# Patient Record
Sex: Female | Born: 1965
Health system: Southern US, Community
[De-identification: ages and names within clinical notes are randomized; demographics above are authoritative.]

## PROBLEM LIST (undated history)

## (undated) DIAGNOSIS — G4733 Obstructive sleep apnea (adult) (pediatric): Secondary | ICD-10-CM

## (undated) DIAGNOSIS — F329 Major depressive disorder, single episode, unspecified: Secondary | ICD-10-CM

## (undated) DIAGNOSIS — J302 Other seasonal allergic rhinitis: Secondary | ICD-10-CM

## (undated) DIAGNOSIS — K219 Gastro-esophageal reflux disease without esophagitis: Secondary | ICD-10-CM

## (undated) DIAGNOSIS — Z8739 Personal history of other diseases of the musculoskeletal system and connective tissue: Secondary | ICD-10-CM

## (undated) DIAGNOSIS — I4891 Unspecified atrial fibrillation: Secondary | ICD-10-CM

## (undated) DIAGNOSIS — F419 Anxiety disorder, unspecified: Secondary | ICD-10-CM

## (undated) DIAGNOSIS — J449 Chronic obstructive pulmonary disease, unspecified: Secondary | ICD-10-CM

## (undated) DIAGNOSIS — M549 Dorsalgia, unspecified: Secondary | ICD-10-CM

## (undated) DIAGNOSIS — I209 Angina pectoris, unspecified: Secondary | ICD-10-CM

## (undated) DIAGNOSIS — M542 Cervicalgia: Secondary | ICD-10-CM

## (undated) DIAGNOSIS — K589 Irritable bowel syndrome without diarrhea: Secondary | ICD-10-CM

## (undated) DIAGNOSIS — M25572 Pain in left ankle and joints of left foot: Secondary | ICD-10-CM

## (undated) DIAGNOSIS — M797 Fibromyalgia: Secondary | ICD-10-CM

## (undated) DIAGNOSIS — I10 Essential (primary) hypertension: Secondary | ICD-10-CM

## (undated) DIAGNOSIS — I499 Cardiac arrhythmia, unspecified: Secondary | ICD-10-CM

## (undated) DIAGNOSIS — R0600 Dyspnea, unspecified: Secondary | ICD-10-CM

## (undated) DIAGNOSIS — M7918 Myalgia, other site: Secondary | ICD-10-CM

## (undated) DIAGNOSIS — M5432 Sciatica, left side: Secondary | ICD-10-CM

## (undated) DIAGNOSIS — T8859XA Other complications of anesthesia, initial encounter: Secondary | ICD-10-CM

## (undated) DIAGNOSIS — D649 Anemia, unspecified: Secondary | ICD-10-CM

## (undated) DIAGNOSIS — Z87442 Personal history of urinary calculi: Secondary | ICD-10-CM

## (undated) DIAGNOSIS — R5382 Chronic fatigue, unspecified: Secondary | ICD-10-CM

## (undated) DIAGNOSIS — F32A Depression, unspecified: Secondary | ICD-10-CM

## (undated) DIAGNOSIS — M069 Rheumatoid arthritis, unspecified: Secondary | ICD-10-CM

## (undated) DIAGNOSIS — R51 Headache: Secondary | ICD-10-CM

## (undated) DIAGNOSIS — R079 Chest pain, unspecified: Secondary | ICD-10-CM

## (undated) DIAGNOSIS — M7750 Other enthesopathy of unspecified foot: Secondary | ICD-10-CM

## (undated) DIAGNOSIS — R7303 Prediabetes: Secondary | ICD-10-CM

## (undated) DIAGNOSIS — R002 Palpitations: Secondary | ICD-10-CM

## (undated) DIAGNOSIS — R519 Headache, unspecified: Secondary | ICD-10-CM

## (undated) DIAGNOSIS — M2550 Pain in unspecified joint: Secondary | ICD-10-CM

## (undated) DIAGNOSIS — F988 Other specified behavioral and emotional disorders with onset usually occurring in childhood and adolescence: Secondary | ICD-10-CM

## (undated) DIAGNOSIS — J45909 Unspecified asthma, uncomplicated: Secondary | ICD-10-CM

## (undated) DIAGNOSIS — G9332 Myalgic encephalomyelitis/chronic fatigue syndrome: Secondary | ICD-10-CM

## (undated) HISTORY — PX: LAPAROSCOPIC CHOLECYSTECTOMY: SUR755

## (undated) HISTORY — DX: Depression, unspecified: F32.A

## (undated) HISTORY — DX: Major depressive disorder, single episode, unspecified: F32.9

## (undated) HISTORY — PX: KNEE ARTHROSCOPY: SUR90

## (undated) HISTORY — PX: TONSILLECTOMY: SUR1361

## (undated) HISTORY — DX: Anemia, unspecified: D64.9

## (undated) HISTORY — DX: Pain in left ankle and joints of left foot: M25.572

## (undated) HISTORY — DX: Palpitations: R00.2

## (undated) HISTORY — DX: Other enthesopathy of unspecified foot and ankle: M77.50

## (undated) HISTORY — DX: Pain in unspecified joint: M25.50

## (undated) HISTORY — DX: Cervicalgia: M54.2

## (undated) HISTORY — DX: Obstructive sleep apnea (adult) (pediatric): G47.33

## (undated) HISTORY — PX: BILATERAL KNEE ARTHROSCOPY: SUR91

## (undated) HISTORY — DX: Myalgia, other site: M79.18

## (undated) HISTORY — PX: CYST EXCISION: SHX5701

## (undated) HISTORY — DX: Unspecified atrial fibrillation: I48.91

## (undated) HISTORY — DX: Anxiety disorder, unspecified: F41.9

## (undated) HISTORY — DX: Dorsalgia, unspecified: M54.9

## (undated) HISTORY — DX: Unspecified asthma, uncomplicated: J45.909

## (undated) HISTORY — PX: KNEE SURGERY: SHX244

## (undated) HISTORY — DX: Chest pain, unspecified: R07.9

## (undated) HISTORY — DX: Dyspnea, unspecified: R06.00

## (undated) HISTORY — DX: Essential (primary) hypertension: I10

## (undated) HISTORY — DX: Fibromyalgia: M79.7

## (undated) HISTORY — PX: CARPAL TUNNEL RELEASE: SHX101

## (undated) HISTORY — DX: Irritable bowel syndrome, unspecified: K58.9

## (undated) HISTORY — DX: Other specified behavioral and emotional disorders with onset usually occurring in childhood and adolescence: F98.8

---

## 2014-12-06 ENCOUNTER — Observation Stay (HOSPITAL_COMMUNITY)
Admission: EM | Admit: 2014-12-06 | Discharge: 2014-12-10 | Disposition: A | Discharge status: Home / Self Care | Payer: Medicare Other | Attending: Internal Medicine | Admitting: Internal Medicine

## 2014-12-06 ENCOUNTER — Encounter (HOSPITAL_COMMUNITY): Payer: Self-pay | Admitting: *Deleted

## 2014-12-06 DIAGNOSIS — Z6838 Body mass index (BMI) 38.0-38.9, adult: Secondary | ICD-10-CM | POA: Insufficient documentation

## 2014-12-06 DIAGNOSIS — Z7982 Long term (current) use of aspirin: Secondary | ICD-10-CM | POA: Diagnosis not present

## 2014-12-06 DIAGNOSIS — E669 Obesity, unspecified: Secondary | ICD-10-CM | POA: Insufficient documentation

## 2014-12-06 DIAGNOSIS — R079 Chest pain, unspecified: Secondary | ICD-10-CM | POA: Diagnosis not present

## 2014-12-06 DIAGNOSIS — Z885 Allergy status to narcotic agent status: Secondary | ICD-10-CM | POA: Diagnosis not present

## 2014-12-06 DIAGNOSIS — Z791 Long term (current) use of non-steroidal anti-inflammatories (NSAID): Secondary | ICD-10-CM | POA: Insufficient documentation

## 2014-12-06 DIAGNOSIS — Z6837 Body mass index (BMI) 37.0-37.9, adult: Secondary | ICD-10-CM | POA: Insufficient documentation

## 2014-12-06 DIAGNOSIS — Z91040 Latex allergy status: Secondary | ICD-10-CM | POA: Insufficient documentation

## 2014-12-06 DIAGNOSIS — D649 Anemia, unspecified: Secondary | ICD-10-CM | POA: Insufficient documentation

## 2014-12-06 DIAGNOSIS — M069 Rheumatoid arthritis, unspecified: Secondary | ICD-10-CM

## 2014-12-06 DIAGNOSIS — Z8249 Family history of ischemic heart disease and other diseases of the circulatory system: Secondary | ICD-10-CM | POA: Diagnosis not present

## 2014-12-06 NOTE — ED Notes (Signed)
PT states that she began to have left sided chest pain approx 20 min ago; pt states that she felt short of breath and the pain radiated to left arm / shoulder area and to the back; pt family gave 1 SL NTG and 325mg  ASA; pt states that the chest pain feels some better now; unsure if was related to the NTG or not; pt denies N/V

## 2014-12-07 ENCOUNTER — Emergency Department (HOSPITAL_COMMUNITY): Payer: Medicare Other

## 2014-12-07 DIAGNOSIS — Z8249 Family history of ischemic heart disease and other diseases of the circulatory system: Secondary | ICD-10-CM | POA: Diagnosis not present

## 2014-12-07 DIAGNOSIS — R079 Chest pain, unspecified: Secondary | ICD-10-CM | POA: Diagnosis not present

## 2014-12-07 DIAGNOSIS — Z791 Long term (current) use of non-steroidal anti-inflammatories (NSAID): Secondary | ICD-10-CM | POA: Diagnosis not present

## 2014-12-07 DIAGNOSIS — Z7982 Long term (current) use of aspirin: Secondary | ICD-10-CM | POA: Diagnosis not present

## 2014-12-07 DIAGNOSIS — R0602 Shortness of breath: Secondary | ICD-10-CM | POA: Diagnosis not present

## 2014-12-07 DIAGNOSIS — M069 Rheumatoid arthritis, unspecified: Secondary | ICD-10-CM

## 2014-12-07 DIAGNOSIS — Z6838 Body mass index (BMI) 38.0-38.9, adult: Secondary | ICD-10-CM | POA: Diagnosis not present

## 2014-12-07 DIAGNOSIS — D649 Anemia, unspecified: Secondary | ICD-10-CM | POA: Diagnosis not present

## 2014-12-07 DIAGNOSIS — R0789 Other chest pain: Secondary | ICD-10-CM

## 2014-12-07 DIAGNOSIS — E669 Obesity, unspecified: Secondary | ICD-10-CM | POA: Diagnosis not present

## 2014-12-07 DIAGNOSIS — Z91040 Latex allergy status: Secondary | ICD-10-CM | POA: Diagnosis not present

## 2014-12-07 DIAGNOSIS — Z885 Allergy status to narcotic agent status: Secondary | ICD-10-CM | POA: Diagnosis not present

## 2014-12-07 LAB — CBC
HCT: 32.2 % — ABNORMAL LOW (ref 36.0–46.0)
HEMATOCRIT: 34.4 % — AB (ref 36.0–46.0)
HEMOGLOBIN: 10.9 g/dL — AB (ref 12.0–15.0)
Hemoglobin: 10.2 g/dL — ABNORMAL LOW (ref 12.0–15.0)
MCH: 26.4 pg (ref 26.0–34.0)
MCH: 26.5 pg (ref 26.0–34.0)
MCHC: 31.7 g/dL (ref 30.0–36.0)
MCHC: 31.7 g/dL (ref 30.0–36.0)
MCV: 83.4 fL (ref 78.0–100.0)
MCV: 83.5 fL (ref 78.0–100.0)
PLATELETS: 271 10*3/uL (ref 150–400)
Platelets: 217 10*3/uL (ref 150–400)
RBC: 3.86 MIL/uL — ABNORMAL LOW (ref 3.87–5.11)
RBC: 4.12 MIL/uL (ref 3.87–5.11)
RDW: 14.6 % (ref 11.5–15.5)
RDW: 14.7 % (ref 11.5–15.5)
WBC: 6.6 10*3/uL (ref 4.0–10.5)
WBC: 7.2 10*3/uL (ref 4.0–10.5)

## 2014-12-07 LAB — BASIC METABOLIC PANEL
ANION GAP: 4 — AB (ref 5–15)
BUN: 20 mg/dL (ref 6–23)
CALCIUM: 8.5 mg/dL (ref 8.4–10.5)
CHLORIDE: 109 meq/L (ref 96–112)
CO2: 27 mmol/L (ref 19–32)
Creatinine, Ser: 0.93 mg/dL (ref 0.50–1.10)
GFR calc Af Amer: 82 mL/min — ABNORMAL LOW (ref >90)
GFR calc non Af Amer: 71 mL/min — ABNORMAL LOW (ref >90)
GLUCOSE: 116 mg/dL — AB (ref 70–99)
Potassium: 3.5 mmol/L (ref 3.5–5.1)
Sodium: 140 mmol/L (ref 135–145)

## 2014-12-07 LAB — TROPONIN I: Troponin I: <0.03 ng/mL (ref <0.031)

## 2014-12-07 LAB — CREATININE, SERUM
Creatinine, Ser: 0.72 mg/dL (ref 0.50–1.10)
GFR calc Af Amer: >90 mL/min (ref >90)

## 2014-12-07 LAB — D-DIMER, QUANTITATIVE: D-Dimer, Quant: 0.38 ug/mL-FEU (ref 0.00–0.48)

## 2014-12-07 LAB — I-STAT TROPONIN, ED: Troponin i, poc: 0.01 ng/mL (ref 0.00–0.08)

## 2014-12-07 MED ORDER — NITROGLYCERIN 2 % TD OINT
0.5000 [in_us] | TOPICAL_OINTMENT | Freq: Three times a day (TID) | TRANSDERMAL | Status: DC
  Administered 2014-12-07 – 2014-12-10 (×10): 0.5 [in_us] via TOPICAL
  Filled 2014-12-07: qty 30

## 2014-12-07 MED ORDER — ONDANSETRON HCL 4 MG/2ML IJ SOLN: 4.0000 mg | Freq: Four times a day (QID) | INTRAMUSCULAR | Status: DC | PRN

## 2014-12-07 MED ORDER — PREGABALIN 75 MG PO CAPS
200.0000 mg | ORAL_CAPSULE | Freq: Every day | ORAL | Status: DC
  Administered 2014-12-07 – 2014-12-10 (×4): 200 mg via ORAL
  Filled 2014-12-07 (×2): qty 1
  Filled 2014-12-07 (×3): qty 2
  Filled 2014-12-07 (×2): qty 1
  Filled 2014-12-07: qty 2

## 2014-12-07 MED ORDER — NABUMETONE 500 MG PO TABS
500.0000 mg | ORAL_TABLET | Freq: Two times a day (BID) | ORAL | Status: DC
  Administered 2014-12-07 – 2014-12-10 (×7): 500 mg via ORAL
  Filled 2014-12-07 (×8): qty 1

## 2014-12-07 MED ORDER — GI COCKTAIL ~~LOC~~
30.0000 mL | Freq: Once | ORAL | Status: AC
  Administered 2014-12-07: 30 mL via ORAL
  Filled 2014-12-07: qty 30

## 2014-12-07 MED ORDER — OXYMETAZOLINE HCL 0.05 % NA SOLN
1.0000 | Freq: Two times a day (BID) | NASAL | Status: DC
  Administered 2014-12-07 – 2014-12-10 (×7): 1 via NASAL
  Filled 2014-12-07: qty 15

## 2014-12-07 MED ORDER — LEFLUNOMIDE 20 MG PO TABS
20.0000 mg | ORAL_TABLET | Freq: Every day | ORAL | Status: DC
  Administered 2014-12-07 – 2014-12-10 (×4): 20 mg via ORAL
  Filled 2014-12-07 (×4): qty 1

## 2014-12-07 MED ORDER — MORPHINE SULFATE 2 MG/ML IJ SOLN
2.0000 mg | Freq: Once | INTRAMUSCULAR | Status: AC
  Administered 2014-12-07: 2 mg via INTRAVENOUS
  Filled 2014-12-07: qty 1

## 2014-12-07 MED ORDER — HEPARIN SODIUM (PORCINE) 5000 UNIT/ML IJ SOLN
5000.0000 [IU] | Freq: Three times a day (TID) | INTRAMUSCULAR | Status: DC
  Administered 2014-12-07 – 2014-12-10 (×10): 5000 [IU] via SUBCUTANEOUS
  Filled 2014-12-07 (×13): qty 1

## 2014-12-07 MED ORDER — ONDANSETRON HCL 4 MG/2ML IJ SOLN
4.0000 mg | Freq: Once | INTRAMUSCULAR | Status: AC
  Administered 2014-12-07: 4 mg via INTRAVENOUS
  Filled 2014-12-07: qty 2

## 2014-12-07 MED ORDER — ZOLPIDEM TARTRATE 5 MG PO TABS
5.0000 mg | ORAL_TABLET | Freq: Every evening | ORAL | Status: DC | PRN
  Filled 2014-12-07: qty 1

## 2014-12-07 MED ORDER — ACETAMINOPHEN 325 MG PO TABS
650.0000 mg | ORAL_TABLET | ORAL | Status: DC | PRN
  Administered 2014-12-07 – 2014-12-10 (×11): 650 mg via ORAL
  Filled 2014-12-07 (×12): qty 2

## 2014-12-07 MED ORDER — NITROGLYCERIN 0.4 MG SL SUBL: 0.4000 mg | SUBLINGUAL_TABLET | SUBLINGUAL | Status: DC | PRN

## 2014-12-07 MED ORDER — SODIUM CHLORIDE 0.9 % IV SOLN
INTRAVENOUS | Status: DC
  Administered 2014-12-07: 02:00:00 via INTRAVENOUS

## 2014-12-07 MED ORDER — ASPIRIN EC 81 MG PO TBEC: 81.0000 mg | DELAYED_RELEASE_TABLET | Freq: Every day | ORAL | Status: DC

## 2014-12-07 MED ORDER — MORPHINE SULFATE 2 MG/ML IJ SOLN
2.0000 mg | INTRAMUSCULAR | Status: DC | PRN
Start: 2014-12-07 — End: 2014-12-10
  Administered 2014-12-07 – 2014-12-10 (×5): 2 mg via INTRAVENOUS
  Filled 2014-12-07 (×6): qty 1

## 2014-12-07 MED ORDER — LORATADINE 10 MG PO TABS
10.0000 mg | ORAL_TABLET | Freq: Every day | ORAL | Status: DC
  Administered 2014-12-07 – 2014-12-10 (×4): 10 mg via ORAL
  Filled 2014-12-07 (×4): qty 1

## 2014-12-07 NOTE — ED Notes (Signed)
Dr. Khan at bedside

## 2014-12-07 NOTE — ED Notes (Signed)
Awake. Verbally responsive. A/O x4. Resp even and unlabored. No audible adventitious breath sounds noted. ABC's intact. SR on monitor at 78 bpm.

## 2014-12-07 NOTE — Progress Notes (Signed)
POC discussion with admitting MD done. New order received: Nitropaste 1'2 inch q 8 hrs . Order to carry out.

## 2014-12-07 NOTE — ED Provider Notes (Signed)
CSN: [id number]     Arrival date & time 12/06/14  2334 History   First MD Initiated Contact with Patient 12/07/14 0017     Chief Complaint  Patient presents with  . Chest Pain     (Consider location/radiation/quality/duration/timing/severity/associated sxs/prior Treatment) Patient is a 49 y.o. female presenting with chest pain. The history is provided by the patient and a relative.  Chest Pain Associated symptoms: back pain and shortness of breath   Associated symptoms: no abdominal pain, no fever, no headache, no nausea and not vomiting    patient with acute onset of chest pain at 11:15 this evening. Left-sided chest sharp in nature radiating to left arm and back. Pain was 10 out of 10 patient took a full aspirin and what some lingual nitroglycerin at home along to a relative and pain improved to 4 out of 10. Not associated with nausea or vomiting. Not associated with fever. Is associated with shortness of breath. Patient with history of similar chest pain 11 years ago but none really since. Family history of premature coronary artery disease. Patient's also been told that she is a borderline hypertensive. Not currently treated.  Past Medical History  Diagnosis Date  . Rheumatism    Past Surgical History  Procedure Laterality Date  . Knee arthroscopy    . Cholecystectomy    . Tonsillectomy    . Cyst excision      to hand   No family history on file. History  Substance Use Topics  . Smoking status: Never Smoker   . Smokeless tobacco: Not on file  . Alcohol Use: No   OB History    No data available     Review of Systems  Constitutional: Negative for fever.  HENT: Negative for congestion.   Eyes: Negative for visual disturbance.  Respiratory: Positive for shortness of breath.   Cardiovascular: Positive for chest pain.  Gastrointestinal: Negative for nausea, vomiting and abdominal pain.  Genitourinary: Negative for dysuria.  Musculoskeletal: Positive for back pain.   Skin: Negative for rash.  Neurological: Negative for headaches.  Hematological: Does not bruise/bleed easily.  Psychiatric/Behavioral: Negative for confusion.      Allergies  Latex and Codeine  Home Medications   Prior to Admission medications   Medication Sig Start Date End Date Taking? Authorizing Provider  cetirizine (ZYRTEC) 10 MG tablet Take 10 mg by mouth daily.   Yes Historical Provider, MD  leflunomide (ARAVA) 20 MG tablet Take 20 mg by mouth daily. 11/19/14  Yes Historical Provider, MD  LYRICA 200 MG capsule Take 1 capsule by mouth daily. 09/06/14  Yes Historical Provider, MD  nabumetone (RELAFEN) 500 MG tablet Take 500 mg by mouth 2 (two) times daily. 11/07/14  Yes Historical Provider, MD   BP 135/86 mmHg  Pulse 93  Temp(Src) 98.4 F (36.9 C) (Oral)  Resp 18  Wt 246 lb (111.585 kg)  SpO2 99%  LMP 10/28/2014 Physical Exam  Constitutional: She is oriented to person, place, and time. She appears well-developed and well-nourished. No distress.  HENT:  Head: Normocephalic and atraumatic.  Mouth/Throat: Oropharynx is clear and moist.  Eyes: Conjunctivae and EOM are normal. Pupils are equal, round, and reactive to light.  Neck: Normal range of motion.  Cardiovascular: Normal rate, regular rhythm and normal heart sounds.   No murmur heard. Pulmonary/Chest: Effort normal and breath sounds normal. She exhibits no tenderness.  Abdominal: Soft. Bowel sounds are normal. There is no tenderness.  Musculoskeletal: Normal range of motion. She exhibits no  edema.  Neurological: She is alert and oriented to person, place, and time. No cranial nerve deficit. She exhibits normal muscle tone.  Skin: Skin is warm. No erythema.  Nursing note and vitals reviewed.   ED Course  Procedures (including critical care time) Labs Review Labs Reviewed  CBC - Abnormal; Notable for the following:    Hemoglobin 10.9 (*)    HCT 34.4 (*)    All other components within normal limits  BASIC  METABOLIC PANEL - Abnormal; Notable for the following:    Glucose, Bld 116 (*)    GFR calc non Af Amer 71 (*)    GFR calc Af Amer 82 (*)    Anion gap 4 (*)    All other components within normal limits  D-DIMER, QUANTITATIVE  I-STAT TROPOININ, ED   Results for orders placed or performed during the hospital encounter of 12/06/14  CBC  Result Value Ref Range   WBC 7.2 4.0 - 10.5 K/uL   RBC 4.12 3.87 - 5.11 MIL/uL   Hemoglobin 10.9 (L) 12.0 - 15.0 g/dL   HCT 34.4 (L) 36.0 - 46.0 %   MCV 83.5 78.0 - 100.0 fL   MCH 26.5 26.0 - 34.0 pg   MCHC 31.7 30.0 - 36.0 g/dL   RDW 14.6 11.5 - 15.5 %   Platelets 271 150 - 400 K/uL  Basic metabolic panel  Result Value Ref Range   Sodium 140 135 - 145 mmol/L   Potassium 3.5 3.5 - 5.1 mmol/L   Chloride 109 96 - 112 mEq/L   CO2 27 19 - 32 mmol/L   Glucose, Bld 116 (H) 70 - 99 mg/dL   BUN 20 6 - 23 mg/dL   Creatinine, Ser 0.93 0.50 - 1.10 mg/dL   Calcium 8.5 8.4 - 10.5 mg/dL   GFR calc non Af Amer 71 (L) >90 mL/min   GFR calc Af Amer 82 (L) >90 mL/min   Anion gap 4 (L) 5 - 15  D-dimer, quantitative  Result Value Ref Range   D-Dimer, Quant 0.38 0.00 - 0.48 ug/mL-FEU  I-stat troponin, ED (not at [facility])  Result Value Ref Range   Troponin i, poc 0.01 0.00 - 0.08 ng/mL   Comment 3             Imaging Review Dg Chest 2 View  12/07/2014   CLINICAL DATA:  Upper chest pain for a few hr radiating to LEFT arm, shortness of breath.  EXAM: CHEST  2 VIEW  COMPARISON:  None.  FINDINGS: Cardiomediastinal silhouette is unremarkable. The lungs are clear without pleural effusions or focal consolidations. Trachea projects midline and there is no pneumothorax. Soft tissue planes and included osseous structures are non-suspicious. Surgical clips in the included right abdomen likely reflect cholecystectomy. Mild degenerative change of thoracic spine. Large body habitus.  IMPRESSION: No acute cardiopulmonary process.   Electronically Signed   By: Elon Alas   On:  12/07/2014 01:02     EKG Interpretation   Date/Time:  Thursday December 06 2014 23:46:58 EST Ventricular Rate:  89 PR Interval:  135 QRS Duration: 84 QT Interval:  349 QTC Calculation: 425 R Axis:   6 Text Interpretation:  Sinus rhythm Borderline T wave abnormalities  Baseline wander in lead(s) II III aVF no previoius Confirmed by Finlee Milo   MD, Joan Herschberger ([id number]) on 12/07/2014 12:23:31 AM      MDM   Final diagnoses:  Chest pain    Patient with acute onset of chest pain at about  the 11:15 in the evening. Left-sided radiated to the left arm and back. Initially was 10 out of 10. Took sublingual nitroglycerin and aspirin at home and pain improved to 4 out of 10. No nausea no vomiting associated with shortness of breath. Patient with a family history of premature coronary artery disease. Brother with MI before age 64.  Workup here chest x-rays negative for pneumonia pneumothorax or pulmonary edema. The d-dimer was negative not likely to be a pulmonary embolus. First troponin was negative. EKG without any acute cardiac changes. Patient is from out of town. Patient had similar chest pain many years ago about 11 years ago. Has not had any recently. Patient will require admission for rule out.    Fredia Sorrow, MD 12/07/14 0201

## 2014-12-07 NOTE — H&P (Signed)
[facility] History and Physical  Dawn Jordan XTK:[id number] DOB: 1966-01-19 DOA: 12/06/2014  Referring physician: Fredia Sorrow, MD PCP: No primary care provider on file.   Chief Complaint: Chest Pain  HPI: Dawn Jordan is a 49 y.o. female presents with chest pain. Patient is visiting from [place]. She states that she has a history of Rheumatism. She noted chest pain that developed around 11pm last night. Patient states that she noticed the pain substernal then it went up her arm and neck and then to the back. Patient states that she does recall shortness of breath. She does not recall any sweating. Patient states that she has had no fevers or chills. She feels the pain is a heaviness. She has had esophagitis in the past and this is not like her esophagitis. She is on chronic NSAIDs for her rheumatism. She denies edema has no syncope and no palpitations.   Review of Systems:  Constitutional:  No weight loss, night sweats, Fevers, chills, fatigue.  HEENT:  No headaches, Difficulty swallowing,Tooth/dental problems  Cardio-vascular:  ++chest pain, no Orthopnea, PND, swelling in lower extremities  GI:  No heartburn, indigestion, abdominal pain, nausea, vomiting, diarrhea  Resp:  No shortness of breath with exertion or at rest. No coughing up of blood.No change in color of mucus.No wheezing Skin:  no rash or lesions GU:  no dysuria, change in color of urine, no urgency or frequency  Musculoskeletal:  No joint pain or swelling. No decreased range of motion  Psych:  No change in mood or affect. No depression or anxiety   Past Medical History  Diagnosis Date  . Rheumatism    Past Surgical History  Procedure Laterality Date  . Knee arthroscopy    . Cholecystectomy    . Tonsillectomy    . Cyst excision      to hand   Social History:  reports that she has never smoked. She does not have any smokeless tobacco history on file. She reports that she does not drink  alcohol or use illicit drugs.  Allergies  Allergen Reactions  . Latex Anaphylaxis, Hives and Itching    "Anaphylaxis is only when I am in a closed area (ex: car with balloons)"  . Codeine Itching    No family history on file.   Prior to Admission medications   Medication Sig Start Date End Date Taking? Authorizing Provider  cetirizine (ZYRTEC) 10 MG tablet Take 10 mg by mouth daily.   Yes Historical Provider, MD  leflunomide (ARAVA) 20 MG tablet Take 20 mg by mouth daily. 11/19/14  Yes Historical Provider, MD  LYRICA 200 MG capsule Take 1 capsule by mouth daily. 09/06/14  Yes Historical Provider, MD  nabumetone (RELAFEN) 500 MG tablet Take 500 mg by mouth 2 (two) times daily. 11/07/14  Yes Historical Provider, MD   Physical Exam: Filed Vitals:   12/06/14 2338 12/07/14 0029  BP: 140/90 135/86  Pulse: 93 93  Temp: 98.4 F (36.9 C)   TempSrc: Oral   Resp: 20 18  Weight: 111.585 kg (246 lb)   SpO2: 100% 99%    Wt Readings from Last 3 Encounters:  12/06/14 111.585 kg (246 lb)    General:  Appears calm and comfortable Eyes: PERRL, normal lids, irises & conjunctiva ENT: grossly normal hearing, lips & tongue Neck: no LAD, masses or thyromegaly Cardiovascular: RRR, no m/r/g. No LE edema. Telemetry: SR, no arrhythmias  Respiratory: CTA bilaterally, no w/r/r. Normal respiratory effort. Abdomen: soft, ntnd Skin: no rash or  induration seen on limited exam Musculoskeletal: grossly normal tone BUE/BLE Psychiatric: grossly normal mood and affect, speech fluent and appropriate Neurologic: grossly non-focal.          Labs on Admission:  Basic Metabolic Panel:  Recent Labs Lab 12/07/14 0003  NA 140  K 3.5  CL 109  CO2 27  GLUCOSE 116*  BUN 20  CREATININE 0.93  CALCIUM 8.5   Liver Function Tests: No results for input(s): AST, ALT, ALKPHOS, BILITOT, PROT, ALBUMIN in the last 168 hours. No results for input(s): LIPASE, AMYLASE in the last 168 hours. No results for  input(s): AMMONIA in the last 168 hours. CBC:  Recent Labs Lab 12/07/14 0003  WBC 7.2  HGB 10.9*  HCT 34.4*  MCV 83.5  PLT 271   Cardiac Enzymes: No results for input(s): CKTOTAL, CKMB, CKMBINDEX, TROPONINI in the last 168 hours.  BNP (last 3 results) No results for input(s): PROBNP in the last 8760 hours. CBG: No results for input(s): GLUCAP in the last 168 hours.  Radiological Exams on Admission: Dg Chest 2 View  12/07/2014   CLINICAL DATA:  Upper chest pain for a few hr radiating to LEFT arm, shortness of breath.  EXAM: CHEST  2 VIEW  COMPARISON:  None.  FINDINGS: Cardiomediastinal silhouette is unremarkable. The lungs are clear without pleural effusions or focal consolidations. Trachea projects midline and there is no pneumothorax. Soft tissue planes and included osseous structures are non-suspicious. Surgical clips in the included right abdomen likely reflect cholecystectomy. Mild degenerative change of thoracic spine. Large body habitus.  IMPRESSION: No acute cardiopulmonary process.   Electronically Signed   By: Elon Alas   On: 12/07/2014 01:02      Assessment/Plan Active Problems:   Chest pain   1. Chest Pain -will admit for observation -will check serial enzymes -possible GI source will give GI cocktail now -will consider stress test can be done as outpatient if she rules out  2. Anemia -will check stool occult blood -will check iron studies     Code Status: Full Code (must indicate code status--if unknown or must be presumed, indicate so) DVT Prophylaxis:heparin Family Communication: none (indicate person spoken with, if applicable, with phone number if by telephone) Disposition Plan: Home (indicate anticipated LOS)  Time spent: 22min  KHAN,SAADAT A [facility] Pager [phone]

## 2014-12-07 NOTE — Progress Notes (Signed)
Received bedside report from ED nurse.

## 2014-12-07 NOTE — ED Notes (Signed)
MD at bedside. 

## 2014-12-07 NOTE — Progress Notes (Signed)
UR completed 

## 2014-12-07 NOTE — Consult Note (Signed)
CARDIOLOGY CONSULT NOTE   Patient ID: Dawn Jordan MRN: [id number], DOB/AGE: Apr 02, 1966   Admit date: 12/06/2014 Date of Consult: 12/07/2014   Primary Physician: No primary care provider on file. Primary Cardiologist: None. Lives in [place].  Pt. Profile  49 year old admitted with chest pain.  Problem List  Past Medical History  Diagnosis Date  . Rheumatism     Past Surgical History  Procedure Laterality Date  . Knee arthroscopy    . Cholecystectomy    . Tonsillectomy    . Cyst excision      to hand     Allergies  Allergies  Allergen Reactions  . Latex Anaphylaxis, Hives and Itching    "Anaphylaxis is only when I am in a closed area (ex: car with balloons)"  . Codeine Itching    HPI   49 year old woman from [facility] here visiting friends for New Years. Developed chest pain last evening at rest. Took one of her mother's NTG with no significant benefit. No prior history of heart problems. Had negative stress test about 11 years ago.  Nonsmoker. Not diabetic. No significant medical problems except for rheumatoid arthritis.  Inpatient Medications  . heparin  5,000 Units Subcutaneous 3 times per day  . leflunomide  20 mg Oral Daily  . loratadine  10 mg Oral Daily  . nabumetone  500 mg Oral BID  . nitroGLYCERIN  0.5 inch Topical 3 times per day  . pregabalin  200 mg Oral Daily    Family History No family history on file. Patient's mother has had strokes.  Social History History   Social History  . Marital Status: Legally Separated    Spouse Name: N/A    Number of Children: N/A  . Years of Education: N/A   Occupational History  . Not on file.   Social History Main Topics  . Smoking status: Never Smoker   . Smokeless tobacco: Not on file  . Alcohol Use: No  . Drug Use: No  . Sexual Activity: Not on file   Other Topics Concern  . Not on file   Social History Narrative  . No narrative on file     Review of Systems  General:  No chills, fever,  night sweats or weight changes.  Cardiovascular:  No chest pain, dyspnea on exertion, edema, orthopnea, palpitations, paroxysmal nocturnal dyspnea. Dermatological: No rash, lesions/masses Respiratory: No cough, dyspnea Urologic: No hematuria, dysuria Abdominal:   No nausea, vomiting, diarrhea, bright red blood per rectum, melena, or hematemesis Neurologic:  No visual changes, wkns, changes in mental status. All other systems reviewed and are otherwise negative except as noted above.  Physical Exam  Blood pressure 110/61, pulse 78, temperature 97.6 F (36.4 C), temperature source Axillary, resp. rate 18, height 5\' 5"  (1.651 m), weight 230 lb 2.6 oz (104.4 kg), last menstrual period 10/28/2014, SpO2 97 %.  General: Pleasant, NAD.  Obese. Psych: Normal affect. Neuro: Alert and oriented X 3. Moves all extremities spontaneously. HEENT: Normal  Neck: Supple without bruits or JVD. Lungs:  Resp regular and unlabored, CTA. Heart: RRR no s3, s4, or murmurs. Abdomen: Soft, non-tender, non-distended, BS + x 4.  Extremities: No clubbing, cyanosis or edema. DP/PT/Radials 2+ and equal bilaterally.  Labs   Recent Labs  12/07/14 0525 12/07/14 0650  TROPONINI <0.03 <0.03   Lab Results  Component Value Date   WBC 6.6 12/07/2014   HGB 10.2* 12/07/2014   HCT 32.2* 12/07/2014   MCV 83.4  12/07/2014   PLT 217 12/07/2014     Recent Labs Lab 12/07/14 0003 12/07/14 0525  NA 140  --   K 3.5  --   CL 109  --   CO2 27  --   BUN 20  --   CREATININE 0.93 0.72  CALCIUM 8.5  --   GLUCOSE 116*  --    No results found for: CHOL, HDL, LDLCALC, TRIG Lab Results  Component Value Date   DDIMER 0.38 12/07/2014    Radiology/Studies  Dg Chest 2 View  12/07/2014   CLINICAL DATA:  Upper chest pain for a few hr radiating to LEFT arm, shortness of breath.  EXAM: CHEST  2 VIEW  COMPARISON:  None.  FINDINGS: Cardiomediastinal silhouette is unremarkable. The lungs are clear without pleural effusions or  focal consolidations. Trachea projects midline and there is no pneumothorax. Soft tissue planes and included osseous structures are non-suspicious. Surgical clips in the included right abdomen likely reflect cholecystectomy. Mild degenerative change of thoracic spine. Large body habitus.  IMPRESSION: No acute cardiopulmonary process.   Electronically Signed   By: Elon Alas   On: 12/07/2014 01:02    ECG Sinus rhythm Borderline T wave abnormalities Baseline wander in lead(s) II III aVF no previoius Confirmed by [place]  1. Chest pain uncertain etiology. Possible coronary spasm but may also be noncardiac. EKG shows no ischemic changes. Initial cardiac enzymes are normal. Exam is unremarkable. 2. Rheumatoid arthritis  3. Anemia.  Recommend.  Would get one more troponin and if negative could consider discharge today and she can follow up with her physician in [facility]. Could get a myoview there. She will need 2 day protocol because of body habitus. She is anxious for discharge from the hospital. Could discharge on baby ASA and prn SL NTG until results of stress test are known.   Signed, Darlin Coco, MD  12/07/2014, 10:24 AM

## 2014-12-07 NOTE — ED Notes (Signed)
Awake. Verbally responsive. A/O x4. Resp even and unlabored. No audible adventitious breath sounds noted. ABC's intact. SR on monitor at 81bpm. Family at bedside.

## 2014-12-07 NOTE — Discharge Summary (Signed)
Physician Discharge Summary  Dawn Jordan PNT:[id number] DOB: 11-10-1966 DOA: 12/06/2014  PCP: No primary care provider on file.  Admit date: 12/06/2014 Discharge date: 12/07/2014  Time spent: 40 minutes  Recommendations for Outpatient Follow-up:  1. Myoview stress test   Discharge Condition: stable Diet recommendation: heart healthy  Discharge Diagnoses:  Principal Problem:   Chest pain Active Problems:   Chronic rheumatic arthritis   History of present illness:  Dawn Jordan is a 49 y.o. female presents with chest pain. Patient is visiting from [place]. She states that she has a history of rheumatoid arthritis. She noted chest pain that developed around 11pm last night. Patient states that she noticed the pain substernal then it went up her arm and neck and then to the back. Patient states that she does recall shortness of breath. She does not recall any sweating. Patient states that she has had no fevers or chills. She feels the pain is a heaviness. She has had esophagitis in the past and this is not like her esophagitis. She is on chronic NSAIDs for her rheumatism. She denies edema has no syncope and no palpitations.  Hospital Course:  Chest pain - Troponin x 3 and EKG normal - cardiology has advised baby ASA and PRN Nitro until Myoview stress test can be performed - pt prefers to leave today and have myoview done at a later date  Anemia - outpt work up  Procedures:  none (i.e. Studies not automatically included, echos, thoracentesis, etc; not x-rays)  Consultations:  Cardiology - [facility]- Dr Mare Ferrari  Discharge Exam: [facility] Weights   12/06/14 2338 12/07/14 0331  Weight: 111.585 kg (246 lb) 104.4 kg (230 lb 2.6 oz)   Filed Vitals:   12/07/14 0614  BP: 110/61  Pulse: 78  Temp: 97.6 F (36.4 C)  Resp: 18    General: AAO x 3, no distress Cardiovascular: RRR, no murmurs  Respiratory: clear to auscultation bilaterally GI: soft, non-tender, non-distended,  bowel sound positive  Discharge Instructions You were cared for by a hospitalist during your hospital Jordan. If you have any questions about your discharge medications or the care you received while you were in the hospital after you are discharged, you can call the unit and asked to speak with the hospitalist on call if the hospitalist that took care of you is not available. Once you are discharged, your primary care physician will handle any further medical issues. Please note that NO REFILLS for any discharge medications will be authorized once you are discharged, as it is imperative that you return to your primary care physician (or establish a relationship with a primary care physician if you do not have one) for your aftercare needs so that they can reassess your need for medications and monitor your lab values.     Medication List    TAKE these medications        aspirin EC 81 MG tablet  Take 1 tablet (81 mg total) by mouth daily.     cetirizine 10 MG tablet  Commonly known as:  ZYRTEC  Take 10 mg by mouth daily.     leflunomide 20 MG tablet  Commonly known as:  ARAVA  Take 20 mg by mouth daily.     LYRICA 200 MG capsule  Generic drug:  pregabalin  Take 1 capsule by mouth daily.     nabumetone 500 MG tablet  Commonly known as:  RELAFEN  Take 500 mg by mouth 2 (two) times daily.  nitroGLYCERIN 0.4 MG SL tablet  Commonly known as:  NITROSTAT  Place 1 tablet (0.4 mg total) under the tongue every 5 (five) minutes as needed for chest pain.       Allergies  Allergen Reactions  . Latex Anaphylaxis, Hives and Itching    "Anaphylaxis is only when I am in a closed area (ex: car with balloons)"  . Codeine Itching      The results of significant diagnostics from this hospitalization (including imaging, microbiology, ancillary and laboratory) are listed below for reference.    Significant Diagnostic Studies: Dg Chest 2 View  12/07/2014   CLINICAL DATA:  Upper chest pain  for a few hr radiating to LEFT arm, shortness of breath.  EXAM: CHEST  2 VIEW  COMPARISON:  None.  FINDINGS: Cardiomediastinal silhouette is unremarkable. The lungs are clear without pleural effusions or focal consolidations. Trachea projects midline and there is no pneumothorax. Soft tissue planes and included osseous structures are non-suspicious. Surgical clips in the included right abdomen likely reflect cholecystectomy. Mild degenerative change of thoracic spine. Large body habitus.  IMPRESSION: No acute cardiopulmonary process.   Electronically Signed   By: Elon Alas   On: 12/07/2014 01:02    Microbiology: No results found for this or any previous visit (from the past 240 hour(s)).   Labs: Basic Metabolic Panel:  Recent Labs Lab 12/07/14 0003 12/07/14 0525  NA 140  --   K 3.5  --   CL 109  --   CO2 27  --   GLUCOSE 116*  --   BUN 20  --   CREATININE 0.93 0.72  CALCIUM 8.5  --    Liver Function Tests: No results for input(s): AST, ALT, ALKPHOS, BILITOT, PROT, ALBUMIN in the last 168 hours. No results for input(s): LIPASE, AMYLASE in the last 168 hours. No results for input(s): AMMONIA in the last 168 hours. CBC:  Recent Labs Lab 12/07/14 0003 12/07/14 0525  WBC 7.2 6.6  HGB 10.9* 10.2*  HCT 34.4* 32.2*  MCV 83.5 83.4  PLT 271 217   Cardiac Enzymes:  Recent Labs Lab 12/07/14 0525 12/07/14 0650 12/07/14 0959  TROPONINI <0.03 <0.03 <0.03   BNP: BNP (last 3 results) No results for input(s): PROBNP in the last 8760 hours. CBG: No results for input(s): GLUCAP in the last 168 hours.     SignedDebbe Odea, MD [facility] 12/07/2014, 12:07 PM

## 2014-12-08 DIAGNOSIS — R079 Chest pain, unspecified: Secondary | ICD-10-CM | POA: Diagnosis not present

## 2014-12-08 DIAGNOSIS — R0789 Other chest pain: Secondary | ICD-10-CM | POA: Diagnosis not present

## 2014-12-08 DIAGNOSIS — I517 Cardiomegaly: Secondary | ICD-10-CM

## 2014-12-08 DIAGNOSIS — M069 Rheumatoid arthritis, unspecified: Secondary | ICD-10-CM | POA: Diagnosis not present

## 2014-12-08 MED ORDER — REGADENOSON 0.4 MG/5ML IV SOLN
0.4000 mg | Freq: Once | INTRAVENOUS | Status: DC
  Filled 2014-12-08: qty 5

## 2014-12-08 NOTE — Progress Notes (Signed)
Echocardiogram 2D Echocardiogram has been performed.  Dawn Jordan 12/08/2014, 11:39 AM

## 2014-12-08 NOTE — Progress Notes (Signed)
Verbal order given to this rn by MD to cancel pt's discharge for today. Done. Pt is aware. Vwilliams,rn.

## 2014-12-08 NOTE — Progress Notes (Signed)
PROGRESS NOTE  Subjective:   49 yo with admission for severe chest pressure. + family hx of premature CAD. troponins negative so far.   Objective:    Vital Signs:   Temp:  [97.4 F (36.3 C)-98.2 F (36.8 C)] 97.4 F (36.3 C) (01/02 0534) Pulse Rate:  [67-70] 67 (01/02 0534) Resp:  [16-19] 19 (01/02 0534) BP: (110-120)/(64-69) 110/64 mmHg (01/02 0534) SpO2:  [97 %] 97 % (01/02 0534)  Last BM Date: 12/06/14   24-hour weight change: Weight change:   Weight trends: Filed Weights   12/06/14 2338 12/07/14 0331  Weight: 246 lb (111.585 kg) 230 lb 2.6 oz (104.4 kg)    Intake/Output:  01/01 0701 - 01/02 0700 In: 960 [P.O.:960] Out: -      Physical Exam: BP 110/64 mmHg  Pulse 67  Temp(Src) 97.4 F (36.3 C) (Oral)  Resp 19  Ht 5\' 5"  (1.651 m)  Wt 230 lb 2.6 oz (104.4 kg)  BMI 38.30 kg/m2  SpO2 97%  LMP 10/28/2014  Wt Readings from Last 3 Encounters:  12/07/14 230 lb 2.6 oz (104.4 kg)    General: Vital signs reviewed and noted.   Head: Normocephalic, atraumatic.  Eyes: conjunctivae/corneas clear.  EOM's intact.   Throat: normal  Neck:  thick , normal   Lungs:    clear   Heart:   RR , normal s1s2  Abdomen:  Soft, non-tender, non-distended    Extremities: No edema    Neurologic: A&O X3, CN II - XII are grossly intact.   Psych: Normal     Labs: BMET:  Recent Labs  12/07/14 0003 12/07/14 0525  NA 140  --   K 3.5  --   CL 109  --   CO2 27  --   GLUCOSE 116*  --   BUN 20  --   CREATININE 0.93 0.72  CALCIUM 8.5  --     Liver function tests: No results for input(s): AST, ALT, ALKPHOS, BILITOT, PROT, ALBUMIN in the last 72 hours. No results for input(s): LIPASE, AMYLASE in the last 72 hours.  CBC:  Recent Labs  12/07/14 0003 12/07/14 0525  WBC 7.2 6.6  HGB 10.9* 10.2*  HCT 34.4* 32.2*  MCV 83.5 83.4  PLT 271 217    Cardiac Enzymes:  Recent Labs  12/07/14 0525 12/07/14 0650 12/07/14 0959  TROPONINI <0.03 <0.03 <0.03     Coagulation Studies: No results for input(s): LABPROT, INR in the last 72 hours.  Other: Invalid input(s): POCBNP  Recent Labs  12/07/14 0003  DDIMER 0.38   No results for input(s): HGBA1C in the last 72 hours. No results for input(s): CHOL, HDL, LDLCALC, TRIG, CHOLHDL in the last 72 hours. No results for input(s): TSH, T4TOTAL, T3FREE, THYROIDAB in the last 72 hours.  Invalid input(s): FREET3 No results for input(s): VITAMINB12, FOLATE, FERRITIN, TIBC, IRON, RETICCTPCT in the last 72 hours.   Other results:  EKG :   NSR , no ST or T wave changes.   Medications:    Infusions:    Scheduled Medications: . heparin  5,000 Units Subcutaneous 3 times per day  . leflunomide  20 mg Oral Daily  . loratadine  10 mg Oral Daily  . nabumetone  500 mg Oral BID  . nitroGLYCERIN  0.5 inch Topical 3 times per day  . oxymetazoline  1 spray Each Nare BID  . pregabalin  200 mg Oral Daily  . [START ON 12/09/2014] regadenoson  0.4 mg  Intravenous Once    Assessment/ Plan:   Principal Problem:   Chest pain Active Problems:   Chronic rheumatic arthritis   1. Chest pressure.  Worrisome for CAD.  + family hx of CAD at early age - brother had MI at age 22. Obese, not very active. Will get a 2 day Lexiscan myoview study. Unfortunately, she has eaten today and we will have to do the study tomorrow and on Monday.    Disposition:   Length of Stay: 2  Ramond Dial., MD, [facility] 12/08/2014, 8:50 AM Office [phone] Pager [phone]

## 2014-12-08 NOTE — Progress Notes (Signed)
[facility] Progress Note   Dawn Jordan [id number]:[id number] DOB: [birth year] DOA: 12/06/2014 PCP: No primary care provider on file.  Brief narrative: Dawn Jordan is a 49 y.o. female with chest pain   Subjective: On and off chest pain.   Assessment/Plan: Principal Problem:   Chest pain - patient was due to be discharged yesterday as recommendations were made by cardiology to have outpt stress myoview - however, she was not discharged due to ongoing on and off chest pain - cardiology has decided today to perform a myoview in house- this will be a 2 day study set to start tomorrow - cardiac enzymes and EKG normal  Active Problems:   Chronic rheumatic arthritis - cont meds for this    Code Status: full code Family Communication:  Disposition Plan: home once stable DVT prophylaxis: heparin  Consultants: cardiology  Procedures: none  Antibiotics: Anti-infectives    None         Objective: [facility] Weights   12/06/14 2338 12/07/14 0331  Weight: 111.585 kg (246 lb) 104.4 kg (230 lb 2.6 oz)    Intake/Output Summary (Last 24 hours) at 12/08/14 1020 Last data filed at 12/08/14 0900  Gross per 24 hour  Intake    960 ml  Output      0 ml  Net    960 ml     Vitals Filed Vitals:   12/07/14 0614 12/07/14 1343 12/07/14 2224 12/08/14 0534  BP: 110/61 117/69 120/66 110/64  Pulse: 78 70 68 67  Temp: 97.6 F (36.4 C) 98.2 F (36.8 C) 97.9 F (36.6 C) 97.4 F (36.3 C)  TempSrc: Axillary Oral Oral Oral  Resp: 18 19 16 19   Height:      Weight:      SpO2: 97% 97% 97% 97%    Exam: General: AAO x 3,  No acute respiratory distress Lungs: Clear to auscultation bilaterally without wheezes or crackles Cardiovascular: Regular rate and rhythm without murmur gallop or rub normal S1 and S2 Abdomen: Nontender, nondistended, soft, bowel sounds positive, no rebound, no ascites, no appreciable mass Extremities: No significant cyanosis, clubbing, or edema bilateral lower  extremities  Data Reviewed: Basic Metabolic Panel:  Recent Labs Lab 12/07/14 0003 12/07/14 0525  NA 140  --   K 3.5  --   CL 109  --   CO2 27  --   GLUCOSE 116*  --   BUN 20  --   CREATININE 0.93 0.72  CALCIUM 8.5  --    Liver Function Tests: No results for input(s): AST, ALT, ALKPHOS, BILITOT, PROT, ALBUMIN in the last 168 hours. No results for input(s): LIPASE, AMYLASE in the last 168 hours. No results for input(s): AMMONIA in the last 168 hours. CBC:  Recent Labs Lab 12/07/14 0003 12/07/14 0525  WBC 7.2 6.6  HGB 10.9* 10.2*  HCT 34.4* 32.2*  MCV 83.5 83.4  PLT 271 217   Cardiac Enzymes:  Recent Labs Lab 12/07/14 0525 12/07/14 0650 12/07/14 0959  TROPONINI <0.03 <0.03 <0.03   BNP (last 3 results) No results for input(s): PROBNP in the last 8760 hours. CBG: No results for input(s): GLUCAP in the last 168 hours.  No results found for this or any previous visit (from the past 240 hour(s)).   Studies:  Recent x-ray studies have been reviewed in detail by the Attending Physician  Scheduled Meds:  Scheduled Meds: . heparin  5,000 Units Subcutaneous 3 times per day  . leflunomide  20 mg Oral Daily  .  loratadine  10 mg Oral Daily  . nabumetone  500 mg Oral BID  . nitroGLYCERIN  0.5 inch Topical 3 times per day  . oxymetazoline  1 spray Each Nare BID  . pregabalin  200 mg Oral Daily  . [START ON 12/09/2014] regadenoson  0.4 mg Intravenous Once   Continuous Infusions:   Time spent on care of this patient: 30 min   [place], MD 12/08/2014, 10:20 AM  LOS: 2 days   [facility] Office  [phone] Pager - Text Page per [email or web address]  If 7PM-7AM, please contact night-coverage [email or web address]

## 2014-12-08 NOTE — Progress Notes (Signed)
UR completed 

## 2014-12-09 ENCOUNTER — Ambulatory Visit (HOSPITAL_COMMUNITY)
Admit: 2014-12-09 | Discharge: 2014-12-09 | Disposition: A | Discharge status: Home / Self Care | Payer: Medicare Other | Source: Home / Self Care | Attending: Cardiovascular Disease | Admitting: Cardiovascular Disease

## 2014-12-09 ENCOUNTER — Other Ambulatory Visit: Payer: Self-pay

## 2014-12-09 DIAGNOSIS — R079 Chest pain, unspecified: Secondary | ICD-10-CM

## 2014-12-09 DIAGNOSIS — M069 Rheumatoid arthritis, unspecified: Secondary | ICD-10-CM | POA: Diagnosis not present

## 2014-12-09 LAB — IRON AND TIBC
Iron: 35 ug/dL — ABNORMAL LOW (ref 42–145)
SATURATION RATIOS: 13 % — AB (ref 20–55)
TIBC: 270 ug/dL (ref 250–470)
UIBC: 235 ug/dL (ref 125–400)

## 2014-12-09 LAB — OCCULT BLOOD X 1 CARD TO LAB, STOOL: Fecal Occult Bld: NEGATIVE

## 2014-12-09 LAB — FERRITIN: Ferritin: 136 ng/mL (ref 10–291)

## 2014-12-09 MED ORDER — TECHNETIUM TC 99M SESTAMIBI GENERIC - CARDIOLITE: 30.0000 | Freq: Once | INTRAVENOUS | Status: AC | PRN

## 2014-12-09 MED ORDER — REGADENOSON 0.4 MG/5ML IV SOLN
INTRAVENOUS | Status: AC
  Filled 2014-12-09: qty 5

## 2014-12-09 MED ORDER — HYDROCORTISONE 0.5 % EX CREA
TOPICAL_CREAM | Freq: Two times a day (BID) | CUTANEOUS | Status: DC
  Administered 2014-12-09 (×2): via TOPICAL
  Filled 2014-12-09: qty 28.35

## 2014-12-09 MED ORDER — REGADENOSON 0.4 MG/5ML IV SOLN
0.4000 mg | Freq: Once | INTRAVENOUS | Status: AC
  Administered 2014-12-09: 0.4 mg via INTRAVENOUS

## 2014-12-09 NOTE — Progress Notes (Signed)
Pt noted to have a 6cm irregulare, red, itchy, raised rash on her L posterior arm pit.  States she noted it on Saturday afternoon.  It is dry, no vesicles and has not changed in the past 12 hours.  No other rashes noted.   Pt is asking for hydrocortisone cream. I have notified [place] on call.

## 2014-12-09 NOTE — Progress Notes (Signed)
UR completed 

## 2014-12-09 NOTE — Progress Notes (Signed)
[facility] Progress Note   Dawn Jordan [id number]:[id number] DOB: 04-02-1966 DOA: 12/06/2014 PCP: No primary care provider on file.  Brief narrative: Dawn Jordan is a 49 y.o. female with chest pain  Subjective: Having chest pain this AM when she was told that she was being transferred to [facility] for a cath.   Assessment/Plan: Principal Problem:   Chest pain - patient was due to be discharged as recommendations were made by cardiology to have outpt stress myoview - however, she was not discharged due to ongoing on and off chest pain - cardiology has decided today to perform a myoview in house- this will be a 2 day study  - cardiac enzymes and EKG normal  Active Problems:   Chronic rheumatic arthritis - cont meds for this    Code Status: full code Family Communication:  Disposition Plan: home once stable DVT prophylaxis: heparin  Consultants: cardiology  Procedures: none  Antibiotics: Anti-infectives    None         Objective: [facility] Weights   12/06/14 2338 12/07/14 0331  Weight: 111.585 kg (246 lb) 104.4 kg (230 lb 2.6 oz)    Intake/Output Summary (Last 24 hours) at 12/09/14 1127 Last data filed at 12/09/14 0910  Gross per 24 hour  Intake   1215 ml  Output   1000 ml  Net    215 ml     Vitals Filed Vitals:   12/08/14 1418 12/08/14 2100 12/09/14 0455 12/09/14 1100  BP: 117/62 131/94 105/67 103/77  Pulse: 78 80 67 83  Temp: 97.8 F (36.6 C) 97.9 F (36.6 C) 97.7 F (36.5 C) 98 F (36.7 C)  TempSrc: Oral Oral Oral   Resp: 18 18 18 20   Height:      Weight:      SpO2: 99% 98% 98% 98%    Exam: General: AAO x 3,  No acute respiratory distress Lungs: Clear to auscultation bilaterally without wheezes or crackles Cardiovascular: Regular rate and rhythm without murmur gallop or rub normal S1 and S2 Abdomen: Nontender, nondistended, soft, bowel sounds positive, no rebound, no ascites, no appreciable mass Extremities: No significant cyanosis,  clubbing, or edema bilateral lower extremities  Data Reviewed: Basic Metabolic Panel:  Recent Labs Lab 12/07/14 0003 12/07/14 0525  NA 140  --   K 3.5  --   CL 109  --   CO2 27  --   GLUCOSE 116*  --   BUN 20  --   CREATININE 0.93 0.72  CALCIUM 8.5  --    Liver Function Tests: No results for input(s): AST, ALT, ALKPHOS, BILITOT, PROT, ALBUMIN in the last 168 hours. No results for input(s): LIPASE, AMYLASE in the last 168 hours. No results for input(s): AMMONIA in the last 168 hours. CBC:  Recent Labs Lab 12/07/14 0003 12/07/14 0525  WBC 7.2 6.6  HGB 10.9* 10.2*  HCT 34.4* 32.2*  MCV 83.5 83.4  PLT 271 217   Cardiac Enzymes:  Recent Labs Lab 12/07/14 0525 12/07/14 0650 12/07/14 0959  TROPONINI <0.03 <0.03 <0.03   BNP (last 3 results) No results for input(s): PROBNP in the last 8760 hours. CBG: No results for input(s): GLUCAP in the last 168 hours.  No results found for this or any previous visit (from the past 240 hour(s)).   Studies:  Recent x-ray studies have been reviewed in detail by the Attending Physician  Scheduled Meds:  Scheduled Meds: . heparin  5,000 Units Subcutaneous 3 times per day  . hydrocortisone cream  Topical BID  . leflunomide  20 mg Oral Daily  . loratadine  10 mg Oral Daily  . nabumetone  500 mg Oral BID  . nitroGLYCERIN  0.5 inch Topical 3 times per day  . oxymetazoline  1 spray Each Nare BID  . pregabalin  200 mg Oral Daily  . regadenoson  0.4 mg Intravenous Once   Continuous Infusions:   Time spent on care of this patient: 30 min   [place], MD 12/09/2014, 11:27 AM  LOS: 3 days   [facility] Office  [phone] Pager - Text Page per [email or web address]  If 7PM-7AM, please contact night-coverage [email or web address]

## 2014-12-09 NOTE — Progress Notes (Signed)
No complaints of chest pain this shift but continues to have headache 7/10 since starting NTG.  Pain decreases to 3/10 with pain med.

## 2014-12-09 NOTE — Progress Notes (Signed)
Day 1 (stress) of 2 day lexiscan stress test completed. Some diffuse TWI shortly after injecting lexiscan, however quickly resolved on subsequent EKG. Pending resting image tomorrow  Signed, Almyra Deforest PA Pager: [phone]

## 2014-12-09 NOTE — Progress Notes (Signed)
PROGRESS NOTE  Subjective:   49 yo with admission for severe chest pressure. + family hx of premature CAD. troponins negative so far.  Objective:    Vital Signs:   Temp:  [97.7 F (36.5 C)-97.9 F (36.6 C)] 97.7 F (36.5 C) (01/03 0455) Pulse Rate:  [67-80] 67 (01/03 0455) Resp:  [18] 18 (01/03 0455) BP: (105-131)/(62-94) 105/67 mmHg (01/03 0455) SpO2:  [98 %-99 %] 98 % (01/03 0455)  Last BM Date: 12/08/14   24-hour weight change: Weight change:   Weight trends: Filed Weights   12/06/14 2338 12/07/14 0331  Weight: 246 lb (111.585 kg) 230 lb 2.6 oz (104.4 kg)    Intake/Output:  01/02 0701 - 01/03 0700 In: 1448 [P.O.:1455] Out: 1000 [Urine:1000]     Physical Exam: BP 105/67 mmHg  Pulse 67  Temp(Src) 97.7 F (36.5 C) (Oral)  Resp 18  Ht 5\' 5"  (1.651 m)  Wt 230 lb 2.6 oz (104.4 kg)  BMI 38.30 kg/m2  SpO2 98%  LMP 10/28/2014  Wt Readings from Last 3 Encounters:  12/07/14 230 lb 2.6 oz (104.4 kg)   General: Vital signs reviewed and noted.   Head: Normocephalic, atraumatic.  Eyes: conjunctivae/corneas clear. EOM's intact.   Throat: normal  Neck: thick , normal   Lungs:   clear   Heart:  RR , normal s1s2  Abdomen:  Soft, non-tender, non-distended   Extremities: No edema   Neurologic: A&O X3, CN II - XII are grossly intact.   Psych: Normal      Labs: BMET:  Recent Labs  12/07/14 0003 12/07/14 0525  NA 140  --   K 3.5  --   CL 109  --   CO2 27  --   GLUCOSE 116*  --   BUN 20  --   CREATININE 0.93 0.72  CALCIUM 8.5  --     Liver function tests: No results for input(s): AST, ALT, ALKPHOS, BILITOT, PROT, ALBUMIN in the last 72 hours. No results for input(s): LIPASE, AMYLASE in the last 72 hours.  CBC:  Recent Labs  12/07/14 0003 12/07/14 0525  WBC 7.2 6.6  HGB 10.9* 10.2*  HCT 34.4* 32.2*  MCV 83.5 83.4  PLT 271 217    Cardiac Enzymes:  Recent Labs  12/07/14 0525 12/07/14 0650  12/07/14 0959  TROPONINI <0.03 <0.03 <0.03    Coagulation Studies: No results for input(s): LABPROT, INR in the last 72 hours.  Other: Invalid input(s): POCBNP  Recent Labs  12/07/14 0003  DDIMER 0.38   No results for input(s): HGBA1C in the last 72 hours. No results for input(s): CHOL, HDL, LDLCALC, TRIG, CHOLHDL in the last 72 hours. No results for input(s): TSH, T4TOTAL, T3FREE, THYROIDAB in the last 72 hours.  Invalid input(s): FREET3 No results for input(s): VITAMINB12, FOLATE, FERRITIN, TIBC, IRON, RETICCTPCT in the last 72 hours.   Other results:  Tele:  NSR    Medications:    Infusions:    Scheduled Medications: . heparin  5,000 Units Subcutaneous 3 times per day  . hydrocortisone cream   Topical BID  . leflunomide  20 mg Oral Daily  . loratadine  10 mg Oral Daily  . nabumetone  500 mg Oral BID  . nitroGLYCERIN  0.5 inch Topical 3 times per day  . oxymetazoline  1 spray Each Nare BID  . pregabalin  200 mg Oral Daily  . regadenoson  0.4 mg Intravenous Once    Assessment/ Plan:  Principal Problem:  Chest pain Active Problems:  Chronic rheumatic arthritis   1. Chest pressure. Worrisome for CAD. + family hx of CAD at early age - brother had MI at age 69. Obese, not very active. Will get a 2 day Lexiscan myoview study.  She should be able to go tomorrow if myoview is normal.   Disposition:  Length of Stay: 3  Thayer Headings, Brooke Bonito., MD, [facility] 12/09/2014, 8:26 AM Office [phone] Pager [phone]

## 2014-12-10 ENCOUNTER — Observation Stay (HOSPITAL_COMMUNITY)
Admit: 2014-12-10 | Discharge: 2014-12-10 | Disposition: A | Discharge status: Home / Self Care | Payer: Medicare Other | Attending: Cardiovascular Disease | Admitting: Cardiovascular Disease

## 2014-12-10 ENCOUNTER — Ambulatory Visit (HOSPITAL_COMMUNITY)
Admit: 2014-12-10 | Discharge: 2014-12-10 | Disposition: A | Discharge status: Home / Self Care | Payer: Medicare Other | Attending: Cardiovascular Disease | Admitting: Cardiovascular Disease

## 2014-12-10 DIAGNOSIS — M069 Rheumatoid arthritis, unspecified: Secondary | ICD-10-CM | POA: Diagnosis not present

## 2014-12-10 DIAGNOSIS — R079 Chest pain, unspecified: Secondary | ICD-10-CM | POA: Diagnosis not present

## 2014-12-10 DIAGNOSIS — Z6837 Body mass index (BMI) 37.0-37.9, adult: Secondary | ICD-10-CM | POA: Insufficient documentation

## 2014-12-10 MED ORDER — TECHNETIUM TC 99M SESTAMIBI GENERIC - CARDIOLITE
30.0000 | Freq: Once | INTRAVENOUS | Status: AC | PRN
  Administered 2014-12-10: 30 via INTRAVENOUS

## 2014-12-10 NOTE — Progress Notes (Addendum)
Subjective:  No further CP. To have day 2 of myoview today  Objective:   Vital Signs in the last 24 hours: Temp:  [97.4 F (36.3 C)-98 F (36.7 C)] 97.7 F (36.5 C) (01/04 0623) Pulse Rate:  [66-99] 71 (01/04 0623) Resp:  [18-20] 20 (01/04 0623) BP: (100-133)/(59-81) 118/74 mmHg (01/04 0623) SpO2:  [98 %-100 %] 98 % (01/04 0623)  Intake/Output from previous day: 01/03 0701 - 01/04 0700 In: 240 [P.O.:240] Out: -  Net: +240  I/O since admission: +1655  Medications: . heparin  5,000 Units Subcutaneous 3 times per day  . hydrocortisone cream   Topical BID  . leflunomide  20 mg Oral Daily  . loratadine  10 mg Oral Daily  . nabumetone  500 mg Oral BID  . nitroGLYCERIN  0.5 inch Topical 3 times per day  . oxymetazoline  1 spray Each Nare BID  . pregabalin  200 mg Oral Daily  . regadenoson  0.4 mg Intravenous Once       Physical Exam:   General appearance: alert, cooperative and no distress Neck: no adenopathy, no carotid bruit, no JVD, supple, symmetrical, trachea midline and thyroid not enlarged, symmetric, no tenderness/mass/nodules Lungs: clear to auscultation bilaterally Heart: regular rate and rhythm, S1, S2 normal, no murmur, click, rub or gallop Abdomen: soft, non-tender; bowel sounds normal; no masses,  no organomegaly Extremities: no edema, redness or tenderness in the calves or thighs Skin: Skin color, texture, turgor normal. No rashes or lesions or mild erythemous rash left posterior upper arm  Neurologic: Grossly normal   Rate: 80  Rhythm: normal sinus rhythm  ECG (independently read by me):  Lab Results:  BMP Latest Ref Rng 12/07/2014 12/07/2014  Glucose 70 - 99 mg/dL - 116(H)  BUN 6 - 23 mg/dL - 20  Creatinine 0.50 - 1.10 mg/dL 0.72 0.93  Sodium 135 - 145 mmol/L - 140  Potassium 3.5 - 5.1 mmol/L - 3.5  Chloride 96 - 112 mEq/L - 109  CO2 19 - 32 mmol/L - 27  Calcium 8.4 - 10.5 mg/dL - 8.5     CBC Latest Ref Rng 12/07/2014 12/07/2014  WBC 4.0 -  10.5 K/uL 6.6 7.2  Hemoglobin 12.0 - 15.0 g/dL 10.2(L) 10.9(L)  Hematocrit 36.0 - 46.0 % 32.2(L) 34.4(L)  Platelets 150 - 400 K/uL 217 271      Recent Labs  12/07/14 0959  TROPONINI <0.03    Hepatic Function Panel No results for input(s): PROT, ALBUMIN, AST, ALT, ALKPHOS, BILITOT, BILIDIR, IBILI in the last 72 hours. No results for input(s): INR in the last 72 hours. BNP (last 3 results) No results for input(s): PROBNP in the last 8760 hours.  Lipid Panel  No results found for: CHOL, TRIG, HDL, CHOLHDL, VLDL, LDLCALC, LDLDIRECT    Imaging:  ------------------------------------------------------------------- ECHO Study Conclusions  - Procedure narrative: Transthoracic echocardiography. Image quality was suboptimal. The study was technically difficult, as a result of poor acoustic windows and body habitus. - Left ventricle: The cavity size was normal. There was moderate to severe concentric hypertrophy. Systolic function was normal. The estimated ejection fraction was in the range of 55% to 60%. Left ventricular diastolic function parameters were normal. - Mitral valve: There was trivial regurgitation. - Tricuspid valve: There was trivial regurgitation  Assessment/Plan:   Principal Problem:   Chest pain Active Problems:   Chronic rheumatic arthritis   Pain in the chest   No further chest pain.  Echo data reviewed with patient. For day 2  of myoview. If normal or low risk probably can dc later today. If significant ischemia, will need definitive cath.  ?May need topical HC for arm rash from  [facility]. Weight loss. Monitor lipids.   Troy Sine, MD, [facility] 12/10/2014, 7:23 AM

## 2014-12-10 NOTE — Progress Notes (Signed)
Pt becoming very anxious and agitated, requesting to be discharged, stating her friends are having to care for her elderly mother while she is in the hospital. Carlton Adam results available. Texted Dr. Wynelle Cleveland to notify of the above.  Coolidge Breeze, RN 12/10/2014

## 2014-12-10 NOTE — Progress Notes (Signed)
UR completed 

## 2014-12-10 NOTE — Discharge Summary (Signed)
Physician Discharge Summary  Angee Gupton KZL:[id number] DOB: 01-May-1966 DOA: 12/06/2014  PCP: No primary care provider on file.  Admit date: 12/06/2014 Discharge date: 12/10/2014  Time spent: 45 minutes  Recommendations for Outpatient Follow-up:  1. If pain continues, f/u with PCP   Discharge Condition: stable Diet recommendation: heart healthy  Discharge Diagnoses:  Principal Problem:   Chest pain Active Problems:   Chronic rheumatic arthritis   Pain in the chest   Obesity (BMI 35.0-39.9 without comorbidity)   History of present illness:  Dawn Jordan is a 49 y.o. female with chest pain.  Patient is visiting from [place]. She states that she has a history of Rheumatism. She noted chest pain that developed around 11pm last night. Patient states that she noticed the pain substernal then it went up her arm and neck and then to the back. Patient states that she does recall shortness of breath. She does not recall any sweating. Patient states that she has had no fevers or chills. She feels the pain is a heaviness. She has had esophagitis in the past and this is not like her esophagitis. She is on chronic NSAIDs for her rheumatism. She denies edema has no syncope and no palpitations.  Hospital Course:  Principal Problem:  Chest pain - patient was due to be discharged as recommendations were made by cardiology to have outpt stress myoview - however, she was not discharged due to ongoing on and off chest pain - cardiology has decided today to perform a myoview in house- this reveals findings as below suggestive of reversible defect- discussed with cardiology - Dr Ellouise Newer is suspecting that this defect is due to breast attenuation- it is recommended to discharge her but if her chest pain continues, to have her f/u with her PCP or cardiology back home- I have discussed this with the patient prior to d/c - cardiac enzymes and EKG normal  Active Problems:  Chronic rheumatic  arthritis - cont meds for this  Procedures:  myoview stress test  Consultations:  cardiology  Discharge Exam: [facility] Weights   12/06/14 2338 12/07/14 0331  Weight: 111.585 kg (246 lb) 104.4 kg (230 lb 2.6 oz)   Filed Vitals:   12/10/14 0623  BP: 118/74  Pulse: 71  Temp: 97.7 F (36.5 C)  Resp: 20    General: AAO x 3, no distress Cardiovascular: RRR, no murmurs  Respiratory: clear to auscultation bilaterally GI: soft, non-tender, non-distended, bowel sound positive  Discharge Instructions You were cared for by a hospitalist during your hospital stay. If you have any questions about your discharge medications or the care you received while you were in the hospital after you are discharged, you can call the unit and asked to speak with the hospitalist on call if the hospitalist that took care of you is not available. Once you are discharged, your primary care physician will handle any further medical issues. Please note that NO REFILLS for any discharge medications will be authorized once you are discharged, as it is imperative that you return to your primary care physician (or establish a relationship with a primary care physician if you do not have one) for your aftercare needs so that they can reassess your need for medications and monitor your lab values.  Discharge Instructions    Diet - low sodium heart healthy    Complete by:  As directed      Diet - low sodium heart healthy    Complete by:  As directed  Discharge instructions    Complete by:  As directed   F/u with family doctor in regards to chest pain and equivocal myoview stress test.     Increase activity slowly    Complete by:  As directed      Increase activity slowly    Complete by:  As directed             Medication List    TAKE these medications        aspirin EC 81 MG tablet  Take 1 tablet (81 mg total) by mouth daily.     cetirizine 10 MG tablet  Commonly known as:  ZYRTEC  Take 10 mg by  mouth daily.     leflunomide 20 MG tablet  Commonly known as:  ARAVA  Take 20 mg by mouth daily.     LYRICA 200 MG capsule  Generic drug:  pregabalin  Take 1 capsule by mouth daily.     nabumetone 500 MG tablet  Commonly known as:  RELAFEN  Take 500 mg by mouth 2 (two) times daily.     nitroGLYCERIN 0.4 MG SL tablet  Commonly known as:  NITROSTAT  Place 1 tablet (0.4 mg total) under the tongue every 5 (five) minutes as needed for chest pain.       Allergies  Allergen Reactions  . Latex Anaphylaxis, Hives and Itching    "Anaphylaxis is only when I am in a closed area (ex: car with balloons)"  . Codeine Itching      The results of significant diagnostics from this hospitalization (including imaging, microbiology, ancillary and laboratory) are listed below for reference.    Significant Diagnostic Studies: Dg Chest 2 View  12/07/2014   CLINICAL DATA:  Upper chest pain for a few hr radiating to LEFT arm, shortness of breath.  EXAM: CHEST  2 VIEW  COMPARISON:  None.  FINDINGS: Cardiomediastinal silhouette is unremarkable. The lungs are clear without pleural effusions or focal consolidations. Trachea projects midline and there is no pneumothorax. Soft tissue planes and included osseous structures are non-suspicious. Surgical clips in the included right abdomen likely reflect cholecystectomy. Mild degenerative change of thoracic spine. Large body habitus.  IMPRESSION: No acute cardiopulmonary process.   Electronically Signed   By: Elon Alas   On: 12/07/2014 01:02   Nm Myocar Multi W/spect W/wall Motion / Ef  12/10/2014   CLINICAL DATA:  Chest pain  EXAM: MYOCARDIAL IMAGING WITH SPECT (REST AND PHARMACOLOGIC-STRESS)  GATED LEFT VENTRICULAR WALL MOTION STUDY  LEFT VENTRICULAR EJECTION FRACTION  TECHNIQUE: Standard myocardial SPECT imaging was performed after resting intravenous injection of 10 mCi Tc-82m sestamibi. Subsequently, intravenous infusion of Lexiscan was performed under the  supervision of the Cardiology staff. At peak effect of the drug, 30 mCi Tc-28m sestamibi was injected intravenously and standard myocardial SPECT imaging was performed. Quantitative gated imaging was also performed to evaluate left ventricular wall motion, and estimate left ventricular ejection fraction.  COMPARISON:  None.  FINDINGS: Baseline EKG: NSR with no ST changes. During Lexiscan infusion patient complained of SOB and had mild diffuse T wave inversions in the inferolateral leads.  RAW images show mild breast and diaphragmatic attenuation artifact.  Perfusion: Small, mild intensity reversible defect in the mid and apical anterior wall. This most likely represents shifting breast attenuation but cannot rule out a small area of ischemia.  Wall Motion: Normal left ventricular wall motion. No left ventricular dilation.  Left Ventricular Ejection Fraction: 63 %  End diastolic volume  77 ml  End systolic volume 29 ml  IMPRESSION: 1. Small, mild intensity reversible defect in the mid and apical anterior wall. This most likely represents shifting breast attenuation artifact but cannot rule out a small area of ischemia. The patient did have diffuse T wave inversions during Lexiscan infusion.  2. Normal left ventricular wall motion.  3. Left ventricular ejection fraction 63%  4. Intermediate-risk stress test findings*.  *2012 Appropriate Use Criteria for Coronary Revascularization Focused Update: J Am Coll Cardiol. 2957;47(3):403-709. http://content.airportbarriers.com.aspx?articleid=1201161   Electronically Signed   By: Fransico Him   On: 12/10/2014 15:03    Microbiology: No results found for this or any previous visit (from the past 240 hour(s)).   Labs: Basic Metabolic Panel:  Recent Labs Lab 12/07/14 0003 12/07/14 0525  NA 140  --   K 3.5  --   CL 109  --   CO2 27  --   GLUCOSE 116*  --   BUN 20  --   CREATININE 0.93 0.72  CALCIUM 8.5  --    Liver Function Tests: No results for input(s):  AST, ALT, ALKPHOS, BILITOT, PROT, ALBUMIN in the last 168 hours. No results for input(s): LIPASE, AMYLASE in the last 168 hours. No results for input(s): AMMONIA in the last 168 hours. CBC:  Recent Labs Lab 12/07/14 0003 12/07/14 0525  WBC 7.2 6.6  HGB 10.9* 10.2*  HCT 34.4* 32.2*  MCV 83.5 83.4  PLT 271 217   Cardiac Enzymes:  Recent Labs Lab 12/07/14 0525 12/07/14 0650 12/07/14 0959  TROPONINI <0.03 <0.03 <0.03   BNP: BNP (last 3 results) No results for input(s): PROBNP in the last 8760 hours. CBG: No results for input(s): GLUCAP in the last 168 hours.     SignedDebbe Odea, MD [facility] 12/10/2014, 5:44 PM

## 2014-12-12 DIAGNOSIS — Z79899 Other long term (current) drug therapy: Secondary | ICD-10-CM | POA: Diagnosis not present

## 2014-12-12 DIAGNOSIS — M797 Fibromyalgia: Secondary | ICD-10-CM | POA: Diagnosis not present

## 2014-12-12 DIAGNOSIS — M150 Primary generalized (osteo)arthritis: Secondary | ICD-10-CM | POA: Diagnosis not present

## 2014-12-12 DIAGNOSIS — M0609 Rheumatoid arthritis without rheumatoid factor, multiple sites: Secondary | ICD-10-CM | POA: Diagnosis not present

## 2014-12-19 DIAGNOSIS — R931 Abnormal findings on diagnostic imaging of heart and coronary circulation: Secondary | ICD-10-CM | POA: Diagnosis not present

## 2014-12-19 DIAGNOSIS — R079 Chest pain, unspecified: Secondary | ICD-10-CM | POA: Diagnosis not present

## 2014-12-20 ENCOUNTER — Ambulatory Visit: Payer: Medicare Other | Admitting: Internal Medicine

## 2014-12-26 DIAGNOSIS — F902 Attention-deficit hyperactivity disorder, combined type: Secondary | ICD-10-CM | POA: Diagnosis not present

## 2014-12-26 DIAGNOSIS — F331 Major depressive disorder, recurrent, moderate: Secondary | ICD-10-CM | POA: Diagnosis not present

## 2014-12-26 DIAGNOSIS — F4310 Post-traumatic stress disorder, unspecified: Secondary | ICD-10-CM | POA: Diagnosis not present

## 2015-01-02 DIAGNOSIS — L240 Irritant contact dermatitis due to detergents: Secondary | ICD-10-CM | POA: Diagnosis not present

## 2015-01-03 DIAGNOSIS — R911 Solitary pulmonary nodule: Secondary | ICD-10-CM | POA: Diagnosis not present

## 2015-01-03 DIAGNOSIS — R931 Abnormal findings on diagnostic imaging of heart and coronary circulation: Secondary | ICD-10-CM | POA: Diagnosis not present

## 2015-01-03 DIAGNOSIS — R072 Precordial pain: Secondary | ICD-10-CM | POA: Diagnosis not present

## 2015-01-29 DIAGNOSIS — R918 Other nonspecific abnormal finding of lung field: Secondary | ICD-10-CM | POA: Diagnosis not present

## 2015-01-29 DIAGNOSIS — R911 Solitary pulmonary nodule: Secondary | ICD-10-CM | POA: Diagnosis not present

## 2015-02-05 DIAGNOSIS — M1712 Unilateral primary osteoarthritis, left knee: Secondary | ICD-10-CM | POA: Diagnosis not present

## 2015-02-05 DIAGNOSIS — M1711 Unilateral primary osteoarthritis, right knee: Secondary | ICD-10-CM | POA: Diagnosis not present

## 2015-02-13 DIAGNOSIS — Z79899 Other long term (current) drug therapy: Secondary | ICD-10-CM | POA: Diagnosis not present

## 2015-02-13 DIAGNOSIS — M150 Primary generalized (osteo)arthritis: Secondary | ICD-10-CM | POA: Diagnosis not present

## 2015-02-13 DIAGNOSIS — M25572 Pain in left ankle and joints of left foot: Secondary | ICD-10-CM | POA: Diagnosis not present

## 2015-02-13 DIAGNOSIS — M797 Fibromyalgia: Secondary | ICD-10-CM | POA: Diagnosis not present

## 2015-02-13 DIAGNOSIS — M0609 Rheumatoid arthritis without rheumatoid factor, multiple sites: Secondary | ICD-10-CM | POA: Diagnosis not present

## 2015-02-21 DIAGNOSIS — F331 Major depressive disorder, recurrent, moderate: Secondary | ICD-10-CM | POA: Diagnosis not present

## 2015-02-21 DIAGNOSIS — F4310 Post-traumatic stress disorder, unspecified: Secondary | ICD-10-CM | POA: Diagnosis not present

## 2015-02-21 DIAGNOSIS — F902 Attention-deficit hyperactivity disorder, combined type: Secondary | ICD-10-CM | POA: Diagnosis not present

## 2015-04-25 DIAGNOSIS — Z79899 Other long term (current) drug therapy: Secondary | ICD-10-CM | POA: Diagnosis not present

## 2015-04-25 DIAGNOSIS — M797 Fibromyalgia: Secondary | ICD-10-CM | POA: Diagnosis not present

## 2015-04-25 DIAGNOSIS — M0609 Rheumatoid arthritis without rheumatoid factor, multiple sites: Secondary | ICD-10-CM | POA: Diagnosis not present

## 2015-04-25 DIAGNOSIS — F331 Major depressive disorder, recurrent, moderate: Secondary | ICD-10-CM | POA: Diagnosis not present

## 2015-04-25 DIAGNOSIS — M150 Primary generalized (osteo)arthritis: Secondary | ICD-10-CM | POA: Diagnosis not present

## 2015-04-25 DIAGNOSIS — F902 Attention-deficit hyperactivity disorder, combined type: Secondary | ICD-10-CM | POA: Diagnosis not present

## 2015-04-25 DIAGNOSIS — F4310 Post-traumatic stress disorder, unspecified: Secondary | ICD-10-CM | POA: Diagnosis not present

## 2015-06-25 DIAGNOSIS — M1712 Unilateral primary osteoarthritis, left knee: Secondary | ICD-10-CM | POA: Diagnosis not present

## 2015-06-25 DIAGNOSIS — M1711 Unilateral primary osteoarthritis, right knee: Secondary | ICD-10-CM | POA: Diagnosis not present

## 2015-06-25 DIAGNOSIS — M545 Low back pain: Secondary | ICD-10-CM | POA: Diagnosis not present

## 2015-06-25 DIAGNOSIS — M4310 Spondylolisthesis, site unspecified: Secondary | ICD-10-CM | POA: Diagnosis not present

## 2015-07-10 DIAGNOSIS — F331 Major depressive disorder, recurrent, moderate: Secondary | ICD-10-CM | POA: Diagnosis not present

## 2015-07-10 DIAGNOSIS — F902 Attention-deficit hyperactivity disorder, combined type: Secondary | ICD-10-CM | POA: Diagnosis not present

## 2015-07-10 DIAGNOSIS — F4310 Post-traumatic stress disorder, unspecified: Secondary | ICD-10-CM | POA: Diagnosis not present

## 2015-09-10 DIAGNOSIS — F902 Attention-deficit hyperactivity disorder, combined type: Secondary | ICD-10-CM | POA: Diagnosis not present

## 2015-09-10 DIAGNOSIS — F331 Major depressive disorder, recurrent, moderate: Secondary | ICD-10-CM | POA: Diagnosis not present

## 2015-09-10 DIAGNOSIS — F4310 Post-traumatic stress disorder, unspecified: Secondary | ICD-10-CM | POA: Diagnosis not present

## 2015-10-15 DIAGNOSIS — Z79899 Other long term (current) drug therapy: Secondary | ICD-10-CM | POA: Diagnosis not present

## 2015-10-15 DIAGNOSIS — M0609 Rheumatoid arthritis without rheumatoid factor, multiple sites: Secondary | ICD-10-CM | POA: Diagnosis not present

## 2015-10-15 DIAGNOSIS — M25511 Pain in right shoulder: Secondary | ICD-10-CM | POA: Diagnosis not present

## 2015-10-15 DIAGNOSIS — M150 Primary generalized (osteo)arthritis: Secondary | ICD-10-CM | POA: Diagnosis not present

## 2015-10-15 DIAGNOSIS — M170 Bilateral primary osteoarthritis of knee: Secondary | ICD-10-CM | POA: Diagnosis not present

## 2015-11-19 DIAGNOSIS — M1712 Unilateral primary osteoarthritis, left knee: Secondary | ICD-10-CM | POA: Diagnosis not present

## 2015-11-19 DIAGNOSIS — F902 Attention-deficit hyperactivity disorder, combined type: Secondary | ICD-10-CM | POA: Diagnosis not present

## 2015-11-19 DIAGNOSIS — M1711 Unilateral primary osteoarthritis, right knee: Secondary | ICD-10-CM | POA: Diagnosis not present

## 2015-11-19 DIAGNOSIS — F331 Major depressive disorder, recurrent, moderate: Secondary | ICD-10-CM | POA: Diagnosis not present

## 2015-11-19 DIAGNOSIS — F4310 Post-traumatic stress disorder, unspecified: Secondary | ICD-10-CM | POA: Diagnosis not present

## 2015-12-23 DIAGNOSIS — M25561 Pain in right knee: Secondary | ICD-10-CM | POA: Diagnosis not present

## 2015-12-23 DIAGNOSIS — S83422A Sprain of lateral collateral ligament of left knee, initial encounter: Secondary | ICD-10-CM | POA: Diagnosis not present

## 2015-12-23 DIAGNOSIS — S8392XA Sprain of unspecified site of left knee, initial encounter: Secondary | ICD-10-CM | POA: Diagnosis not present

## 2015-12-23 DIAGNOSIS — M25562 Pain in left knee: Secondary | ICD-10-CM | POA: Diagnosis not present

## 2016-01-09 DIAGNOSIS — F4323 Adjustment disorder with mixed anxiety and depressed mood: Secondary | ICD-10-CM | POA: Diagnosis not present

## 2016-02-12 DIAGNOSIS — M797 Fibromyalgia: Secondary | ICD-10-CM | POA: Diagnosis not present

## 2016-02-12 DIAGNOSIS — M170 Bilateral primary osteoarthritis of knee: Secondary | ICD-10-CM | POA: Diagnosis not present

## 2016-02-12 DIAGNOSIS — M150 Primary generalized (osteo)arthritis: Secondary | ICD-10-CM | POA: Diagnosis not present

## 2016-02-12 DIAGNOSIS — M0609 Rheumatoid arthritis without rheumatoid factor, multiple sites: Secondary | ICD-10-CM | POA: Diagnosis not present

## 2016-02-17 DIAGNOSIS — M1711 Unilateral primary osteoarthritis, right knee: Secondary | ICD-10-CM | POA: Diagnosis not present

## 2016-02-17 DIAGNOSIS — M069 Rheumatoid arthritis, unspecified: Secondary | ICD-10-CM | POA: Diagnosis not present

## 2016-02-17 DIAGNOSIS — M1712 Unilateral primary osteoarthritis, left knee: Secondary | ICD-10-CM | POA: Diagnosis not present

## 2016-02-17 DIAGNOSIS — M25561 Pain in right knee: Secondary | ICD-10-CM | POA: Diagnosis not present

## 2016-02-17 DIAGNOSIS — D179 Benign lipomatous neoplasm, unspecified: Secondary | ICD-10-CM | POA: Diagnosis not present

## 2016-02-17 DIAGNOSIS — M0609 Rheumatoid arthritis without rheumatoid factor, multiple sites: Secondary | ICD-10-CM | POA: Diagnosis not present

## 2016-02-17 DIAGNOSIS — M25562 Pain in left knee: Secondary | ICD-10-CM | POA: Diagnosis not present

## 2016-03-24 DIAGNOSIS — D1721 Benign lipomatous neoplasm of skin and subcutaneous tissue of right arm: Secondary | ICD-10-CM | POA: Diagnosis not present

## 2016-03-25 DIAGNOSIS — F4323 Adjustment disorder with mixed anxiety and depressed mood: Secondary | ICD-10-CM | POA: Diagnosis not present

## 2016-05-20 DIAGNOSIS — F4310 Post-traumatic stress disorder, unspecified: Secondary | ICD-10-CM | POA: Diagnosis not present

## 2016-05-20 DIAGNOSIS — F331 Major depressive disorder, recurrent, moderate: Secondary | ICD-10-CM | POA: Diagnosis not present

## 2016-05-20 DIAGNOSIS — F902 Attention-deficit hyperactivity disorder, combined type: Secondary | ICD-10-CM | POA: Diagnosis not present

## 2016-06-19 DIAGNOSIS — M170 Bilateral primary osteoarthritis of knee: Secondary | ICD-10-CM | POA: Diagnosis not present

## 2016-06-19 DIAGNOSIS — M150 Primary generalized (osteo)arthritis: Secondary | ICD-10-CM | POA: Diagnosis not present

## 2016-06-19 DIAGNOSIS — Z79899 Other long term (current) drug therapy: Secondary | ICD-10-CM | POA: Diagnosis not present

## 2016-06-19 DIAGNOSIS — M0609 Rheumatoid arthritis without rheumatoid factor, multiple sites: Secondary | ICD-10-CM | POA: Diagnosis not present

## 2016-06-19 DIAGNOSIS — M797 Fibromyalgia: Secondary | ICD-10-CM | POA: Diagnosis not present

## 2016-09-21 ENCOUNTER — Encounter: Payer: Self-pay | Admitting: Family Medicine

## 2016-09-21 ENCOUNTER — Ambulatory Visit (INDEPENDENT_AMBULATORY_CARE_PROVIDER_SITE_OTHER): Payer: Medicare Other | Admitting: Family Medicine

## 2016-09-21 VITALS — BP 120/80 | HR 83 | Temp 98.1°F | Resp 12 | Ht 65.0 in | Wt 227.0 lb

## 2016-09-21 DIAGNOSIS — F909 Attention-deficit hyperactivity disorder, unspecified type: Secondary | ICD-10-CM | POA: Insufficient documentation

## 2016-09-21 DIAGNOSIS — Z6837 Body mass index (BMI) 37.0-37.9, adult: Secondary | ICD-10-CM

## 2016-09-21 DIAGNOSIS — D1721 Benign lipomatous neoplasm of skin and subcutaneous tissue of right arm: Secondary | ICD-10-CM

## 2016-09-21 DIAGNOSIS — F4310 Post-traumatic stress disorder, unspecified: Secondary | ICD-10-CM

## 2016-09-21 DIAGNOSIS — M797 Fibromyalgia: Secondary | ICD-10-CM | POA: Diagnosis not present

## 2016-09-21 DIAGNOSIS — I499 Cardiac arrhythmia, unspecified: Secondary | ICD-10-CM

## 2016-09-21 HISTORY — DX: Post-traumatic stress disorder, unspecified: F43.10

## 2016-09-21 NOTE — Patient Instructions (Signed)
A few things to remember from today's visit:   Benign lipomatous neoplasm of skin, subcu of right arm - Plan: Ambulatory referral to General Surgery  Fibromyalgia syndrome  BMI 37.0-37.9, adult  Attention deficit hyperactivity disorder (ADHD), unspecified ADHD type  PTSD (post-traumatic stress disorder)  Irregular heart rate  Please establish with psychiatrist to continue following on psychiatric issues. Appointment with surgery will be arrange. Continue following with orthopedist and rheumatologist.   He needs to set up appointment for gynecologic preventative visit.  EKG in the past showed irregular heart rate, not concerning at this time.   Please be sure medication list is accurate. If a new problem present, please set up appointment sooner than planned today.

## 2016-09-21 NOTE — Progress Notes (Signed)
Pre visit review using our clinic review tool, if applicable. No additional management support is needed unless otherwise documented below in the visit note. 
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
## 2016-09-22 ENCOUNTER — Encounter: Payer: Self-pay | Admitting: Family Medicine

## 2016-09-24 DIAGNOSIS — M0609 Rheumatoid arthritis without rheumatoid factor, multiple sites: Secondary | ICD-10-CM | POA: Diagnosis not present

## 2016-09-24 DIAGNOSIS — R5382 Chronic fatigue, unspecified: Secondary | ICD-10-CM | POA: Diagnosis not present

## 2016-09-24 DIAGNOSIS — M2550 Pain in unspecified joint: Secondary | ICD-10-CM | POA: Diagnosis not present

## 2016-09-24 DIAGNOSIS — M7989 Other specified soft tissue disorders: Secondary | ICD-10-CM | POA: Diagnosis not present

## 2016-09-24 DIAGNOSIS — M797 Fibromyalgia: Secondary | ICD-10-CM | POA: Diagnosis not present

## 2016-10-05 DIAGNOSIS — D1721 Benign lipomatous neoplasm of skin and subcutaneous tissue of right arm: Secondary | ICD-10-CM | POA: Diagnosis not present

## 2016-10-08 DIAGNOSIS — M797 Fibromyalgia: Secondary | ICD-10-CM | POA: Diagnosis not present

## 2016-10-08 DIAGNOSIS — M2550 Pain in unspecified joint: Secondary | ICD-10-CM | POA: Diagnosis not present

## 2016-10-08 DIAGNOSIS — M0609 Rheumatoid arthritis without rheumatoid factor, multiple sites: Secondary | ICD-10-CM | POA: Diagnosis not present

## 2016-10-08 DIAGNOSIS — M7989 Other specified soft tissue disorders: Secondary | ICD-10-CM | POA: Diagnosis not present

## 2016-10-08 DIAGNOSIS — R5382 Chronic fatigue, unspecified: Secondary | ICD-10-CM | POA: Diagnosis not present

## 2016-10-23 DIAGNOSIS — M1712 Unilateral primary osteoarthritis, left knee: Secondary | ICD-10-CM | POA: Diagnosis not present

## 2016-10-23 DIAGNOSIS — M1711 Unilateral primary osteoarthritis, right knee: Secondary | ICD-10-CM | POA: Diagnosis not present

## 2016-10-23 DIAGNOSIS — M170 Bilateral primary osteoarthritis of knee: Secondary | ICD-10-CM | POA: Diagnosis not present

## 2016-12-03 ENCOUNTER — Ambulatory Visit (INDEPENDENT_AMBULATORY_CARE_PROVIDER_SITE_OTHER): Payer: Medicare Other | Admitting: Family Medicine

## 2016-12-03 ENCOUNTER — Encounter: Payer: Self-pay | Admitting: Family Medicine

## 2016-12-03 VITALS — BP 122/74 | HR 94 | Temp 99.1°F | Wt 229.4 lb

## 2016-12-03 DIAGNOSIS — M791 Myalgia, unspecified site: Secondary | ICD-10-CM

## 2016-12-03 DIAGNOSIS — R6889 Other general symptoms and signs: Secondary | ICD-10-CM | POA: Diagnosis not present

## 2016-12-03 LAB — POCT INFLUENZA A: Rapid Influenza A Ag: NEGATIVE

## 2016-12-03 MED ORDER — OSELTAMIVIR PHOSPHATE 75 MG PO CAPS: 75.0000 mg | ORAL_CAPSULE | Freq: Two times a day (BID) | ORAL | 0 refills | Status: DC

## 2016-12-03 NOTE — Progress Notes (Signed)
Pre visit review using our clinic review tool, if applicable. No additional management support is needed unless otherwise documented below in the visit note. 

## 2016-12-03 NOTE — Progress Notes (Signed)
Subjective:    Patient ID: Dawn Jordan, female    DOB: 1966/10/24, 50 y.o.   MRN: [id number]  HPI  Ms. Moise is a 50 year old female who presents today acutely ill with fever, headache and myalgias that started one day.  Associated chills, fever, sweats, "scratchy throat", cough that is nonproductive, and fatigue. She denies N/V/D, sinus pressure/pain, nasal congestion, tooth pain, or ear pain/pressure. History of RA and Fibromyalgia; she is followed by [facility] rheumatology. She did not take her Enbrel due to her symptoms yesterday. No recent sick contact exposure. No recent antibiotic use. She is not a smoker. Treatment at home with Dayquil which has provided limited benefit. No history of asthma/bronchitis  Review of Systems  Constitutional: Positive for chills and fever.  HENT: Positive for sore throat. Negative for congestion, ear pain, rhinorrhea, sinus pressure and sneezing.   Respiratory: Positive for cough. Negative for shortness of breath and wheezing.   Cardiovascular: Negative for chest pain and palpitations.  Gastrointestinal: Negative for abdominal pain, diarrhea, nausea and vomiting.  Musculoskeletal: Positive for myalgias.  Neurological: Negative for dizziness, light-headedness, numbness and headaches.   Past Medical History:  Diagnosis Date  . Allergy   . Anxiety   . Arthritis   . Asthma   . Depression   . Rheumatism      Social History   Social History  . Marital status: Legally Separated    Spouse name: N/A  . Number of children: N/A  . Years of education: N/A   Occupational History  . Not on file.   Social History Main Topics  . Smoking status: Never Smoker  . Smokeless tobacco: Not on file  . Alcohol use No  . Drug use: No  . Sexual activity: Not on file   Other Topics Concern  . Not on file   Social History Narrative  . No narrative on file    Past Surgical History:  Procedure Laterality Date  . CHOLECYSTECTOMY    . CYST  EXCISION     to hand  . KNEE ARTHROSCOPY    . TONSILLECTOMY      Family History  Problem Relation Age of Onset  . Arthritis Mother   . Hyperlipidemia Mother   . Hypertension Mother   . Stroke Mother   . Mental retardation Mother   . Diabetes Mother   . Diabetes Maternal Grandfather   . Hypertension Maternal Grandfather   . Diabetes Paternal Grandmother   . Hypertension Paternal Grandmother   . Cancer Paternal Grandmother     breast    Allergies  Allergen Reactions  . Latex Anaphylaxis, Hives and Itching    "Anaphylaxis is only when I am in a closed area (ex: car with balloons)"  . Codeine Itching    Current Outpatient Prescriptions on File Prior to Visit  Medication Sig Dispense Refill  . aspirin EC 81 MG tablet Take 1 tablet (81 mg total) by mouth daily.    Marland Kitchen atomoxetine (STRATTERA) 100 MG capsule Take 100 mg by mouth daily.    . cetirizine (ZYRTEC) 10 MG tablet Take 10 mg by mouth daily.    . clonazePAM (KLONOPIN) 0.5 MG tablet Take 0.25 mg by mouth 2 (two) times daily as needed for anxiety.    . diclofenac sodium (VOLTAREN) 1 % GEL Apply to knees and back as needed.    . leflunomide (ARAVA) 20 MG tablet Take 20 mg by mouth daily.  0  . LYRICA 200 MG capsule Take 3  capsules by mouth daily.   0  . nabumetone (RELAFEN) 500 MG tablet Take 500 mg by mouth 3 (three) times daily.   0  . nitroGLYCERIN (NITROSTAT) 0.4 MG SL tablet Place 1 tablet (0.4 mg total) under the tongue every 5 (five) minutes as needed for chest pain. 30 tablet 12   No current facility-administered medications on file prior to visit.     BP 122/74 (BP Location: Left Arm, Patient Position: Sitting, Cuff Size: Normal)   Pulse 94   Temp 99.1 F (37.3 C) (Oral)   Wt 229 lb 6.4 oz (104.1 kg)   SpO2 98%   BMI 38.17 kg/m       Objective:   Physical Exam  Constitutional: She is oriented to person, place, and time. She appears well-developed and well-nourished.  HENT:  Right Ear: Tympanic membrane  normal.  Left Ear: Tympanic membrane normal.  Nose: No rhinorrhea. Right sinus exhibits no maxillary sinus tenderness and no frontal sinus tenderness. Left sinus exhibits no maxillary sinus tenderness and no frontal sinus tenderness.  Mouth/Throat: Mucous membranes are normal. No oropharyngeal exudate or posterior oropharyngeal erythema.  Eyes: Pupils are equal, round, and reactive to light. No scleral icterus.  Neck: Neck supple.  Cardiovascular: Normal rate and regular rhythm.   Pulmonary/Chest: Effort normal and breath sounds normal.  Abdominal: Soft. Bowel sounds are normal. There is no tenderness.  Lymphadenopathy:    She has no cervical adenopathy.  Neurological: She is alert and oriented to person, place, and time. Coordination normal.  Skin: Skin is warm and dry. No rash noted.  Psychiatric: She has a normal mood and affect. Her behavior is normal. Judgment and thought content normal.       Assessment & Plan:  1. Flu-like symptoms POC Influenza is negative; symptoms have been present less than 24 hours; with acutely ill appearance and symptoms consistent with influenza; suspect a possible false negative on test; will treat with tamiflu and advised supportive measures and follow up if symptoms do not improve, worsen, or she develops new symptoms or fever >101. - oseltamivir (TAMIFLU) 75 MG capsule; Take 1 capsule (75 mg total) by mouth 2 (two) times daily.  Dispense: 10 capsule; Refill: 0  2. Myalgia Tylenol as needed for discomfort - POCT Influenza A  Advised follow up with rhematologist as scheduled and she stated that she will call clinic today and let them know that she will hold off on Enbrel due to symptoms.  Delano Metz, FNP-C

## 2016-12-03 NOTE — Patient Instructions (Addendum)
Please take medication as directed. Your symptoms are most likely related to a viral illness which may be flu. Please drink plenty of water so that your urine is pale yellow or clear. Also, get plenty of rest, use tylenol as needed for discomfort and follow up if symptoms do not improve in 3 to 4 days, worsen, or you develop a fever >101.   Influenza, Adult Influenza, more commonly known as "the flu," is a viral infection that primarily affects the respiratory tract. The respiratory tract includes organs that help you breathe, such as the lungs, nose, and throat. The flu causes many common cold symptoms, as well as a high fever and body aches. The flu spreads easily from person to person (is contagious). Getting a flu shot (influenza vaccination) every year is the best way to prevent influenza. What are the causes? Influenza is caused by a virus. You can catch the virus by:  Breathing in droplets from an infected person's cough or sneeze.  Touching something that was recently contaminated with the virus and then touching your mouth, nose, or eyes. What increases the risk? The following factors may make you more likely to get the flu:  Not cleaning your hands frequently with soap and water or alcohol-based hand sanitizer.  Having close contact with many people during cold and flu season.  Touching your mouth, eyes, or nose without washing or sanitizing your hands first.  Not drinking enough fluids or not eating a healthy diet.  Not getting enough sleep or exercise.  Being under a high amount of stress.  Not getting a yearly (annual) flu shot. You may be at a higher risk of complications from the flu, such as a severe lung infection (pneumonia), if you:  Are over the age of 35.  Are pregnant.  Have a weakened disease-fighting system (immune system). You may have a weakened immune system if you:  Have HIV or AIDS.  Are undergoing chemotherapy.  Aretaking medicines that reduce  the activity of (suppress) the immune system.  Have a long-term (chronic) illness, such as heart disease, kidney disease, diabetes, or lung disease.  Have a liver disorder.  Are obese.  Have anemia. What are the signs or symptoms? Symptoms of this condition typically last 4-10 days and may include:  Fever.  Chills.  Headache, body aches, or muscle aches.  Sore throat.  Cough.  Runny or congested nose.  Chest discomfort and cough.  Poor appetite.  Weakness or tiredness (fatigue).  Dizziness.  Nausea or vomiting. How is this diagnosed? This condition may be diagnosed based on your medical history and a physical exam. Your health care provider may do a nose or throat swab test to confirm the diagnosis. How is this treated? If influenza is detected early, you can be treated with antiviral medicine that can reduce the length of your illness and the severity of your symptoms. This medicine may be given by mouth (orally) or through an IV tube that is inserted in one of your veins. The goal of treatment is to relieve symptoms by taking care of yourself at home. This may include taking over-the-counter medicines, drinking plenty of fluids, and adding humidity to the air in your home. In some cases, influenza goes away on its own. Severe influenza or complications from influenza may be treated in a hospital. Follow these instructions at home:  Take over-the-counter and prescription medicines only as told by your health care provider.  Use a cool mist humidifier to add humidity to  the air in your home. This can make breathing easier.  Rest as needed.  Drink enough fluid to keep your urine clear or pale yellow.  Cover your mouth and nose when you cough or sneeze.  Wash your hands with soap and water often, especially after you cough or sneeze. If soap and water are not available, use hand sanitizer.  Stay home from work or school as told by your health care provider. Unless  you are visiting your health care provider, try to avoid leaving home until your fever has been gone for 24 hours without the use of medicine.  Keep all follow-up visits as told by your health care provider. This is important. How is this prevented?  Getting an annual flu shot is the best way to avoid getting the flu. You may get the flu shot in late summer, fall, or winter. Ask your health care provider when you should get your flu shot.  Wash your hands often or use hand sanitizer often.  Avoid contact with people who are sick during cold and flu season.  Eat a healthy diet, drink plenty of fluids, get enough sleep, and exercise regularly. Contact a health care provider if:  You develop new symptoms.  You have:  Chest pain.  Diarrhea.  A fever.  Your cough gets worse.  You produce more mucus.  You feel nauseous or you vomit. Get help right away if:  You develop shortness of breath or difficulty breathing.  Your skin or nails turn a bluish color.  You have severe pain or stiffness in your neck.  You develop a sudden headache or sudden pain in your face or ear.  You cannot stop vomiting. This information is not intended to replace advice given to you by your health care provider. Make sure you discuss any questions you have with your health care provider. Document Released: 11/20/2000 Document Revised: 04/30/2016 Document Reviewed: 09/17/2015 Elsevier Interactive Patient Education  2017 [employer].

## 2016-12-22 ENCOUNTER — Ambulatory Visit (INDEPENDENT_AMBULATORY_CARE_PROVIDER_SITE_OTHER): Payer: Medicare Other | Admitting: Family Medicine

## 2016-12-22 ENCOUNTER — Encounter: Payer: Self-pay | Admitting: Family Medicine

## 2016-12-22 VITALS — BP 128/80 | HR 84 | Resp 12 | Ht 65.0 in | Wt 228.6 lb

## 2016-12-22 DIAGNOSIS — Z6837 Body mass index (BMI) 37.0-37.9, adult: Secondary | ICD-10-CM | POA: Diagnosis not present

## 2016-12-22 DIAGNOSIS — E669 Obesity, unspecified: Secondary | ICD-10-CM | POA: Diagnosis not present

## 2016-12-22 DIAGNOSIS — M797 Fibromyalgia: Secondary | ICD-10-CM | POA: Diagnosis not present

## 2016-12-22 DIAGNOSIS — M2550 Pain in unspecified joint: Secondary | ICD-10-CM | POA: Diagnosis not present

## 2016-12-22 DIAGNOSIS — M0609 Rheumatoid arthritis without rheumatoid factor, multiple sites: Secondary | ICD-10-CM | POA: Diagnosis not present

## 2016-12-22 DIAGNOSIS — J3089 Other allergic rhinitis: Secondary | ICD-10-CM | POA: Insufficient documentation

## 2016-12-22 DIAGNOSIS — R5382 Chronic fatigue, unspecified: Secondary | ICD-10-CM | POA: Diagnosis not present

## 2016-12-22 DIAGNOSIS — J309 Allergic rhinitis, unspecified: Secondary | ICD-10-CM

## 2016-12-22 DIAGNOSIS — J45901 Unspecified asthma with (acute) exacerbation: Secondary | ICD-10-CM

## 2016-12-22 DIAGNOSIS — M7989 Other specified soft tissue disorders: Secondary | ICD-10-CM | POA: Diagnosis not present

## 2016-12-22 MED ORDER — BUDESONIDE-FORMOTEROL FUMARATE 80-4.5 MCG/ACT IN AERO: 2.0000 | INHALATION_SPRAY | Freq: Two times a day (BID) | RESPIRATORY_TRACT | 3 refills | Status: DC

## 2016-12-22 MED ORDER — PREDNISONE 20 MG PO TABS: 40.0000 mg | ORAL_TABLET | Freq: Every day | ORAL | 0 refills | Status: AC

## 2016-12-22 MED ORDER — METHYLPREDNISOLONE ACETATE 80 MG/ML IJ SUSP
40.0000 mg | Freq: Once | INTRAMUSCULAR | Status: AC
  Administered 2016-12-22: 40 mg via INTRAMUSCULAR

## 2016-12-22 MED ORDER — IPRATROPIUM-ALBUTEROL 0.5-2.5 (3) MG/3ML IN SOLN
3.0000 mL | Freq: Once | RESPIRATORY_TRACT | Status: AC
  Administered 2016-12-22: 3 mL via RESPIRATORY_TRACT

## 2016-12-22 MED ORDER — MONTELUKAST SODIUM 10 MG PO TABS: 10.0000 mg | ORAL_TABLET | Freq: Every day | ORAL | 3 refills | Status: DC

## 2016-12-22 MED ORDER — ALBUTEROL SULFATE HFA 108 (90 BASE) MCG/ACT IN AERS: 2.0000 | INHALATION_SPRAY | Freq: Four times a day (QID) | RESPIRATORY_TRACT | 0 refills | Status: DC | PRN

## 2016-12-22 NOTE — Progress Notes (Signed)
Pre visit review using our clinic review tool, if applicable. No additional management support is needed unless otherwise documented below in the visit note. 

## 2016-12-22 NOTE — Progress Notes (Signed)
HPI:  ACUTE VISIT  Chief Complaint  Patient presents with  . Asthma    [name] is a 51 y.o.female here today complaining of a few days of respiratory symptoms.  She is reporting having influenza last week of 11/2016. Fever,chills, and myalgias have resolved. + Fatigue. Residual non productive cough, exacerbated with exertion, and getting worse. Alleviated some by rest. Mild nasal congestion, rhinorrhea, sore and post nasal drainage.  She has not noted chest pain or wheezing.  + Exertional dyspnea.  No Hx of recent travel. No sick contact.  Hx of allergies: Hx of asthma. She has not had her Symbicort and Singulair for a few months.Last exacerbation about 2 years ago. Last night she used her mother's nebulizer with Albuterol and helped some. She tells me that until a few months ago she was following with immunologists and getting allergy shots twice per week because of severe allergies.   OTC medications for this problem: None.    Review of Systems  Constitutional: Positive for fatigue. Negative for appetite change, chills and fever.  HENT: Positive for congestion and postnasal drip. Negative for mouth sores, sinus pain, sore throat, trouble swallowing and voice change.   Eyes: Negative for discharge and redness.  Respiratory: Positive for cough and shortness of breath. Negative for wheezing.   Gastrointestinal: Negative for abdominal pain, nausea and vomiting.  Musculoskeletal: Negative for myalgias and neck pain.  Skin: Negative for rash.  Allergic/Immunologic: Positive for environmental allergies.  Neurological: Positive for headaches (global and upon coughing.). Negative for syncope and weakness.  Hematological: Negative for adenopathy. Does not bruise/bleed easily.  Psychiatric/Behavioral: Negative for confusion.      Current Outpatient Prescriptions on File Prior to Visit  Medication Sig Dispense Refill  . aspirin EC 81 MG tablet Take 1 tablet  (81 mg total) by mouth daily.    Marland Kitchen atomoxetine (STRATTERA) 100 MG capsule Take 100 mg by mouth daily.    . cetirizine (ZYRTEC) 10 MG tablet Take 10 mg by mouth daily.    . clonazePAM (KLONOPIN) 0.5 MG tablet Take 0.25 mg by mouth 2 (two) times daily as needed for anxiety.    . diclofenac sodium (VOLTAREN) 1 % GEL Apply to knees and back as needed.    . leflunomide (ARAVA) 20 MG tablet Take 20 mg by mouth daily.  0  . LYRICA 200 MG capsule Take 3 capsules by mouth daily.   0  . nabumetone (RELAFEN) 500 MG tablet Take 500 mg by mouth 3 (three) times daily.   0  . nitroGLYCERIN (NITROSTAT) 0.4 MG SL tablet Place 1 tablet (0.4 mg total) under the tongue every 5 (five) minutes as needed for chest pain. 30 tablet 12   No current facility-administered medications on file prior to visit.      Past Medical History:  Diagnosis Date  . Allergy   . Anxiety   . Arthritis   . Asthma   . Depression   . Rheumatism    Allergies  Allergen Reactions  . Latex Anaphylaxis, Hives and Itching    "Anaphylaxis is only when I am in a closed area (ex: car with balloons)"  . Codeine Itching    Social History   Social History  . Marital status: Legally Separated    Spouse name: N/A  . Number of children: N/A  . Years of education: N/A   Social History Main Topics  . Smoking status: Never Smoker  . Smokeless tobacco: None  .  Alcohol use No  . Drug use: No  . Sexual activity: Not Asked   Other Topics Concern  . None   Social History Narrative  . None    Vitals:   12/22/16 1418  BP: 128/80  Pulse: 84  Resp: 12   O2 sat at RA 98%  Body mass index is 38.03 kg/m.   Physical Exam  Nursing note and vitals reviewed. Constitutional: She is oriented to person, place, and time. She appears well-developed. She does not appear ill. No distress.  HENT:  Head: Atraumatic.  Nose: Rhinorrhea present. Right sinus exhibits no maxillary sinus tenderness and no frontal sinus tenderness. Left sinus  exhibits no maxillary sinus tenderness and no frontal sinus tenderness.  Mouth/Throat: Oropharynx is clear and moist and mucous membranes are normal.  Eyes: Conjunctivae are normal.  Cardiovascular: Normal rate and regular rhythm.   No murmur heard. Respiratory: Effort normal and breath sounds normal. No stridor. No respiratory distress. She has no wheezes (prolonged expiration).  Frequent nonproductive cough during examination exacerbated by expiration.  Lymphadenopathy:    She has no cervical adenopathy.  Neurological: She is alert and oriented to person, place, and time. She has normal strength. Coordination and gait normal.  Skin: Skin is warm. No rash noted. No erythema.  Psychiatric: She has a normal mood and affect. Her speech is normal.  Well groomed, good eye contact.      ASSESSMENT AND PLAN:     Dawn Jordan was seen today for asthma.  Diagnoses and all orders for this visit:  Moderate asthma with acute exacerbation, unspecified whether persistent   Cough greatly improved after Duoneb neb treatment. DepoMedrol 40 mg IM x 1 and tomorrow she will continue with Prednisone daily x 5 days. Albuterol inh 2 puff q 4-6 hrs x 1 week then as needed. Resume Symbicort 80-4.5 mcg bid.  Instructed about warning signs and f/u in 1-2 weeks.   -     budesonide-formoterol (SYMBICORT) 80-4.5 MCG/ACT inhaler; Inhale 2 puffs into the lungs 2 (two) times daily. -     albuterol (PROVENTIL HFA;VENTOLIN HFA) 108 (90 Base) MCG/ACT inhaler; Inhale 2 puffs into the lungs every 6 (six) hours as needed for wheezing or shortness of breath. -     predniSONE (DELTASONE) 20 MG tablet; Take 2 tablets (40 mg total) by mouth daily with breakfast. -     montelukast (SINGULAIR) 10 MG tablet; Take 1 tablet (10 mg total) by mouth at bedtime. -     methylPREDNISolone acetate (DEPO-MEDROL) injection 40 mg; Inject 0.5 mLs (40 mg total) into the muscle once. -     ipratropium-albuterol (DUONEB) 0.5-2.5 (3) MG/3ML  nebulizer solution 3 mL; Take 3 mLs by nebulization once.  Allergic rhinitis, unspecified chronicity, unspecified seasonality, unspecified trigger  Resume Singulair 10 mg daily. OTC antihistaminics recommended. Appt with immunologist will be arrange to evaluate the need of continuation of immunotherapy.  -     Ambulatory referral to Immunology -     methylPREDNISolone acetate (DEPO-MEDROL) injection 40 mg; Inject 0.5 mLs (40 mg total) into the muscle once.     -Ms. Dawn Jordan was advised to return or notify a doctor immediately if symptoms worsen or new concerns arise.       Logan Baltimore G. Martinique, MD  [facility]. [place] office.

## 2016-12-22 NOTE — Patient Instructions (Signed)
A few things to remember from today's visit:   Moderate asthma with acute exacerbation, unspecified whether persistent  Allergic rhinitis, unspecified chronicity, unspecified seasonality, unspecified trigger - Plan: Ambulatory referral to Immunology Albuterol inhaler 2 puffs every 4 hours for a week Start prednisone tomorrow with breakfast Symbicort  Please be sure medication list is accurate. If a new problem present, please set up appointment sooner than planned today.

## 2017-01-01 ENCOUNTER — Ambulatory Visit: Payer: Medicare Other | Admitting: Family Medicine

## 2017-01-05 DIAGNOSIS — D485 Neoplasm of uncertain behavior of skin: Secondary | ICD-10-CM | POA: Diagnosis not present

## 2017-01-28 ENCOUNTER — Ambulatory Visit (INDEPENDENT_AMBULATORY_CARE_PROVIDER_SITE_OTHER): Payer: Medicare Other | Admitting: Allergy and Immunology

## 2017-01-28 ENCOUNTER — Encounter: Payer: Self-pay | Admitting: Allergy and Immunology

## 2017-01-28 VITALS — BP 136/86 | HR 85 | Temp 97.6°F | Resp 17 | Ht 64.0 in | Wt 230.6 lb

## 2017-01-28 DIAGNOSIS — J4541 Moderate persistent asthma with (acute) exacerbation: Secondary | ICD-10-CM

## 2017-01-28 DIAGNOSIS — J4540 Moderate persistent asthma, uncomplicated: Secondary | ICD-10-CM | POA: Insufficient documentation

## 2017-01-28 DIAGNOSIS — L259 Unspecified contact dermatitis, unspecified cause: Secondary | ICD-10-CM | POA: Insufficient documentation

## 2017-01-28 DIAGNOSIS — J3089 Other allergic rhinitis: Secondary | ICD-10-CM | POA: Diagnosis not present

## 2017-01-28 DIAGNOSIS — L230 Allergic contact dermatitis due to metals: Secondary | ICD-10-CM | POA: Diagnosis not present

## 2017-01-28 DIAGNOSIS — Z91018 Allergy to other foods: Secondary | ICD-10-CM | POA: Insufficient documentation

## 2017-01-28 MED ORDER — LEVOCETIRIZINE DIHYDROCHLORIDE 5 MG PO TABS: 5.0000 mg | ORAL_TABLET | Freq: Every evening | ORAL | 5 refills | Status: DC

## 2017-01-28 MED ORDER — BUDESONIDE-FORMOTEROL FUMARATE 160-4.5 MCG/ACT IN AERO: 2.0000 | INHALATION_SPRAY | Freq: Two times a day (BID) | RESPIRATORY_TRACT | 5 refills | Status: DC

## 2017-01-28 MED ORDER — FLUNISOLIDE 25 MCG/ACT (0.025%) NA SOLN: 1.0000 | Freq: Two times a day (BID) | NASAL | 5 refills | Status: DC

## 2017-01-28 MED ORDER — LEVALBUTEROL HCL 1.25 MG/3ML IN NEBU: 1.2500 mg | INHALATION_SOLUTION | RESPIRATORY_TRACT | 1 refills | Status: DC | PRN

## 2017-01-28 NOTE — Progress Notes (Signed)
New Patient Note  RE: Makari Siek MRN: [id number] DOB: Oct 21, 1966 Date of Office Visit: 01/28/2017  Referring provider: Martinique, Betty G, MD Primary care provider: Betty Martinique, MD  Chief Complaint: Allergic Rhinitis ; Allergies (food); Asthma; and Rash   History of present illness: Dawn Jordan is a 51 y.o. female seen today in consultation requested by Betty Martinique, MD. She reports that she was diagnosed with asthma approximately 8 years ago.  Her asthma symptoms consist of coughing, dyspnea, chest tightness, and wheezing and these lower respiratory symptoms are triggered by extremes of temperature, exercise, upper respiratory tract infections, and strong aromas, such as perfumes and colognes.  Despite compliance with Symbicort 80-4 0.5 g, 2 inhalations twice a day, and montelukast 10 mg daily at bedtime, she is experiencing asthma symptoms on a daily basis and experiencing nocturnal awakenings due to lower respiratory symptoms 3 or more nights out of the week.  She experiences frequent nasal congestion, rhinorrhea, postnasal drainage, throat irritation, ocular pruritus, and occasional sinus pressure.  In addition, she experiences frequent fatigue, however she is uncertain if this is related to her report arthritis and/or fibromyalgia.  Her nasal/sinus/ocular symptoms occur year around but tender be more frequent and severe in the springtime and in the fall.  Aside from pollen exposure, she believes that strong aromas trigger her nasal symptoms.  She has tried budesonide nasal spray and fluticasone nasal spray without adequate symptom relief. Crystalann reports that she experiences GI symptoms, typically diarrhea, immediately following certain meals.  She seems to a found a correlation between her GI symptoms in the consumption of chicken or coffee, however she has problems with meals which do not include chicken or coffee.  She does not experience concomitant cardiopulmonary or cutaneous  symptoms. Andreas will be having bilateral knee arthroplasty in the near future.  She believes that she may be allergic to metal and is interested in further testing prior to this procedure. She develops an erythematous, pruritic patch where her belt buckle touches her abdomen, or where the snap from her pants touches her abdomen.   Assessment and plan: Allergic rhinitis  Aeroallergen avoidance measures have been discussed and provided in written form.  A prescription has been provided for levocetirizine, 5mg  daily as needed.  A prescription for flunisolide nasal spray has been provided, one spray per nostril twice daily as needed.  I have also recommended nasal saline spray (i.e., Simply Saline) or nasal saline lavage (i.e., NeilMed) as needed prior to medicated nasal sprays.  Continue montelukast 10 mg daily at bedtime.  If allergen avoidance measures and medications fail to adequately relieve symptoms, aeroallergen immunotherapy will be considered.  Moderate persistent asthma Currently with suboptimal control.  A prescription has been provided for Symbicort (budesonide/formoterol) 160/4.5 g,  2 inhalations twice a day. To maximize pulmonary deposition, a spacer has been provided along with instructions for its proper administration with an HFA inhaler.  Discontinue Symbicort 80/4.5 g at this time.  To jumpstart symptom relief, prednisone has been provided, 20 mg x 4 days, 10 mg x1 day, then stop.  Continue montelukast 10 mg daily at bedtime and albuterol HFA, 1-2 inhalations every 4-6 hours as needed.  I have also recommended using albuterol 10-15 minutes prior to exercise.  The patient has been asked to contact me if her symptoms persist or progress. Otherwise, she may return for follow up in 2 months.  History of food allergy Gastrointestinal symptoms, uncertain etiology. Skin tests to select food allergens were negative today.  The negative predictive value of food allergen skin  testing is excellent (approximately 95%). While this does not appear to be an IgE mediated issue, skin testing does not rule out food intolerances or cell-mediated enteropathies which may lend to GI symptoms. These etiologies are suggested when elimination of the responsible food leads to symptom resolution and re-introduction of the food is followed by the return of symptoms.   The patient has been encouraged to keep a careful symptom/food journal and eliminate any food suspected of correlating with symptoms.   If GI symptoms persist or progress, gastroenterologist evaluation may be warranted.  Dermatitis, contact The patient's history suggests metal contact dermatitis.  She will return in the near future for patch testing to rule out contact dermatitis prior to her bilateral total knee arthroplasty.   Meds ordered this encounter  Medications  . levalbuterol (XOPENEX) 1.25 MG/3ML nebulizer solution    Sig: Take 1.25 mg by nebulization every 4 (four) hours as needed for wheezing.    Dispense:  75 mL    Refill:  1    J45.41  . levocetirizine (XYZAL) 5 MG tablet    Sig: Take 1 tablet (5 mg total) by mouth every evening.    Dispense:  30 tablet    Refill:  5  . budesonide-formoterol (SYMBICORT) 160-4.5 MCG/ACT inhaler    Sig: Inhale 2 puffs into the lungs 2 (two) times daily.    Dispense:  1 Inhaler    Refill:  5  . flunisolide (NASALIDE) 25 MCG/ACT (0.025%) SOLN    Sig: Place 1 spray into the nose 2 (two) times daily.    Dispense:  1 Bottle    Refill:  5    Diagnostics: Spirometry: FVC was 2.45 L and FEV1 was 2.24 L (86% predicted) without post bronchodilator improvement.  Please see scanned spirometry results for details. Epicutaneous testing:  Negative despite a positive histamine control. Intradermal testing: Positive to molds, cockroach antigen, and dust mite antigen. Food allergen skin testing:  Negative despite a positive histamine control.    Physical examination: Blood  pressure 136/86, pulse 85, temperature 97.6 F (36.4 C), temperature source Oral, resp. rate 17, height 5\' 4"  (1.626 m), weight 230 lb 9.6 oz (104.6 kg), SpO2 98 %.  General: Alert, interactive, in no acute distress. HEENT: TMs pearly gray, turbinates moderately edematous without discharge, post-pharynx moderately erythematous. Neck: Supple without lymphadenopathy. Lungs: Clear to auscultation without wheezing, rhonchi or rales. CV: Normal S1, S2 without murmurs. Abdomen: Nondistended, nontender. Skin: Warm and dry, without lesions or rashes. Extremities:  No clubbing, cyanosis or edema. Neuro:   Grossly intact.  Review of systems:  Review of systems negative except as noted in HPI / PMHx or noted below: Review of Systems  Constitutional: Negative.  Negative for chills, fever and weight loss.  HENT: Negative.  Negative for nosebleeds.   Eyes: Negative.  Negative for blurred vision.  Respiratory: Negative.  Negative for hemoptysis.   Cardiovascular: Negative.  Negative for chest pain.  Gastrointestinal: Positive for abdominal pain and diarrhea. Negative for constipation.  Genitourinary: Negative.  Negative for dysuria.  Musculoskeletal: Negative.  Negative for joint pain and myalgias.  Skin: Negative.   Neurological: Negative.  Negative for dizziness.  Endo/Heme/Allergies: Negative.  Does not bruise/bleed easily.  Psychiatric/Behavioral: Negative.     Past medical history:  Past Medical History:  Diagnosis Date  . Allergy   . Anxiety   . Arthritis   . Asthma   . Depression   . Rheumatism  Past surgical history:  Past Surgical History:  Procedure Laterality Date  . CHOLECYSTECTOMY    . CYST EXCISION     to hand  . KNEE ARTHROSCOPY    . TONSILLECTOMY      Family history: Family History  Problem Relation Age of Onset  . Arthritis Mother   . Hyperlipidemia Mother   . Hypertension Mother   . Stroke Mother   . Mental retardation Mother   . Diabetes Mother   .  Allergic rhinitis Mother   . Diabetes Maternal Grandfather   . Hypertension Maternal Grandfather   . Diabetes Paternal Grandmother   . Hypertension Paternal Grandmother   . Cancer Paternal Grandmother     breast  . Allergic rhinitis Brother   . Asthma Neg Hx   . Eczema Neg Hx   . Immunodeficiency Neg Hx   . Urticaria Neg Hx   . Atopy Neg Hx   . Angioedema Neg Hx     Social history: Social History   Social History  . Marital status: Legally Separated    Spouse name: N/A  . Number of children: N/A  . Years of education: N/A   Occupational History  . Not on file.   Social History Main Topics  . Smoking status: Never Smoker  . Smokeless tobacco: Never Used  . Alcohol use No  . Drug use: No  . Sexual activity: Not on file   Other Topics Concern  . Not on file   Social History Narrative  . No narrative on file   Environmental History: The patient lives in a 52 year old condominium with carpeting in the bedroom and central air/heat.  There is a cat and condominium with access to her bedroom.  She is a nonsmoker.  Allergies as of 01/28/2017      Reactions   Latex Anaphylaxis, Hives, Itching   "Anaphylaxis is only when I am in a closed area (ex: car with balloons)"   Codeine Itching      Medication List       Accurate as of 01/28/17  4:24 PM. Always use your most recent med list.          albuterol 108 (90 Base) MCG/ACT inhaler Commonly known as:  PROVENTIL HFA;VENTOLIN HFA Inhale 2 puffs into the lungs every 6 (six) hours as needed for wheezing or shortness of breath.   aspirin EC 81 MG tablet Take 1 tablet (81 mg total) by mouth daily.   atomoxetine 100 MG capsule Commonly known as:  STRATTERA Take 100 mg by mouth daily.   budesonide-formoterol 80-4.5 MCG/ACT inhaler Commonly known as:  SYMBICORT Inhale 2 puffs into the lungs 2 (two) times daily.   budesonide-formoterol 160-4.5 MCG/ACT inhaler Commonly known as:  SYMBICORT Inhale 2 puffs into the  lungs 2 (two) times daily.   clonazePAM 0.5 MG tablet Commonly known as:  KLONOPIN Take 0.25 mg by mouth 2 (two) times daily as needed for anxiety.   flunisolide 25 MCG/ACT (0.025%) Soln Commonly known as:  NASALIDE Place 1 spray into the nose 2 (two) times daily.   leflunomide 20 MG tablet Commonly known as:  ARAVA Take 20 mg by mouth daily.   levalbuterol 1.25 MG/3ML nebulizer solution Commonly known as:  XOPENEX Take 1.25 mg by nebulization every 4 (four) hours as needed for wheezing.   levocetirizine 5 MG tablet Commonly known as:  XYZAL Take 1 tablet (5 mg total) by mouth every evening.   LYRICA 200 MG capsule Generic drug:  pregabalin Take  3 capsules by mouth daily.   montelukast 10 MG tablet Commonly known as:  SINGULAIR Take 1 tablet (10 mg total) by mouth at bedtime.   nabumetone 500 MG tablet Commonly known as:  RELAFEN Take 500 mg by mouth 3 (three) times daily.   nitroGLYCERIN 0.4 MG SL tablet Commonly known as:  NITROSTAT Place 1 tablet (0.4 mg total) under the tongue every 5 (five) minutes as needed for chest pain.   VOLTAREN 1 % Gel Generic drug:  diclofenac sodium Apply to knees and back as needed.       Known medication allergies: Allergies  Allergen Reactions  . Latex Anaphylaxis, Hives and Itching    "Anaphylaxis is only when I am in a closed area (ex: car with balloons)"  . Codeine Itching    I appreciate the opportunity to take part in Aleyna's care. Please do not hesitate to contact me with questions.  Sincerely,   R. Edgar Frisk, MD

## 2017-01-28 NOTE — Assessment & Plan Note (Signed)
Gastrointestinal symptoms, uncertain etiology. Skin tests to select food allergens were negative today. The negative predictive value of food allergen skin testing is excellent (approximately 95%). While this does not appear to be an IgE mediated issue, skin testing does not rule out food intolerances or cell-mediated enteropathies which may lend to GI symptoms. These etiologies are suggested when elimination of the responsible food leads to symptom resolution and re-introduction of the food is followed by the return of symptoms.   The patient has been encouraged to keep a careful symptom/food journal and eliminate any food suspected of correlating with symptoms.   If GI symptoms persist or progress, gastroenterologist evaluation may be warranted. 

## 2017-01-28 NOTE — Assessment & Plan Note (Signed)
Currently with suboptimal control.  A prescription has been provided for Symbicort (budesonide/formoterol) 160/4.5 g, 2 inhalations twice a day. To maximize pulmonary deposition, a spacer has been provided along with instructions for its proper administration with an HFA inhaler.  Discontinue Symbicort 80/4.5 g at this time.  To jumpstart symptom relief, prednisone has been provided, 20 mg x 4 days, 10 mg x1 day, then stop.  Continue montelukast 10 mg daily at bedtime and albuterol HFA, 1-2 inhalations every 4-6 hours as needed.  I have also recommended using albuterol 10-15 minutes prior to exercise.  The patient has been asked to contact me if her symptoms persist or progress. Otherwise, she may return for follow up in 2 months.

## 2017-01-28 NOTE — Patient Instructions (Signed)
Allergic rhinitis  Aeroallergen avoidance measures have been discussed and provided in written form.  A prescription has been provided for levocetirizine, 5mg  daily as needed.  A prescription for flunisolide nasal spray has been provided, one spray per nostril twice daily as needed.  I have also recommended nasal saline spray (i.e., Simply Saline) or nasal saline lavage (i.e., NeilMed) as needed prior to medicated nasal sprays.  Continue montelukast 10 mg daily at bedtime.  If allergen avoidance measures and medications fail to adequately relieve symptoms, aeroallergen immunotherapy will be considered.  Moderate persistent asthma Currently with suboptimal control.  A prescription has been provided for Symbicort (budesonide/formoterol) 160/4.5 g,  2 inhalations twice a day. To maximize pulmonary deposition, a spacer has been provided along with instructions for its proper administration with an HFA inhaler.  Discontinue Symbicort 80/4.5 g at this time.  To jumpstart symptom relief, prednisone has been provided, 20 mg x 4 days, 10 mg x1 day, then stop.  Continue montelukast 10 mg daily at bedtime and albuterol HFA, 1-2 inhalations every 4-6 hours as needed.  I have also recommended using albuterol 10-15 minutes prior to exercise.  The patient has been asked to contact me if her symptoms persist or progress. Otherwise, she may return for follow up in 2 months.  History of food allergy Gastrointestinal symptoms, uncertain etiology. Skin tests to select food allergens were negative today. The negative predictive value of food allergen skin testing is excellent (approximately 95%). While this does not appear to be an IgE mediated issue, skin testing does not rule out food intolerances or cell-mediated enteropathies which may lend to GI symptoms. These etiologies are suggested when elimination of the responsible food leads to symptom resolution and re-introduction of the food is followed by the  return of symptoms.   The patient has been encouraged to keep a careful symptom/food journal and eliminate any food suspected of correlating with symptoms.   If GI symptoms persist or progress, gastroenterologist evaluation may be warranted.  Dermatitis, contact The patient's history suggests metal contact dermatitis.  She will return in the near future for patch testing to rule out contact dermatitis prior to her bilateral total knee arthroplasty.   Return for metal patch testing on a Tuesday.  Control of Mold Allergen  Mold and fungi can grow on a variety of surfaces provided certain temperature and moisture conditions exist.  Outdoor molds grow on plants, decaying vegetation and soil.  The major outdoor mold, Alternaria and Cladosporium, are found in very high numbers during hot and dry conditions.  Generally, a late Summer - Fall peak is seen for common outdoor fungal spores.  Rain will temporarily lower outdoor mold spore count, but counts rise rapidly when the rainy period ends.  The most important indoor molds are Aspergillus and Penicillium.  Dark, humid and poorly ventilated basements are ideal sites for mold growth.  The next most common sites of mold growth are the bathroom and the kitchen.  Outdoor [employer] 1. Use air conditioning and keep windows closed 2. Avoid exposure to decaying vegetation. 3. Avoid leaf raking. 4. Avoid grain handling. 5. Consider wearing a face mask if working in moldy areas.  Indoor Mold Control 1. Maintain humidity below 50%. 2. Clean washable surfaces with 5% bleach solution. 3. Remove sources e.g. Contaminated carpets.  Control of Cockroach Allergen  Cockroach allergen has been identified as an important cause of acute attacks of asthma, especially in urban settings.  There are fifty-five species of cockroach that  exist in the [place], however only three, the [place], [name] and [place] species produce allergen that can affect  patients with Asthma.  Allergens can be obtained from fecal particles, egg casings and secretions from cockroaches.    1. Remove food sources. 2. Reduce access to water. 3. Seal access and entry points. 4. Spray runways with 0.5-1% Diazinon or Chlorpyrifos 5. Blow boric acid power under stoves and refrigerator. 6. Place bait stations (hydramethylnon) at feeding sites.  Control of House Dust Mite Allergen  House dust mites play a major role in allergic asthma and rhinitis.  They occur in environments with high humidity wherever human skin, the food for dust mites is found. High levels have been detected in dust obtained from mattresses, pillows, carpets, upholstered furniture, bed covers, clothes and soft toys.  The principal allergen of the house dust mite is found in its feces.  A gram of dust may contain 1,000 mites and 250,000 fecal particles.  Mite antigen is easily measured in the air during house cleaning activities.    1. Encase mattresses, including the box spring, and pillow, in an air tight cover.  Seal the zipper end of the encased mattresses with wide adhesive tape. 2. Wash the bedding in water of 130 degrees Farenheit weekly.  Avoid cotton comforters/quilts and flannel bedding: the most ideal bed covering is the dacron comforter. 3. Remove all upholstered furniture from the bedroom. 4. Remove carpets, carpet padding, rugs, and non-washable window drapes from the bedroom.  Wash drapes weekly or use plastic window coverings. 5. Remove all non-washable stuffed toys from the bedroom.  Wash stuffed toys weekly. 6. Have the room cleaned frequently with a vacuum cleaner and a damp dust-mop.  The patient should not be in a room which is being cleaned and should wait 1 hour after cleaning before going into the room. 7. Close and seal all heating outlets in the bedroom.  Otherwise, the room will become filled with dust-laden air.  An electric heater can be used to heat the room. 8. Reduce  indoor humidity to less than 50%.  Do not use a humidifier.

## 2017-01-28 NOTE — Assessment & Plan Note (Signed)
The patient's history suggests metal contact dermatitis.  She will return in the near future for patch testing to rule out contact dermatitis prior to her bilateral total knee arthroplasty.

## 2017-01-28 NOTE — Assessment & Plan Note (Signed)
   Aeroallergen avoidance measures have been discussed and provided in written form.  A prescription has been provided for levocetirizine, 5mg  daily as needed.  A prescription for flunisolide nasal spray has been provided, one spray per nostril twice daily as needed.  I have also recommended nasal saline spray (i.e., Simply Saline) or nasal saline lavage (i.e., NeilMed) as needed prior to medicated nasal sprays.  Continue montelukast 10 mg daily at bedtime.  If allergen avoidance measures and medications fail to adequately relieve symptoms, aeroallergen immunotherapy will be considered.
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
## 2017-02-10 ENCOUNTER — Ambulatory Visit (INDEPENDENT_AMBULATORY_CARE_PROVIDER_SITE_OTHER): Payer: Medicare Other | Admitting: Psychiatry

## 2017-02-10 ENCOUNTER — Encounter (HOSPITAL_COMMUNITY): Payer: Self-pay | Admitting: Psychiatry

## 2017-02-10 VITALS — BP 130/92 | HR 81 | Ht 64.0 in | Wt 228.2 lb

## 2017-02-10 DIAGNOSIS — Z888 Allergy status to other drugs, medicaments and biological substances status: Secondary | ICD-10-CM | POA: Diagnosis not present

## 2017-02-10 DIAGNOSIS — F909 Attention-deficit hyperactivity disorder, unspecified type: Secondary | ICD-10-CM | POA: Diagnosis not present

## 2017-02-10 DIAGNOSIS — Z79899 Other long term (current) drug therapy: Secondary | ICD-10-CM

## 2017-02-10 DIAGNOSIS — Z91040 Latex allergy status: Secondary | ICD-10-CM | POA: Diagnosis not present

## 2017-02-10 MED ORDER — ATOMOXETINE HCL 100 MG PO CAPS: 100.0000 mg | ORAL_CAPSULE | Freq: Every day | ORAL | 0 refills | Status: DC

## 2017-02-10 NOTE — Patient Instructions (Addendum)
1. Continue Strattera 100 mg daily 2. [facility] office for therapy  [address] Renae Fickle [place], [place] [address]   [phone] 3. Return to clinic in one month 4. Referral for ADHD evaluation

## 2017-02-23 ENCOUNTER — Ambulatory Visit (INDEPENDENT_AMBULATORY_CARE_PROVIDER_SITE_OTHER): Payer: Medicare Other | Admitting: Allergy and Immunology

## 2017-02-23 ENCOUNTER — Encounter: Payer: Self-pay | Admitting: Allergy and Immunology

## 2017-02-23 VITALS — BP 120/76 | HR 80 | Resp 16

## 2017-02-23 DIAGNOSIS — L230 Allergic contact dermatitis due to metals: Secondary | ICD-10-CM

## 2017-02-23 DIAGNOSIS — J3089 Other allergic rhinitis: Secondary | ICD-10-CM

## 2017-02-23 DIAGNOSIS — J4540 Moderate persistent asthma, uncomplicated: Secondary | ICD-10-CM

## 2017-02-23 NOTE — Progress Notes (Signed)
Follow-up Note  RE: Dawn Jordan MRN: [id number] DOB: [birth year] Date of Office Visit: 02/23/2017  Primary care provider: Betty Martinique, MD Referring provider: Martinique, Betty G, MD  History of present illness: Dawn Jordan is a 51 y.o. female with persistent asthma, allergic rhinitis, and suspected contact dermatitis presents today for metal patch testing.  Please see history of present illness from 01/28/2017 for details.  She has no allergy or asthma related complaints today.   Assessment and plan: Dermatitis, contact The patient's history suggests metal contact dermatitis.   Metal patch test panel has been placed.  Instructions have been provided regarding keeping the patches clean and dry as well as returning at appropriate intervals for patch test interpretation.  Moderate persistent asthma  Continue Symbicort 160-4.5 g, 2 inhalations via spacer device.  He, montelukast 10 mg daily bedtime, and albuterol HFA, 1-2 inhalations every 4-6 hours as needed.  Allergic rhinitis  Continue appropriate allergen avoidance measures, levocetirizine as needed, flunisolide nasal spray as needed, and montelukast daily.  If allergen avoidance measures and medications fail to adequately relieve symptoms, aeroallergen immunotherapy will be considered.   Diagnostics: Spirometry:  Normal with an FEV1 of 93% predicted.  Please see scanned spirometry results for details.    Physical examination: Blood pressure 120/76, pulse 80, resp. rate 16.  General: Alert, interactive, in no acute distress. Neck: Supple without lymphadenopathy. Lungs: Clear to auscultation without wheezing, rhonchi or rales. CV: Normal S1, S2 without murmurs. Skin: Warm and dry, without lesions or rashes.  The following portions of the patient's history were reviewed and updated as appropriate: allergies, current medications, past family history, past medical history, past social history, past surgical history and  problem list.  Allergies as of 02/23/2017      Reactions   Latex Anaphylaxis, Hives, Itching   "Anaphylaxis is only when I am in a closed area (ex: car with balloons)"   Codeine Itching      Medication List       Accurate as of 02/23/17  1:25 PM. Always use your most recent med list.          albuterol 108 (90 Base) MCG/ACT inhaler Commonly known as:  PROVENTIL HFA;VENTOLIN HFA Inhale 2 puffs into the lungs every 6 (six) hours as needed for wheezing or shortness of breath.   aspirin EC 81 MG tablet Take 1 tablet (81 mg total) by mouth daily.   atomoxetine 100 MG capsule Commonly known as:  STRATTERA Take 1 capsule (100 mg total) by mouth daily.   budesonide-formoterol 80-4.5 MCG/ACT inhaler Commonly known as:  SYMBICORT Inhale 2 puffs into the lungs 2 (two) times daily.   budesonide-formoterol 160-4.5 MCG/ACT inhaler Commonly known as:  SYMBICORT Inhale 2 puffs into the lungs 2 (two) times daily.   clonazePAM 0.5 MG tablet Commonly known as:  KLONOPIN Take 0.25 mg by mouth 2 (two) times daily as needed for anxiety.   flunisolide 25 MCG/ACT (0.025%) Soln Commonly known as:  NASALIDE Place 1 spray into the nose 2 (two) times daily.   leflunomide 20 MG tablet Commonly known as:  ARAVA Take 20 mg by mouth daily.   levalbuterol 1.25 MG/3ML nebulizer solution Commonly known as:  XOPENEX Take 1.25 mg by nebulization every 4 (four) hours as needed for wheezing.   levocetirizine 5 MG tablet Commonly known as:  XYZAL Take 1 tablet (5 mg total) by mouth every evening.   LYRICA 200 MG capsule Generic drug:  pregabalin Take 3 capsules by mouth daily.  montelukast 10 MG tablet Commonly known as:  SINGULAIR Take 1 tablet (10 mg total) by mouth at bedtime.   nabumetone 500 MG tablet Commonly known as:  RELAFEN Take 500 mg by mouth 3 (three) times daily.   nitroGLYCERIN 0.4 MG SL tablet Commonly known as:  NITROSTAT Place 1 tablet (0.4 mg total) under the tongue  every 5 (five) minutes as needed for chest pain.   VOLTAREN 1 % Gel Generic drug:  diclofenac sodium Apply to knees and back as needed.       Allergies  Allergen Reactions  . Latex Anaphylaxis, Hives and Itching    "Anaphylaxis is only when I am in a closed area (ex: car with balloons)"  . Codeine Itching    I appreciate the opportunity to take part in [name]'s care. Please do not hesitate to contact me with questions.  Sincerely,   R. Edgar Frisk, MD

## 2017-02-23 NOTE — Patient Instructions (Addendum)
  Dermatitis, contact The patient's history suggests metal contact dermatitis.   Metal patch test panel has been placed.  Instructions have been provided regarding keeping the patches clean and dry as well as returning at appropriate intervals for patch test interpretation.  Moderate persistent asthma  Continue Symbicort 160-4.5 g, 2 inhalations via spacer device.  He, montelukast 10 mg daily bedtime, and albuterol HFA, 1-2 inhalations every 4-6 hours as needed.  Allergic rhinitis  Continue appropriate allergen avoidance measures, levocetirizine as needed, flunisolide nasal spray as needed, and montelukast daily.  If allergen avoidance measures and medications fail to adequately relieve symptoms, aeroallergen immunotherapy will be considered.   Return in about 2 days (around 02/25/2017) for patch test interpretation.

## 2017-02-23 NOTE — Assessment & Plan Note (Signed)
The patient's history suggests metal contact dermatitis.   Metal patch test panel has been placed.  Instructions have been provided regarding keeping the patches clean and dry as well as returning at appropriate intervals for patch test interpretation.

## 2017-02-23 NOTE — Assessment & Plan Note (Signed)
   Continue appropriate allergen avoidance measures, levocetirizine as needed, flunisolide nasal spray as needed, and montelukast daily.  If allergen avoidance measures and medications fail to adequately relieve symptoms, aeroallergen immunotherapy will be considered.

## 2017-02-23 NOTE — Assessment & Plan Note (Signed)
   Continue Symbicort 160-4.5 g, 2 inhalations via spacer device.  He, montelukast 10 mg daily bedtime, and albuterol HFA, 1-2 inhalations every 4-6 hours as needed.

## 2017-02-24 ENCOUNTER — Ambulatory Visit (HOSPITAL_COMMUNITY): Payer: Medicare Other | Admitting: Psychiatry

## 2017-02-26 ENCOUNTER — Ambulatory Visit: Payer: Medicare Other | Admitting: Allergy

## 2017-02-26 DIAGNOSIS — L230 Allergic contact dermatitis due to metals: Secondary | ICD-10-CM

## 2017-02-26 NOTE — Progress Notes (Signed)
    Follow-up Note  RE: Dawn Jordan MRN: [id number] DOB: [birth year] Date of Office Visit: 02/26/2017   History of present illness: Dawn Jordan is a 51 y.o. female presenting today for metal patch test reading at 72 hours.     Review of Systems  Unable to perform ROS: Other   review of systems not necessary for visit today   Diagnositics/Labs: Metal patch removed today. She had very positive result to copper sulfate pentahydrate 2% with erythema and pustules.  Equivocal response to nickel sulfate with mild punctate erythematous macules.     Assessment and plan:   Allergic contact dermatitis to metals Positive result for copper and equivocal to nickel. She was provided with informational handout on these 2 metals. Advised that she return on Monday for second patch reading.    She should avoid these metals that she is allergic to.   return on Monday for last reading   I appreciate the opportunity to take part in [place] care. Please do not hesitate to contact me with questions.  Sincerely,   Prudy Feeler, MD Allergy/Immunology [facility]

## 2017-02-26 NOTE — Patient Instructions (Addendum)
Positive result for copper and equivocal to nickel. She was provided with informational handout on these 2 metals. Advised that she return on Monday for second patch reading.    She should avoid these metals that she is allergic to.

## 2017-03-01 ENCOUNTER — Ambulatory Visit: Payer: Medicare Other | Admitting: Allergy and Immunology

## 2017-03-01 DIAGNOSIS — L230 Allergic contact dermatitis due to metals: Secondary | ICD-10-CM

## 2017-03-01 NOTE — Progress Notes (Signed)
    Follow-up Note  RE: Dawn Jordan MRN: [id number] DOB: [birth year] Date of Office Visit: 03/01/2017  Primary care provider: Betty Martinique, MD Referring provider: Martinique, Betty G, MD   Sonna returns to the office today for the final metal patch test interpretation, given suspected history of contact dermatitis.    Diagnostics:  Metal patch final reading: Positive to nickel sulfate, chromium chloride, and copper sulfate.  Assessment and Plan:   Allergic contact dermatitis  Meticulous avoidance of nickel sulfate, chromium chloride, and copper sulfate is recommended. If avoidance is not possible, the use of barrier creams or lotions is recommended.

## 2017-03-01 NOTE — Patient Instructions (Addendum)
Dermatitis, contact  Meticulous avoidance of nickel sulfate, chromium chloride, and copper sulfate is recommended. If avoidance is not possible, the use of barrier creams or lotions is recommended.   Return if symptoms worsen or fail to improve.

## 2017-03-01 NOTE — Assessment & Plan Note (Signed)
   Meticulous avoidance of nickel sulfate, chromium chloride, and copper sulfate is recommended. If avoidance is not possible, the use of barrier creams or lotions is recommended.

## 2017-03-01 NOTE — Progress Notes (Deleted)
Follow-up Note  RE: Dawn Jordan MRN: [id number] DOB: Mar 13, 1966 Date of Office Visit: 03/01/2017  Primary care provider: Betty Martinique, MD Referring provider: Martinique, Dawn G, MD  History of present illness: [email or web address]   Follow-up Note  RE: Dawn Jordan MRN: [id number] DOB: [birth year] Date of Office Visit: 03/01/2017  Primary care provider: Betty Martinique, MD Referring provider: Martinique, Dawn G, MD   Dawn Jordan returns to the office today for the final metal patch test interpretation, given suspected history of contact dermatitis.    Diagnostics:  TRUE TEST *** hour reading:   Assessment and Plan:   Allergic contact dermatitis The patient has been provided detailed information regarding the substances she is sensitive to, as well as products containing the substances.  Meticulous avoidance of these substances is recommended. If avoidance is not possible, the use of barrier creams or lotions is recommended. If symptoms persist or progress despite meticulous avoidance of ***, dermatology evaluation may be warranted.   Assessment and plan: No problem-specific Assessment & Plan notes found for this encounter.   No orders of the defined types were placed in this encounter.   Diagnostics: ***    Physical examination: There were no vitals taken for this visit.  General: Alert, interactive, in no acute distress. HEENT: TMs pearly gray, turbinates {Blank single:19197::"non-edematous","edematous","edematous and pale","markedly edematous","markedly edematous and pale","moderately edematous","mildly edematous","minimally edematous"} {Blank single:19197::"with crusty discharge","with thick discharge","with clear discharge","without discharge"}, post-pharynx {Blank single:19197::"non erythematous","erythematous","markedly erythematous","moderately erythematous","unremarkable","mildly erythematous"}. Neck: Supple without lymphadenopathy. Lungs: {Blank  single:19197::"Decreased breath sounds with expiratory wheezing bilaterally","Mildly decreased breath sounds with expiratory wheezing bilaterally","Decreased breath sounds bilaterally without wheezing, rhonchi or rales","Mildly decreased breath sounds bilaterally without wheezing, rhonchi or rales","Clear to auscultation without wheezing, rhonchi or rales"}. CV: Normal S1, S2 without murmurs. Skin: {Blank single:19197::"Dry, erythematous, excoriated patches on the ***","Dry, hyperpigmented, thickened patches on the ***","Dry, mildly hyperpigmented, mildly thickened patches on the ***","Scattered erythematous urticarial type lesions primarily located *** , nonvesicular","Warm and dry, without lesions or rashes"}.  {Common ambulatory SmartLinks:19316::" "}  Allergies as of 03/01/2017      Reactions   Latex Anaphylaxis, Hives, Itching   "Anaphylaxis is only when I am in a closed area (ex: car with balloons)"   Codeine Itching      Medication List       Accurate as of 03/01/17 10:14 AM. Always use your most recent med list.          albuterol 108 (90 Base) MCG/ACT inhaler Commonly known as:  PROVENTIL HFA;VENTOLIN HFA Inhale 2 puffs into the lungs every 6 (six) hours as needed for wheezing or shortness of breath.   aspirin EC 81 MG tablet Take 1 tablet (81 mg total) by mouth daily.   atomoxetine 100 MG capsule Commonly known as:  STRATTERA Take 1 capsule (100 mg total) by mouth daily.   budesonide-formoterol 80-4.5 MCG/ACT inhaler Commonly known as:  SYMBICORT Inhale 2 puffs into the lungs 2 (two) times daily.   budesonide-formoterol 160-4.5 MCG/ACT inhaler Commonly known as:  SYMBICORT Inhale 2 puffs into the lungs 2 (two) times daily.   clonazePAM 0.5 MG tablet Commonly known as:  KLONOPIN Take 0.25 mg by mouth 2 (two) times daily as needed for anxiety.   flunisolide 25 MCG/ACT (0.025%) Soln Commonly known as:  NASALIDE Place 1 spray into the nose 2 (two) times daily.     leflunomide 20 MG tablet Commonly known as:  ARAVA Take 20 mg by mouth daily.   levalbuterol 1.25 MG/3ML nebulizer solution Commonly known as:  XOPENEX Take 1.25 mg by nebulization every 4 (four) hours as needed for wheezing.   levocetirizine 5 MG tablet Commonly known as:  XYZAL Take 1 tablet (5 mg total) by mouth every evening.   LYRICA 200 MG capsule Generic drug:  pregabalin Take 3 capsules by mouth daily.   montelukast 10 MG tablet Commonly known as:  SINGULAIR Take 1 tablet (10 mg total) by mouth at bedtime.   nabumetone 500 MG tablet Commonly known as:  RELAFEN Take 500 mg by mouth 3 (three) times daily.   nitroGLYCERIN 0.4 MG SL tablet Commonly known as:  NITROSTAT Place 1 tablet (0.4 mg total) under the tongue every 5 (five) minutes as needed for chest pain.   VOLTAREN 1 % Gel Generic drug:  diclofenac sodium Apply to knees and back as needed.       Allergies  Allergen Reactions  . Latex Anaphylaxis, Hives and Itching    "Anaphylaxis is only when I am in a closed area (ex: car with balloons)"  . Codeine Itching    I appreciate the opportunity to take part in Dawn Jordan's care. Please do not hesitate to contact me with questions.  Sincerely,   R. Edgar Frisk, MD

## 2017-03-02 ENCOUNTER — Ambulatory Visit (HOSPITAL_COMMUNITY): Payer: Self-pay | Admitting: Psychiatry

## 2017-03-03 ENCOUNTER — Ambulatory Visit (HOSPITAL_COMMUNITY): Payer: Medicare Other | Admitting: Psychiatry

## 2017-03-04 ENCOUNTER — Other Ambulatory Visit: Payer: Self-pay

## 2017-03-04 DIAGNOSIS — J45901 Unspecified asthma with (acute) exacerbation: Secondary | ICD-10-CM

## 2017-03-04 MED ORDER — MONTELUKAST SODIUM 10 MG PO TABS
10.0000 mg | ORAL_TABLET | Freq: Every day | ORAL | 1 refills | Status: DC
Start: 2017-03-04 — End: 2017-09-10
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
## 2017-03-09 ENCOUNTER — Ambulatory Visit (INDEPENDENT_AMBULATORY_CARE_PROVIDER_SITE_OTHER): Payer: Medicare Other | Admitting: Psychiatry

## 2017-03-09 ENCOUNTER — Encounter (HOSPITAL_COMMUNITY): Payer: Self-pay | Admitting: Psychiatry

## 2017-03-09 VITALS — BP 121/72 | HR 68 | Ht 64.0 in | Wt 231.6 lb

## 2017-03-09 DIAGNOSIS — Z818 Family history of other mental and behavioral disorders: Secondary | ICD-10-CM

## 2017-03-09 DIAGNOSIS — Z811 Family history of alcohol abuse and dependence: Secondary | ICD-10-CM | POA: Diagnosis not present

## 2017-03-09 DIAGNOSIS — F321 Major depressive disorder, single episode, moderate: Secondary | ICD-10-CM | POA: Insufficient documentation

## 2017-03-09 DIAGNOSIS — F909 Attention-deficit hyperactivity disorder, unspecified type: Secondary | ICD-10-CM | POA: Diagnosis not present

## 2017-03-09 DIAGNOSIS — Z79899 Other long term (current) drug therapy: Secondary | ICD-10-CM

## 2017-03-09 DIAGNOSIS — F331 Major depressive disorder, recurrent, moderate: Secondary | ICD-10-CM

## 2017-03-09 HISTORY — DX: Major depressive disorder, single episode, moderate: F32.1

## 2017-03-09 MED ORDER — ATOMOXETINE HCL 100 MG PO CAPS: 100.0000 mg | ORAL_CAPSULE | Freq: Every day | ORAL | 0 refills | Status: DC

## 2017-03-09 MED ORDER — DULOXETINE HCL 30 MG PO CPEP: 30.0000 mg | ORAL_CAPSULE | Freq: Every day | ORAL | 0 refills | Status: DC

## 2017-03-09 NOTE — Patient Instructions (Addendum)
1. Continue Strattera 100 mg daily 2. Start duloxetine 30 mg daily 3. Return to clinic in one month

## 2017-03-15 ENCOUNTER — Telehealth (HOSPITAL_COMMUNITY): Payer: Self-pay | Admitting: *Deleted

## 2017-03-15 NOTE — Telephone Encounter (Signed)
Please confirm with the pharmacy regarding the order above (Patient would not need a refill)

## 2017-03-15 NOTE — Telephone Encounter (Signed)
Pt pharmacy faxed refill request for pt Atomoxetine HCL 100 mg. Pt medication was last refilled on 03-09-2017 with 30 tab 0 refill. Pt was last seen 03-09-2017 and f/u appt is scheduled for 04-07-2017. Pt pharmacy is [facility] in [place] on [address].

## 2017-03-17 NOTE — Telephone Encounter (Signed)
Per provider to call pt pharmacy to make sure they received pt medication that was requested in previous message. Called pharmacy and was informed it was received and picked up on 03-09-2017

## 2017-03-25 DIAGNOSIS — E669 Obesity, unspecified: Secondary | ICD-10-CM | POA: Diagnosis not present

## 2017-03-25 DIAGNOSIS — M0609 Rheumatoid arthritis without rheumatoid factor, multiple sites: Secondary | ICD-10-CM | POA: Diagnosis not present

## 2017-03-25 DIAGNOSIS — M2550 Pain in unspecified joint: Secondary | ICD-10-CM | POA: Diagnosis not present

## 2017-03-25 DIAGNOSIS — Z6838 Body mass index (BMI) 38.0-38.9, adult: Secondary | ICD-10-CM | POA: Diagnosis not present

## 2017-03-25 DIAGNOSIS — M797 Fibromyalgia: Secondary | ICD-10-CM | POA: Diagnosis not present

## 2017-03-25 DIAGNOSIS — R5382 Chronic fatigue, unspecified: Secondary | ICD-10-CM | POA: Diagnosis not present

## 2017-03-25 DIAGNOSIS — M7989 Other specified soft tissue disorders: Secondary | ICD-10-CM | POA: Diagnosis not present

## 2017-03-25 LAB — CBC AND DIFFERENTIAL
HCT: 24 % — AB (ref 36–46)
Hemoglobin: 7.7 g/dL — AB (ref 12.0–16.0)
Neutrophils Absolute: 7 /µL
Platelets: 386 10*3/uL (ref 150–399)
WBC: 10.8 10*3/mL

## 2017-03-25 LAB — HEPATIC FUNCTION PANEL
ALT: 31 U/L (ref 7–35)
AST: 24 U/L (ref 13–35)
Alkaline Phosphatase: 100 U/L (ref 25–125)
Bilirubin, Total: 0.3 mg/dL

## 2017-03-25 LAB — BASIC METABOLIC PANEL WITH GFR
BUN: 19 mg/dL (ref 4–21)
Creatinine: 0.7 mg/dL (ref 0.5–1.1)
Glucose: 101 mg/dL
Potassium: 4.4 mmol/L (ref 3.4–5.3)
Sodium: 143 mmol/L (ref 137–147)

## 2017-03-26 ENCOUNTER — Encounter: Payer: Self-pay | Admitting: Family Medicine

## 2017-03-30 ENCOUNTER — Ambulatory Visit (INDEPENDENT_AMBULATORY_CARE_PROVIDER_SITE_OTHER): Payer: Medicare Other | Admitting: Family Medicine

## 2017-03-30 ENCOUNTER — Encounter: Payer: Self-pay | Admitting: Family Medicine

## 2017-03-30 ENCOUNTER — Telehealth (HOSPITAL_COMMUNITY): Payer: Self-pay | Admitting: *Deleted

## 2017-03-30 VITALS — BP 140/90 | HR 87 | Resp 12 | Ht 64.0 in | Wt 235.5 lb

## 2017-03-30 DIAGNOSIS — D509 Iron deficiency anemia, unspecified: Secondary | ICD-10-CM | POA: Diagnosis not present

## 2017-03-30 DIAGNOSIS — G4733 Obstructive sleep apnea (adult) (pediatric): Secondary | ICD-10-CM | POA: Diagnosis not present

## 2017-03-30 DIAGNOSIS — R5383 Other fatigue: Secondary | ICD-10-CM | POA: Diagnosis not present

## 2017-03-30 DIAGNOSIS — R1013 Epigastric pain: Secondary | ICD-10-CM

## 2017-03-30 MED ORDER — FERROUS SULFATE 325 (65 FE) MG PO TABS: 325.0000 mg | ORAL_TABLET | Freq: Every day | ORAL | 3 refills | Status: DC

## 2017-03-30 MED ORDER — OMEPRAZOLE 40 MG PO CPDR: 40.0000 mg | DELAYED_RELEASE_CAPSULE | Freq: Every day | ORAL | 3 refills | Status: DC

## 2017-03-30 NOTE — Telephone Encounter (Signed)
Returned phone call to patient regarding ADD HD testing.

## 2017-03-30 NOTE — Progress Notes (Signed)
Pre visit review using our clinic review tool, if applicable. No additional management support is needed unless otherwise documented below in the visit note. 

## 2017-03-30 NOTE — Patient Instructions (Signed)
A few things to remember from today's visit:   Epigastric pain - Plan: omeprazole (PRILOSEC) 40 MG capsule  Anemia, unspecified type - Plan: Ferritin, CBC with Differential/Platelet, TSH, ferrous sulfate 325 (65 FE) MG tablet, Ambulatory referral to Gastroenterology   Please be sure medication list is accurate. If a new problem present, please set up appointment sooner than planned today.

## 2017-03-30 NOTE — Progress Notes (Signed)
HPI:   ACUTE VISIT:  Chief Complaint  Patient presents with  . Follow-up  . Anemia    Dawn Jordan is a 52 y.o. female, who is here today concerned about recent Hg of 7.7. She had routine lab work at Dr Ivor Reining office  Lab Results  Component Value Date   WBC 10.8 03/25/2017   HGB 7.7 (A) 03/25/2017   HCT 24 (A) 03/25/2017   MCV 83.4 12/07/2014   PLT 386 03/25/2017   CBC Latest Ref Rng & Units 03/25/2017 12/07/2014 12/07/2014  WBC 10:3/mL 10.8 6.6 7.2  Hemoglobin 12.0 - 16.0 g/dL 7.7(A) 10.2(L) 10.9(L)  Hematocrit 36 - 46 % 24(A) 32.2(L) 34.4(L)  Platelets 150 - 399 K/L 386 217 271    She has Hx of iron deficiency anemia for years,she is not on Fe Sulfate supplementation. LMP 2 years ago.  For a while she has had intermittent epigastric soreness, not radiated. She has not identifies exacerbating or alleviating factors. She has not noted heartburn.  12/08/14 occult blood card negative.  Denies nausea, vomiting, changes in bowel habits, blood in stool or melena.  She reports having colonoscopy over 10 years ago. Sometimes she craves for ice. Denies visual changes, chest pain, dyspnea, palpitation, claudication, focal weakness, or edema.  Hx of asthma,which she is reporting as well controlled.  + "Bad" headache parietotemporal, intermittently for about a month. She usually wakes up with headache, also "super tired." She does not feel rested and frequently she feels like falling asleep while doing chores around the house.  Hx of OSA, she is not wearing her CPAP.She would like to resume CPAP use, last sleep study about 3 years ago.  She also has Hx of RA, she follows with rheuma an currently on Enbrel. She takes Relafen 500 mg tid as needed.  BP today mildly elevated,no Hx of HTN. She does not check BP at home.   Review of Systems  Constitutional: Positive for activity change and fatigue. Negative for appetite change, fever and unexpected weight change.    HENT: Negative for mouth sores, nosebleeds and trouble swallowing.   Eyes: Negative for redness and visual disturbance.  Respiratory: Negative for cough, shortness of breath and wheezing.   Cardiovascular: Negative for chest pain, palpitations and leg swelling.  Gastrointestinal: Positive for abdominal pain. Negative for blood in stool, nausea and vomiting.       Negative for changes in bowel habits.  Endocrine: Negative for cold intolerance and heat intolerance.  Genitourinary: Negative for decreased urine volume and hematuria.  Skin: Positive for pallor. Negative for rash.  Allergic/Immunologic: Positive for environmental allergies.  Neurological: Positive for headaches. Negative for seizures, syncope, weakness and numbness.  Hematological: Negative for adenopathy. Does not bruise/bleed easily.  Psychiatric/Behavioral: Positive for sleep disturbance. Negative for confusion. The patient is not nervous/anxious.       Current Outpatient Prescriptions on File Prior to Visit  Medication Sig Dispense Refill  . albuterol (PROVENTIL HFA;VENTOLIN HFA) 108 (90 Base) MCG/ACT inhaler Inhale 2 puffs into the lungs every 6 (six) hours as needed for wheezing or shortness of breath. 1 Inhaler 0  . aspirin EC 81 MG tablet Take 1 tablet (81 mg total) by mouth daily.    Marland Kitchen atomoxetine (STRATTERA) 100 MG capsule Take 1 capsule (100 mg total) by mouth daily. 30 capsule 0  . budesonide-formoterol (SYMBICORT) 160-4.5 MCG/ACT inhaler Inhale 2 puffs into the lungs 2 (two) times daily. 1 Inhaler 5  . budesonide-formoterol (SYMBICORT) 80-4.5 MCG/ACT inhaler  Inhale 2 puffs into the lungs 2 (two) times daily. 1 Inhaler 3  . clonazePAM (KLONOPIN) 0.5 MG tablet Take 0.25 mg by mouth 2 (two) times daily as needed for anxiety.    . diclofenac sodium (VOLTAREN) 1 % GEL Apply to knees and back as needed.    . DULoxetine (CYMBALTA) 30 MG capsule Take 1 capsule (30 mg total) by mouth daily. 30 capsule 0  . ENBREL 50 MG/ML  injection Use 1 prefilled syringe once weekly    . flunisolide (NASALIDE) 25 MCG/ACT (0.025%) SOLN Place 1 spray into the nose 2 (two) times daily. 1 Bottle 5  . leflunomide (ARAVA) 20 MG tablet Take 20 mg by mouth daily.  0  . levalbuterol (XOPENEX) 1.25 MG/3ML nebulizer solution Take 1.25 mg by nebulization every 4 (four) hours as needed for wheezing. 75 mL 1  . levocetirizine (XYZAL) 5 MG tablet Take 1 tablet (5 mg total) by mouth every evening. 30 tablet 5  . LYRICA 200 MG capsule Take 3 capsules by mouth daily.   0  . montelukast (SINGULAIR) 10 MG tablet Take 1 tablet (10 mg total) by mouth at bedtime. 90 tablet 1  . nabumetone (RELAFEN) 500 MG tablet Take 500 mg by mouth 3 (three) times daily.   0  . nitroGLYCERIN (NITROSTAT) 0.4 MG SL tablet Place 1 tablet (0.4 mg total) under the tongue every 5 (five) minutes as needed for chest pain. 30 tablet 12   No current facility-administered medications on file prior to visit.      Past Medical History:  Diagnosis Date  . Allergy   . Anemia   . Anxiety   . Arthritis   . Asthma   . Depression   . OSA (obstructive sleep apnea)   . Rheumatism    Allergies  Allergen Reactions  . Latex Anaphylaxis, Hives and Itching    "Anaphylaxis is only when I am in a closed area (ex: car with balloons)"  . Codeine Itching    Social History   Social History  . Marital status: Legally Separated    Spouse name: N/A  . Number of children: N/A  . Years of education: N/A   Social History Main Topics  . Smoking status: Never Smoker  . Smokeless tobacco: Never Used  . Alcohol use No  . Drug use: No  . Sexual activity: Not Asked   Other Topics Concern  . None   Social History Narrative  . None    Vitals:   03/30/17 1606  BP: 140/90  Pulse: 87  Resp: 12  O2 sat at RA 96% Body mass index is 40.42 kg/m.   Physical Exam  Nursing note and vitals reviewed. Constitutional: She is oriented to person, place, and time. She appears  well-developed. No distress.  HENT:  Head: Atraumatic.  Mouth/Throat: Oropharynx is clear and moist and mucous membranes are normal.  Eyes: Conjunctivae and EOM are normal. Pupils are equal, round, and reactive to light.  Neck: No tracheal deviation present. No thyroid mass and no thyromegaly present.  Cardiovascular: Normal rate and regular rhythm.   No murmur heard. Pulses:      Dorsalis pedis pulses are 2+ on the right side, and 2+ on the left side.  Respiratory: Effort normal and breath sounds normal. No respiratory distress.  GI: Soft. She exhibits no mass. There is no hepatomegaly. There is tenderness in the epigastric area. There is no rigidity, no rebound and no guarding.  Musculoskeletal: She exhibits no edema.  Lymphadenopathy:    She has no cervical adenopathy.  Neurological: She is alert and oriented to person, place, and time. She has normal strength. Gait normal.  Skin: Skin is warm. No rash noted. No erythema. There is pallor.  Psychiatric: She has a normal mood and affect.  Well groomed, good eye contact.      ASSESSMENT AND PLAN:    Dawn Jordan was seen today for follow-up and anemia.  Diagnoses and all orders for this visit:  Iron deficiency anemia, unspecified iron deficiency anemia type  She may also have some anemia of chronic disease component. Fe Sulfate 325 mg daily,some side effects discussed. GI referral placed, she is going to need colonoscopy and maybe EGD. Instructed about warning signs. Further recommendations will be given according to lab results. F/U in 4 weeks.   -     Ferritin -     CBC with Differential/Platelet -     TSH -     ferrous sulfate 325 (65 FE) MG tablet; Take 1 tablet (325 mg total) by mouth daily with breakfast. -     Ambulatory referral to Gastroenterology  Epigastric pain  ? Gastritis/duodenitis. PUD could be contributing to anemia. She agrees with starting PPI treatment. GERD precautions discussed. F/U in 4 weeks.  -      omeprazole (PRILOSEC) 40 MG capsule; Take 1 capsule (40 mg total) by mouth daily before breakfast.  Other fatigue  In her case it could be related to some of her chronic medical problems: Depression, OSA, RA among some.  OSA (obstructive sleep apnea)  Some adverse effects of OSA discussed. Wt loss encouraged.  -     Ambulatory referral to Pulmonology     -Dawn Jordan was advised to return or notify a doctor immediately if symptoms worsen or new concerns arise.       Jannell Franta G. Martin[place], MD  [facility]. [place] office.

## 2017-03-31 ENCOUNTER — Encounter: Payer: Self-pay | Admitting: Family Medicine

## 2017-03-31 ENCOUNTER — Encounter: Payer: Self-pay | Admitting: Gastroenterology

## 2017-03-31 LAB — TSH: TSH: 0.73 u[IU]/mL (ref 0.35–4.50)

## 2017-03-31 LAB — CBC WITH DIFFERENTIAL/PLATELET
BASOS PCT: 1.4 % (ref 0.0–3.0)
Basophils Absolute: 0.1 10*3/uL (ref 0.0–0.1)
EOS ABS: 0.1 10*3/uL (ref 0.0–0.7)
Eosinophils Relative: 1.9 % (ref 0.0–5.0)
HCT: 36.6 % (ref 36.0–46.0)
Hemoglobin: 12.1 g/dL (ref 12.0–15.0)
LYMPHS PCT: 32.2 % (ref 12.0–46.0)
Lymphs Abs: 2.2 10*3/uL (ref 0.7–4.0)
MCHC: 33.2 g/dL (ref 30.0–36.0)
MCV: 82.2 fl (ref 78.0–100.0)
Monocytes Absolute: 0.4 10*3/uL (ref 0.1–1.0)
Monocytes Relative: 6.3 % (ref 3.0–12.0)
Neutro Abs: 4 10*3/uL (ref 1.4–7.7)
Neutrophils Relative %: 58.2 % (ref 43.0–77.0)
PLATELETS: 289 10*3/uL (ref 150.0–400.0)
RBC: 4.45 Mil/uL (ref 3.87–5.11)
RDW: 14.4 % (ref 11.5–15.5)
WBC: 6.9 10*3/uL (ref 4.0–10.5)

## 2017-03-31 LAB — FERRITIN: Ferritin: 56.4 ng/mL (ref 10.0–291.0)

## 2017-04-01 ENCOUNTER — Telehealth (HOSPITAL_COMMUNITY): Payer: Self-pay | Admitting: *Deleted

## 2017-04-01 NOTE — Telephone Encounter (Signed)
Phone call from patient, she said Dr. Modesta Messing wanted her to have ADD testing before her next appointment and her next appointment is coming up and she does not have information as to where she is to go for testing.

## 2017-04-01 NOTE — Telephone Encounter (Signed)
Called referral location and lm on ref coordinator name Corene Cornea to call staff back update on ref for pt. Office number

## 2017-04-01 NOTE — Telephone Encounter (Signed)
Called referral location and lm on ref cordinator name Corene Cornea to call staff back update on ref for pt. Office number provided.

## 2017-04-05 ENCOUNTER — Telehealth (HOSPITAL_COMMUNITY): Payer: Self-pay | Admitting: *Deleted

## 2017-04-05 NOTE — Telephone Encounter (Signed)
Patient returned phone call. °

## 2017-04-05 NOTE — Telephone Encounter (Signed)
Called pt due to previous message. Unable to reachpt. Pt phone rang three times and went to voicemail. lmtcb

## 2017-04-05 NOTE — Telephone Encounter (Signed)
Called pt and unable to reach. lmtcb

## 2017-04-06 ENCOUNTER — Ambulatory Visit (HOSPITAL_COMMUNITY): Payer: Self-pay | Admitting: Psychiatry

## 2017-04-06 ENCOUNTER — Other Ambulatory Visit (INDEPENDENT_AMBULATORY_CARE_PROVIDER_SITE_OTHER): Payer: Medicare Other

## 2017-04-06 ENCOUNTER — Telehealth: Payer: Self-pay | Admitting: Gastroenterology

## 2017-04-06 ENCOUNTER — Institutional Professional Consult (permissible substitution): Payer: Self-pay | Admitting: Pulmonary Disease

## 2017-04-06 ENCOUNTER — Encounter: Payer: Self-pay | Admitting: Gastroenterology

## 2017-04-06 ENCOUNTER — Ambulatory Visit (INDEPENDENT_AMBULATORY_CARE_PROVIDER_SITE_OTHER): Payer: Medicare Other | Admitting: Gastroenterology

## 2017-04-06 ENCOUNTER — Ambulatory Visit (INDEPENDENT_AMBULATORY_CARE_PROVIDER_SITE_OTHER): Payer: Medicare Other | Admitting: Psychiatry

## 2017-04-06 VITALS — BP 126/80 | HR 76 | Ht 64.0 in | Wt 232.0 lb

## 2017-04-06 DIAGNOSIS — R1013 Epigastric pain: Secondary | ICD-10-CM | POA: Diagnosis not present

## 2017-04-06 DIAGNOSIS — K529 Noninfective gastroenteritis and colitis, unspecified: Secondary | ICD-10-CM | POA: Diagnosis not present

## 2017-04-06 DIAGNOSIS — F331 Major depressive disorder, recurrent, moderate: Secondary | ICD-10-CM

## 2017-04-06 DIAGNOSIS — K219 Gastro-esophageal reflux disease without esophagitis: Secondary | ICD-10-CM | POA: Diagnosis not present

## 2017-04-06 DIAGNOSIS — Z1211 Encounter for screening for malignant neoplasm of colon: Secondary | ICD-10-CM | POA: Insufficient documentation

## 2017-04-06 DIAGNOSIS — F909 Attention-deficit hyperactivity disorder, unspecified type: Secondary | ICD-10-CM | POA: Diagnosis not present

## 2017-04-06 LAB — CBC WITH DIFFERENTIAL/PLATELET
BASOS PCT: 0.5 % (ref 0.0–3.0)
Basophils Absolute: 0 10*3/uL (ref 0.0–0.1)
EOS PCT: 1.5 % (ref 0.0–5.0)
Eosinophils Absolute: 0.1 10*3/uL (ref 0.0–0.7)
HEMATOCRIT: 38 % (ref 36.0–46.0)
HEMOGLOBIN: 12.7 g/dL (ref 12.0–15.0)
LYMPHS PCT: 32.9 % (ref 12.0–46.0)
Lymphs Abs: 2.8 10*3/uL (ref 0.7–4.0)
MCHC: 33.5 g/dL (ref 30.0–36.0)
MCV: 81.2 fl (ref 78.0–100.0)
MONOS PCT: 6.7 % (ref 3.0–12.0)
Monocytes Absolute: 0.6 10*3/uL (ref 0.1–1.0)
Neutro Abs: 4.9 10*3/uL (ref 1.4–7.7)
Neutrophils Relative %: 58.4 % (ref 43.0–77.0)
Platelets: 285 10*3/uL (ref 150.0–400.0)
RBC: 4.68 Mil/uL (ref 3.87–5.11)
RDW: 14.1 % (ref 11.5–15.5)
WBC: 8.5 10*3/uL (ref 4.0–10.5)

## 2017-04-06 LAB — IGA: IGA: 55 mg/dL — AB (ref 68–378)

## 2017-04-06 LAB — IBC PANEL
Iron: 41 ug/dL — ABNORMAL LOW (ref 42–145)
Saturation Ratios: 11.9 % — ABNORMAL LOW (ref 20.0–50.0)
Transferrin: 246 mg/dL (ref 212.0–360.0)

## 2017-04-06 LAB — FERRITIN: Ferritin: 60.6 ng/mL (ref 10.0–291.0)

## 2017-04-06 LAB — VITAMIN B12: VITAMIN B 12: 574 pg/mL (ref 211–911)

## 2017-04-06 MED ORDER — NA SULFATE-K SULFATE-MG SULF 17.5-3.13-1.6 GM/177ML PO SOLN: 1.0000 | ORAL | 0 refills | Status: DC

## 2017-04-06 NOTE — Progress Notes (Signed)
04/06/2017 Dawn Jordan [id number] 1966-10-17   HISTORY OF PRESENT ILLNESS:  This is a 51 year old female who is new to our office and was referred here by her PCP, Dr. Betty Martinique, for evaluation of iron deficiency anemia.  It appears that the patient had a hemoglobin drawn on April 19 that resulted as 7.7 g. She was not given any iron infusion or blood transfusion and 5 days later her hemoglobin was 12.1 g. MCV normal at 82.2. Ferritin was normal at 56.4. Other iron studies were not drawn. Patient was placed on oral iron but admits that she has not been taking it. She says that she had a colonoscopy about 25 years ago that was performed for complaints of chronic diarrhea.  She says that she still continues with diarrhea/loose stools, but has just learned to deal with and live with it. She does admit to some small amounts of bright red blood upon wiping at times; she contributes to hemorrhoids.    She also reports epigastric abdominal pain. Says that she has acid reflux, but only with eating certain foods. Does not take any medicine for her acid reflux.   Past Medical History:  Diagnosis Date  . Allergy   . Anemia   . Anxiety   . Arthritis   . Asthma   . Depression   . Fibromyalgia   . OSA (obstructive sleep apnea)   . Rheumatism    Past Surgical History:  Procedure Laterality Date  . CHOLECYSTECTOMY    . CYST EXCISION     to hand  . KNEE ARTHROSCOPY     Both knees  . TONSILLECTOMY      reports that she has never smoked. She has never used smokeless tobacco. She reports that she does not drink alcohol or use drugs. family history includes Allergic rhinitis in her brother and mother; Arthritis in her mother; Cancer in her paternal grandmother; Colon cancer in her paternal aunt; Diabetes in her maternal grandfather, mother, and paternal grandmother; Hyperlipidemia in her mother; Hypertension in her maternal grandfather, mother, and paternal grandmother; Lung cancer in her  maternal uncle and other; Mental retardation in her mother; Stroke in her mother. Allergies  Allergen Reactions  . Latex Anaphylaxis, Hives and Itching    "Anaphylaxis is only when I am in a closed area (ex: car with balloons)"  . Codeine Itching      Outpatient Encounter Prescriptions as of 04/06/2017  Medication Sig  . albuterol (PROVENTIL HFA;VENTOLIN HFA) 108 (90 Base) MCG/ACT inhaler Inhale 2 puffs into the lungs every 6 (six) hours as needed for wheezing or shortness of breath.  Marland Kitchen aspirin EC 81 MG tablet Take 1 tablet (81 mg total) by mouth daily.  Marland Kitchen atomoxetine (STRATTERA) 100 MG capsule Take 1 capsule (100 mg total) by mouth daily.  . budesonide-formoterol (SYMBICORT) 80-4.5 MCG/ACT inhaler Inhale 2 puffs into the lungs 2 (two) times daily.  . clonazePAM (KLONOPIN) 0.5 MG tablet Take 0.25 mg by mouth 2 (two) times daily as needed for anxiety.  . diclofenac sodium (VOLTAREN) 1 % GEL Apply to knees and back as needed.  . DULoxetine (CYMBALTA) 30 MG capsule Take 1 capsule (30 mg total) by mouth daily.  . ENBREL 50 MG/ML injection Use 1 prefilled syringe once weekly  . ferrous sulfate 325 (65 FE) MG tablet Take 1 tablet (325 mg total) by mouth daily with breakfast.  . flunisolide (NASALIDE) 25 MCG/ACT (0.025%) SOLN Place 1 spray into the nose 2 (two) times daily.  Marland Kitchen  levalbuterol (XOPENEX) 1.25 MG/3ML nebulizer solution Take 1.25 mg by nebulization every 4 (four) hours as needed for wheezing.  Marland Kitchen levocetirizine (XYZAL) 5 MG tablet Take 1 tablet (5 mg total) by mouth every evening.  Marland Kitchen LYRICA 200 MG capsule Take 3 capsules by mouth daily.   . montelukast (SINGULAIR) 10 MG tablet Take 1 tablet (10 mg total) by mouth at bedtime.  . nabumetone (RELAFEN) 500 MG tablet Take 500 mg by mouth 3 (three) times daily.   . nitroGLYCERIN (NITROSTAT) 0.4 MG SL tablet Place 1 tablet (0.4 mg total) under the tongue every 5 (five) minutes as needed for chest pain.  Marland Kitchen omeprazole (PRILOSEC) 40 MG capsule Take  1 capsule (40 mg total) by mouth daily before breakfast.  . [DISCONTINUED] budesonide-formoterol (SYMBICORT) 160-4.5 MCG/ACT inhaler Inhale 2 puffs into the lungs 2 (two) times daily.  Marland Kitchen leflunomide (ARAVA) 20 MG tablet Take 20 mg by mouth daily.   No facility-administered encounter medications on file as of 04/06/2017.      REVIEW OF SYSTEMS  : All other systems reviewed and negative except where noted in the History of Present Illness.   PHYSICAL EXAM: BP 126/80   Pulse 76   Ht 5\' 4"  (1.626 m)   Wt 232 lb (105.2 kg)   LMP  (LMP Unknown)   BMI 39.82 kg/m  General: Well developed white female in no acute distress Head: Normocephalic and atraumatic Eyes:  Sclerae anicteric, conjunctiva pink. Ears: Normal auditory acuity Lungs: Clear throughout to auscultation; no increased WOB. Heart: Regular rate and rhythm Abdomen: Soft, non-distended. Normal bowel sounds  Mild epigastric TTP, but mostly tender over lower ribcage. Rectal:  Will be done at the time of colonoscopy. Musculoskeletal: Symmetrical with no gross deformities  Skin: No lesions on visible extremities Extremities: No edema  Neurological: Alert oriented x 4, grossly nonfocal Cervical Nodes:  No significant cervical adenopathy Psychological:  Alert and cooperative. Normal mood and affect  ASSESSMENT AND PLAN: -IDA:  Unsure that this is accurate according to labs.  We are going to recheck CBC along with iron studies today.  Will also check celiac labs. -GERD and epigastric pain:  Some of her epi pain may be due to GERD/esophagitis, however, she is mostly tender over her lower ribcage so it is most likely musculoskeletal.  Nonetheless, with the IDA as well we will also schedule for EGD. -Screening colonoscopy:  Will schedule with Dr. Ardis Hughs. -Chronic diarrhea:  Will ask Dr. Ardis Hughs to perform random colonic biopsies during colonoscopy to rule out microscopic colitis.  *The risks, benefits, and alternatives to EGD and  colonoscopy were discussed with the patient and she consents to proceed.  **Of note, her IgA level resulted before I completed this note and is low.  Please perform duodenal biopsies to rule out celiac disease as well.   CC:  Martin[place], Betty G, MD

## 2017-04-06 NOTE — Telephone Encounter (Signed)
See result note.  

## 2017-04-06 NOTE — Telephone Encounter (Signed)
Patient returning Linda's call about lab results from today 5.1.18

## 2017-04-06 NOTE — Patient Instructions (Signed)
You have been scheduled for a colonoscopy. Please follow written instructions given to you at your visit today.  Please pick up your prep supplies at the pharmacy within the next 1-3 days. If you use inhalers (even only as needed), please bring them with you on the day of your procedure. Your physician has requested that you go to www.startemmi.com and enter the access code given to you at your visit today. This web site gives a general overview about your procedure. However, you should still follow specific instructions given to you by our office regarding your preparation for the procedure.  Your physician has requested that you go to the basement for lab work before leaving today.  

## 2017-04-07 ENCOUNTER — Encounter (HOSPITAL_COMMUNITY): Payer: Self-pay | Admitting: Psychiatry

## 2017-04-07 ENCOUNTER — Ambulatory Visit (INDEPENDENT_AMBULATORY_CARE_PROVIDER_SITE_OTHER): Payer: Medicare Other | Admitting: Psychiatry

## 2017-04-07 VITALS — BP 118/80 | HR 78 | Ht 64.0 in | Wt 231.0 lb

## 2017-04-07 DIAGNOSIS — Z79899 Other long term (current) drug therapy: Secondary | ICD-10-CM | POA: Diagnosis not present

## 2017-04-07 DIAGNOSIS — F331 Major depressive disorder, recurrent, moderate: Secondary | ICD-10-CM

## 2017-04-07 DIAGNOSIS — Z810 Family history of intellectual disabilities: Secondary | ICD-10-CM

## 2017-04-07 DIAGNOSIS — F909 Attention-deficit hyperactivity disorder, unspecified type: Secondary | ICD-10-CM

## 2017-04-07 DIAGNOSIS — Z818 Family history of other mental and behavioral disorders: Secondary | ICD-10-CM | POA: Diagnosis not present

## 2017-04-07 LAB — TISSUE TRANSGLUTAMINASE, IGA: TISSUE TRANSGLUTAMINASE AB, IGA: 1 U/mL (ref <4)

## 2017-04-07 MED ORDER — DULOXETINE HCL 60 MG PO CPEP: 60.0000 mg | ORAL_CAPSULE | Freq: Every day | ORAL | 1 refills | Status: DC

## 2017-04-07 MED ORDER — ATOMOXETINE HCL 100 MG PO CAPS: 100.0000 mg | ORAL_CAPSULE | Freq: Every day | ORAL | 1 refills | Status: DC

## 2017-04-07 NOTE — Progress Notes (Signed)
I agree with the above note, plan.  The Hb of 7.7 must have been incorrect.
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
## 2017-04-07 NOTE — Patient Instructions (Signed)
1. Continue Strattera 100 mg daily 2. Increase duloxetine 60 mg daily 3. Return to clinic in one month

## 2017-04-08 NOTE — Telephone Encounter (Signed)
Testing was made yesterday when she came into office for f/u
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
## 2017-04-19 ENCOUNTER — Encounter: Payer: Medicare Other | Attending: Psychology | Admitting: Psychology

## 2017-04-19 DIAGNOSIS — F331 Major depressive disorder, recurrent, moderate: Secondary | ICD-10-CM | POA: Diagnosis not present

## 2017-04-19 DIAGNOSIS — F419 Anxiety disorder, unspecified: Secondary | ICD-10-CM | POA: Diagnosis not present

## 2017-04-19 DIAGNOSIS — F4310 Post-traumatic stress disorder, unspecified: Secondary | ICD-10-CM | POA: Diagnosis not present

## 2017-04-19 DIAGNOSIS — F4312 Post-traumatic stress disorder, chronic: Secondary | ICD-10-CM | POA: Diagnosis not present

## 2017-04-19 DIAGNOSIS — F329 Major depressive disorder, single episode, unspecified: Secondary | ICD-10-CM | POA: Diagnosis present

## 2017-05-03 ENCOUNTER — Encounter: Payer: Self-pay | Admitting: Psychology
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
## 2017-05-05 ENCOUNTER — Encounter: Payer: Self-pay | Admitting: Pulmonary Disease

## 2017-05-05 ENCOUNTER — Telehealth (HOSPITAL_COMMUNITY): Payer: Self-pay | Admitting: *Deleted

## 2017-05-05 ENCOUNTER — Ambulatory Visit (INDEPENDENT_AMBULATORY_CARE_PROVIDER_SITE_OTHER): Payer: Medicare Other | Admitting: Pulmonary Disease

## 2017-05-05 VITALS — BP 124/78 | HR 83 | Ht 64.0 in | Wt 232.0 lb

## 2017-05-05 DIAGNOSIS — G4733 Obstructive sleep apnea (adult) (pediatric): Secondary | ICD-10-CM

## 2017-05-05 NOTE — Assessment & Plan Note (Signed)
Patient was diagnosed with sleep apnea in 2008. She was very symptomatic with snoring, gasping, choking, fatigue, unrefreshed sleep. She had a lab sleep study done in [place], [place] and was diagnosed with severe sleep apnea. She was started on CPAP therapy and significantly improved. She felt benefit of CPAP therapy. Unknown settings. Unfortunately, she got busy taking care of her mother. She eventually stopped using her CPAP. Her symptoms persisted, worsening. She remains very sleepy, has hypersomnia, unrefreshed sleep, witnessed apneas, gasping, choking.  She denies abnormal behavior and sleep other than sleep talk.  She is frequent awakenings at night as well as panic attacks.  ESS 15.   Plan :   We discussed about the diagnosis of Obstructive Sleep Apnea (OSA) and implications of untreated OSA. We discussed about CPAP and BiPaP as possible treatment options.   We will schedule the patient for a sleep study. Plan for home study. She wants to have it done sooner. If it were a lab study, he wanted done until after couple months. If the home study is negative, she is okay with having a lab study. Anticipate she'll do okay on auto CPAP unless she has severe sleep apnea for which she will need a titration study.   Patient was instructed to call the office if he/she has not heard back from the office 1-2 weeks after the sleep study.   Patient was instructed to call the office if he/she is having issues with the PAP device.   We discussed good sleep hygiene.   Patient was advised not to engage in activities requiring concentration and/or vigilance if he/she is sleepy.  Patient was advised not to drive if he/she is sleepy.

## 2017-05-05 NOTE — Telephone Encounter (Signed)
returned phone call to patient.  She cancelled 05/07/17, and said she would call back to schedule after she finishes testing with the psychologist.

## 2017-05-05 NOTE — Patient Instructions (Addendum)
  It was a pleasure taking care of you today!  We will schedule you to have a sleep study to determine if you have sleep apnea.   We will get a home sleep test.  You will be instructed to come back to the office to get an apparatus to sleep with overnight.  Once we have the apparatus, it will usually take [name] 1-2 weeks to read the study and get back at you with results of the test.  Please give [name] a call in 2 weeks after your study if you do not hear back from [name].    If the sleep study is positive, we will order you a CPAP  machine.  Please call the office if you do NOT receive your machine in the next 1-2 weeks.   Please make sure you use your CPAP device everytime you sleep.  We will monitor the usage of your machine per your insurance requirement.  Your insurance company may take the machine from you if you are not using it regularly.   Please clean the mask, tubings, filter, water reservoir with soapy water every week.  Please use distilled water for the water reservoir.   Please call the office or your machine provider (DME company) if you are having issues with the device.   Return to clinic in 10 weeks with  NP   .
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
## 2017-05-05 NOTE — Progress Notes (Signed)
Subjective:    Patient ID: Dawn Jordan, female    DOB: 1966-11-22, 51 y.o.   MRN: [id number]  HPI    This is the case of Dawn Jordan, 51 y.o. Female, who was referred by Dr. Berry Martinique  in consultation regarding OSA  As you very well know, patient is a non smoker, is diagnosed with asthma for which she takes singulair, zyxal, and xopenex.   Patient was diagnosed with sleep apnea in 2008. She was very symptomatic with snoring, gasping, choking, fatigue, unrefreshed sleep. She had a lab sleep study done in [place], [place] and was diagnosed with severe sleep apnea. She was started on CPAP therapy and significantly improved. She felt benefit of CPAP therapy. Unknown settings. Unfortunately, she got busy taking care of her mother. She eventually stopped using her CPAP. Her symptoms persisted, worsening. She remains very sleepy, has hypersomnia, unrefreshed sleep, witnessed apneas, gasping, choking.  She denies abnormal behavior and sleep other than sleep talk.  She is frequent awakenings at night as well as panic attacks.  ESS 15.    Review of Systems  Constitutional: Negative.  Negative for fever and unexpected weight change.  HENT: Negative.  Negative for congestion, dental problem, ear pain, nosebleeds, postnasal drip, rhinorrhea, sinus pressure, sneezing, sore throat and trouble swallowing.   Eyes: Positive for itching. Negative for redness.  Respiratory: Positive for shortness of breath. Negative for cough, chest tightness and wheezing.   Cardiovascular: Negative.  Negative for palpitations and leg swelling.  Gastrointestinal: Negative.  Negative for nausea and vomiting.  Endocrine: Negative.   Genitourinary: Negative.  Negative for dysuria.  Musculoskeletal: Positive for joint swelling.  Skin: Negative.  Negative for rash.  Allergic/Immunologic: Negative.  Negative for environmental allergies, food allergies and immunocompromised state.  Neurological: Positive for  headaches.  Hematological: Negative.  Does not bruise/bleed easily.  Psychiatric/Behavioral: Negative.  Negative for dysphoric mood. The patient is not nervous/anxious.    Past Medical History:  Diagnosis Date  . Allergy   . Anemia   . Anxiety   . Arthritis   . Asthma   . Depression   . Fibromyalgia   . OSA (obstructive sleep apnea)   . Rheumatism    (-) CA or DVT  Family History  Problem Relation Age of Onset  . Arthritis Mother   . Hyperlipidemia Mother   . Hypertension Mother   . Stroke Mother   . Mental retardation Mother   . Diabetes Mother   . Allergic rhinitis Mother   . Diabetes Maternal Grandfather   . Hypertension Maternal Grandfather   . Diabetes Paternal Grandmother   . Hypertension Paternal Grandmother   . Cancer Paternal Grandmother        breast  . Allergic rhinitis Brother   . Colon cancer Paternal Aunt   . Lung cancer Other   . Lung cancer Maternal Uncle   . Asthma Neg Hx   . Eczema Neg Hx   . Immunodeficiency Neg Hx   . Urticaria Neg Hx   . Atopy Neg Hx   . Angioedema Neg Hx      Past Surgical History:  Procedure Laterality Date  . CHOLECYSTECTOMY    . CYST EXCISION     to hand  . KNEE ARTHROSCOPY     Both knees  . TONSILLECTOMY      Social History   Social History  . Marital status: Legally Separated    Spouse name: N/A  . Number of children: 0  .  Years of education: N/A   Occupational History  . not employed-disabled    Social History Main Topics  . Smoking status: Never Smoker  . Smokeless tobacco: Never Used  . Alcohol use No  . Drug use: No  . Sexual activity: Not on file   Other Topics Concern  . Not on file   Social History Narrative  . No narrative on file   she is single, no children. Lives in [place]. She is on disability 2/2 bipolar + ADHD.   Allergies  Allergen Reactions  . Latex Anaphylaxis, Hives and Itching    "Anaphylaxis is only when I am in a closed area (ex: car with balloons)"  . Codeine Itching      Outpatient Medications Prior to Visit  Medication Sig Dispense Refill  . albuterol (PROVENTIL HFA;VENTOLIN HFA) 108 (90 Base) MCG/ACT inhaler Inhale 2 puffs into the lungs every 6 (six) hours as needed for wheezing or shortness of breath. 1 Inhaler 0  . aspirin EC 81 MG tablet Take 1 tablet (81 mg total) by mouth daily.    Marland Kitchen atomoxetine (STRATTERA) 100 MG capsule Take 1 capsule (100 mg total) by mouth daily. 30 capsule 1  . budesonide-formoterol (SYMBICORT) 80-4.5 MCG/ACT inhaler Inhale 2 puffs into the lungs 2 (two) times daily. 1 Inhaler 3  . clonazePAM (KLONOPIN) 0.5 MG tablet Take 0.25 mg by mouth 2 (two) times daily as needed for anxiety.    . diclofenac sodium (VOLTAREN) 1 % GEL Apply to knees and back as needed.    . DULoxetine (CYMBALTA) 60 MG capsule Take 1 capsule (60 mg total) by mouth daily. 30 capsule 1  . ENBREL 50 MG/ML injection Use 1 prefilled syringe once weekly    . flunisolide (NASALIDE) 25 MCG/ACT (0.025%) SOLN Place 1 spray into the nose 2 (two) times daily. 1 Bottle 5  . leflunomide (ARAVA) 20 MG tablet Take 20 mg by mouth daily.  0  . levalbuterol (XOPENEX) 1.25 MG/3ML nebulizer solution Take 1.25 mg by nebulization every 4 (four) hours as needed for wheezing. 75 mL 1  . levocetirizine (XYZAL) 5 MG tablet Take 1 tablet (5 mg total) by mouth every evening. 30 tablet 5  . LYRICA 200 MG capsule Take 3 capsules by mouth daily.   0  . montelukast (SINGULAIR) 10 MG tablet Take 1 tablet (10 mg total) by mouth at bedtime. 90 tablet 1  . Na Sulfate-K Sulfate-Mg Sulf 17.5-3.13-1.6 GM/180ML SOLN Take 1 kit by mouth as directed. 354 mL 0  . nabumetone (RELAFEN) 500 MG tablet Take 500 mg by mouth 3 (three) times daily.   0  . nitroGLYCERIN (NITROSTAT) 0.4 MG SL tablet Place 1 tablet (0.4 mg total) under the tongue every 5 (five) minutes as needed for chest pain. 30 tablet 12  . omeprazole (PRILOSEC) 40 MG capsule Take 1 capsule (40 mg total) by mouth daily before breakfast. 30  capsule 3  . ferrous sulfate 325 (65 FE) MG tablet Take 1 tablet (325 mg total) by mouth daily with breakfast. 90 tablet 3   No facility-administered medications prior to visit.    No orders of the defined types were placed in this encounter.       Objective:   Physical Exam Vitals:  Vitals:   05/05/17 1439  BP: 124/78  Pulse: 83  SpO2: 99%  Weight: 232 lb (105.2 kg)  Height: 5' 4" (1.626 m)    Constitutional/General:  Pleasant, well-nourished, well-developed, not in any distress,  Comfortably seating.  Well kempt  Body mass index is 39.82 kg/m. Wt Readings from Last 3 Encounters:  05/05/17 232 lb (105.2 kg)  04/06/17 232 lb (105.2 kg)  03/30/17 235 lb 8 oz (106.8 kg)    HEENT: Pupils equal and reactive to light and accommodation. Anicteric sclerae. Normal nasal mucosa.   No oral  lesions,  mouth clear,  oropharynx clear, no postnasal drip. (-) Oral thrush. No dental caries.  Airway - Mallampati class III- IV  Neck: No masses. Midline trachea. No JVD, (-) LAD. (-) bruits appreciated.  Respiratory/Chest: Grossly normal chest. (-) deformity. (-) Accessory muscle use.  Symmetric expansion. (-) Tenderness on palpation.  Resonant on percussion.  Diminished BS on both lower lung zones. (-) wheezing, crackles, rhonchi (-) egophony  Cardiovascular: Regular rate and  rhythm, heart sounds normal, no murmur or gallops, no peripheral edema  Gastrointestinal:  Normal bowel sounds. Soft, non-tender. No hepatosplenomegaly.  (-) masses.   Musculoskeletal:  Normal muscle tone. Normal gait.   Extremities: Grossly normal. (-) clubbing, cyanosis.  (-) edema  Skin: (-) rash,lesions seen.   Neurological/Psychiatric : alert, oriented to time, place, person. Normal mood and affect           Assessment & Plan:  OSA (obstructive sleep apnea) Patient was diagnosed with sleep apnea in 2008. She was very symptomatic with snoring, gasping, choking, fatigue, unrefreshed  sleep. She had a lab sleep study done in [place], [place] and was diagnosed with severe sleep apnea. She was started on CPAP therapy and significantly improved. She felt benefit of CPAP therapy. Unknown settings. Unfortunately, she got busy taking care of her mother. She eventually stopped using her CPAP. Her symptoms persisted, worsening. She remains very sleepy, has hypersomnia, unrefreshed sleep, witnessed apneas, gasping, choking.  She denies abnormal behavior and sleep other than sleep talk.  She is frequent awakenings at night as well as panic attacks.  ESS 15.   Plan :   We discussed about the diagnosis of Obstructive Sleep Apnea (OSA) and implications of untreated OSA. We discussed about CPAP and BiPaP as possible treatment options.   We will schedule the patient for a sleep study. Plan for home study. She wants to have it done sooner. If it were a lab study, he wanted done until after couple months. If the home study is negative, she is okay with having a lab study. Anticipate she'll do okay on auto CPAP unless she has severe sleep apnea for which she will need a titration study.   Patient was instructed to call the office if he/she has not heard back from the office 1-2 weeks after the sleep study.   Patient was instructed to call the office if he/she is having issues with the PAP device.   We discussed good sleep hygiene.   Patient was advised not to engage in activities requiring concentration and/or vigilance if he/she is sleepy.  Patient was advised not to drive if he/she is sleepy.       Thank you very much for letting me participate in this patient's care. Please do not hesitate to give me a call if you have any questions or concerns regarding the treatment plan.   Patient will follow up with [name] in 8-10 weeks.     Monica Becton, MD 05/05/2017   3:40 PM [facility] Pager: [phone] Office: [phone],  Fax: [phone]

## 2017-05-07 ENCOUNTER — Ambulatory Visit (HOSPITAL_COMMUNITY): Payer: Self-pay | Admitting: Psychiatry

## 2017-05-17 ENCOUNTER — Encounter: Payer: Medicare Other | Admitting: Psychology

## 2017-05-17 ENCOUNTER — Telehealth: Payer: Self-pay

## 2017-05-17 NOTE — Telephone Encounter (Signed)
Called and spoke with pt and she stated that she does not want to have her insurance billed 3 times to have all the tests that AD was talking about to the pt.  She stated that he spoke with her about having a home sleep test and then having to go into the lab for another test.  Pt stated that she would rather go and have the sleep test in the lab to begin with.  TP please advise since AD is not longer here. Can we schedule the pt for the sleep lab for her sleep study?  Thanks

## 2017-05-17 NOTE — Telephone Encounter (Signed)
That is fine to set up for Split night sleep study  Make sure she has follow up ov after study .  I guess order will have to go under my name

## 2017-05-17 NOTE — Telephone Encounter (Signed)
LMTCB

## 2017-05-18 NOTE — Telephone Encounter (Signed)
lmtcb x2 for pt. 

## 2017-05-19 NOTE — Telephone Encounter (Signed)
lmtcb x3 for pt. Per triage protocol, message will be closed.  

## 2017-05-24 DIAGNOSIS — E669 Obesity, unspecified: Secondary | ICD-10-CM | POA: Diagnosis not present

## 2017-05-24 DIAGNOSIS — M797 Fibromyalgia: Secondary | ICD-10-CM | POA: Diagnosis not present

## 2017-05-24 DIAGNOSIS — M7989 Other specified soft tissue disorders: Secondary | ICD-10-CM | POA: Diagnosis not present

## 2017-05-24 DIAGNOSIS — M0609 Rheumatoid arthritis without rheumatoid factor, multiple sites: Secondary | ICD-10-CM | POA: Diagnosis not present

## 2017-05-24 DIAGNOSIS — R5382 Chronic fatigue, unspecified: Secondary | ICD-10-CM | POA: Diagnosis not present

## 2017-05-24 DIAGNOSIS — Z6838 Body mass index (BMI) 38.0-38.9, adult: Secondary | ICD-10-CM | POA: Diagnosis not present

## 2017-05-24 DIAGNOSIS — M2550 Pain in unspecified joint: Secondary | ICD-10-CM | POA: Diagnosis not present

## 2017-05-26 ENCOUNTER — Ambulatory Visit (INDEPENDENT_AMBULATORY_CARE_PROVIDER_SITE_OTHER): Payer: Medicare Other | Admitting: Psychiatry

## 2017-05-26 ENCOUNTER — Encounter: Payer: Self-pay | Admitting: Gastroenterology

## 2017-05-26 ENCOUNTER — Encounter (HOSPITAL_COMMUNITY): Payer: Self-pay | Admitting: Psychiatry

## 2017-05-26 VITALS — BP 120/84 | HR 73 | Ht 64.0 in | Wt 234.0 lb

## 2017-05-26 DIAGNOSIS — Z810 Family history of intellectual disabilities: Secondary | ICD-10-CM

## 2017-05-26 DIAGNOSIS — Z818 Family history of other mental and behavioral disorders: Secondary | ICD-10-CM

## 2017-05-26 DIAGNOSIS — G4700 Insomnia, unspecified: Secondary | ICD-10-CM

## 2017-05-26 DIAGNOSIS — F909 Attention-deficit hyperactivity disorder, unspecified type: Secondary | ICD-10-CM | POA: Diagnosis not present

## 2017-05-26 DIAGNOSIS — F331 Major depressive disorder, recurrent, moderate: Secondary | ICD-10-CM

## 2017-05-26 MED ORDER — DULOXETINE HCL 30 MG PO CPEP: 90.0000 mg | ORAL_CAPSULE | Freq: Every day | ORAL | 1 refills | Status: DC

## 2017-05-26 MED ORDER — ATOMOXETINE HCL 100 MG PO CAPS: 100.0000 mg | ORAL_CAPSULE | Freq: Every day | ORAL | 1 refills | Status: DC

## 2017-05-26 NOTE — Patient Instructions (Addendum)
1. Continue Strattera 100 mg daily 2. Increase duloxetine 90 mg daily 3. Return to clinic in one month for 30 mins 4. Contact to make an appointment with a therapist   Phone: [phone] 1142, [address] [place], [place], [place] [address]
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
## 2017-06-04 ENCOUNTER — Encounter (HOSPITAL_COMMUNITY): Payer: Self-pay | Admitting: Emergency Medicine

## 2017-06-04 ENCOUNTER — Observation Stay (HOSPITAL_COMMUNITY)
Admission: EM | Admit: 2017-06-04 | Discharge: 2017-06-05 | Disposition: A | Discharge status: Home / Self Care | Payer: Medicare Other | Attending: Internal Medicine | Admitting: Internal Medicine

## 2017-06-04 ENCOUNTER — Emergency Department (HOSPITAL_COMMUNITY): Payer: Medicare Other

## 2017-06-04 ENCOUNTER — Other Ambulatory Visit: Payer: Self-pay

## 2017-06-04 DIAGNOSIS — J45909 Unspecified asthma, uncomplicated: Secondary | ICD-10-CM | POA: Diagnosis not present

## 2017-06-04 DIAGNOSIS — Z91040 Latex allergy status: Secondary | ICD-10-CM | POA: Diagnosis not present

## 2017-06-04 DIAGNOSIS — F909 Attention-deficit hyperactivity disorder, unspecified type: Secondary | ICD-10-CM | POA: Diagnosis present

## 2017-06-04 DIAGNOSIS — F321 Major depressive disorder, single episode, moderate: Secondary | ICD-10-CM | POA: Diagnosis not present

## 2017-06-04 DIAGNOSIS — M069 Rheumatoid arthritis, unspecified: Secondary | ICD-10-CM | POA: Diagnosis present

## 2017-06-04 DIAGNOSIS — R079 Chest pain, unspecified: Secondary | ICD-10-CM | POA: Diagnosis not present

## 2017-06-04 DIAGNOSIS — Z79899 Other long term (current) drug therapy: Secondary | ICD-10-CM | POA: Insufficient documentation

## 2017-06-04 DIAGNOSIS — K219 Gastro-esophageal reflux disease without esophagitis: Secondary | ICD-10-CM | POA: Diagnosis not present

## 2017-06-04 DIAGNOSIS — R0789 Other chest pain: Principal | ICD-10-CM | POA: Insufficient documentation

## 2017-06-04 DIAGNOSIS — Z7982 Long term (current) use of aspirin: Secondary | ICD-10-CM | POA: Insufficient documentation

## 2017-06-04 HISTORY — DX: Chronic fatigue, unspecified: R53.82

## 2017-06-04 HISTORY — DX: Personal history of other diseases of the musculoskeletal system and connective tissue: Z87.39

## 2017-06-04 HISTORY — DX: Myalgic encephalomyelitis/chronic fatigue syndrome: G93.32

## 2017-06-04 HISTORY — DX: Gastro-esophageal reflux disease without esophagitis: K21.9

## 2017-06-04 HISTORY — DX: Other seasonal allergic rhinitis: J30.2

## 2017-06-04 HISTORY — DX: Rheumatoid arthritis, unspecified: M06.9

## 2017-06-04 HISTORY — DX: Headache: R51

## 2017-06-04 HISTORY — DX: Headache, unspecified: R51.9

## 2017-06-04 HISTORY — DX: Sciatica, left side: M54.32

## 2017-06-04 HISTORY — DX: Personal history of urinary calculi: Z87.442

## 2017-06-04 HISTORY — DX: Angina pectoris, unspecified: I20.9

## 2017-06-04 HISTORY — DX: Chronic obstructive pulmonary disease, unspecified: J44.9

## 2017-06-04 LAB — TROPONIN I: Troponin I: <0.03 ng/mL (ref <0.03)

## 2017-06-04 LAB — BASIC METABOLIC PANEL
Anion gap: 7 (ref 5–15)
BUN: 17 mg/dL (ref 6–20)
CALCIUM: 9.1 mg/dL (ref 8.9–10.3)
CHLORIDE: 107 mmol/L (ref 101–111)
CO2: 23 mmol/L (ref 22–32)
Creatinine, Ser: 0.7 mg/dL (ref 0.44–1.00)
GFR calc non Af Amer: >60 mL/min (ref >60)
Glucose, Bld: 107 mg/dL — ABNORMAL HIGH (ref 65–99)
Potassium: 4 mmol/L (ref 3.5–5.1)
SODIUM: 137 mmol/L (ref 135–145)

## 2017-06-04 LAB — CBC
HCT: 36.5 % (ref 36.0–46.0)
Hemoglobin: 12.1 g/dL (ref 12.0–15.0)
MCH: 27.3 pg (ref 26.0–34.0)
MCHC: 33.2 g/dL (ref 30.0–36.0)
MCV: 82.4 fL (ref 78.0–100.0)
PLATELETS: 277 10*3/uL (ref 150–400)
RBC: 4.43 MIL/uL (ref 3.87–5.11)
RDW: 13.4 % (ref 11.5–15.5)
WBC: 6.8 10*3/uL (ref 4.0–10.5)

## 2017-06-04 LAB — SEDIMENTATION RATE: SED RATE: 36 mm/h — AB (ref 0–22)

## 2017-06-04 LAB — C-REACTIVE PROTEIN: CRP: 1.6 mg/dL — AB (ref <1.0)

## 2017-06-04 LAB — HCG, QUANTITATIVE, PREGNANCY: HCG, BETA CHAIN, QUANT, S: 8 m[IU]/mL — AB (ref <5)

## 2017-06-04 LAB — I-STAT TROPONIN, ED: TROPONIN I, POC: 0.02 ng/mL (ref 0.00–0.08)

## 2017-06-04 MED ORDER — MONTELUKAST SODIUM 10 MG PO TABS
10.0000 mg | ORAL_TABLET | Freq: Every day | ORAL | Status: DC
  Administered 2017-06-04: 10 mg via ORAL
  Filled 2017-06-04: qty 1

## 2017-06-04 MED ORDER — FLUTICASONE PROPIONATE 50 MCG/ACT NA SUSP: 1.0000 | Freq: Every day | NASAL | Status: DC

## 2017-06-04 MED ORDER — MOMETASONE FURO-FORMOTEROL FUM 200-5 MCG/ACT IN AERO
2.0000 | INHALATION_SPRAY | Freq: Two times a day (BID) | RESPIRATORY_TRACT | Status: DC
  Administered 2017-06-05: 2 via RESPIRATORY_TRACT
  Filled 2017-06-04: qty 8.8

## 2017-06-04 MED ORDER — PANTOPRAZOLE SODIUM 40 MG PO TBEC
40.0000 mg | DELAYED_RELEASE_TABLET | Freq: Every day | ORAL | Status: DC
  Administered 2017-06-04: 40 mg via ORAL
  Filled 2017-06-04: qty 1

## 2017-06-04 MED ORDER — ACETAMINOPHEN 325 MG PO TABS
650.0000 mg | ORAL_TABLET | ORAL | Status: DC | PRN
  Administered 2017-06-05: 650 mg via ORAL
  Filled 2017-06-04: qty 2

## 2017-06-04 MED ORDER — MORPHINE SULFATE (PF) 4 MG/ML IV SOLN: 2.0000 mg | INTRAVENOUS | Status: DC | PRN

## 2017-06-04 MED ORDER — LEFLUNOMIDE 20 MG PO TABS
20.0000 mg | ORAL_TABLET | Freq: Every day | ORAL | Status: DC
  Administered 2017-06-05: 20 mg via ORAL
  Filled 2017-06-04: qty 1

## 2017-06-04 MED ORDER — ASPIRIN 325 MG PO TABS
325.0000 mg | ORAL_TABLET | Freq: Every day | ORAL | Status: DC
  Administered 2017-06-05: 325 mg via ORAL
  Filled 2017-06-04: qty 1

## 2017-06-04 MED ORDER — DULOXETINE HCL 60 MG PO CPEP
90.0000 mg | ORAL_CAPSULE | Freq: Every day | ORAL | Status: DC
  Administered 2017-06-05: 90 mg via ORAL
  Filled 2017-06-04: qty 1

## 2017-06-04 MED ORDER — PREGABALIN 50 MG PO CAPS
200.0000 mg | ORAL_CAPSULE | Freq: Three times a day (TID) | ORAL | Status: DC
Start: 2017-06-04 — End: 2017-06-05
  Administered 2017-06-04 – 2017-06-05 (×2): 200 mg via ORAL
  Filled 2017-06-04 (×2): qty 4

## 2017-06-04 MED ORDER — ZOLPIDEM TARTRATE 5 MG PO TABS: 5.0000 mg | ORAL_TABLET | Freq: Every evening | ORAL | Status: DC | PRN

## 2017-06-04 MED ORDER — NA SULFATE-K SULFATE-MG SULF 17.5-3.13-1.6 GM/177ML PO SOLN: 1.0000 | ORAL | Status: DC

## 2017-06-04 MED ORDER — NABUMETONE 500 MG PO TABS
500.0000 mg | ORAL_TABLET | Freq: Three times a day (TID) | ORAL | Status: DC
  Administered 2017-06-04 – 2017-06-05 (×2): 500 mg via ORAL
  Filled 2017-06-04 (×4): qty 1

## 2017-06-04 MED ORDER — CETIRIZINE HCL 10 MG PO TABS
10.0000 mg | ORAL_TABLET | Freq: Every day | ORAL | Status: DC
  Administered 2017-06-04: 10 mg via ORAL
  Filled 2017-06-04 (×2): qty 1

## 2017-06-04 MED ORDER — COLCHICINE 0.6 MG PO TABS: 0.6000 mg | ORAL_TABLET | Freq: Once | ORAL | Status: DC

## 2017-06-04 MED ORDER — CLONAZEPAM 0.5 MG PO TABS: 0.2500 mg | ORAL_TABLET | Freq: Two times a day (BID) | ORAL | Status: DC | PRN

## 2017-06-04 MED ORDER — DULOXETINE HCL 60 MG PO CPEP: 60.0000 mg | ORAL_CAPSULE | Freq: Every day | ORAL | Status: DC

## 2017-06-04 MED ORDER — ALBUTEROL SULFATE (2.5 MG/3ML) 0.083% IN NEBU: 2.5000 mg | INHALATION_SOLUTION | RESPIRATORY_TRACT | Status: DC | PRN

## 2017-06-04 MED ORDER — ASPIRIN 81 MG PO CHEW: 324.0000 mg | CHEWABLE_TABLET | Freq: Once | ORAL | Status: DC

## 2017-06-04 MED ORDER — ATOMOXETINE HCL 60 MG PO CAPS
100.0000 mg | ORAL_CAPSULE | Freq: Every day | ORAL | Status: DC
  Administered 2017-06-05: 100 mg via ORAL
  Filled 2017-06-04: qty 1

## 2017-06-04 MED ORDER — ENOXAPARIN SODIUM 40 MG/0.4ML ~~LOC~~ SOLN
40.0000 mg | SUBCUTANEOUS | Status: DC
  Administered 2017-06-04: 40 mg via SUBCUTANEOUS
  Filled 2017-06-04: qty 0.4

## 2017-06-04 MED ORDER — SODIUM CHLORIDE 0.9 % IV BOLUS (SEPSIS)
1000.0000 mL | Freq: Once | INTRAVENOUS | Status: AC
  Administered 2017-06-04: 1000 mL via INTRAVENOUS

## 2017-06-04 MED ORDER — CETIRIZINE HCL 10 MG PO TABS
5.0000 mg | ORAL_TABLET | Freq: Every day | ORAL | Status: DC
  Filled 2017-06-04: qty 1

## 2017-06-04 MED ORDER — SODIUM CHLORIDE 0.9 % IV SOLN
INTRAVENOUS | Status: DC
  Administered 2017-06-04: 20:00:00 via INTRAVENOUS

## 2017-06-04 MED ORDER — ONDANSETRON HCL 4 MG/2ML IJ SOLN: 4.0000 mg | Freq: Four times a day (QID) | INTRAMUSCULAR | Status: DC | PRN

## 2017-06-04 MED ORDER — DICLOFENAC SODIUM 1 % TD GEL
1.0000 "application " | Freq: Two times a day (BID) | TRANSDERMAL | Status: DC | PRN
  Filled 2017-06-04: qty 100

## 2017-06-04 MED ORDER — NITROGLYCERIN 0.4 MG SL SUBL: 0.4000 mg | SUBLINGUAL_TABLET | SUBLINGUAL | Status: DC | PRN

## 2017-06-04 NOTE — ED Provider Notes (Signed)
[facility] DEPT Provider Note   CSN: [id number] Arrival date & time: 06/04/17  1443     History   Chief Complaint Chief Complaint  Patient presents with  . Chest Pain    HPI Dawn Jordan is a 51 y.o. female.  HPI   Pt with hx RA, fibromyalgia, asthma, RA, early family hx CAD p/w left sided chest pain with radiation to the back and down her left arm, with associated SOB that began this morning around 11am and has been waxing and waning since.  Pain is described as pressure and squeezing.  The pain began about 8 minutes after exercising in the pool.  Denies lightheadedness, dizziness, cough, abdominal pain, leg swelling.  No recent immobilization, no hx clots, no exogenous estrogen.  Brother had MI at 4, mother has severe CAD.    Past Medical History:  Diagnosis Date  . Allergy   . Anemia   . Anxiety   . Arthritis   . Asthma   . Depression   . Fibromyalgia   . OSA (obstructive sleep apnea)   . Rheumatism     Patient Active Problem List   Diagnosis Date Noted  . Asthma 06/04/2017  . Chest pain 06/04/2017  . Special screening for malignant neoplasms, colon 04/06/2017  . Gastroesophageal reflux disease 04/06/2017  . Abdominal pain, epigastric 04/06/2017  . Chronic diarrhea 04/06/2017  . OSA (obstructive sleep apnea) 03/30/2017  . Major depressive disorder, single episode, moderate ([place]) 03/09/2017  . Moderate persistent asthma 01/28/2017  . History of food allergy 01/28/2017  . Dermatitis, contact 01/28/2017  . Allergic rhinitis 12/22/2016  . Fibromyalgia syndrome 09/21/2016  . Attention deficit hyperactivity disorder (ADHD) 09/21/2016  . PTSD (post-traumatic stress disorder) 09/21/2016  . BMI 37.0-37.9, adult   . Chronic rheumatic arthritis ([place]) 12/07/2014    Past Surgical History:  Procedure Laterality Date  . CHOLECYSTECTOMY    . CYST EXCISION     to hand  . KNEE ARTHROSCOPY     Both knees  . TONSILLECTOMY      OB History    No data available       Home Medications    Prior to Admission medications   Medication Sig Start Date End Date Taking? Authorizing Provider  albuterol (PROVENTIL HFA;VENTOLIN HFA) 108 (90 Base) MCG/ACT inhaler Inhale 2 puffs into the lungs every 6 (six) hours as needed for wheezing or shortness of breath. 12/22/16  Yes [place], Betty G, MD  aspirin 81 MG chewable tablet Chew 324 mg by mouth once.   Yes [provider]  atomoxetine (STRATTERA) 100 MG capsule Take 1 capsule (100 mg total) by mouth daily. 05/26/17  Yes Hisada, Elie Goody, MD  budesonide-formoterol (SYMBICORT) 160-4.5 MCG/ACT inhaler Inhale 2 puffs into the lungs at bedtime.   Yes [provider]  clonazePAM (KLONOPIN) 0.5 MG tablet Take 0.25 mg by mouth 2 (two) times daily as needed for anxiety.   Yes [provider]  diclofenac sodium (VOLTAREN) 1 % GEL Apply 1 application topically 2 (two) times daily as needed (back and knee pain).    Yes [provider]  DULoxetine (CYMBALTA) 60 MG capsule Take 60 mg by mouth daily with lunch.  05/04/17  Yes [provider]  etanercept (ENBREL) 50 MG/ML injection Inject 50 mg into the skin every Friday.   Yes [provider]  flunisolide (NASALIDE) 25 MCG/ACT (0.025%) SOLN Place 1 spray into the nose 2 (two) times daily. Patient taking differently: Place 1 spray into the nose 2 (  two) times daily as needed (congestion).  01/28/17  Yes Bobbitt, Sedalia Muta, MD  leflunomide (ARAVA) 20 MG tablet Take 20 mg by mouth daily. 11/19/14  Yes [provider]  levocetirizine (XYZAL) 5 MG tablet Take 1 tablet (5 mg total) by mouth every evening. Patient taking differently: Take 5 mg by mouth at bedtime.  01/28/17  Yes Bobbitt, Sedalia Muta, MD  montelukast (SINGULAIR) 10 MG tablet Take 1 tablet (10 mg total) by mouth at bedtime. 03/04/17  Yes [place], Betty G, MD  nabumetone (RELAFEN) 500 MG tablet Take 500 mg by mouth 3 (three) times daily.  11/07/14  Yes [provider]  nitroGLYCERIN (NITROSTAT) 0.4 MG SL tablet Place 1 tablet (0.4 mg total) under the tongue every 5 (five) minutes as needed for chest pain. 12/07/14  Yes Debbe Odea, MD  omeprazole (PRILOSEC) 40 MG capsule Take 1 capsule (40 mg total) by mouth daily before breakfast. 03/30/17  Yes [place], Betty G, MD  pregabalin (LYRICA) 200 MG capsule Take 200 mg by mouth 3 (three) times daily.   Yes [provider]  aspirin EC 81 MG tablet Take 1 tablet (81 mg total) by mouth daily. Patient not taking: Reported on 06/04/2017 12/07/14   Debbe Odea, MD  budesonide-formoterol [facility]) 80-4.5 MCG/ACT inhaler Inhale 2 puffs into the lungs 2 (two) times daily. Patient not taking: Reported on 06/04/2017 12/22/16   [place], Betty G, MD  DULoxetine (CYMBALTA) 30 MG capsule Take 3 capsules (90 mg total) by mouth daily. 05/26/17   Norman Clay, MD  levalbuterol (XOPENEX) 1.25 MG/3ML nebulizer solution Take 1.25 mg by nebulization every 4 (four) hours as needed for wheezing. Patient not taking: Reported on 06/04/2017 01/28/17   Bobbitt, Sedalia Muta, MD  Na Sulfate-K Sulfate-Mg Sulf 17.5-3.13-1.6 GM/180ML SOLN Take 1 kit by mouth as directed. 04/06/17   Zehr, Laban Emperor, PA-C    Family History Family History  Problem Relation Age of Onset  . Arthritis Mother   . Hyperlipidemia Mother   . Hypertension Mother   . Stroke Mother   . Mental retardation Mother   . Diabetes Mother   . Allergic rhinitis Mother   . Diabetes Maternal Grandfather   . Hypertension Maternal Grandfather   . Diabetes Paternal Grandmother   . Hypertension Paternal Grandmother   . Cancer Paternal Grandmother        breast  . Allergic rhinitis Brother   . Colon cancer Paternal Aunt   . Lung cancer Other   . Lung cancer Maternal Uncle   . Asthma Neg Hx   . Eczema Neg Hx   . Immunodeficiency Neg Hx   . Urticaria Neg Hx   . Atopy Neg Hx   . Angioedema Neg Hx     Social History Social History  Substance Use Topics  .  Smoking status: Never Smoker  . Smokeless tobacco: Never Used  . Alcohol use No     Allergies   Eggs or egg-derived products; Latex; Other; and Codeine   Review of Systems Review of Systems  All other systems reviewed and are negative.    Physical Exam Updated Vital Signs BP 113/90   Pulse 80   Temp 98.7 F (37.1 C) (Oral)   Resp 16   Ht 5' 4"  (1.626 m)   Wt 106.6 kg (235 lb)   LMP  (LMP Unknown)   SpO2 100%   BMI 40.34 kg/m   Physical Exam  Constitutional: She appears well-developed and well-nourished. No distress.  HENT:  Head: Normocephalic and atraumatic.  Neck: Neck supple.  Cardiovascular: Normal rate and regular rhythm.   Pulmonary/Chest: Effort normal and breath sounds normal. No respiratory distress. She has no wheezes. She has no rales. She exhibits tenderness (left chest).  Abdominal: Soft. She exhibits no distension. There is no tenderness. There is no rebound and no guarding.  Neurological: She is alert.  Skin: She is not diaphoretic.  Nursing note and vitals reviewed.    ED Treatments / Results  Labs (all labs ordered are listed, but only abnormal results are displayed) Labs Reviewed  BASIC METABOLIC PANEL - Abnormal; Notable for the following:       Result Value   Glucose, Bld 107 (*)    All other components within normal limits  CBC  I-STAT TROPOININ, ED    EKG  EKG Interpretation  Date/Time:  Friday June 04 2017 15:02:46 EDT Ventricular Rate:  74 PR Interval:  134 QRS Duration: 82 QT Interval:  380 QTC Calculation: 421 R Axis:   2 Text Interpretation:  Normal sinus rhythm Nonspecific T wave abnormality Abnormal ECG biphasic t weaves in v4-6 Otherwise no significant change Confirmed by Deno Etienne [phone]) on 06/04/2017 4:57:18 PM       Radiology Dg Chest 2 View  Result Date: 06/04/2017 CLINICAL DATA:  Centralized chest pain radiating to left arm the past day. EXAM: CHEST  2 VIEW COMPARISON:  02/17/2016; 12/07/2014 FINDINGS:  Grossly unchanged cardiac silhouette and mediastinal contours. Mild diffuse slightly nodular thickening of the pulmonary interstitium. No focal airspace opacities. No pleural effusion or pneumothorax. No evidence of edema. No acute osseus abnormalities. Post cholecystectomy. IMPRESSION: Bronchitic change without acute cardiopulmonary disease. Electronically Signed   By: Sandi Mariscal M.D.   On: 06/04/2017 16:03    Procedures Procedures (including critical care time)  Medications Ordered in ED Medications  nitroGLYCERIN (NITROSTAT) SL tablet 0.4 mg (not administered)  sodium chloride 0.9 % bolus 1,000 mL (0 mLs Intravenous Stopped 06/04/17 1900)     Initial Impression / Assessment and Plan / ED Course  I have reviewed the triage vital signs and the nursing notes.  Pertinent labs & imaging results that were available during my care of the patient were reviewed by me and considered in my medical decision making (see chart for details).  Clinical Course as of Jun 05 1923  Fri Jun 04, 2017  1216 Discussed pt with Dr Tyrone Nine.  Pt with new lateral Twave inversions since prior EKG.  HEART score is 5.  Plan for admission.    [EW]    Clinical Course User Index [EW] [place], Emmalou Hunger, PA-C    Pt with hx obesity, strong family hx CAD p/w left sided chest pain and SOB that has been waxing and waning since this morning 11am.  Lateral T wave changes on EKG.  Admitted to [facility] for chest pain/serial troponin/acs rule out, Dr Blaine Hamper accepting.    Final Clinical Impressions(s) / ED Diagnoses   Final diagnoses:  Chest pain, unspecified type    New Prescriptions New Prescriptions   No medications on file     Clayton Bibles, Hershal Coria 06/04/17 [place], [place], DO 06/04/17 1936

## 2017-06-04 NOTE — ED Triage Notes (Signed)
Pt st's after exercising in pool today she started to have left chest pain radiating into left shoulder and left upper back.  Also st's she felt short of breath with slight nausea.  Pt st's after going home and laid down the paih subsided but when she got up it returned.  Pt took 4 baby ASA before coming to ED

## 2017-06-04 NOTE — H&P (Signed)
History and Physical    Dawn Jordan ZWC:[id number] DOB: Sep 11, 1966 DOA: 06/04/2017  Referring MD/NP/PA:   PCP: [place], Betty G, MD   Patient coming from:  The patient is coming from home.  At baseline, pt is independent for most of ADL.   Chief Complaint: chest pain  HPI: Dawn Jordan is a 51 y.o. female with medical history significant of rheumatoid arthritis on Enbrel, aspirin, GERD, depression, anxiety, OSA not on CPAP, fibromyalgia, GERD, who presents with chest pain.  Pt states that her chest pain started after exercising in the pool. It is located in the left side of chest, intermittent, 10 out of 10 in severity, lightheadedness, radiating to the left shoulder and left upper back. It is pleuritic, getting worse with deep breath. Patient denies cough, fever or chills. She has some SOB which has subsided. Denies recent traveling, no calf tenderness. Patient had nausea and sweating. Denies vomiting, diarrhea, abdominal pain, symptoms of UTI or unilateral weakness.  ED Course: pt was found to have negative troponin, WBC 6.8, electrolytes renal function okay, temperature normal, no tachycardia, oxygen saturation 99% on room air, chest x-ray showed chronic bronchitis take change without infiltration. Patient is placed on telemetry bed for observation.  Review of Systems:   General: no fevers, chills, no changes in body weight, has fatigue HEENT: no blurry vision, hearing changes or sore throat Respiratory: has dyspnea, no coughing, wheezing CV: has chest pain, no palpitations GI: has nausea, no vomiting, abdominal pain, diarrhea, constipation GU: no dysuria, burning on urination, increased urinary frequency, hematuria  Ext: no leg edema Neuro: no unilateral weakness, numbness, or tingling, no vision change or hearing loss Skin: no rash, no skin tear. MSK: No muscle spasm, no deformity, no limitation of range of movement in spin Heme: No easy bruising.  Travel history: No recent long  distant travel.  Allergy:  Allergies  Allergen Reactions  . Eggs Or Egg-Derived Products Diarrhea  . Latex Anaphylaxis, Hives and Itching    "Anaphylaxis is only when I am in a closed area (ex: car with balloons)"  . Other Other (See Comments)    Sinus headache from new plastics, carpets, etc.  . Codeine Itching    Past Medical History:  Diagnosis Date  . Allergy   . Anemia   . Anxiety   . Arthritis   . Asthma   . Depression   . Fibromyalgia   . OSA (obstructive sleep apnea)   . Rheumatism     Past Surgical History:  Procedure Laterality Date  . CHOLECYSTECTOMY    . CYST EXCISION     to hand  . KNEE ARTHROSCOPY     Both knees  . TONSILLECTOMY      Social History:  reports that she has never smoked. She has never used smokeless tobacco. She reports that she does not drink alcohol or use drugs.  Family History:  Family History  Problem Relation Age of Onset  . Arthritis Mother   . Hyperlipidemia Mother   . Hypertension Mother   . Stroke Mother   . Mental retardation Mother   . Diabetes Mother   . Allergic rhinitis Mother   . Diabetes Maternal Grandfather   . Hypertension Maternal Grandfather   . Diabetes Paternal Grandmother   . Hypertension Paternal Grandmother   . Cancer Paternal Grandmother        breast  . Allergic rhinitis Brother   . Colon cancer Paternal Aunt   . Lung cancer Other   . Lung  cancer Maternal Uncle   . Asthma Neg Hx   . Eczema Neg Hx   . Immunodeficiency Neg Hx   . Urticaria Neg Hx   . Atopy Neg Hx   . Angioedema Neg Hx      Prior to Admission medications   Medication Sig Start Date End Date Taking? Authorizing Provider  albuterol (PROVENTIL HFA;VENTOLIN HFA) 108 (90 Base) MCG/ACT inhaler Inhale 2 puffs into the lungs every 6 (six) hours as needed for wheezing or shortness of breath. 12/22/16  Yes [place], Betty G, MD  aspirin 81 MG chewable tablet Chew 324 mg by mouth once.   Yes [provider]  atomoxetine (STRATTERA)  100 MG capsule Take 1 capsule (100 mg total) by mouth daily. 05/26/17  Yes Hisada, Elie Goody, MD  budesonide-formoterol (SYMBICORT) 160-4.5 MCG/ACT inhaler Inhale 2 puffs into the lungs at bedtime.   Yes [provider]  clonazePAM (KLONOPIN) 0.5 MG tablet Take 0.25 mg by mouth 2 (two) times daily as needed for anxiety.   Yes [provider]  diclofenac sodium (VOLTAREN) 1 % GEL Apply 1 application topically 2 (two) times daily as needed (back and knee pain).    Yes [provider]  DULoxetine (CYMBALTA) 60 MG capsule Take 60 mg by mouth daily with lunch.  05/04/17  Yes [provider]  etanercept (ENBREL) 50 MG/ML injection Inject 50 mg into the skin every Friday.   Yes [provider]  flunisolide (NASALIDE) 25 MCG/ACT (0.025%) SOLN Place 1 spray into the nose 2 (two) times daily. Patient taking differently: Place 1 spray into the nose 2 (two) times daily as needed (congestion).  01/28/17  Yes Bobbitt, Sedalia Muta, MD  leflunomide (ARAVA) 20 MG tablet Take 20 mg by mouth daily. 11/19/14  Yes [provider]  levocetirizine (XYZAL) 5 MG tablet Take 1 tablet (5 mg total) by mouth every evening. Patient taking differently: Take 5 mg by mouth at bedtime.  01/28/17  Yes Bobbitt, Sedalia Muta, MD  montelukast (SINGULAIR) 10 MG tablet Take 1 tablet (10 mg total) by mouth at bedtime. 03/04/17  Yes [place], Betty G, MD  nabumetone (RELAFEN) 500 MG tablet Take 500 mg by mouth 3 (three) times daily.  11/07/14  Yes [provider]  nitroGLYCERIN (NITROSTAT) 0.4 MG SL tablet Place 1 tablet (0.4 mg total) under the tongue every 5 (five) minutes as needed for chest pain. 12/07/14  Yes Debbe Odea, MD  omeprazole (PRILOSEC) 40 MG capsule Take 1 capsule (40 mg total) by mouth daily before breakfast. 03/30/17  Yes [place], Betty G, MD  pregabalin (LYRICA) 200 MG capsule Take 200 mg by mouth 3 (three) times daily.   Yes [provider]  aspirin EC 81 MG  tablet Take 1 tablet (81 mg total) by mouth daily. Patient not taking: Reported on 06/04/2017 12/07/14   Debbe Odea, MD  budesonide-formoterol [facility]) 80-4.5 MCG/ACT inhaler Inhale 2 puffs into the lungs 2 (two) times daily. Patient not taking: Reported on 06/04/2017 12/22/16   [place], Betty G, MD  DULoxetine (CYMBALTA) 30 MG capsule Take 3 capsules (90 mg total) by mouth daily. 05/26/17   Norman Clay, MD  levalbuterol (XOPENEX) 1.25 MG/3ML nebulizer solution Take 1.25 mg by nebulization every 4 (four) hours as needed for wheezing. Patient not taking: Reported on 06/04/2017 01/28/17   Bobbitt, Sedalia Muta, MD  Na Sulfate-K Sulfate-Mg Sulf 17.5-3.13-1.6 GM/180ML SOLN Take 1 kit by mouth as directed. 04/06/17   Zehr, Laban Emperor, PA-C    Physical Exam:  Vitals:   06/04/17 1635 06/04/17 1716 06/04/17 1830 06/04/17 1902  BP: 133/65 135/62 104/69 113/90  Pulse: 65 85 74 80  Resp: _0 Temp: 98.7 F (37.1 C)     TempSrc: Oral     SpO2: 99% 99% 99% 100%  Weight:      Height:       General: Not in acute distress HEENT:       Eyes: PERRL, EOMI, no scleral icterus.       ENT: No discharge from the ears and nose, no pharynx injection, no tonsillar enlargement.        Neck: No JVD, no bruit, no mass felt. Heme: No neck lymph node enlargement. Cardiac: S1/S2, RRR, No murmurs, No gallops or rubs. Respiratory:  No rales, wheezing, rhonchi or rubs. GI: Soft, nondistended, nontender, no rebound pain, no organomegaly, BS present. GU: No hematuria Ext: No pitting leg edema bilaterally. 2+DP/PT pulse bilaterally. Musculoskeletal: No joint deformities, No joint redness or warmth, no limitation of ROM in spin. Skin: No rashes.  Neuro: Alert, oriented X3, cranial nerves II-XII grossly intact, moves all extremities normally. Psych: Patient is not psychotic, no suicidal or hemocidal ideation.  Labs on Admission: I have personally reviewed following labs and imaging studies  CBC:  Recent  Labs Lab 06/04/17 1536  WBC 6.8  HGB 12.1  HCT 36.5  MCV 82.4  PLT 709   Basic Metabolic Panel:  Recent Labs Lab 06/04/17 1536  NA 137  K 4.0  CL 107  CO2 23  GLUCOSE 107*  BUN 17  CREATININE 0.70  CALCIUM 9.1   GFR: Estimated Creatinine Clearance: 99.2 mL/min (by C-G formula based on SCr of 0.7 mg/dL). Liver Function Tests: No results for input(s): AST, ALT, ALKPHOS, BILITOT, PROT, ALBUMIN in the last 168 hours. No results for input(s): LIPASE, AMYLASE in the last 168 hours. No results for input(s): AMMONIA in the last 168 hours. Coagulation Profile: No results for input(s): INR, PROTIME in the last 168 hours. Cardiac Enzymes: No results for input(s): CKTOTAL, CKMB, CKMBINDEX, TROPONINI in the last 168 hours. BNP (last 3 results) No results for input(s): PROBNP in the last 8760 hours. HbA1C: No results for input(s): HGBA1C in the last 72 hours. CBG: No results for input(s): GLUCAP in the last 168 hours. Lipid Profile: No results for input(s): CHOL, HDL, LDLCALC, TRIG, CHOLHDL, LDLDIRECT in the last 72 hours. Thyroid Function Tests: No results for input(s): TSH, T4TOTAL, FREET4, T3FREE, THYROIDAB in the last 72 hours. Anemia Panel: No results for input(s): VITAMINB12, FOLATE, FERRITIN, TIBC, IRON, RETICCTPCT in the last 72 hours. Urine analysis: No results found for: COLORURINE, APPEARANCEUR, LABSPEC, PHURINE, GLUCOSEU, HGBUR, BILIRUBINUR, KETONESUR, PROTEINUR, UROBILINOGEN, NITRITE, LEUKOCYTESUR Sepsis Labs: _1 (procalcitonin:4,lacticidven:4) )No results found for this or any previous visit (from the past 240 hour(s)).   Radiological Exams on Admission: Dg Chest 2 View  Result Date: 06/04/2017 CLINICAL DATA:  Centralized chest pain radiating to left arm the past day. EXAM: CHEST  2 VIEW COMPARISON:  02/17/2016; 12/07/2014 FINDINGS: Grossly unchanged cardiac silhouette and mediastinal contours. Mild diffuse slightly nodular thickening of the pulmonary  interstitium. No focal airspace opacities. No pleural effusion or pneumothorax. No evidence of edema. No acute osseus abnormalities. Post cholecystectomy. IMPRESSION: Bronchitic change without acute cardiopulmonary disease. Electronically Signed   By: Sandi Mariscal M.D.   On: 06/04/2017 16:03     EKG: Independently reviewed.  Sinus rhythm, QTC 421, low voltage, T-wave inversion in lead 3/aVF and V4-V6.  Assessment/Plan Principal Problem:   Chest pain Active Problems:   Chronic rheumatic arthritis (HCC)   Attention deficit hyperactivity disorder (ADHD)   Major depressive disorder, single episode, moderate (HCC)   Gastroesophageal reflux disease   Asthma   Chest pain: Patient has pleuritic chest pain. Patient is active, no sighs of DVT, no oxygen desaturation, low risk for PE. Patient also has low risk for ACS. Potential differential diagnosis is pleurisy versus pericarditis given history of rheumatoid arthritis.  - will place on Tele bed for obs - cycle CE q6 x3 and repeat EKG in the am  - prn Nitroglycerin, Morphine, and aspirin - Risk factor stratification: will check FLP, UDS and A1C  - 2d echo  Chronic rheumatic arthritis ([place]): stable -Hold Enbrel -contine leflunomide and nabumetone - voltaren get prn  Attention deficit hyperactivity disorder (ADHD), anxiety and depression: Stable, no suicidal or homicidal ideations. -Continue home medications: Atomoxetine, when necessary Klonopin, Cymbalta,  GERD: -Protonix  Asthma: stable. No wheezing on auscultation -Dulera inhaler -When necessary albuterol nebulizer -Singulair   DVT ppx: SQ Lovenox Code Status: Full code Family Communication:  Yes, patient's lady friend at bed side Disposition Plan:  Anticipate discharge back to previous home environment Consults called:  none Admission status: Obs / tele         Date of Service 06/04/2017    Ivor Costa [facility] Pager [phone]  If 7PM-7AM, please contact  night-coverage [email or web address] Password [facility] 06/04/2017, 7:51 PM

## 2017-06-05 ENCOUNTER — Observation Stay (HOSPITAL_BASED_OUTPATIENT_CLINIC_OR_DEPARTMENT_OTHER): Payer: Medicare Other

## 2017-06-05 DIAGNOSIS — R072 Precordial pain: Secondary | ICD-10-CM | POA: Diagnosis not present

## 2017-06-05 DIAGNOSIS — R079 Chest pain, unspecified: Secondary | ICD-10-CM | POA: Diagnosis not present

## 2017-06-05 DIAGNOSIS — F909 Attention-deficit hyperactivity disorder, unspecified type: Secondary | ICD-10-CM

## 2017-06-05 DIAGNOSIS — R0789 Other chest pain: Secondary | ICD-10-CM | POA: Diagnosis not present

## 2017-06-05 DIAGNOSIS — K219 Gastro-esophageal reflux disease without esophagitis: Secondary | ICD-10-CM | POA: Diagnosis not present

## 2017-06-05 DIAGNOSIS — Z91040 Latex allergy status: Secondary | ICD-10-CM | POA: Diagnosis not present

## 2017-06-05 DIAGNOSIS — Z7982 Long term (current) use of aspirin: Secondary | ICD-10-CM | POA: Diagnosis not present

## 2017-06-05 DIAGNOSIS — J45909 Unspecified asthma, uncomplicated: Secondary | ICD-10-CM | POA: Diagnosis not present

## 2017-06-05 DIAGNOSIS — Z79899 Other long term (current) drug therapy: Secondary | ICD-10-CM | POA: Diagnosis not present

## 2017-06-05 DIAGNOSIS — M069 Rheumatoid arthritis, unspecified: Secondary | ICD-10-CM | POA: Diagnosis not present

## 2017-06-05 LAB — RAPID URINE DRUG SCREEN, HOSP PERFORMED
Amphetamines: NOT DETECTED
Barbiturates: NOT DETECTED
Benzodiazepines: NOT DETECTED
Cocaine: NOT DETECTED
Opiates: NOT DETECTED
TETRAHYDROCANNABINOL: NOT DETECTED

## 2017-06-05 LAB — LIPID PANEL
CHOL/HDL RATIO: 5.2 ratio
Cholesterol: 173 mg/dL (ref 0–200)
HDL: 33 mg/dL — ABNORMAL LOW (ref >40)
LDL CALC: 86 mg/dL (ref 0–99)
Triglycerides: 272 mg/dL — ABNORMAL HIGH (ref <150)
VLDL: 54 mg/dL — AB (ref 0–40)

## 2017-06-05 LAB — D-DIMER, QUANTITATIVE: D-Dimer, Quant: 0.32 ug/mL-FEU (ref 0.00–0.50)

## 2017-06-05 LAB — TROPONIN I

## 2017-06-05 LAB — ECHOCARDIOGRAM COMPLETE
Height: 64 in
Weight: 3713.6 oz

## 2017-06-05 LAB — HIV ANTIBODY (ROUTINE TESTING W REFLEX): HIV Screen 4th Generation wRfx: NONREACTIVE

## 2017-06-05 MED ORDER — IBUPROFEN 800 MG PO TABS: 800.0000 mg | ORAL_TABLET | Freq: Three times a day (TID) | ORAL | 0 refills | Status: AC

## 2017-06-05 MED ORDER — ONDANSETRON 4 MG PO TBDP
4.0000 mg | ORAL_TABLET | Freq: Once | ORAL | Status: AC
  Administered 2017-06-05: 4 mg via ORAL
  Filled 2017-06-05: qty 1

## 2017-06-05 NOTE — Discharge Summary (Addendum)
Physician Discharge Summary  Dawn Jordan JQB:[id number] DOB: 03-Apr-1966 DOA: 06/04/2017  PCP: [place], Betty G, MD  Admit date: 06/04/2017 Discharge date: 06/05/2017  Admitted From: Home  Disposition:  Home  Recommendations for Outpatient Follow-up:  1. Follow up with PCP in 1-2 weeks.   2. Trial of ibuprofen 800 mg 3 times a day 3. Consider referral to cardiology if chest pain persists.  Consider repeating her EKG as an outpatient if there continues to be suspicion for pericarditis to see if she has developed ST segment elevations.  Home Health:  Home  Equipment/Devices:  Home   Discharge Condition:  Stable, improved CODE STATUS:  Full code  Diet recommendation:  Healthy heart   Brief/Interim Summary:  The patient is a 51 year old female with history of rheumatoid arthritis, aspirin, GERD, depression and anxiety, OSA, fibromyalgia and GERD who presented with chest pain. Her pain started after she had been exercising the pool while she was changing into her regular close. It occurred suddenly on the left side of her chest 10 out of 10 in severity and was associated with lightheadedness, radiation to the left shoulder and left upper back and was pleuritic. She denied cough, fevers or chills.  In the emergency department, her troponin was negative. Her EKG demonstrated no acute ischemic changes. Her chest x-ray showed chronic bronchitis without acute infiltrate. She was observed overnight. Telemetry demonstrated sinus rhythm with occasional PVCs. Her d-dimer was negative. Her subsequent troponins were negative. Echocardiogram was performed which demonstrated preserved ejection fraction, no regional wall motion abnormalities and normal left ventricular diastolic function. She had trivial aortic, tricuspid, and mitral valve regurgitation.  On exam, although she had some pain with palpation of the left chest and left shoulder, she denied the pain experienced with palpation was similar to pain that she  had had at the gym. Because her pain is pleuritic, I suspect that it may be musculoskeletal. Alternatively this could be a sign of pleurisy or pericarditis however her EKG did not show any ST segment elevations to suggest pericarditis. She also did not have any pericardial effusion on echocardiogram. Have given her prescription for high-dose ibuprofen to take for 1 week and advised her to follow-up with her primary care doctor for further evaluation.  Discharge Diagnoses:  Principal Problem:   Musculoskeletal chest pain Active Problems:   Chronic rheumatic arthritis (HCC)   Attention deficit hyperactivity disorder (ADHD)   Major depressive disorder, single episode, moderate (HCC)   Gastroesophageal reflux disease   Asthma  Atypical chest pain, likely musculoskeletal in nature.  She had no sign of DVT in her d-dimer was negative. Troponins were negative and her EKG demonstrated no acute ischemic changes. She had no regional wall motion abnormalities on echocardiogram.  Her urine drug screen was negative. Her lipid panel demonstrated an LDL of 86. Hemoglobin A1c is pending at the time of discharge.  Because her pain was brought on by sitting up in bed, movement of her head and neck or deep breaths suggest that this was musculoskeletal vs. Early pericarditis that is not evident on ECG.  She was given a prescription for ibuprofen 800 mg by mouth 3 times a day 7 days. She was already on an NSAID at home which have advised her to stop for the next week. She may resume her Relafen at the end of her ibuprofen course.    Her chronic rheumatoid arthritis, ADHD, anxiety depression, GERD, and asthma remained stable. No other changes were made to her medications except as  mentioned above.   Discharge Instructions  Discharge Instructions    Call MD for:  difficulty breathing, headache or visual disturbances    Complete by:  As directed    Call MD for:  extreme fatigue    Complete by:  As directed    Call  MD for:  hives    Complete by:  As directed    Call MD for:  persistant dizziness or light-headedness    Complete by:  As directed    Call MD for:  persistant nausea and vomiting    Complete by:  As directed    Call MD for:  severe uncontrolled pain    Complete by:  As directed    Call MD for:  temperature >100.4    Complete by:  As directed    Diet - low sodium heart healthy    Complete by:  As directed    Discharge instructions    Complete by:  As directed    Hold your relafen while taking your ibuprofen.  You may resume this medication when your ibuprofen course is complete.   Increase activity slowly    Complete by:  As directed        Medication List    STOP taking these medications   nabumetone 500 MG tablet Commonly known as:  RELAFEN     TAKE these medications   albuterol 108 (90 Base) MCG/ACT inhaler Commonly known as:  PROVENTIL HFA;VENTOLIN HFA Inhale 2 puffs into the lungs every 6 (six) hours as needed for wheezing or shortness of breath.   aspirin EC 81 MG tablet Take 1 tablet (81 mg total) by mouth daily. What changed:  Another medication with the same name was removed. Continue taking this medication, and follow the directions you see here.   atomoxetine 100 MG capsule Commonly known as:  STRATTERA Take 1 capsule (100 mg total) by mouth daily.   budesonide-formoterol 160-4.5 MCG/ACT inhaler Commonly known as:  SYMBICORT Inhale 2 puffs into the lungs at bedtime. What changed:  Another medication with the same name was removed. Continue taking this medication, and follow the directions you see here.   clonazePAM 0.5 MG tablet Commonly known as:  KLONOPIN Take 0.25 mg by mouth 2 (two) times daily as needed for anxiety.   DULoxetine 60 MG capsule Commonly known as:  CYMBALTA Take 60 mg by mouth daily with lunch.   DULoxetine 30 MG capsule Commonly known as:  CYMBALTA Take 3 capsules (90 mg total) by mouth daily.   ENBREL 50 MG/ML injection Generic  drug:  etanercept Inject 50 mg into the skin every Friday.   flunisolide 25 MCG/ACT (0.025%) Soln Commonly known as:  NASALIDE Place 1 spray into the nose 2 (two) times daily. What changed:  when to take this  reasons to take this   ibuprofen 800 MG tablet Commonly known as:  ADVIL,MOTRIN Take 1 tablet (800 mg total) by mouth 3 (three) times daily.   leflunomide 20 MG tablet Commonly known as:  ARAVA Take 20 mg by mouth daily.   levalbuterol 1.25 MG/3ML nebulizer solution Commonly known as:  XOPENEX Take 1.25 mg by nebulization every 4 (four) hours as needed for wheezing.   levocetirizine 5 MG tablet Commonly known as:  XYZAL Take 1 tablet (5 mg total) by mouth every evening. What changed:  when to take this   montelukast 10 MG tablet Commonly known as:  SINGULAIR Take 1 tablet (10 mg total) by mouth at bedtime.  Na Sulfate-K Sulfate-Mg Sulf 17.5-3.13-1.6 GM/180ML Soln Take 1 kit by mouth as directed.   nitroGLYCERIN 0.4 MG SL tablet Commonly known as:  NITROSTAT Place 1 tablet (0.4 mg total) under the tongue every 5 (five) minutes as needed for chest pain.   omeprazole 40 MG capsule Commonly known as:  PRILOSEC Take 1 capsule (40 mg total) by mouth daily before breakfast.   pregabalin 200 MG capsule Commonly known as:  LYRICA Take 200 mg by mouth 3 (three) times daily.   VOLTAREN 1 % Gel Generic drug:  diclofenac sodium Apply 1 application topically 2 (two) times daily as needed (back and knee pain).       Allergies  Allergen Reactions  . Eggs Or Egg-Derived Products Diarrhea  . Latex Anaphylaxis, Hives and Itching    "Anaphylaxis is only when I am in a closed area (ex: car with balloons)"  . Other Other (See Comments)    Sinus headache from new plastics, carpets, etc.  . Codeine Itching    Consultations: none   Procedures/Studies: Dg Chest 2 View  Result Date: 06/04/2017 CLINICAL DATA:  Centralized chest pain radiating to left arm the past  day. EXAM: CHEST  2 VIEW COMPARISON:  02/17/2016; 12/07/2014 FINDINGS: Grossly unchanged cardiac silhouette and mediastinal contours. Mild diffuse slightly nodular thickening of the pulmonary interstitium. No focal airspace opacities. No pleural effusion or pneumothorax. No evidence of edema. No acute osseus abnormalities. Post cholecystectomy. IMPRESSION: Bronchitic change without acute cardiopulmonary disease. Electronically Signed   By: Sandi Mariscal M.D.   On: 06/04/2017 16:03    ECHO:   "Left ventricle: The cavity size was normal. Wall thickness was   increased in a pattern of mild LVH. The estimated ejection   fraction was 55%. Wall motion was normal; there were no regional   wall motion abnormalities. Left ventricular diastolic function   parameters were normal. - Aortic valve: There was mild regurgitation. - Mitral valve: Calcified annulus. There was mild regurgitation. - Right atrium: Central venous pressure (est): 3 mm Hg. - Atrial septum: No defect or patent foramen ovale was identified. - Tricuspid valve: There was trivial regurgitation. - Pulmonary arteries: PA peak pressure: 21 mm Hg (S). - Pericardium, extracardiac: There was no pericardial effusion."   Subjective:  Denise to have intermittent chest pains which occurred approximately 4-5 times overnight. The pains are associated with some mild shortness of breath and have not been as severe as they were when she was at the gym. She denies associated nausea although just prior to leaving the hospital she had an episode of nausea requiring a one-time dose of Zofran.  Movement of her cervical spine or sitting up in bed seem to trigger her pain.  Discharge Exam: Vitals:   06/05/17 0545 06/05/17 0900  BP: 114/78 120/84  Pulse:  68  Resp:    Temp: 98 F (36.7 C)    Vitals:   06/04/17 2100 06/04/17 2340 06/05/17 0545 06/05/17 0900  BP: (!) 123/92 116/71 114/78 120/84  Pulse:    68  Resp:      Temp: 97.9 F (36.6 C)  98 F  (36.7 C)   TempSrc: Oral  Oral   SpO2: 98% 100% 99% 97%  Weight: 104.4 kg (230 lb 3.2 oz)  105.3 kg (232 lb 1.6 oz)   Height: 5' 4"  (1.626 m)       General: Obese female, alert awake, no acute distress  Neck: No obvious posterior cervical chain adenopathy, masses Cardiovascular: RRR, S1/S2 +,  no rubs, no gallops Chest wall: Positive tenderness palpation along the left sternal border and also along the anterior left shoulder.  Was unable to elicit pain when she moved her left shoulder. Did feel some pains when she tried to sit up in bed. Respiratory: CTA bilaterally, no wheezing, no rhonchi Abdominal: Soft, NT, ND, bowel sounds + Extremities: no edema, no cyanosis    The results of significant diagnostics from this hospitalization (including imaging, microbiology, ancillary and laboratory) are listed below for reference.     Microbiology: No results found for this or any previous visit (from the past 240 hour(s)).   Labs: BNP (last 3 results) No results for input(s): BNP in the last 8760 hours. Basic Metabolic Panel:  Recent Labs Lab 06/04/17 1536  NA 137  K 4.0  CL 107  CO2 23  GLUCOSE 107*  BUN 17  CREATININE 0.70  CALCIUM 9.1   Liver Function Tests: No results for input(s): AST, ALT, ALKPHOS, BILITOT, PROT, ALBUMIN in the last 168 hours. No results for input(s): LIPASE, AMYLASE in the last 168 hours. No results for input(s): AMMONIA in the last 168 hours. CBC:  Recent Labs Lab 06/04/17 1536  WBC 6.8  HGB 12.1  HCT 36.5  MCV 82.4  PLT 277   Cardiac Enzymes:  Recent Labs Lab 06/04/17 2022 06/05/17 0129 06/05/17 0719  TROPONINI <0.03 <0.03 <0.03   BNP: Invalid input(s): POCBNP CBG: No results for input(s): GLUCAP in the last 168 hours. D-Dimer  Recent Labs  06/05/17 0719  DDIMER 0.32   Hgb A1c No results for input(s): HGBA1C in the last 72 hours. Lipid Profile  Recent Labs  06/05/17 0129  CHOL 173  HDL 33*  LDLCALC 86  TRIG 272*   CHOLHDL 5.2   Thyroid function studies No results for input(s): TSH, T4TOTAL, T3FREE, THYROIDAB in the last 72 hours.  Invalid input(s): FREET3 Anemia work up No results for input(s): VITAMINB12, FOLATE, FERRITIN, TIBC, IRON, RETICCTPCT in the last 72 hours. Urinalysis No results found for: COLORURINE, APPEARANCEUR, LABSPEC, [place], GLUCOSEU, HGBUR, BILIRUBINUR, KETONESUR, PROTEINUR, UROBILINOGEN, NITRITE, LEUKOCYTESUR Sepsis Labs Invalid input(s): PROCALCITONIN,  WBC,  LACTICIDVEN   Time coordinating discharge: Over 30 minutes  SIGNED:   Janece Canterbury, MD  [facility] 06/05/2017, 3:58 PM Pager   If 7PM-7AM, please contact night-coverage [email or web address] Password [email or web address]

## 2017-06-05 NOTE — Progress Notes (Signed)
  Echocardiogram 2D Echocardiogram has been performed.  Jennette Dubin 06/05/2017, 1:08 PM

## 2017-06-06 LAB — HEMOGLOBIN A1C
Hgb A1c MFr Bld: 5.6 % (ref 4.8–5.6)
MEAN PLASMA GLUCOSE: 114 mg/dL

## 2017-06-08 ENCOUNTER — Telehealth: Payer: Self-pay

## 2017-06-08 NOTE — Telephone Encounter (Signed)
D/C 06/05/17 To: home  Spoke with pt and she states that she is doing very well. She reports that she does have some residual pain but believes anxiety is a contributing factor. She states that the Ibuprofen does help. She has no further questions or concerns.   Appt not scheduled at this time. Pt states that she will call back to schedule.    Transition Care Management Follow-up Telephone Call  How have you been since you were released from the hospital? Very well, still some mild pain   Do you understand why you were in the hospital? yes   Do you understand the discharge instrcutions? yes  Items Reviewed:  Medications reviewed: yes  Allergies reviewed: yes  Dietary changes reviewed: yes  Referrals reviewed: yes   Functional Questionnaire:   Activities of Daily Living (ADLs):   She states they are independent in the following: ambulation, bathing and hygiene, feeding, continence, grooming, toileting and dressing States they require assistance with the following: none   Any transportation issues/concerns?: no   Any patient concerns? no   Confirmed importance and date/time of follow-up visits scheduled: yes   Confirmed with patient if condition begins to worsen call PCP or go to the ER.  Patient was given the Call-a-Nurse line [phone]: yes

## 2017-06-14 ENCOUNTER — Encounter: Payer: Self-pay | Admitting: Internal Medicine

## 2017-06-14 ENCOUNTER — Ambulatory Visit (INDEPENDENT_AMBULATORY_CARE_PROVIDER_SITE_OTHER): Payer: Medicare Other | Admitting: Internal Medicine

## 2017-06-14 VITALS — BP 130/82 | HR 72 | Temp 97.6°F | Wt 231.0 lb

## 2017-06-14 DIAGNOSIS — S59901A Unspecified injury of right elbow, initial encounter: Secondary | ICD-10-CM

## 2017-06-14 DIAGNOSIS — Z9181 History of falling: Secondary | ICD-10-CM | POA: Diagnosis not present

## 2017-06-14 DIAGNOSIS — S8002XA Contusion of left knee, initial encounter: Secondary | ICD-10-CM | POA: Diagnosis not present

## 2017-06-14 NOTE — Progress Notes (Signed)
Chief Complaint  Patient presents with  . Fall    right arm is still in pain     HPI: Dawn Jordan 51 y.o.  sda  appt  PCP NA  She was taking her mom in the wheelchair over a curb at the wheelchair rolled or slipped and to try to prevent her mom's injury she fell directly on her elbows and knees. This was July 6. She used ice however even though she has contusions on her knees and they are due for replacement she has more painful right elbow area without a lot of swelling but it seriously deep pain. She cannot extend the elbow for the last days had some numbness feeling in the right ring finger. She is right handed. She thought it might of been the fibromyalgia but this pain is different and more severe ROS: See pertinent positives and negatives per HPI. No fever  Weakness   Past Medical History:  Diagnosis Date  . Anemia   . Anginal pain ([place])   . Anxiety   . Asthma   . Chronic fatigue syndrome   . COPD (chronic obstructive pulmonary disease) ([place])   . Depression   . Fibromyalgia   . GERD (gastroesophageal reflux disease)   . Headache    "weekly-monthly" (06/04/2017)  . History of gout   . History of kidney stones   . Left sciatic nerve pain   . OSA (obstructive sleep apnea)    "getting ready to retest" (06/04/2017)  . Rheumatoid arthritis ([place])   . Seasonal allergies     Family History  Problem Relation Age of Onset  . Arthritis Mother   . Hyperlipidemia Mother   . Hypertension Mother   . Stroke Mother   . Mental retardation Mother   . Diabetes Mother   . Allergic rhinitis Mother   . Diabetes Maternal Grandfather   . Hypertension Maternal Grandfather   . Diabetes Paternal Grandmother   . Hypertension Paternal Grandmother   . Cancer Paternal Grandmother        breast  . Allergic rhinitis Brother   . Colon cancer Paternal Aunt   . Lung cancer Other   . Lung cancer Maternal Uncle   . Asthma Neg Hx   . Eczema Neg Hx   . Immunodeficiency Neg Hx   . Urticaria  Neg Hx   . Atopy Neg Hx   . Angioedema Neg Hx     Social History   Social History  . Marital status: Legally Separated    Spouse name: N/A  . Number of children: 0  . Years of education: N/A   Occupational History  . not employed-disabled    Social History Main Topics  . Smoking status: Never Smoker  . Smokeless tobacco: Never Used  . Alcohol use No  . Drug use: No  . Sexual activity: No   Other Topics Concern  . None   Social History Narrative  . None    Outpatient Medications Prior to Visit  Medication Sig Dispense Refill  . albuterol (PROVENTIL HFA;VENTOLIN HFA) 108 (90 Base) MCG/ACT inhaler Inhale 2 puffs into the lungs every 6 (six) hours as needed for wheezing or shortness of breath. 1 Inhaler 0  . aspirin EC 81 MG tablet Take 1 tablet (81 mg total) by mouth daily.    Marland Kitchen atomoxetine (STRATTERA) 100 MG capsule Take 1 capsule (100 mg total) by mouth daily. 30 capsule 1  . budesonide-formoterol (SYMBICORT) 160-4.5 MCG/ACT inhaler Inhale 2 puffs into the  lungs at bedtime.    . clonazePAM (KLONOPIN) 0.5 MG tablet Take 0.25 mg by mouth 2 (two) times daily as needed for anxiety.    . diclofenac sodium (VOLTAREN) 1 % GEL Apply 1 application topically 2 (two) times daily as needed (back and knee pain).     . DULoxetine (CYMBALTA) 30 MG capsule Take 3 capsules (90 mg total) by mouth daily. 90 capsule 1  . DULoxetine (CYMBALTA) 60 MG capsule Take 60 mg by mouth daily with lunch.   1  . etanercept (ENBREL) 50 MG/ML injection Inject 50 mg into the skin every Friday.    . flunisolide (NASALIDE) 25 MCG/ACT (0.025%) SOLN Place 1 spray into the nose 2 (two) times daily. (Patient taking differently: Place 1 spray into the nose 2 (two) times daily as needed (congestion). ) 1 Bottle 5  . leflunomide (ARAVA) 20 MG tablet Take 20 mg by mouth daily.  0  . levalbuterol (XOPENEX) 1.25 MG/3ML nebulizer solution Take 1.25 mg by nebulization every 4 (four) hours as needed for wheezing. 75 mL 1    . levocetirizine (XYZAL) 5 MG tablet Take 1 tablet (5 mg total) by mouth every evening. (Patient taking differently: Take 5 mg by mouth at bedtime. ) 30 tablet 5  . montelukast (SINGULAIR) 10 MG tablet Take 1 tablet (10 mg total) by mouth at bedtime. 90 tablet 1  . Na Sulfate-K Sulfate-Mg Sulf 17.5-3.13-1.6 GM/180ML SOLN Take 1 kit by mouth as directed. 354 mL 0  . nitroGLYCERIN (NITROSTAT) 0.4 MG SL tablet Place 1 tablet (0.4 mg total) under the tongue every 5 (five) minutes as needed for chest pain. 30 tablet 12  . omeprazole (PRILOSEC) 40 MG capsule Take 1 capsule (40 mg total) by mouth daily before breakfast. 30 capsule 3  . pregabalin (LYRICA) 200 MG capsule Take 200 mg by mouth 3 (three) times daily.     No facility-administered medications prior to visit.      EXAM:  BP 130/82 (BP Location: Left Arm, Patient Position: Sitting, Cuff Size: Large)   Pulse 72   Temp 97.6 F (36.4 C) (Oral)   Wt 231 lb (104.8 kg)   LMP  (LMP Unknown)   BMI 39.65 kg/m   Body mass index is 39.65 kg/m.  GENERAL: vitals reviewed and listed above, alert, oriented, appears well hydrated and in no acute distress does not extend right elbow but can go to about 120. HEENT: atraumatic, conjunctiva  clear, no obvious abnormalities on inspection of external nose and ears NECK: no obvious masses on inspection palpation  MS: Left knee with patellar and peripatellar contusion but no acute deformity. Some pain with range of motion but no joint effusion obvious. Right arm shows no bruising but some tenderness around the distal humerus. She does not extend the elbow fully comes of pain can raise her shoulder fine grip strength hard to elicit probably okay no obvious atrophy and vascular appears intact. PSYCH: pleasant and cooperative, no obvious depression or anxiety  ASSESSMENT AND PLAN:  Discussed the following assessment and plan:  Elbow injury, right, initial encounter  Hx of fall  Contusion of left  knee, initial encounter Called [facility] and will be seen dr Stann Mainland tomorrow at 11 am  Be there at 10 35    Sling given fro comfort   Delayed   X ray  Because [facility].  -Patient advised to return or notify health care team  if symptoms worsen ,persist or new concerns arise.  Patient Instructions   We'll plan on getting an orthopedic office to see you and plan x-ray depending on timing. Because unable to extend  arm and the significance of the pain even if the x-rays negative would get orthopedic consult.    Standley Brooking. Tulio Facundo M.D.

## 2017-06-14 NOTE — Patient Instructions (Signed)
  We'll plan on getting an orthopedic office to see you and plan x-ray depending on timing. Because unable to extend  arm and the significance of the pain even if the x-rays negative would get orthopedic consult.

## 2017-06-15 DIAGNOSIS — M25521 Pain in right elbow: Secondary | ICD-10-CM | POA: Diagnosis not present

## 2017-06-15 DIAGNOSIS — M7711 Lateral epicondylitis, right elbow: Secondary | ICD-10-CM | POA: Diagnosis not present
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
## 2017-06-18 DIAGNOSIS — M1712 Unilateral primary osteoarthritis, left knee: Secondary | ICD-10-CM | POA: Diagnosis not present

## 2017-06-18 DIAGNOSIS — M170 Bilateral primary osteoarthritis of knee: Secondary | ICD-10-CM | POA: Diagnosis not present

## 2017-06-21 ENCOUNTER — Ambulatory Visit (HOSPITAL_COMMUNITY): Payer: Self-pay | Admitting: Psychiatry

## 2017-06-29 ENCOUNTER — Encounter: Payer: Medicare Other | Attending: Psychology | Admitting: Psychology

## 2017-06-29 DIAGNOSIS — F419 Anxiety disorder, unspecified: Secondary | ICD-10-CM | POA: Insufficient documentation

## 2017-06-29 DIAGNOSIS — F4310 Post-traumatic stress disorder, unspecified: Secondary | ICD-10-CM | POA: Diagnosis not present

## 2017-06-29 DIAGNOSIS — F411 Generalized anxiety disorder: Secondary | ICD-10-CM

## 2017-06-29 DIAGNOSIS — F331 Major depressive disorder, recurrent, moderate: Secondary | ICD-10-CM | POA: Diagnosis not present

## 2017-06-29 DIAGNOSIS — F4312 Post-traumatic stress disorder, chronic: Secondary | ICD-10-CM | POA: Diagnosis not present

## 2017-06-29 DIAGNOSIS — F329 Major depressive disorder, single episode, unspecified: Secondary | ICD-10-CM | POA: Diagnosis present

## 2017-06-30 DIAGNOSIS — M25521 Pain in right elbow: Secondary | ICD-10-CM | POA: Diagnosis not present

## 2017-06-30 DIAGNOSIS — M7711 Lateral epicondylitis, right elbow: Secondary | ICD-10-CM | POA: Diagnosis not present

## 2017-07-06 ENCOUNTER — Encounter: Payer: Self-pay | Admitting: Psychology

## 2017-07-06 NOTE — Progress Notes (Signed)
The patient completed the comprehensive attention battery and the cab CPT. Full report to follow.

## 2017-07-11 DIAGNOSIS — M549 Dorsalgia, unspecified: Secondary | ICD-10-CM | POA: Diagnosis not present

## 2017-07-20 ENCOUNTER — Other Ambulatory Visit: Payer: Self-pay | Admitting: Allergy and Immunology

## 2017-07-20 DIAGNOSIS — J3089 Other allergic rhinitis: Secondary | ICD-10-CM

## 2017-07-27 DIAGNOSIS — M7711 Lateral epicondylitis, right elbow: Secondary | ICD-10-CM | POA: Diagnosis not present

## 2017-07-27 DIAGNOSIS — M25521 Pain in right elbow: Secondary | ICD-10-CM | POA: Diagnosis not present

## 2017-07-28 ENCOUNTER — Other Ambulatory Visit: Payer: Self-pay | Admitting: Allergy and Immunology

## 2017-07-28 ENCOUNTER — Other Ambulatory Visit: Payer: Self-pay | Admitting: Family Medicine

## 2017-07-28 DIAGNOSIS — R1013 Epigastric pain: Secondary | ICD-10-CM

## 2017-07-28 DIAGNOSIS — J4541 Moderate persistent asthma with (acute) exacerbation: Secondary | ICD-10-CM

## 2017-07-29 ENCOUNTER — Encounter: Payer: Medicare Other | Attending: Psychology | Admitting: Psychology

## 2017-07-29 ENCOUNTER — Telehealth (HOSPITAL_COMMUNITY): Payer: Self-pay | Admitting: *Deleted

## 2017-07-29 ENCOUNTER — Ambulatory Visit: Payer: Self-pay | Admitting: Psychology

## 2017-07-29 ENCOUNTER — Other Ambulatory Visit (HOSPITAL_COMMUNITY): Payer: Self-pay | Admitting: Psychiatry

## 2017-07-29 DIAGNOSIS — F331 Major depressive disorder, recurrent, moderate: Secondary | ICD-10-CM

## 2017-07-29 DIAGNOSIS — F411 Generalized anxiety disorder: Secondary | ICD-10-CM

## 2017-07-29 DIAGNOSIS — F4312 Post-traumatic stress disorder, chronic: Secondary | ICD-10-CM | POA: Diagnosis not present

## 2017-07-29 DIAGNOSIS — F419 Anxiety disorder, unspecified: Secondary | ICD-10-CM | POA: Insufficient documentation

## 2017-07-29 DIAGNOSIS — F4310 Post-traumatic stress disorder, unspecified: Secondary | ICD-10-CM | POA: Diagnosis not present

## 2017-07-29 DIAGNOSIS — F988 Other specified behavioral and emotional disorders with onset usually occurring in childhood and adolescence: Secondary | ICD-10-CM

## 2017-07-29 DIAGNOSIS — M0609 Rheumatoid arthritis without rheumatoid factor, multiple sites: Secondary | ICD-10-CM | POA: Diagnosis not present

## 2017-07-29 DIAGNOSIS — F329 Major depressive disorder, single episode, unspecified: Secondary | ICD-10-CM | POA: Diagnosis present

## 2017-07-29 MED ORDER — DULOXETINE HCL 30 MG PO CPEP: 90.0000 mg | ORAL_CAPSULE | Freq: Every day | ORAL | 0 refills | Status: DC

## 2017-07-29 NOTE — Telephone Encounter (Signed)
Pt pharmacy [facility] requesting refills for pt Duloxetine HCL DR 30 mg TID. Per pt chart, pt medication was last filled on 05-26-2017 with 90 cap 1 refill. Pt was last seen 05-26-17 and was to f/u on 06-21-2017 and no showed for appt. Pt has f/u for 08-23-2017.

## 2017-07-29 NOTE — Telephone Encounter (Signed)
Ordered for 30 days

## 2017-07-30 ENCOUNTER — Telehealth: Payer: Self-pay | Admitting: Psychology

## 2017-07-30 ENCOUNTER — Encounter: Payer: Self-pay | Admitting: Psychology

## 2017-07-30 DIAGNOSIS — M7711 Lateral epicondylitis, right elbow: Secondary | ICD-10-CM | POA: Diagnosis not present
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
## 2017-07-30 NOTE — Telephone Encounter (Signed)
noted 

## 2017-07-30 NOTE — Telephone Encounter (Signed)
FAXED DR RODENBOUGH'S ASSESSMENT TO [phone] FOR DR Armandina Stammer

## 2017-07-30 NOTE — Telephone Encounter (Signed)
Noted, thanks!

## 2017-08-04 ENCOUNTER — Encounter: Payer: Self-pay | Admitting: Gastroenterology

## 2017-08-04 ENCOUNTER — Ambulatory Visit (AMBULATORY_SURGERY_CENTER): Payer: Medicare Other | Admitting: Gastroenterology

## 2017-08-04 VITALS — BP 128/64 | HR 83 | Temp 98.0°F | Resp 19 | Ht 64.0 in | Wt 232.0 lb

## 2017-08-04 DIAGNOSIS — J45909 Unspecified asthma, uncomplicated: Secondary | ICD-10-CM | POA: Diagnosis not present

## 2017-08-04 DIAGNOSIS — M069 Rheumatoid arthritis, unspecified: Secondary | ICD-10-CM | POA: Diagnosis not present

## 2017-08-04 DIAGNOSIS — K219 Gastro-esophageal reflux disease without esophagitis: Secondary | ICD-10-CM | POA: Diagnosis not present

## 2017-08-04 DIAGNOSIS — I2510 Atherosclerotic heart disease of native coronary artery without angina pectoris: Secondary | ICD-10-CM | POA: Diagnosis not present

## 2017-08-04 DIAGNOSIS — R197 Diarrhea, unspecified: Secondary | ICD-10-CM | POA: Diagnosis not present

## 2017-08-04 DIAGNOSIS — K2970 Gastritis, unspecified, without bleeding: Secondary | ICD-10-CM

## 2017-08-04 DIAGNOSIS — K2950 Unspecified chronic gastritis without bleeding: Secondary | ICD-10-CM | POA: Diagnosis not present

## 2017-08-04 DIAGNOSIS — J449 Chronic obstructive pulmonary disease, unspecified: Secondary | ICD-10-CM | POA: Diagnosis not present

## 2017-08-04 DIAGNOSIS — M797 Fibromyalgia: Secondary | ICD-10-CM | POA: Diagnosis not present

## 2017-08-04 DIAGNOSIS — Z1211 Encounter for screening for malignant neoplasm of colon: Secondary | ICD-10-CM | POA: Diagnosis not present

## 2017-08-04 DIAGNOSIS — K2990 Gastroduodenitis, unspecified, without bleeding: Secondary | ICD-10-CM

## 2017-08-04 DIAGNOSIS — G4730 Sleep apnea, unspecified: Secondary | ICD-10-CM | POA: Diagnosis not present

## 2017-08-04 MED ORDER — SODIUM CHLORIDE 0.9 % IV SOLN: 500.0000 mL | INTRAVENOUS | Status: DC

## 2017-08-04 NOTE — Progress Notes (Signed)
Report to PACU, RN, vss, BBS= Clear.  

## 2017-08-04 NOTE — Op Note (Signed)
[place] Patient Name: Dawn Jordan Procedure Date: 08/04/2017 2:01 PM MRN: [id number] Endoscopist: Milus Banister , MD Age: 51 Referring MD:  Date of Birth: [birth year] Gender: Female Account #: [id number] Procedure:                Upper GI endoscopy Indications:              Heartburn Medicines:                Monitored Anesthesia Care Procedure:                Pre-Anesthesia Assessment:                           - Prior to the procedure, a History and Physical                            was performed, and patient medications and                            allergies were reviewed. The patient's tolerance of                            previous anesthesia was also reviewed. The risks                            and benefits of the procedure and the sedation                            options and risks were discussed with the patient.                            All questions were answered, and informed consent                            was obtained. Prior Anticoagulants: The patient has                            taken no previous anticoagulant or antiplatelet                            agents. ASA Grade Assessment: II - A patient with                            mild systemic disease. After reviewing the risks                            and benefits, the patient was deemed in                            satisfactory condition to undergo the procedure.                           After obtaining informed consent, the endoscope was  passed under direct vision. Throughout the                            procedure, the patient's blood pressure, pulse, and                            oxygen saturations were monitored continuously. The                            Endoscope was introduced through the mouth, and                            advanced to the second part of duodenum. The upper                            GI endoscopy was accomplished without  difficulty.                            The patient tolerated the procedure well. Scope In: Scope Out: Findings:                 The esophagus was normal.                           Mild inflammation characterized by erythema was                            found in the gastric antrum. Biopsies were taken                            with a cold forceps for histology.                           The examined duodenum was normal. Biopsies for                            histology were taken with a cold forceps for                            evaluation of celiac disease. Complications:            No immediate complications. Estimated blood loss:                            None. Estimated Blood Loss:     Estimated blood loss: none. Impression:               - Normal esophagus.                           - Mild gastritis. Biopsied to check for H. pylori                           - Normal examined duodenum. Biopsied to check for  Celiac [place] Recommendation:           - Patient has a contact number available for                            emergencies. The signs and symptoms of potential                            delayed complications were discussed with the                            patient. Return to normal activities tomorrow.                            Written discharge instructions were provided to the                            patient.                           - Resume previous diet.                           - Continue present medications.                           - Await pathology results.                           - For now please start ranitidine 150mg  pill, one                            pill at bedtime nightly. Milus Banister, MD 08/04/2017 2:38:51 PM This report has been signed electronically.

## 2017-08-04 NOTE — Progress Notes (Signed)
Called to room to assist during endoscopic procedure.  Patient ID and intended procedure confirmed with present staff. Received instructions for my participation in the procedure from the performing physician.  

## 2017-08-04 NOTE — Patient Instructions (Signed)
Discharge instructions given. Handouts on gastritis and diverticulosis. Resume previous medications. YOU HAD AN ENDOSCOPIC PROCEDURE TODAY AT [facility]:   Refer to the procedure report that was given to you for any specific questions about what was found during the examination.  If the procedure report does not answer your questions, please call your gastroenterologist to clarify.  If you requested that your care partner not be given the details of your procedure findings, then the procedure report has been included in a sealed envelope for you to review at your convenience later.  YOU SHOULD EXPECT: Some feelings of bloating in the abdomen. Passage of more gas than usual.  Walking can help get rid of the air that was put into your GI tract during the procedure and reduce the bloating. If you had a lower endoscopy (such as a colonoscopy or flexible sigmoidoscopy) you may notice spotting of blood in your stool or on the toilet paper. If you underwent a bowel prep for your procedure, you may not have a normal bowel movement for a few days.  Please Note:  You might notice some irritation and congestion in your nose or some drainage.  This is from the oxygen used during your procedure.  There is no need for concern and it should clear up in a day or so.  SYMPTOMS TO REPORT IMMEDIATELY:   Following lower endoscopy (colonoscopy or flexible sigmoidoscopy):  Excessive amounts of blood in the stool  Significant tenderness or worsening of abdominal pains  Swelling of the abdomen that is new, acute  Fever of 100F or higher   Following upper endoscopy (EGD)  Vomiting of blood or coffee ground material  New chest pain or pain under the shoulder blades  Painful or persistently difficult swallowing  New shortness of breath  Fever of 100F or higher  Black, tarry-looking stools  For urgent or emergent issues, a gastroenterologist can be reached at any hour by calling [phone]  [phone].   DIET:  We do recommend a small meal at first, but then you may proceed to your regular diet.  Drink plenty of fluids but you should avoid alcoholic beverages for 24 hours.  ACTIVITY:  You should plan to take it easy for the rest of today and you should NOT DRIVE or use heavy machinery until tomorrow (because of the sedation medicines used during the test).    FOLLOW UP: Our staff will call the number listed on your records the next business day following your procedure to check on you and address any questions or concerns that you may have regarding the information given to you following your procedure. If we do not reach you, we will leave a message.  However, if you are feeling well and you are not experiencing any problems, there is no need to return our call.  We will assume that you have returned to your regular daily activities without incident.  If any biopsies were taken you will be contacted by phone or by letter within the next 1-3 weeks.  Please call us at [phone] if you have not heard about the biopsies in 3 weeks.    SIGNATURES/CONFIDENTIALITY: You and/or your care partner have signed paperwork which will be entered into your electronic medical record.  These signatures attest to the fact that that the information above on your After Visit Summary has been reviewed and is understood.  Full responsibility of the confidentiality of this discharge information lies with you and/or your care-partner.

## 2017-08-04 NOTE — Op Note (Signed)
[place] Patient Name: Dawn Jordan Procedure Date: 08/04/2017 2:02 PM MRN: [id number] Endoscopist: Milus Banister , MD Age: 51 Referring MD:  Date of Birth: 09-24-1966 Gender: Female Account #: [id number] Procedure:                Colonoscopy Indications:              Chronic diarrhea Medicines:                Monitored Anesthesia Care Procedure:                Pre-Anesthesia Assessment:                           - Prior to the procedure, a History and Physical                            was performed, and patient medications and                            allergies were reviewed. The patient's tolerance of                            previous anesthesia was also reviewed. The risks                            and benefits of the procedure and the sedation                            options and risks were discussed with the patient.                            All questions were answered, and informed consent                            was obtained. Prior Anticoagulants: The patient has                            taken no previous anticoagulant or antiplatelet                            agents. ASA Grade Assessment: II - A patient with                            mild systemic disease. After reviewing the risks                            and benefits, the patient was deemed in                            satisfactory condition to undergo the procedure.                           After obtaining informed consent, the colonoscope  was passed under direct vision. Throughout the                            procedure, the patient's blood pressure, pulse, and                            oxygen saturations were monitored continuously. The                            Colonoscope was introduced through the anus and                            advanced to the the terminal ileum. The colonoscopy                            was performed without difficulty. The  patient                            tolerated the procedure well. The quality of the                            bowel preparation was excellent. The terminal                            ileum, ileocecal valve, appendiceal orifice, and                            rectum were photographed. Scope In: 2:16:12 PM Scope Out: 2:26:43 PM Scope Withdrawal Time: 0 hours 7 minutes 9 seconds  Total Procedure Duration: 0 hours 10 minutes 31 seconds  Findings:                 The terminal ileum appeared normal.                           Multiple small-mouthed diverticula were found in                            the left colon.                           The exam was otherwise without abnormality on                            direct and retroflexion views.                           Biopsies for histology were taken with a cold                            forceps from the entire colon for evaluation of                            microscopic colitis. Complications:            No immediate complications. Estimated blood loss:  None. Estimated Blood Loss:     Estimated blood loss: none. Impression:               - The examined portion of the ileum was normal.                           - Diverticulosis in the left colon.                           - The examination was otherwise normal on direct                            and retroflexion views.                           - Biopsies were taken with a cold forceps from the                            entire colon for evaluation of microscopic colitis. Recommendation:           - Patient has a contact number available for                            emergencies. The signs and symptoms of potential                            delayed complications were discussed with the                            patient. Return to normal activities tomorrow.                            Written discharge instructions were provided to the                             patient.                           - Resume previous diet.                           - Continue present medications.                           - Repeat colonoscopy in 10 years for screening                            purposes.                           - For now, please start taking one OTC imodium                            every morning shortly after waking. Milus Banister, MD 08/04/2017 2:29:14 PM This report has been signed electronically.

## 2017-08-05 ENCOUNTER — Telehealth: Payer: Self-pay | Admitting: *Deleted

## 2017-08-05 NOTE — Telephone Encounter (Signed)
  Follow up Call-  Call back number 08/04/2017  Post procedure Call Back phone  # [phone]  Permission to leave phone message Yes   [phone]  Patient questions:  Do you have a fever, pain , or abdominal swelling? No. Pain Score  0 *  Have you tolerated food without any problems? Yes.    Have you been able to return to your normal activities? Yes.    Do you have any questions about your discharge instructions: Diet   No. Medications  No. Follow up visit  No.  Do you have questions or concerns about your Care? No.  Actions: * If pain score is 4 or above: No action needed, pain <4.  Pt states she has had a headache off and on since procedure- has been taking Tylenol and that has helped

## 2017-08-13 DIAGNOSIS — M7711 Lateral epicondylitis, right elbow: Secondary | ICD-10-CM | POA: Diagnosis not present

## 2017-08-13 DIAGNOSIS — G5631 Lesion of radial nerve, right upper limb: Secondary | ICD-10-CM | POA: Diagnosis not present

## 2017-08-15 ENCOUNTER — Encounter: Payer: Self-pay | Admitting: Gastroenterology

## 2017-08-19 ENCOUNTER — Other Ambulatory Visit: Payer: Self-pay

## 2017-08-19 MED ORDER — BUDESONIDE-FORMOTEROL FUMARATE 160-4.5 MCG/ACT IN AERO: 2.0000 | INHALATION_SPRAY | Freq: Every day | RESPIRATORY_TRACT | 0 refills | Status: DC
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
## 2017-08-20 ENCOUNTER — Telehealth (HOSPITAL_COMMUNITY): Payer: Self-pay | Admitting: *Deleted

## 2017-08-20 NOTE — Telephone Encounter (Signed)
Pt pharmacy [facility] in [place] requesting 90 days supply and refills for pt Atomoxetine HCL 100 mg QD. Per pt chart, medication was last filled on 05-26-2017 with 30 tabs 1 refill. Pt f/u appt is 08-23-2017. Pt pharmacy number is [phone].

## 2017-08-21 DIAGNOSIS — G5631 Lesion of radial nerve, right upper limb: Secondary | ICD-10-CM | POA: Diagnosis not present

## 2017-08-23 ENCOUNTER — Ambulatory Visit (INDEPENDENT_AMBULATORY_CARE_PROVIDER_SITE_OTHER): Payer: Medicare Other | Admitting: Psychiatry

## 2017-08-23 ENCOUNTER — Encounter (HOSPITAL_COMMUNITY): Payer: Self-pay | Admitting: Psychiatry

## 2017-08-23 ENCOUNTER — Other Ambulatory Visit: Payer: Self-pay | Admitting: Allergy and Immunology

## 2017-08-23 VITALS — BP 140/89 | HR 81 | Ht 64.0 in | Wt 230.0 lb

## 2017-08-23 DIAGNOSIS — G4733 Obstructive sleep apnea (adult) (pediatric): Secondary | ICD-10-CM

## 2017-08-23 DIAGNOSIS — F331 Major depressive disorder, recurrent, moderate: Secondary | ICD-10-CM

## 2017-08-23 DIAGNOSIS — F909 Attention-deficit hyperactivity disorder, unspecified type: Secondary | ICD-10-CM | POA: Diagnosis not present

## 2017-08-23 DIAGNOSIS — F4310 Post-traumatic stress disorder, unspecified: Secondary | ICD-10-CM | POA: Diagnosis not present

## 2017-08-23 DIAGNOSIS — G4700 Insomnia, unspecified: Secondary | ICD-10-CM | POA: Diagnosis not present

## 2017-08-23 DIAGNOSIS — M797 Fibromyalgia: Secondary | ICD-10-CM

## 2017-08-23 DIAGNOSIS — Z79899 Other long term (current) drug therapy: Secondary | ICD-10-CM | POA: Diagnosis not present

## 2017-08-23 MED ORDER — DULOXETINE HCL 60 MG PO CPEP: 60.0000 mg | ORAL_CAPSULE | Freq: Two times a day (BID) | ORAL | 1 refills | Status: DC

## 2017-08-23 MED ORDER — ATOMOXETINE HCL 100 MG PO CAPS: 100.0000 mg | ORAL_CAPSULE | Freq: Every day | ORAL | 1 refills | Status: DC

## 2017-08-23 NOTE — Telephone Encounter (Signed)
Received fax for a 90 day for Symbicort 160, patient hasn't been seen since march. Patient has already had a courtesy refill, so request was refused patient needs office visit.

## 2017-08-23 NOTE — Telephone Encounter (Signed)
noted 

## 2017-08-23 NOTE — Telephone Encounter (Signed)
Will discuss today

## 2017-08-23 NOTE — Patient Instructions (Signed)
1. Increase duloxetine 60 mg twice a day 2. Continue Strattera 100 mg daily 3. Return to clinic in one month for 30 mins

## 2017-08-26 ENCOUNTER — Encounter: Payer: Self-pay | Admitting: Family Medicine

## 2017-08-27 DIAGNOSIS — M7711 Lateral epicondylitis, right elbow: Secondary | ICD-10-CM | POA: Diagnosis not present

## 2017-08-27 DIAGNOSIS — G5631 Lesion of radial nerve, right upper limb: Secondary | ICD-10-CM | POA: Diagnosis not present

## 2017-09-02 DIAGNOSIS — E669 Obesity, unspecified: Secondary | ICD-10-CM | POA: Diagnosis not present

## 2017-09-02 DIAGNOSIS — M797 Fibromyalgia: Secondary | ICD-10-CM | POA: Diagnosis not present

## 2017-09-02 DIAGNOSIS — Z79899 Other long term (current) drug therapy: Secondary | ICD-10-CM | POA: Diagnosis not present

## 2017-09-02 DIAGNOSIS — M2550 Pain in unspecified joint: Secondary | ICD-10-CM | POA: Diagnosis not present

## 2017-09-02 DIAGNOSIS — Z6838 Body mass index (BMI) 38.0-38.9, adult: Secondary | ICD-10-CM | POA: Diagnosis not present

## 2017-09-02 DIAGNOSIS — M0609 Rheumatoid arthritis without rheumatoid factor, multiple sites: Secondary | ICD-10-CM | POA: Diagnosis not present

## 2017-09-10 ENCOUNTER — Other Ambulatory Visit: Payer: Self-pay | Admitting: Family Medicine

## 2017-09-10 DIAGNOSIS — J45901 Unspecified asthma with (acute) exacerbation: Secondary | ICD-10-CM

## 2017-09-23 ENCOUNTER — Telehealth (HOSPITAL_COMMUNITY): Payer: Self-pay | Admitting: *Deleted

## 2017-09-23 ENCOUNTER — Other Ambulatory Visit (HOSPITAL_COMMUNITY): Payer: Self-pay | Admitting: Psychiatry

## 2017-09-23 MED ORDER — DULOXETINE HCL 60 MG PO CPEP: 60.0000 mg | ORAL_CAPSULE | Freq: Two times a day (BID) | ORAL | 0 refills | Status: DC

## 2017-09-23 NOTE — Telephone Encounter (Signed)
Pt pharmacy [facility] in [place] requesting 90 days supply for pt Duloxetine HCL DR 60 mg BID.  Pt pharmacy number is [phone]. Pt medication was last filled on 08-23-17 with 60 tabs 1 refill.

## 2017-09-23 NOTE — Telephone Encounter (Signed)
done

## 2017-09-24 DIAGNOSIS — G5631 Lesion of radial nerve, right upper limb: Secondary | ICD-10-CM | POA: Diagnosis not present

## 2017-09-24 DIAGNOSIS — M7711 Lateral epicondylitis, right elbow: Secondary | ICD-10-CM | POA: Diagnosis not present

## 2017-09-24 NOTE — Telephone Encounter (Signed)
noted 

## 2017-10-18 ENCOUNTER — Other Ambulatory Visit: Payer: Self-pay | Admitting: *Deleted

## 2017-10-18 DIAGNOSIS — R1013 Epigastric pain: Secondary | ICD-10-CM

## 2017-10-18 MED ORDER — OMEPRAZOLE 40 MG PO CPDR: 40.0000 mg | DELAYED_RELEASE_CAPSULE | Freq: Every day | ORAL | 1 refills | Status: DC

## 2017-10-21 ENCOUNTER — Other Ambulatory Visit (HOSPITAL_COMMUNITY): Payer: Self-pay | Admitting: Psychiatry

## 2017-10-21 MED ORDER — ATOMOXETINE HCL 100 MG PO CAPS: 100.0000 mg | ORAL_CAPSULE | Freq: Every day | ORAL | 0 refills | Status: DC

## 2017-10-22 ENCOUNTER — Other Ambulatory Visit: Payer: Self-pay | Admitting: Allergy and Immunology

## 2017-10-26 DIAGNOSIS — G5631 Lesion of radial nerve, right upper limb: Secondary | ICD-10-CM | POA: Diagnosis not present

## 2017-10-26 DIAGNOSIS — M7711 Lateral epicondylitis, right elbow: Secondary | ICD-10-CM | POA: Diagnosis not present

## 2017-10-27 ENCOUNTER — Ambulatory Visit (INDEPENDENT_AMBULATORY_CARE_PROVIDER_SITE_OTHER): Payer: Medicare Other | Admitting: Family Medicine

## 2017-10-27 ENCOUNTER — Encounter: Payer: Self-pay | Admitting: Family Medicine

## 2017-10-27 ENCOUNTER — Encounter: Payer: Self-pay | Admitting: *Deleted

## 2017-10-27 VITALS — BP 126/85 | HR 81 | Temp 98.2°F | Resp 16 | Ht 64.0 in | Wt 243.0 lb

## 2017-10-27 DIAGNOSIS — J3089 Other allergic rhinitis: Secondary | ICD-10-CM | POA: Diagnosis not present

## 2017-10-27 DIAGNOSIS — J069 Acute upper respiratory infection, unspecified: Secondary | ICD-10-CM

## 2017-10-27 DIAGNOSIS — J45901 Unspecified asthma with (acute) exacerbation: Secondary | ICD-10-CM | POA: Diagnosis not present

## 2017-10-27 MED ORDER — PREDNISONE 20 MG PO TABS: 40.0000 mg | ORAL_TABLET | Freq: Every day | ORAL | 0 refills | Status: AC

## 2017-10-27 MED ORDER — BENZONATATE 100 MG PO CAPS: 200.0000 mg | ORAL_CAPSULE | Freq: Two times a day (BID) | ORAL | 0 refills | Status: AC | PRN

## 2017-10-27 NOTE — Progress Notes (Signed)
ACUTE VISIT  HPI:  Chief Complaint  Patient presents with  . Cough    [name] is a 51 y.o.female here today complaining of 6 days of respiratory symptoms.  Symptoms started while she was in [place]. 5 days ago she had subjective fever that lasted 2-3 days.  URI   This is a new problem. The current episode started in the past 7 days. The problem has been gradually improving. Associated symptoms include congestion, coughing, headaches, a plugged ear sensation, rhinorrhea, a sore throat and wheezing. Pertinent negatives include no abdominal pain, chest pain, diarrhea, ear pain, nausea, neck pain, rash, swollen glands or vomiting. She has tried acetaminophen and inhaler use for the symptoms. The treatment provided mild relief.   Sore throat aggravated by coughing. No dysphagia or stridor. Cough spells that caused anxiety, no nausea or poss tussive emesis.  Productive cough until last night she had clear sputum and today "little yellowish."   No Hx of recent travel. + Sick contact, one of her friends during her trip to [place]. No known insect bite.  Hx of allergies: Yes, hx of asthma and allergic rhinitis. She used her Albuterol inh last night.  OTC medications for this problem: Tylenol and Nyquil.   Review of Systems  Constitutional: Positive for activity change, fatigue and fever. Negative for appetite change and chills.  HENT: Positive for congestion, postnasal drip, rhinorrhea, sinus pressure and sore throat. Negative for ear pain, facial swelling, mouth sores, trouble swallowing and voice change.   Eyes: Negative for discharge, redness and itching.  Respiratory: Positive for cough and wheezing. Negative for shortness of breath.   Cardiovascular: Negative for chest pain.  Gastrointestinal: Negative for abdominal pain, diarrhea, nausea and vomiting.  Musculoskeletal: Negative for arthralgias, gait problem and neck pain.  Skin: Negative for rash.    Allergic/Immunologic: Positive for environmental allergies.  Neurological: Positive for headaches. Negative for syncope, facial asymmetry and weakness.  Hematological: Negative for adenopathy. Does not bruise/bleed easily.  Psychiatric/Behavioral: Negative for confusion. The patient is nervous/anxious.       Current Outpatient Medications on File Prior to Visit  Medication Sig Dispense Refill  . albuterol (PROVENTIL HFA;VENTOLIN HFA) 108 (90 Base) MCG/ACT inhaler Inhale 2 puffs into the lungs every 6 (six) hours as needed for wheezing or shortness of breath. 1 Inhaler 0  . aspirin 81 MG tablet Take 81 mg by mouth daily.    Marland Kitchen aspirin EC 81 MG tablet Take 1 tablet (81 mg total) by mouth daily.    Marland Kitchen atomoxetine (STRATTERA) 100 MG capsule Take 1 capsule (100 mg total) daily by mouth. 30 capsule 0  . budesonide-formoterol (SYMBICORT) 160-4.5 MCG/ACT inhaler Inhale 2 puffs into the lungs at bedtime. 1 Inhaler 0  . clonazePAM (KLONOPIN) 0.5 MG tablet Take 0.25 mg by mouth 2 (two) times daily as needed for anxiety.    . diclofenac sodium (VOLTAREN) 1 % GEL Apply 1 application topically 2 (two) times daily as needed (back and knee pain).     . DULoxetine (CYMBALTA) 60 MG capsule Take 1 capsule (60 mg total) by mouth 2 (two) times daily. 180 capsule 0  . etanercept (ENBREL) 50 MG/ML injection Inject 50 mg into the skin every Friday.    . flunisolide (NASALIDE) 25 MCG/ACT (0.025%) SOLN Place 1 spray into the nose 2 (two) times daily. (Patient taking differently: Place 1 spray into the nose 2 (two) times daily as needed (congestion). ) 1 Bottle 5  . Insulin  Isophane & Regular Human (HUMULIN 70/30 KWIKPEN) (70-30) 100 UNIT/ML PEN Inject into the skin.    Marland Kitchen leflunomide (ARAVA) 20 MG tablet Take 20 mg by mouth daily.  0  . levalbuterol (XOPENEX) 1.25 MG/3ML nebulizer solution Take 1.25 mg by nebulization every 4 (four) hours as needed for wheezing. 75 mL 1  . levocetirizine (XYZAL) 5 MG tablet TAKE 1  TABLET BY MOUTH EVERY EVENING 30 tablet 4  . Loperamide HCl (IMODIUM PO) Take by mouth as needed.    . montelukast (SINGULAIR) 10 MG tablet TAKE 1 TABLET (10 MG TOTAL) BY MOUTH AT BEDTIME. 90 tablet 1  . nitroGLYCERIN (NITROSTAT) 0.4 MG SL tablet Place 1 tablet (0.4 mg total) under the tongue every 5 (five) minutes as needed for chest pain. 30 tablet 12  . omeprazole (PRILOSEC) 40 MG capsule Take 1 capsule (40 mg total) daily before breakfast by mouth. 90 capsule 1  . pregabalin (LYRICA) 200 MG capsule Take 200 mg by mouth 3 (three) times daily.     Current Facility-Administered Medications on File Prior to Visit  Medication Dose Route Frequency Provider Last Rate Last Dose  . 0.9 %  sodium chloride infusion  500 mL Intravenous Continuous Milus Banister, MD         Past Medical History:  Diagnosis Date  . Anemia   . Anginal pain ([place])   . Anxiety   . Asthma   . Chronic fatigue syndrome   . COPD (chronic obstructive pulmonary disease) ([place])   . Depression   . Fibromyalgia   . GERD (gastroesophageal reflux disease)   . Headache    "weekly-monthly" (06/04/2017)  . History of gout   . History of kidney stones   . Left sciatic nerve pain   . OSA (obstructive sleep apnea)    "getting ready to retest" (06/04/2017)  . Rheumatoid arthritis ([place])   . Seasonal allergies    Allergies  Allergen Reactions  . Eggs Or Egg-Derived Products Diarrhea  . Latex Anaphylaxis, Hives and Itching    "Anaphylaxis is only when I am in a closed area (ex: car with balloons)"  . Other Other (See Comments)    Sinus headache from new plastics, carpets, etc.  . Codeine Itching    Social History   Socioeconomic History  . Marital status: Legally Separated    Spouse name: None  . Number of children: 0  . Years of education: None  . Highest education level: None  Social Needs  . Financial resource strain: None  . Food insecurity - worry: None  . Food insecurity - inability: None  . Transportation  needs - medical: None  . Transportation needs - non-medical: None  Occupational History  . Occupation: not employed-disabled  Tobacco Use  . Smoking status: Never Smoker  . Smokeless tobacco: Never Used  Substance and Sexual Activity  . Alcohol use: No  . Drug use: No  . Sexual activity: No  Other Topics Concern  . None  Social History Narrative  . None    Vitals:   10/27/17 1457  BP: 126/85  Pulse: 81  Resp: 16  Temp: 98.2 F (36.8 C)  SpO2: 100%   Body mass index is 41.71 kg/m.   Physical Exam  Nursing note and vitals reviewed. Constitutional: She is oriented to person, place, and time. She appears well-developed. She does not appear ill. No distress.  HENT:  Head: Normocephalic and atraumatic.  Right Ear: Tympanic membrane, external ear and ear canal normal.  Left Ear: Tympanic membrane, external ear and ear canal normal.  Nose: Rhinorrhea present. Right sinus exhibits no maxillary sinus tenderness and no frontal sinus tenderness. Left sinus exhibits no maxillary sinus tenderness and no frontal sinus tenderness.  Mouth/Throat: Oropharynx is clear and moist and mucous membranes are normal.  Clear rhinorrhea, hypertrophic turbinates. Nasal voice. Post nasal drainage.  Eyes: Conjunctivae are normal.  Cardiovascular: Normal rate and regular rhythm.  No murmur heard. Respiratory: Effort normal and breath sounds normal. No stridor. No respiratory distress. She has no wheezes. She has no rales.  Prolonged expiration.  Lymphadenopathy:       Head (right side): No submandibular adenopathy present.       Head (left side): No submandibular adenopathy present.    She has no cervical adenopathy.  Neurological: She is alert and oriented to person, place, and time. She has normal strength. Gait normal.  Skin: Skin is warm. No rash noted. No erythema.  Psychiatric: Her mood appears anxious.  Well groomed, good eye contact.    ASSESSMENT AND PLAN:   Dawn Jordan was seen  today for cough.  Diagnoses and all orders for this visit:  URI, acute  Symptoms suggests a viral etiology, symptomatic treatment recommended, so I do not think abx is needed at this time. Instructed to let me know in 5-7 days if she is not feeling any better, before if symptoms are getting worse. So abx can be considered.  Instructed to monitor for signs of complications, including new onset of fever among some, clearly instructed about warning signs. I also explained that cough and nasal congestion can last a few days and sometimes weeks. F/U as needed.  -     benzonatate (TESSALON) 100 MG capsule; Take 2 capsules (200 mg total) by mouth 2 (two) times daily as needed for up to 10 days for cough.  Mild asthma with acute exacerbation, unspecified whether persistent  She has taken Prednisone before and has been well tolerated. Recommend 5 days of Prednisone,side effects discussed. Albuterol inh 2 puff every 6 hours for a week then as needed for wheezing or shortness of breath.    -     predniSONE (DELTASONE) 20 MG tablet; Take 2 tablets (40 mg total) by mouth daily with breakfast for 5 days.  Allergic rhinitis  Can also be aggravating problem. Prednisone may help with acute symptoms. Nasal irrigations with saline and steam inhalations may also help.      -Dawn Jordan was advised to seek attention immediately if symptoms worsen or to follow if they persist or new concerns arise.       Korri Ask G. Martin[place], MD  [facility]. [place] office.

## 2017-10-27 NOTE — Patient Instructions (Addendum)
A few things to remember from today's visit:   URI, acute - Plan: benzonatate (TESSALON) 100 MG capsule  Mild asthma with acute exacerbation, unspecified whether persistent - Plan: predniSONE (DELTASONE) 20 MG tablet  viral infections are self-limited and we treat each symptom depending of severity.  Over the counter medications as decongestants and cold medications usually help, they need to be taken with caution if there is a history of high blood pressure or palpitations. Tylenol and/or Ibuprofen also helps with most symptoms (headache, muscle aching, fever,etc) Plenty of fluids. Honey helps with cough. Steam inhalations helps with runny nose, nasal congestion, and may prevent sinus infections. Cough and nasal congestion could last a few days and sometimes weeks. Please follow in not any better in 1-2 weeks or if symptoms get worse.  Please be sure medication list is accurate. If a new problem present, please set up appointment sooner than planned today.

## 2017-10-29 ENCOUNTER — Encounter: Payer: Self-pay | Admitting: Family Medicine

## 2017-10-31 ENCOUNTER — Other Ambulatory Visit: Payer: Self-pay | Admitting: Allergy and Immunology

## 2017-11-12 ENCOUNTER — Other Ambulatory Visit: Payer: Self-pay | Admitting: Allergy and Immunology

## 2017-11-12 DIAGNOSIS — J3089 Other allergic rhinitis: Secondary | ICD-10-CM

## 2017-11-12 NOTE — Telephone Encounter (Signed)
Received fax for 90 day supply for Xyzal. Patient was last seen in march. Patient needs office visit.
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
## 2017-11-15 ENCOUNTER — Ambulatory Visit (HOSPITAL_COMMUNITY): Payer: Self-pay | Admitting: Psychiatry

## 2017-11-16 ENCOUNTER — Other Ambulatory Visit: Payer: Self-pay

## 2017-11-16 DIAGNOSIS — J3089 Other allergic rhinitis: Secondary | ICD-10-CM

## 2017-11-16 MED ORDER — LEVOCETIRIZINE DIHYDROCHLORIDE 5 MG PO TABS: 5.0000 mg | ORAL_TABLET | Freq: Every evening | ORAL | 0 refills | Status: DC

## 2017-11-17 ENCOUNTER — Encounter: Payer: Self-pay | Admitting: Family Medicine

## 2017-11-17 ENCOUNTER — Ambulatory Visit (INDEPENDENT_AMBULATORY_CARE_PROVIDER_SITE_OTHER): Payer: Medicare Other | Admitting: Family Medicine

## 2017-11-17 ENCOUNTER — Ambulatory Visit: Payer: Self-pay

## 2017-11-17 VITALS — BP 132/74 | HR 74 | Temp 97.9°F | Wt 244.6 lb

## 2017-11-17 DIAGNOSIS — J209 Acute bronchitis, unspecified: Secondary | ICD-10-CM | POA: Diagnosis not present

## 2017-11-17 MED ORDER — PREDNISONE 10 MG PO TABS
ORAL_TABLET | ORAL | 0 refills | Status: DC
Start: 2017-11-17 — End: 2017-12-30

## 2017-11-17 MED ORDER — AZITHROMYCIN 250 MG PO TABS: ORAL_TABLET | ORAL | 0 refills | Status: DC

## 2017-11-17 MED ORDER — HYDROCODONE-HOMATROPINE 5-1.5 MG/5ML PO SYRP: 5.0000 mL | ORAL_SOLUTION | ORAL | 0 refills | Status: DC | PRN

## 2017-11-17 NOTE — Progress Notes (Signed)
   Subjective:    Patient ID: Dawn Jordan, female    DOB: 1966-08-27, 51 y.o.   MRN: [id number]  HPI Here for 3 weeks of PND, chest tightness and coughing up yellow sputum. No fever. She was seen here on 10-27-17 and this was felt to be a viral illness. She was told to drink fluids and was given Benzonatate with little effect. She took a short round of Prednisone which did help for a few days. Now she is still coughing up yellow sputum and she has wheezing and SOB.    Review of Systems  Constitutional: Negative.   HENT: Positive for postnasal drip and sore throat. Negative for sinus pressure and sinus pain.   Eyes: Negative.   Respiratory: Positive for cough, chest tightness, shortness of breath and wheezing.   Cardiovascular: Negative.        Objective:   Physical Exam  Constitutional: She appears well-developed and well-nourished.  Frequent deep barking coughs   HENT:  Right Ear: External ear normal.  Left Ear: External ear normal.  Nose: Nose normal.  Mouth/Throat: Oropharynx is clear and moist.  Eyes: Conjunctivae are normal.  Neck: No thyromegaly present.  Pulmonary/Chest: Effort normal. No respiratory distress. She has rales.  Scattered rhonchi and wheezes   Lymphadenopathy:    She has no cervical adenopathy.          Assessment & Plan:  Bronchitis, possibly atypical. Given a Zpack. She is also given a 20 day prednisone taper starting with 40 mg a day. Use Hydromet for sleep. She has albuterol via an inhaler and via a nebulizer at home to use prn.  Alysia Penna, MD

## 2017-11-17 NOTE — Telephone Encounter (Signed)
Pt. called with c/o worsening of cough.  Reported she saw Dr. Martinique 11/21 and prescribed Prednisone and Tessalon Perles.  Stated she feels the cough is worse, and she is relying on the rescue inhaler 4 times/ day with little relief.  Stated intermittent expectoration of clear to light yellow mucus.  Reported she has started having nasal stuffiness and sinus congestion. C/o increased episodes of shortness of breath. Reported being exhausted from the cough.  Stated "I just can't take this anymore."   Per protocol, scheduled appt. with [facility], after speaking with Flow Coordinator.  Pt. Agrees with plan.   Reason for Disposition . SEVERE coughing spells (e.g., whooping sound after coughing, vomiting after coughing)  Answer Assessment - Initial Assessment Questions 1. ONSET: "When did the cough begin?"      It started about 3.5 weeks ago  2. SEVERITY: "How bad is the cough today?"      Worsened cough since seen in office 11/21; using rescue inhaler 2 puffs 4x/ day 3. RESPIRATORY DISTRESS: "Describe your breathing."      Has episodes of increased shortness of breath 4. FEVER: "Do you have a fever?" If so, ask: "What is your temperature, how was it measured, and when did it start?"     No  5. SPUTUM: "Describe the color of your sputum" (clear, white, yellow, green)     Clear to light yellow 6. HEMOPTYSIS: "Are you coughing up any blood?" If so ask: "How much?" (flecks, streaks, tablespoons, etc.)     No, but has tasted blood in back of mouth 7. CARDIAC HISTORY: "Do you have any history of heart disease?" (e.g., heart attack, congestive heart failure)      Hx of irreg heart beat ; mother with severe CAD 8. LUNG HISTORY: "Do you have any history of lung disease?"  (e.g., pulmonary embolus, asthma, emphysema)    asthma 9. PE RISK FACTORS: "Do you have a history of blood clots?" (or: recent major surgery, recent prolonged travel, bedridden )    No  10. OTHER SYMPTOMS: "Do you have any other  symptoms?" (e.g., runny nose, wheezing, chest pain)       Nasal stuffiness with sinus congestion; denies wheezing  11. PREGNANCY: "Is there any chance you are pregnant?" "When was your last menstrual period?"       No; LMP 2-3 years ago 12. TRAVEL: "Have you traveled out of the country in the last month?" (e.g., travel history, exposures)       No  Protocols used: [place]

## 2017-11-18 DIAGNOSIS — M170 Bilateral primary osteoarthritis of knee: Secondary | ICD-10-CM | POA: Diagnosis not present

## 2017-11-24 ENCOUNTER — Ambulatory Visit: Payer: Self-pay | Admitting: Family Medicine

## 2017-12-01 ENCOUNTER — Other Ambulatory Visit: Payer: Self-pay

## 2017-12-01 NOTE — Telephone Encounter (Signed)
Received fax for 90 day supply of Symbicort 160. Patient was last seen 03/01/2017. Patient needs office visit.

## 2017-12-03 ENCOUNTER — Other Ambulatory Visit: Payer: Self-pay | Admitting: Allergy and Immunology

## 2017-12-08 ENCOUNTER — Other Ambulatory Visit: Payer: Self-pay | Admitting: Family Medicine

## 2017-12-08 NOTE — Telephone Encounter (Signed)
Last OV 11/17/2017 acute bronchitis . Rx was last refilled 11/17/2017 disp 950 with no refills. Sent to Dr. Sarajane Jews

## 2017-12-09 ENCOUNTER — Other Ambulatory Visit (HOSPITAL_COMMUNITY): Payer: Self-pay | Admitting: Psychiatry

## 2017-12-09 ENCOUNTER — Telehealth (HOSPITAL_COMMUNITY): Payer: Self-pay | Admitting: Professional

## 2017-12-09 MED ORDER — ATOMOXETINE HCL 100 MG PO CAPS: 100.0000 mg | ORAL_CAPSULE | Freq: Every day | ORAL | 0 refills | Status: DC

## 2017-12-10 NOTE — Telephone Encounter (Signed)
She will need to follow up with Dr. Martinique

## 2017-12-13 ENCOUNTER — Other Ambulatory Visit (HOSPITAL_COMMUNITY): Payer: Medicare Other | Attending: Psychiatry | Admitting: Licensed Clinical Social Worker

## 2017-12-13 DIAGNOSIS — F4310 Post-traumatic stress disorder, unspecified: Secondary | ICD-10-CM | POA: Insufficient documentation

## 2017-12-13 DIAGNOSIS — F331 Major depressive disorder, recurrent, moderate: Secondary | ICD-10-CM | POA: Insufficient documentation
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
## 2017-12-23 ENCOUNTER — Encounter (HOSPITAL_COMMUNITY): Payer: Self-pay | Admitting: Psychiatry

## 2017-12-23 ENCOUNTER — Ambulatory Visit (INDEPENDENT_AMBULATORY_CARE_PROVIDER_SITE_OTHER): Payer: Medicare Other | Admitting: Psychiatry

## 2017-12-23 VITALS — BP 130/83 | HR 78 | Ht 64.0 in | Wt 238.0 lb

## 2017-12-23 DIAGNOSIS — Z79899 Other long term (current) drug therapy: Secondary | ICD-10-CM

## 2017-12-23 DIAGNOSIS — F4310 Post-traumatic stress disorder, unspecified: Secondary | ICD-10-CM | POA: Diagnosis not present

## 2017-12-23 DIAGNOSIS — J45909 Unspecified asthma, uncomplicated: Secondary | ICD-10-CM

## 2017-12-23 DIAGNOSIS — Z810 Family history of intellectual disabilities: Secondary | ICD-10-CM | POA: Diagnosis not present

## 2017-12-23 DIAGNOSIS — F909 Attention-deficit hyperactivity disorder, unspecified type: Secondary | ICD-10-CM | POA: Diagnosis not present

## 2017-12-23 DIAGNOSIS — F321 Major depressive disorder, single episode, moderate: Secondary | ICD-10-CM | POA: Diagnosis not present

## 2017-12-23 DIAGNOSIS — M069 Rheumatoid arthritis, unspecified: Secondary | ICD-10-CM | POA: Diagnosis not present

## 2017-12-23 DIAGNOSIS — M797 Fibromyalgia: Secondary | ICD-10-CM | POA: Diagnosis not present

## 2017-12-23 DIAGNOSIS — G4733 Obstructive sleep apnea (adult) (pediatric): Secondary | ICD-10-CM | POA: Diagnosis not present

## 2017-12-23 MED ORDER — ATOMOXETINE HCL 100 MG PO CAPS: 100.0000 mg | ORAL_CAPSULE | Freq: Every day | ORAL | 0 refills | Status: DC

## 2017-12-23 MED ORDER — CLONAZEPAM 0.5 MG PO TABS: 0.2500 mg | ORAL_TABLET | Freq: Two times a day (BID) | ORAL | 2 refills | Status: DC | PRN

## 2017-12-23 MED ORDER — DULOXETINE HCL 30 MG PO CPEP: ORAL_CAPSULE | ORAL | 0 refills | Status: DC

## 2017-12-23 MED ORDER — DULOXETINE HCL 60 MG PO CPEP: ORAL_CAPSULE | ORAL | 0 refills | Status: DC

## 2017-12-23 NOTE — Patient Instructions (Signed)
1. Decrease duloxetine 90 mg (60 mg + 30 mg ) daily  2. Continue Strattera 100 mg daily 3. Return to clinic in three months for 15 mins 4. Continue clonazepam 0.25 mg twice a day as needed for anxiety

## 2017-12-30 ENCOUNTER — Encounter: Payer: Self-pay | Admitting: Allergy & Immunology

## 2017-12-30 ENCOUNTER — Ambulatory Visit (INDEPENDENT_AMBULATORY_CARE_PROVIDER_SITE_OTHER): Payer: Medicare Other | Admitting: Allergy & Immunology

## 2017-12-30 VITALS — BP 112/74 | HR 84 | Resp 17

## 2017-12-30 DIAGNOSIS — J0100 Acute maxillary sinusitis, unspecified: Secondary | ICD-10-CM | POA: Diagnosis not present

## 2017-12-30 DIAGNOSIS — Z9109 Other allergy status, other than to drugs and biological substances: Secondary | ICD-10-CM | POA: Diagnosis not present

## 2017-12-30 DIAGNOSIS — J4540 Moderate persistent asthma, uncomplicated: Secondary | ICD-10-CM

## 2017-12-30 DIAGNOSIS — J302 Other seasonal allergic rhinitis: Secondary | ICD-10-CM | POA: Diagnosis not present

## 2017-12-30 DIAGNOSIS — J3089 Other allergic rhinitis: Secondary | ICD-10-CM | POA: Diagnosis not present

## 2017-12-30 MED ORDER — LEVOCETIRIZINE DIHYDROCHLORIDE 5 MG PO TABS: 5.0000 mg | ORAL_TABLET | Freq: Every evening | ORAL | 5 refills | Status: DC

## 2017-12-30 MED ORDER — TIOTROPIUM BROMIDE MONOHYDRATE 1.25 MCG/ACT IN AERS
2.0000 | INHALATION_SPRAY | Freq: Every day | RESPIRATORY_TRACT | 5 refills | Status: DC
Start: 2017-12-30 — End: 2018-10-21

## 2017-12-30 MED ORDER — MONTELUKAST SODIUM 10 MG PO TABS: 10.0000 mg | ORAL_TABLET | Freq: Every day | ORAL | 5 refills | Status: DC

## 2017-12-30 MED ORDER — AMOXICILLIN-POT CLAVULANATE 875-125 MG PO TABS: 1.0000 | ORAL_TABLET | Freq: Two times a day (BID) | ORAL | 0 refills | Status: AC

## 2017-12-30 MED ORDER — BUDESONIDE-FORMOTEROL FUMARATE 160-4.5 MCG/ACT IN AERO: 2.0000 | INHALATION_SPRAY | Freq: Two times a day (BID) | RESPIRATORY_TRACT | 5 refills | Status: DC

## 2017-12-30 MED ORDER — OLOPATADINE HCL 0.2 % OP SOLN: 1.0000 [drp] | Freq: Two times a day (BID) | OPHTHALMIC | 5 refills | Status: DC | PRN

## 2017-12-30 NOTE — Progress Notes (Signed)
FOLLOW UP  Date of Service/Encounter:  12/30/17   Assessment:   Moderate persistent asthma without complication  Seasonal and perennial allergic rhinitis  Acute non-recurrent maxillary sinusitis  Allergy to metal  Plan/Recommendations:   1. Moderate persistent asthma without complication - Lung testing looked normal today.  - We will add on Spiriva two puffs once daily to see if this can help with asthma control.   - Daily controller medication(s): Singulair 10mg  daily, Symbicort 160/4.45mcg two puffs twice daily with spacer and Spiriva 1.37mcg two puffs once daily  - Prior to physical activity: ProAir 2 puffs 10-15 minutes before physical activity. - Rescue medications: ProAir 4 puffs every 4-6 hours as needed - Asthma control goals:  * Full participation in all desired activities (may need albuterol before activity) * Albuterol use two time or less a week on average (not counting use with activity) * Cough interfering with sleep two time or less a month * Oral steroids no more than once a year * No hospitalizations  2. Seasonal and perennial allergic rhinitis (indoor molds, outdoor molds, cockroach, dust mites) - Continue with Singulair 10mg  daily. - Continue with Xyzal 5mg  daily. - Consider nasal saline rinses. - Add Pataday one drop per eye twice daily as needed.   3. Acute non-recurrent maxillary sinusitis - With your current symptoms and time course, antibiotics are needed: Augmentin 875mg  one tablet twice daily for ten days - Add on nasal saline spray (i.e., Simply Saline) or nasal saline lavage (i.e., NeilMed) as needed prior to medicated nasal sprays. - For thick post nasal drainage, add guaifenesin [phone] mg (Mucinex)  twice daily as needed with adequate hydration as discussed.  4. Allergy to metal - Continue to avoid triggering metals (nickel, chromium, copper sulfate)  5. Return in about 3 months (around 03/30/2018). I also provided a number for the  [employer] so that she can see about getting her student debt forgiven since her school no longer has accreditation.   Subjective:   Dawn Jordan is a 52 y.o. female presenting today for follow up of  Chief Complaint  Patient presents with  . Other    Pt c/o congestion, pressure in ears, and sinus pressire x 1 week. Taking OTC meds with no relief.     Dawn Jordan has a history of the following: Patient Active Problem List   Diagnosis Date Noted  . Asthma 06/04/2017  . Musculoskeletal chest pain 06/04/2017  . Special screening for malignant neoplasms, colon 04/06/2017  . Gastroesophageal reflux disease 04/06/2017  . Abdominal pain, epigastric 04/06/2017  . Chronic diarrhea 04/06/2017  . OSA (obstructive sleep apnea) 03/30/2017  . Major depressive disorder, single episode, moderate ([place]) 03/09/2017  . Moderate persistent asthma 01/28/2017  . History of food allergy 01/28/2017  . Dermatitis, contact 01/28/2017  . Allergic rhinitis 12/22/2016  . Fibromyalgia syndrome 09/21/2016  . Attention deficit hyperactivity disorder (ADHD) 09/21/2016  . PTSD (post-traumatic stress disorder) 09/21/2016  . BMI 37.0-37.9, adult   . Chronic rheumatic arthritis ([place]) 12/07/2014    History obtained from: chart review and patient.  [facility] Primary Care Provider is [place], Betty G, MD.     Dawn Jordan is a 52 y.o. female presenting for a sick visit. She was last in February 2018. At that time, she was started on Xyzal 5mg  daily as well as flunisolide as needed. She has a history of asthma and was started on Symbicort 160/4.5 two puffs BID. She was also started on a prednisone  burst and Singulair 10mg  daily. She had testing that was positive to mold mix 1/2/4, cockroach, and dust mite.   Since the last visit, she reports that she has had marked congestion as well as dry eyes. Symptoms have been ongoing for four days. She did try using her Sudafed, but has been using it every 45  minutes. She denies fevers. Overall her symptoms are worsening. She does endorse ear popping and fullness. She has been using Mucinex in addition to the Sudafed with minimal improvement.  Asthma/Respiratory Symptom History: She remains on the Symbicort 160/4.5 two puffs twice daily. Dawn Jordan's asthma has been well controlled. She has not required rescue medication, experienced nocturnal awakenings due to lower respiratory symptoms, nor have activities of daily living been limited. She has required no Emergency Department or Urgent Care visits for her asthma. She has required one course of systemic steroids (December 2018) for asthma exacerbations since the last visit. ACT score today is 15, indicating subpar asthma symptom control. Overall she feels that the Symbicort provided some improvement to her symptom control.   Allergic Rhinitis Symptom History: She does not do well with the nasal sprays. She is on the montelukast at night. She is on the Xyzal every day. She knows that she needs to use her nasal steroid more often that she currently is.   Otherwise, there have been no changes to her past medical history, surgical history, family history, or social history. 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     Review of Systems: a 14-point review of systems is pertinent for what is mentioned in HPI.  Otherwise, all other systems were negative. Constitutional: negative other than that listed in the HPI Eyes: negative other than that listed in the HPI Ears, nose, mouth, throat, and face: negative other than that listed in the HPI Respiratory: negative other than that listed in the HPI Cardiovascular: negative other than that listed in the HPI Gastrointestinal: negative other than that listed in the HPI Genitourinary: negative other than that listed in the HPI Integument: negative other than that listed in the HPI Hematologic:  negative other than that listed in the HPI Musculoskeletal: negative other than that listed in the HPI Neurological: negative other than that listed in the HPI Allergy/Immunologic: negative other than that listed in the HPI    Objective:   Blood pressure 112/74, pulse 84, resp. rate 17, SpO2 98 %. There is no height or weight on file to calculate BMI.   Physical Exam:  General: Alert, interactive, in no acute distress. Pleasant.  Eyes: No conjunctival injection bilaterally, no discharge on the right, no discharge on the left and no Horner-Trantas dots present. PERRL bilaterally. EOMI without pain. No photophobia.  Ears: Right TM pearly gray with normal light reflex, Left TM pearly gray with normal light reflex, Right TM intact without perforation and Left TM intact without perforation.  Nose/Throat: External nose within normal limits, nasal crease present and septum midline. Turbinates edematous and pale with thick discharge. Posterior oropharynx erythematous without cobblestoning in the posterior oropharynx. Tonsils 2+ without exudates.  Tongue without thrush. Adenopathy: no enlarged lymph nodes appreciated in the anterior cervical, occipital, axillary, epitrochlear, inguinal, or popliteal regions. Lungs: Clear to auscultation without wheezing, rhonchi or rales. No increased work of breathing. CV: Normal S1/S2. No murmurs. Capillary refill <2 seconds.  Skin: Warm and dry, without lesions or rashes. Neuro:   Grossly intact. No  focal deficits appreciated. Responsive to questions.  Diagnostic studies:   Spirometry: results abnormal (FEV1: 2.36/85%, FVC: 2.82/80%, FEV1/FVC: 84%).    Spirometry consistent with normal pattern.   Allergy Studies: none     Salvatore Marvel, MD [facility]

## 2017-12-30 NOTE — Patient Instructions (Addendum)
1. Moderate persistent asthma without complication - Lung testing looked normal today.  - We will add on Spiriva two puffs once daily to se eif this can help with asthma control.   - Daily controller medication(s): Singulair 10mg  daily, Symbicort 160/4.39mcg two puffs twice daily with spacer and Spiriva 1.83mcg two puffs once daily  - Prior to physical activity: ProAir 2 puffs 10-15 minutes before physical activity. - Rescue medications: ProAir 4 puffs every 4-6 hours as needed - Asthma control goals:  * Full participation in all desired activities (may need albuterol before activity) * Albuterol use two time or less a week on average (not counting use with activity) * Cough interfering with sleep two time or less a month * Oral steroids no more than once a year * No hospitalizations  2. Seasonal and perennial allergic rhinitis (indoor molds, outdoor molds, cockroach, dust mites) - Continue with Singulair 10mg  daily. - Continue with Xyzal 5mg  daily. - Consider nasal saline rinses. - Add Pataday one drop per eye twice daily as needed.   3. Acute non-recurrent maxillary sinusitis - With your current symptoms and time course, antibiotics are needed: Augmentin 875mg  one tablet twice daily for ten days - Add on nasal saline spray (i.e., Simply Saline) or nasal saline lavage (i.e., NeilMed) as needed prior to medicated nasal sprays. - For thick post nasal drainage, add guaifenesin [phone] mg (Mucinex)  twice daily as needed with adequate hydration as discussed.  4. Allergy to metal - Continue to avoid triggering metals (nickel, chromium, copper sulfate)  5. Return in about 3 months (around 03/30/2018).   Please inform us of any Emergency Department visits, hospitalizations, or changes in symptoms. Call us before going to the ED for breathing or allergy symptoms since we might be able to fit you in for a sick visit. Feel free to contact us anytime with any questions, problems, or  concerns.  It was a pleasure to meet you today! Happy New Year!   Websites that have reliable patient information: 1. American Academy of Asthma, Allergy, and Immunology: www.aaaai.org 2. Food Allergy Research and Education (FARE): foodallergy.org 3. Mothers of Asthmatics: http://www.asthmacommunitynetwork.org 4. American College of Allergy, Asthma, and Immunology: www.acaai.org

## 2017-12-31 ENCOUNTER — Encounter: Payer: Self-pay | Admitting: Allergy & Immunology

## 2018-01-03 ENCOUNTER — Telehealth: Payer: Self-pay

## 2018-01-03 NOTE — Telephone Encounter (Signed)
Received fax stating that Spiriva Respimat 1.25 MCG is not covered by insurance and need an alternative. Please advise.

## 2018-01-04 NOTE — Telephone Encounter (Signed)
Called and was in the middle of talking with patient about this medication and she stated that she did not receive a sample and then the phone started beeping and patient was no longer on phone. I tried to call back and left message for patient to call office in regards to this medication.

## 2018-01-04 NOTE — Telephone Encounter (Signed)
Well there is no good alternative for Spiriva. First, let's call her to see how she likes the medication and if she feels that it is working. If she is not getting any improvement from it, there is no sense picking this fight.  If she does like it, we can try seeing if Trelegy is approved by her insurance (this contains a long-acting inhaled steroid and two forms of long-acting bronchodilators). This would replace her Symbicort AND Spiriva.   Thanks, Salvatore Marvel, MD [name]

## 2018-01-05 NOTE — Telephone Encounter (Signed)
Called and left message for patient to call office in regards to her Spiriva.

## 2018-02-01 ENCOUNTER — Telehealth (HOSPITAL_COMMUNITY): Payer: Self-pay | Admitting: *Deleted

## 2018-02-01 ENCOUNTER — Encounter (HOSPITAL_COMMUNITY): Payer: Self-pay | Admitting: Psychiatry

## 2018-02-01 NOTE — Telephone Encounter (Signed)
noted 

## 2018-02-01 NOTE — Telephone Encounter (Signed)
Dr Modesta Messing Patient called in stating she's out of Strattera & that since she's has been on for a long period you 2 discussed maybe changing. Patient stated she is back in school &  It's extremely difficult for her to focus. She can't even focus enough to study for final exams. # [phone] for call back if needed

## 2018-02-01 NOTE — Telephone Encounter (Signed)
Could you ask her how much strattera she is taking? I ordered 90 days on 12/23/17 and she should not run out medication yet.

## 2018-02-01 NOTE — Telephone Encounter (Signed)
Next appointment is 03/28/18. Request refill on Strattera

## 2018-02-01 NOTE — Telephone Encounter (Signed)
This encounter was created in error - please disregard.

## 2018-02-01 NOTE — Telephone Encounter (Signed)
Dr Modesta Messing Patient tried to inform me about in the past begin on other med's like Adderall & I just asked if she would be willing to make appointment sooner than the 03/28/18 to discuss with you, Transferred to front office to schedule appointment

## 2018-02-01 NOTE — Telephone Encounter (Signed)
Advise her to make sooner appointment if she wants to change from strattera to stimulant. She needs to be seen as Stimulant can worsen her mood symptoms.

## 2018-02-02 ENCOUNTER — Ambulatory Visit (INDEPENDENT_AMBULATORY_CARE_PROVIDER_SITE_OTHER): Payer: Medicare Other | Admitting: Licensed Clinical Social Worker

## 2018-02-02 ENCOUNTER — Encounter (HOSPITAL_COMMUNITY): Payer: Self-pay | Admitting: Licensed Clinical Social Worker

## 2018-02-02 DIAGNOSIS — F902 Attention-deficit hyperactivity disorder, combined type: Secondary | ICD-10-CM

## 2018-02-02 DIAGNOSIS — F4310 Post-traumatic stress disorder, unspecified: Secondary | ICD-10-CM
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
## 2018-02-04 ENCOUNTER — Ambulatory Visit (INDEPENDENT_AMBULATORY_CARE_PROVIDER_SITE_OTHER): Payer: Medicare Other | Admitting: Psychiatry

## 2018-02-04 ENCOUNTER — Encounter (HOSPITAL_COMMUNITY): Payer: Self-pay | Admitting: Psychiatry

## 2018-02-04 VITALS — BP 148/100 | HR 102 | Ht 64.0 in | Wt 242.0 lb

## 2018-02-04 DIAGNOSIS — Z736 Limitation of activities due to disability: Secondary | ICD-10-CM

## 2018-02-04 DIAGNOSIS — F909 Attention-deficit hyperactivity disorder, unspecified type: Secondary | ICD-10-CM

## 2018-02-04 DIAGNOSIS — Z818 Family history of other mental and behavioral disorders: Secondary | ICD-10-CM

## 2018-02-04 DIAGNOSIS — M0609 Rheumatoid arthritis without rheumatoid factor, multiple sites: Secondary | ICD-10-CM | POA: Diagnosis not present

## 2018-02-04 DIAGNOSIS — R450 Nervousness: Secondary | ICD-10-CM

## 2018-02-04 DIAGNOSIS — Z560 Unemployment, unspecified: Secondary | ICD-10-CM | POA: Diagnosis not present

## 2018-02-04 DIAGNOSIS — F321 Major depressive disorder, single episode, moderate: Secondary | ICD-10-CM | POA: Diagnosis not present

## 2018-02-04 DIAGNOSIS — F4310 Post-traumatic stress disorder, unspecified: Secondary | ICD-10-CM

## 2018-02-04 DIAGNOSIS — F419 Anxiety disorder, unspecified: Secondary | ICD-10-CM | POA: Diagnosis not present

## 2018-02-04 MED ORDER — METHYLPHENIDATE HCL ER 18 MG PO TB24: 18.0000 mg | ORAL_TABLET | Freq: Every day | ORAL | 0 refills | Status: DC

## 2018-02-04 MED ORDER — METHYLPHENIDATE HCL 5 MG PO TABS: 5.0000 mg | ORAL_TABLET | Freq: Two times a day (BID) | ORAL | 0 refills | Status: DC

## 2018-02-04 NOTE — Patient Instructions (Addendum)
1. Continue duloxetine 90 mg (60 mg + 30 mg ) daily  2. Start methylphenidate 5 mg twice a day (morning and noon) 3. Discontinue Strattera 4. Continue clonazepam 0.25 mg twice a day as needed for anxiety 5.Return to clinic in one month for 15 mins

## 2018-02-04 NOTE — Addendum Note (Signed)
Addended by: Norman Clay on: 02/04/2018 11:50 AM   Modules accepted: Orders

## 2018-03-02 DIAGNOSIS — M5416 Radiculopathy, lumbar region: Secondary | ICD-10-CM | POA: Diagnosis not present
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
## 2018-03-08 ENCOUNTER — Ambulatory Visit (INDEPENDENT_AMBULATORY_CARE_PROVIDER_SITE_OTHER): Payer: Medicare Other | Admitting: Psychiatry

## 2018-03-08 ENCOUNTER — Encounter (HOSPITAL_COMMUNITY): Payer: Self-pay | Admitting: Psychiatry

## 2018-03-08 VITALS — BP 122/85 | HR 72 | Ht 64.0 in | Wt 244.0 lb

## 2018-03-08 DIAGNOSIS — F4310 Post-traumatic stress disorder, unspecified: Secondary | ICD-10-CM

## 2018-03-08 DIAGNOSIS — Z736 Limitation of activities due to disability: Secondary | ICD-10-CM

## 2018-03-08 DIAGNOSIS — F909 Attention-deficit hyperactivity disorder, unspecified type: Secondary | ICD-10-CM | POA: Diagnosis not present

## 2018-03-08 DIAGNOSIS — F419 Anxiety disorder, unspecified: Secondary | ICD-10-CM | POA: Diagnosis not present

## 2018-03-08 DIAGNOSIS — R450 Nervousness: Secondary | ICD-10-CM

## 2018-03-08 DIAGNOSIS — F321 Major depressive disorder, single episode, moderate: Secondary | ICD-10-CM

## 2018-03-08 DIAGNOSIS — Z560 Unemployment, unspecified: Secondary | ICD-10-CM

## 2018-03-08 MED ORDER — DULOXETINE HCL 30 MG PO CPEP: ORAL_CAPSULE | ORAL | 0 refills | Status: DC

## 2018-03-08 MED ORDER — DULOXETINE HCL 60 MG PO CPEP: ORAL_CAPSULE | ORAL | 0 refills | Status: DC

## 2018-03-08 MED ORDER — METHYLPHENIDATE HCL 10 MG PO TABS: 10.0000 mg | ORAL_TABLET | Freq: Two times a day (BID) | ORAL | 0 refills | Status: DC

## 2018-03-08 NOTE — Patient Instructions (Signed)
1.Continue duloxetine 90 mg (60 mg + 30 mg ) daily  2.Increase methylphenidate 10 mg in AM, at noon 3. Continue clonazepam 0.25 mg twice a day as needed for anxiety 5.Return to clinic in one month for 15 mins

## 2018-03-10 DIAGNOSIS — M545 Low back pain: Secondary | ICD-10-CM | POA: Diagnosis not present

## 2018-03-12 DIAGNOSIS — M545 Low back pain: Secondary | ICD-10-CM | POA: Diagnosis not present

## 2018-03-12 DIAGNOSIS — M5136 Other intervertebral disc degeneration, lumbar region: Secondary | ICD-10-CM | POA: Diagnosis not present

## 2018-03-16 DIAGNOSIS — M0609 Rheumatoid arthritis without rheumatoid factor, multiple sites: Secondary | ICD-10-CM | POA: Diagnosis not present

## 2018-03-16 DIAGNOSIS — M2550 Pain in unspecified joint: Secondary | ICD-10-CM | POA: Diagnosis not present

## 2018-03-16 DIAGNOSIS — M797 Fibromyalgia: Secondary | ICD-10-CM | POA: Diagnosis not present

## 2018-03-16 DIAGNOSIS — Z79899 Other long term (current) drug therapy: Secondary | ICD-10-CM | POA: Diagnosis not present

## 2018-03-16 DIAGNOSIS — M7989 Other specified soft tissue disorders: Secondary | ICD-10-CM | POA: Diagnosis not present

## 2018-03-16 DIAGNOSIS — Z6841 Body Mass Index (BMI) 40.0 and over, adult: Secondary | ICD-10-CM | POA: Diagnosis not present

## 2018-03-17 ENCOUNTER — Ambulatory Visit (INDEPENDENT_AMBULATORY_CARE_PROVIDER_SITE_OTHER): Payer: Medicare Other | Admitting: Licensed Clinical Social Worker

## 2018-03-17 ENCOUNTER — Encounter (HOSPITAL_COMMUNITY): Payer: Self-pay | Admitting: Licensed Clinical Social Worker

## 2018-03-17 DIAGNOSIS — F4310 Post-traumatic stress disorder, unspecified: Secondary | ICD-10-CM

## 2018-03-17 DIAGNOSIS — F909 Attention-deficit hyperactivity disorder, unspecified type: Secondary | ICD-10-CM

## 2018-03-17 NOTE — Progress Notes (Signed)
   THERAPIST PROGRESS NOTE  Session Time: 3:30pm-4:30pm  Participation Level: Active  Behavioral Response: Well GroomedAlertAngry, Anxious, Depressed and Irritable  Type of Therapy: Family Therapy  Treatment Goals addressed: "to get the ADHD under control and process trauma"  Interventions: CBT, Motivational Interviewing, grounding and mindfulness techniques, psychoeducation  Summary: [name] is a 52 y.o. female who presents with PTSD and ADHD Combined Presentation  Suicidal/Homicidal: No - without intent/plan  Therapist Response: Hiba and her wife met with clinician for a family session. Jacquelynn discussed her psychiatric symptoms, her current life events and her homework. [name] presented as extremely irritable, angry, paranoid and panicked in session. She reported a great deal of worry about the stability of her relationship, as well as several other issues in her life. Clinician utilized MI OARS to reflect and summarize thoughts and feelings. Clinician explored the usefulness of the paranoia and identified concerns that relationship would be in jeopardy if paranoia and jealousy does not get under control soon. Clinician engaged [name] and wife in [place] family therapy and discussed options for improving the relationship. [name] identified difficulty with coping when her routine gets interrupted. Wife agreed and encouraged [name] to work on flexibility. Clinician engaged both to work on Armed forces logistics/support/administrative officer with a separate couples counselor in order to address needs in the relationship.   Plan: Return again in 2-3 weeks.Transition [name] to Dr. Doyne Keel due to transportation issues getting to [facility] for Dr. Modesta Messing.   Diagnosis:     Axis I: PTSD and ADHD Combined Presentation   [name] 03/17/2018

## 2018-03-28 ENCOUNTER — Ambulatory Visit (HOSPITAL_COMMUNITY): Payer: Medicare Other | Admitting: Psychiatry

## 2018-03-29 ENCOUNTER — Ambulatory Visit (HOSPITAL_COMMUNITY): Payer: Medicare Other | Admitting: Licensed Clinical Social Worker

## 2018-03-29 DIAGNOSIS — M797 Fibromyalgia: Secondary | ICD-10-CM | POA: Diagnosis not present

## 2018-03-29 DIAGNOSIS — Z79899 Other long term (current) drug therapy: Secondary | ICD-10-CM | POA: Diagnosis not present

## 2018-03-29 DIAGNOSIS — Z6839 Body mass index (BMI) 39.0-39.9, adult: Secondary | ICD-10-CM | POA: Diagnosis not present

## 2018-03-29 DIAGNOSIS — M2550 Pain in unspecified joint: Secondary | ICD-10-CM | POA: Diagnosis not present

## 2018-03-29 DIAGNOSIS — M0609 Rheumatoid arthritis without rheumatoid factor, multiple sites: Secondary | ICD-10-CM | POA: Diagnosis not present

## 2018-03-29 DIAGNOSIS — E669 Obesity, unspecified: Secondary | ICD-10-CM | POA: Diagnosis not present
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
## 2018-04-14 DIAGNOSIS — Z1231 Encounter for screening mammogram for malignant neoplasm of breast: Secondary | ICD-10-CM | POA: Diagnosis not present

## 2018-04-14 LAB — HM MAMMOGRAPHY

## 2018-04-15 ENCOUNTER — Ambulatory Visit (INDEPENDENT_AMBULATORY_CARE_PROVIDER_SITE_OTHER): Payer: Medicare Other | Admitting: Psychiatry

## 2018-04-15 ENCOUNTER — Encounter (HOSPITAL_COMMUNITY): Payer: Self-pay | Admitting: Psychiatry

## 2018-04-15 VITALS — BP 105/67 | HR 80 | Ht 64.0 in | Wt 238.0 lb

## 2018-04-15 DIAGNOSIS — G4700 Insomnia, unspecified: Secondary | ICD-10-CM

## 2018-04-15 DIAGNOSIS — Z818 Family history of other mental and behavioral disorders: Secondary | ICD-10-CM | POA: Diagnosis not present

## 2018-04-15 DIAGNOSIS — Z560 Unemployment, unspecified: Secondary | ICD-10-CM | POA: Diagnosis not present

## 2018-04-15 DIAGNOSIS — F321 Major depressive disorder, single episode, moderate: Secondary | ICD-10-CM | POA: Diagnosis not present

## 2018-04-15 DIAGNOSIS — F419 Anxiety disorder, unspecified: Secondary | ICD-10-CM | POA: Diagnosis not present

## 2018-04-15 DIAGNOSIS — F909 Attention-deficit hyperactivity disorder, unspecified type: Secondary | ICD-10-CM

## 2018-04-15 DIAGNOSIS — F4310 Post-traumatic stress disorder, unspecified: Secondary | ICD-10-CM | POA: Diagnosis not present

## 2018-04-15 DIAGNOSIS — Z736 Limitation of activities due to disability: Secondary | ICD-10-CM | POA: Diagnosis not present

## 2018-04-15 DIAGNOSIS — R450 Nervousness: Secondary | ICD-10-CM

## 2018-04-15 MED ORDER — METHYLPHENIDATE HCL 10 MG PO TABS: 10.0000 mg | ORAL_TABLET | Freq: Two times a day (BID) | ORAL | 0 refills | Status: DC

## 2018-04-15 MED ORDER — DULOXETINE HCL 30 MG PO CPEP
ORAL_CAPSULE | ORAL | 0 refills | Status: DC
Start: 2018-04-15 — End: 2018-05-26

## 2018-04-15 MED ORDER — DULOXETINE HCL 60 MG PO CPEP: ORAL_CAPSULE | ORAL | 0 refills | Status: DC

## 2018-04-15 MED ORDER — CLONAZEPAM 0.5 MG PO TABS: 0.2500 mg | ORAL_TABLET | Freq: Two times a day (BID) | ORAL | 1 refills | Status: DC | PRN

## 2018-04-15 NOTE — Patient Instructions (Signed)
1.Continueduloxetine 90 mg (60 mg + 30 mg ) daily (could not tolerate higher dose due to nausea) 2.Continue methylphenidate 10 mg in AM, at noon 3. Continue clonazepam 0.25 mg twice a day as needed for anxiety 5.Keep an appointment with Dr. Doyne Keel

## 2018-04-20 ENCOUNTER — Encounter: Payer: Self-pay | Admitting: Family Medicine

## 2018-04-21 ENCOUNTER — Encounter (HOSPITAL_COMMUNITY): Payer: Self-pay | Admitting: Licensed Clinical Social Worker

## 2018-04-21 ENCOUNTER — Ambulatory Visit (INDEPENDENT_AMBULATORY_CARE_PROVIDER_SITE_OTHER): Payer: Medicare Other | Admitting: Licensed Clinical Social Worker

## 2018-04-21 DIAGNOSIS — F4310 Post-traumatic stress disorder, unspecified: Secondary | ICD-10-CM

## 2018-04-21 DIAGNOSIS — F909 Attention-deficit hyperactivity disorder, unspecified type: Secondary | ICD-10-CM

## 2018-04-21 DIAGNOSIS — F902 Attention-deficit hyperactivity disorder, combined type: Secondary | ICD-10-CM

## 2018-04-21 DIAGNOSIS — F331 Major depressive disorder, recurrent, moderate: Secondary | ICD-10-CM

## 2018-04-21 NOTE — Progress Notes (Signed)
   THERAPIST PROGRESS NOTE  Session Time: 3:30pm-4:30pm  Participation Level: Active  Behavioral Response: NeatAlertEuthymic  Type of Therapy: Individual Therapy  Treatment Goals addressed: "to get the ADHD under control and process trauma"  Interventions: CBT, Motivational Interviewing, grounding and mindfulness techniques, psychoeducation  Summary: Jazmene Racz is a 52 y.o. female who presents with PTSD and ADHD Combined Presentation  Suicidal/Homicidal: No - without intent/plan  Therapist Response: Danijela met with clinician for an individual session. Michaeleen discussed her psychiatric symptoms, her current life events and her homework. Zaya reported that she has been feeling much more stable than at last session. She reports she has been getting along better with wife and notes that her wife has been more forthcoming with interactions with others and including her in the conversations. Clinician utilized MI OARS to reflect and summarize thoughts and feelings. Clinician noted the importance of trust in a relationship and the value of being able know your partner believes and loves you.  Natavia processed her relationships with step-daughter and her mother. She reports things have improved a bit with mother and she is feeling more relaxed.  Clinician explored thoughts and feelings about changing psychiatrists and was able to process final session with Dr. Modesta Messing. Clinician validated Alazia's thoughts about the transfer and agreed that this would be a positive change.  Clinician explored hx of Bipolar D/O, mixed, moderate and explained that this may be possible, but encouraged Anona to wait to see what Dr. Doyne Keel says at the assessment.   Plan: Return again in 3-4 weeks.  Diagnosis:     Axis I: PTSD and ADHD Combined Presentation   [name], LCSW 04/21/2018

## 2018-04-22 DIAGNOSIS — M5137 Other intervertebral disc degeneration, lumbosacral region: Secondary | ICD-10-CM | POA: Diagnosis not present

## 2018-05-05 ENCOUNTER — Ambulatory Visit (INDEPENDENT_AMBULATORY_CARE_PROVIDER_SITE_OTHER): Payer: Medicare Other | Admitting: Licensed Clinical Social Worker

## 2018-05-05 ENCOUNTER — Encounter (HOSPITAL_COMMUNITY): Payer: Self-pay | Admitting: Licensed Clinical Social Worker

## 2018-05-05 DIAGNOSIS — F4310 Post-traumatic stress disorder, unspecified: Secondary | ICD-10-CM | POA: Diagnosis not present

## 2018-05-05 DIAGNOSIS — F909 Attention-deficit hyperactivity disorder, unspecified type: Secondary | ICD-10-CM

## 2018-05-05 NOTE — Progress Notes (Signed)
   THERAPIST PROGRESS NOTE  Session Time: 2:30pm-3:30pm  Participation Level: Active  Behavioral Response: NeatAlertEuthymic  Type of Therapy: Individual Therapy  Treatment Goals addressed: "to get the ADHD under control and process trauma"  Interventions: CBT, Motivational Interviewing, grounding and mindfulness techniques, psychoeducation  Summary: Dawn Jordan is a 51 y.o. female who presents with PTSD and ADHD Combined Presentation  Suicidal/Homicidal: No - without intent/plan  Therapist Response: Dawn Jordan met with clinician for an individual session. Dawn Jordan discussed her psychiatric symptoms, her current life events and her homework. Dawn Jordan reports a great deal of pain in her back, shoulder, and knees. However, she identified that she continues to work through the pain. Dawn Jordan identified improvement in her mood stability and jealousy. However, she continues to "keep tabs" on her wife's friendship with a man from work. Clinician applauded this improvement. Clinician utilized MI OARS to reflect and summarize Dawn Jordan's ability to maintain her composure and to feel more relaxed in her relationship.   Plan: Return again in 3-4 weeks.  Diagnosis:     Axis I: PTSD and ADHD Combined Presentation   Dawn R Schlosberg, LCSW 05/05/2018  

## 2018-05-06 DIAGNOSIS — M5412 Radiculopathy, cervical region: Secondary | ICD-10-CM | POA: Diagnosis not present

## 2018-05-06 DIAGNOSIS — M545 Low back pain: Secondary | ICD-10-CM | POA: Diagnosis not present

## 2018-05-19 ENCOUNTER — Ambulatory Visit (INDEPENDENT_AMBULATORY_CARE_PROVIDER_SITE_OTHER): Payer: Medicare Other | Admitting: Licensed Clinical Social Worker

## 2018-05-19 ENCOUNTER — Encounter (HOSPITAL_COMMUNITY): Payer: Self-pay | Admitting: Licensed Clinical Social Worker

## 2018-05-19 DIAGNOSIS — F4310 Post-traumatic stress disorder, unspecified: Secondary | ICD-10-CM

## 2018-05-19 DIAGNOSIS — F909 Attention-deficit hyperactivity disorder, unspecified type: Secondary | ICD-10-CM
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
## 2018-05-24 DIAGNOSIS — Z6839 Body mass index (BMI) 39.0-39.9, adult: Secondary | ICD-10-CM | POA: Diagnosis not present

## 2018-05-24 DIAGNOSIS — Z79899 Other long term (current) drug therapy: Secondary | ICD-10-CM | POA: Diagnosis not present

## 2018-05-24 DIAGNOSIS — E669 Obesity, unspecified: Secondary | ICD-10-CM | POA: Diagnosis not present

## 2018-05-24 DIAGNOSIS — M2550 Pain in unspecified joint: Secondary | ICD-10-CM | POA: Diagnosis not present

## 2018-05-24 DIAGNOSIS — M797 Fibromyalgia: Secondary | ICD-10-CM | POA: Diagnosis not present

## 2018-05-24 DIAGNOSIS — M0609 Rheumatoid arthritis without rheumatoid factor, multiple sites: Secondary | ICD-10-CM | POA: Diagnosis not present

## 2018-05-26 ENCOUNTER — Other Ambulatory Visit (HOSPITAL_COMMUNITY): Payer: Self-pay | Admitting: Psychiatry

## 2018-05-26 ENCOUNTER — Telehealth (HOSPITAL_COMMUNITY): Payer: Self-pay | Admitting: *Deleted

## 2018-05-26 MED ORDER — DULOXETINE HCL 60 MG PO CPEP: ORAL_CAPSULE | ORAL | 0 refills | Status: DC

## 2018-05-26 MED ORDER — METHYLPHENIDATE HCL 10 MG PO TABS: 10.0000 mg | ORAL_TABLET | Freq: Two times a day (BID) | ORAL | 0 refills | Status: DC

## 2018-05-26 MED ORDER — DULOXETINE HCL 30 MG PO CPEP: ORAL_CAPSULE | ORAL | 0 refills | Status: DC

## 2018-05-26 MED ORDER — CLONAZEPAM 0.5 MG PO TABS: 0.2500 mg | ORAL_TABLET | Freq: Two times a day (BID) | ORAL | 0 refills | Status: DC

## 2018-05-26 NOTE — Telephone Encounter (Signed)
Dr Modesta Messing Patient is transferring to [facility] office DR Doyne Keel & she doesn't have a appointment until 07/28/18. Requesting refills on med's

## 2018-05-26 NOTE — Telephone Encounter (Signed)
ordered

## 2018-06-02 ENCOUNTER — Ambulatory Visit (INDEPENDENT_AMBULATORY_CARE_PROVIDER_SITE_OTHER): Payer: Medicare Other | Admitting: Licensed Clinical Social Worker

## 2018-06-02 ENCOUNTER — Encounter (HOSPITAL_COMMUNITY): Payer: Self-pay | Admitting: Licensed Clinical Social Worker

## 2018-06-02 DIAGNOSIS — F902 Attention-deficit hyperactivity disorder, combined type: Secondary | ICD-10-CM | POA: Diagnosis not present

## 2018-06-02 DIAGNOSIS — F4310 Post-traumatic stress disorder, unspecified: Secondary | ICD-10-CM

## 2018-06-02 NOTE — Progress Notes (Signed)
   THERAPIST PROGRESS NOTE  Session Time: 4:30pm-5:30pm  Participation Level: Active  Behavioral Response: NeatAlertAnxious  Type of Therapy: Individual Therapy  Treatment Goals addressed: "to get the ADHD under control and process trauma"  Interventions: CBT, Motivational Interviewing, grounding and mindfulness techniques, psychoeducation  Summary: Trixie Maclaren is a 52 y.o. female who presents with PTSD and ADHD Combined Presentation  Suicidal/Homicidal: No - without intent/plan  Therapist Response: Lashanna met with clinician for an individual session. Daun discussed her psychiatric symptoms, her current life events and her homework. Amzie reports she has been up and down. She identified past hx of Bipolar, moderate, mixed, which has been more influential lately. Kyleah processed relationships with family members, wife, daughter, mother, brother. She also discussed her concerns about daughter and the relationship she is in. Clinician utilized MI OARS to reflect and summarize thoughts and feelings. Clinician offered alternative thoughts about daughter's choices and explored Sherlynn's decision making skills at age 63.  Arriyah discussed concerns about medication, as she will not see Dr. Doyne Keel until the end of August, but she is currently out of medication. Clinician made contact with nurse to request a call back to renew prescriptions until appointment with Dr. Doyne Keel.   Plan: Return again in 3-4 weeks.  Diagnosis:     Axis I: PTSD and ADHD Combined Presentation   [name], LCSW 06/02/2018

## 2018-06-16 ENCOUNTER — Encounter

## 2018-06-16 ENCOUNTER — Ambulatory Visit (HOSPITAL_COMMUNITY): Payer: Self-pay | Admitting: Psychiatry

## 2018-06-18 ENCOUNTER — Other Ambulatory Visit: Payer: Self-pay | Admitting: Allergy & Immunology

## 2018-06-23 ENCOUNTER — Ambulatory Visit (HOSPITAL_COMMUNITY): Payer: Medicare Other | Admitting: Licensed Clinical Social Worker

## 2018-06-29 ENCOUNTER — Other Ambulatory Visit (HOSPITAL_COMMUNITY): Payer: Self-pay

## 2018-07-03 ENCOUNTER — Other Ambulatory Visit: Payer: Self-pay | Admitting: Allergy & Immunology

## 2018-07-05 ENCOUNTER — Other Ambulatory Visit: Payer: Self-pay | Admitting: Allergy

## 2018-07-05 ENCOUNTER — Telehealth: Payer: Self-pay | Admitting: Allergy

## 2018-07-05 ENCOUNTER — Other Ambulatory Visit: Payer: Self-pay | Admitting: Allergy & Immunology

## 2018-07-05 MED ORDER — BUDESONIDE-FORMOTEROL FUMARATE 160-4.5 MCG/ACT IN AERO: INHALATION_SPRAY | RESPIRATORY_TRACT | 0 refills | Status: DC

## 2018-07-05 NOTE — Telephone Encounter (Signed)
Dr. Ernst Bowler On Dawn Jordan is the Symbicort 160 suppose to be two puffs once or twice a day. I send prescription in for two puffs twice a day.Please advise.Thank you

## 2018-07-05 NOTE — Telephone Encounter (Signed)
Dr. Ernst Bowler I called the Pharmacy  And they said that the last prescription was for two puffs twice a day. I am Sorry to have bothered you.

## 2018-07-06 NOTE — Telephone Encounter (Signed)
Two puffs twice daily. Thanks much! You are never a bother, Dawn Jordan!   Salvatore Marvel, MD [facility]

## 2018-07-12 ENCOUNTER — Ambulatory Visit (INDEPENDENT_AMBULATORY_CARE_PROVIDER_SITE_OTHER): Payer: Medicare Other | Admitting: Licensed Clinical Social Worker

## 2018-07-12 ENCOUNTER — Encounter (HOSPITAL_COMMUNITY): Payer: Self-pay | Admitting: Licensed Clinical Social Worker

## 2018-07-12 DIAGNOSIS — F902 Attention-deficit hyperactivity disorder, combined type: Secondary | ICD-10-CM

## 2018-07-12 DIAGNOSIS — F4310 Post-traumatic stress disorder, unspecified: Secondary | ICD-10-CM

## 2018-07-12 NOTE — Progress Notes (Signed)
   THERAPIST PROGRESS NOTE  Session Time: 11:00am-11:55am  Participation Level: Active  Behavioral Response: NeatAlertDepressed  Type of Therapy: Individual Therapy  Treatment Goals addressed: "to get the ADHD under control and process trauma"  Interventions: CBT, Motivational Interviewing, grounding and mindfulness techniques, psychoeducation  Summary: Dawn Jordan is a 52 y.o. female who presents with PTSD and ADHD Combined Presentation  Suicidal/Homicidal: No - without intent/plan  Therapist Response: Dawn Jordan met with clinician for an individual session. Dawn Jordan discussed her psychiatric symptoms, her current life events and her homework. Dawn Jordan reports she has been doing okay, but having some difficulty with pain since her medications have changed. Clinician explored medications and noted side effects and possible impact on sleep. Clinician discussed interactions with family members and noted decrease in paranoia. Clinician discussed Dawn Jordan's ADHD sxs and noted the tendency to "ping" and bounce from room to room, task to task, especially while cleaning. Clinician identified the value of taking breaks and being able to slow herself down so as not to burn out.   Plan: Return again in 3-4 weeks.  Diagnosis:     Axis I: PTSD and ADHD Combined Presentation    [name], LCSW 07/12/2018

## 2018-07-28 ENCOUNTER — Ambulatory Visit (HOSPITAL_COMMUNITY): Payer: Medicare Other | Admitting: Psychiatry
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
## 2018-07-31 ENCOUNTER — Other Ambulatory Visit: Payer: Self-pay | Admitting: Allergy & Immunology

## 2018-08-09 ENCOUNTER — Encounter (HOSPITAL_COMMUNITY): Payer: Self-pay | Admitting: Licensed Clinical Social Worker

## 2018-08-09 ENCOUNTER — Ambulatory Visit (INDEPENDENT_AMBULATORY_CARE_PROVIDER_SITE_OTHER): Payer: Medicare Other | Admitting: Licensed Clinical Social Worker

## 2018-08-09 DIAGNOSIS — F4310 Post-traumatic stress disorder, unspecified: Secondary | ICD-10-CM

## 2018-08-09 DIAGNOSIS — F902 Attention-deficit hyperactivity disorder, combined type: Secondary | ICD-10-CM | POA: Diagnosis not present

## 2018-08-09 NOTE — Progress Notes (Signed)
   THERAPIST PROGRESS NOTE  Session Time: 9:05am-10:00am  Participation Level: Active  Behavioral Response: NeatAlertEuthymic  Type of Therapy: Individual Therapy  Treatment Goals addressed: "to get the ADHD under control and process trauma"  Interventions: CBT, Motivational Interviewing, grounding and mindfulness techniques, psychoeducation  Summary: Dawn Jordan is a 52 y.o. female who presents with PTSD and ADHD Combined Presentation  Suicidal/Homicidal: No - without intent/plan  Therapist Response: [name] met with clinician for an individual session. [name] discussed her psychiatric symptoms, her current life events and her homework. [name] processed relationship and concerns about her partner's job. Clinician explored her concerns and identified several issues that [name] has no control over, yet she tends to obsess and worry. Clinician processed ways for [name] to manage her stress and anxiety in relation to her partner. Clinician identified ongoing "helping" behaviors which may backfire. [name] noted that this was part of her personality, and it would not likely change much. [name] processed her relationship with partner and provided some details into their history.   Plan: Return again in 2 weeks.  Diagnosis:     Axis I: PTSD and ADHD Combined Presentation  [name], LCSW 08/09/2018

## 2018-08-11 ENCOUNTER — Encounter (HOSPITAL_COMMUNITY): Payer: Self-pay | Admitting: Psychiatry

## 2018-08-11 ENCOUNTER — Ambulatory Visit (INDEPENDENT_AMBULATORY_CARE_PROVIDER_SITE_OTHER): Payer: Medicare Other | Admitting: Psychiatry

## 2018-08-11 VITALS — BP 132/80 | HR 88 | Ht 64.0 in | Wt 236.0 lb

## 2018-08-11 DIAGNOSIS — Z658 Other specified problems related to psychosocial circumstances: Secondary | ICD-10-CM | POA: Diagnosis not present

## 2018-08-11 DIAGNOSIS — F900 Attention-deficit hyperactivity disorder, predominantly inattentive type: Secondary | ICD-10-CM | POA: Diagnosis not present

## 2018-08-11 DIAGNOSIS — R45851 Suicidal ideations: Secondary | ICD-10-CM

## 2018-08-11 DIAGNOSIS — Z736 Limitation of activities due to disability: Secondary | ICD-10-CM | POA: Diagnosis not present

## 2018-08-11 DIAGNOSIS — F4310 Post-traumatic stress disorder, unspecified: Secondary | ICD-10-CM

## 2018-08-11 DIAGNOSIS — F331 Major depressive disorder, recurrent, moderate: Secondary | ICD-10-CM

## 2018-08-11 DIAGNOSIS — Z62810 Personal history of physical and sexual abuse in childhood: Secondary | ICD-10-CM

## 2018-08-11 MED ORDER — LITHIUM CARBONATE ER 300 MG PO TBCR: 300.0000 mg | EXTENDED_RELEASE_TABLET | Freq: Two times a day (BID) | ORAL | 1 refills | Status: DC

## 2018-08-11 MED ORDER — METHYLPHENIDATE HCL 10 MG PO TABS: 10.0000 mg | ORAL_TABLET | Freq: Two times a day (BID) | ORAL | 0 refills | Status: DC

## 2018-08-11 MED ORDER — CLONAZEPAM 0.5 MG PO TABS: 0.2500 mg | ORAL_TABLET | Freq: Two times a day (BID) | ORAL | 1 refills | Status: DC | PRN
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
## 2018-08-12 ENCOUNTER — Other Ambulatory Visit: Payer: Self-pay | Admitting: Family Medicine

## 2018-08-12 DIAGNOSIS — M5416 Radiculopathy, lumbar region: Secondary | ICD-10-CM | POA: Diagnosis not present

## 2018-08-12 DIAGNOSIS — M5412 Radiculopathy, cervical region: Secondary | ICD-10-CM | POA: Diagnosis not present

## 2018-08-12 DIAGNOSIS — M545 Low back pain: Secondary | ICD-10-CM | POA: Diagnosis not present

## 2018-08-12 DIAGNOSIS — M5136 Other intervertebral disc degeneration, lumbar region: Secondary | ICD-10-CM | POA: Diagnosis not present

## 2018-08-12 DIAGNOSIS — R1013 Epigastric pain: Secondary | ICD-10-CM

## 2018-08-15 DIAGNOSIS — M797 Fibromyalgia: Secondary | ICD-10-CM | POA: Diagnosis not present

## 2018-08-15 DIAGNOSIS — E669 Obesity, unspecified: Secondary | ICD-10-CM | POA: Diagnosis not present

## 2018-08-15 DIAGNOSIS — M0609 Rheumatoid arthritis without rheumatoid factor, multiple sites: Secondary | ICD-10-CM | POA: Diagnosis not present

## 2018-08-15 DIAGNOSIS — M2550 Pain in unspecified joint: Secondary | ICD-10-CM | POA: Diagnosis not present

## 2018-08-15 DIAGNOSIS — Z79899 Other long term (current) drug therapy: Secondary | ICD-10-CM | POA: Diagnosis not present

## 2018-08-15 DIAGNOSIS — Z6839 Body mass index (BMI) 39.0-39.9, adult: Secondary | ICD-10-CM | POA: Diagnosis not present

## 2018-08-23 ENCOUNTER — Ambulatory Visit (HOSPITAL_COMMUNITY): Payer: Self-pay | Admitting: Licensed Clinical Social Worker

## 2018-09-03 ENCOUNTER — Other Ambulatory Visit (HOSPITAL_COMMUNITY): Payer: Self-pay | Admitting: Psychiatry

## 2018-09-03 DIAGNOSIS — F331 Major depressive disorder, recurrent, moderate: Secondary | ICD-10-CM

## 2018-09-04 ENCOUNTER — Other Ambulatory Visit: Payer: Self-pay | Admitting: Allergy & Immunology

## 2018-09-06 ENCOUNTER — Ambulatory Visit (HOSPITAL_COMMUNITY): Payer: Self-pay | Admitting: Licensed Clinical Social Worker

## 2018-09-06 ENCOUNTER — Other Ambulatory Visit: Payer: Self-pay | Admitting: Allergy & Immunology

## 2018-09-22 ENCOUNTER — Ambulatory Visit (HOSPITAL_COMMUNITY): Payer: Self-pay | Admitting: Psychiatry

## 2018-09-26 ENCOUNTER — Other Ambulatory Visit: Payer: Self-pay | Admitting: Allergy & Immunology

## 2018-09-26 NOTE — Telephone Encounter (Signed)
Denied refill Symbicort.  Patient needs OV. Per chart, Dr. Ernst Bowler, Return in about 3 months (around 03/30/2018).

## 2018-10-04 ENCOUNTER — Encounter: Payer: Self-pay | Admitting: Adult Health

## 2018-10-04 ENCOUNTER — Ambulatory Visit (INDEPENDENT_AMBULATORY_CARE_PROVIDER_SITE_OTHER): Payer: Medicare Other | Admitting: Adult Health

## 2018-10-04 VITALS — BP 134/80 | Temp 98.3°F | Wt 240.0 lb

## 2018-10-04 DIAGNOSIS — Z20828 Contact with and (suspected) exposure to other viral communicable diseases: Secondary | ICD-10-CM

## 2018-10-04 LAB — POC INFLUENZA A&B (BINAX/QUICKVUE)
INFLUENZA B, POC: NEGATIVE
Influenza A, POC: NEGATIVE

## 2018-10-04 NOTE — Progress Notes (Signed)
Subjective:    Patient ID: Dawn Jordan, female    DOB: [birth year], 52 y.o.   MRN: [id number]  HPI 52 year old female who  has a past medical history of Anemia, Anginal pain ([place]), Anxiety, Asthma, Chronic fatigue syndrome, COPD (chronic obstructive pulmonary disease) ([place]), Depression, Fibromyalgia, GERD (gastroesophageal reflux disease), Headache, History of gout, History of kidney stones, Left sciatic nerve pain, OSA (obstructive sleep apnea), Rheumatoid arthritis ([place]), and Seasonal allergies.  She presents to the office today for an acute issue of sore throat and sinus pressure x 2 days.   She denies any fevers, nausea, or vomiting.   She has been exposed to the flu at home.     Review of Systems See HPI   Past Medical History:  Diagnosis Date  . Anemia   . Anginal pain ([place])   . Anxiety   . Asthma   . Chronic fatigue syndrome   . COPD (chronic obstructive pulmonary disease) ([place])   . Depression   . Fibromyalgia   . GERD (gastroesophageal reflux disease)   . Headache    "weekly-monthly" (06/04/2017)  . History of gout   . History of kidney stones   . Left sciatic nerve pain   . OSA (obstructive sleep apnea)    "getting ready to retest" (06/04/2017)  . Rheumatoid arthritis ([place])   . Seasonal allergies     Social History   Socioeconomic History  . Marital status: Legally Separated    Spouse name: Not on file  . Number of children: 0  . Years of education: Not on file  . Highest education level: Not on file  Occupational History  . Occupation: not employed-disabled  Social Needs  . Financial resource strain: Not on file  . Food insecurity:    Worry: Not on file    Inability: Not on file  . Transportation needs:    Medical: Not on file    Non-medical: Not on file  Tobacco Use  . Smoking status: Never Smoker  . Smokeless tobacco: Never Used  Substance and Sexual Activity  . Alcohol use: No  . Drug use: No  . Sexual activity: Never  Lifestyle  .  Physical activity:    Days per week: Not on file    Minutes per session: Not on file  . Stress: Not on file  Relationships  . Social connections:    Talks on phone: Not on file    Gets together: Not on file    Attends religious service: Not on file    Active member of club or organization: Not on file    Attends meetings of clubs or organizations: Not on file    Relationship status: Not on file  . Intimate partner violence:    Fear of current or ex partner: Not on file    Emotionally abused: Not on file    Physically abused: Not on file    Forced sexual activity: Not on file  Other Topics Concern  . Not on file  Social History Narrative   Born in [place], grew up in [place]where"   Unemployed, on disability since 1991 for anxiety,    Education: [employer] college   Single, no children, lives with her friend (used to live with her mother with stroke, now in ALF)       Past Surgical History:  Procedure Laterality Date  . CARPAL TUNNEL RELEASE Left   . CYST EXCISION     "little cysts taken off both hands and  left arm" (06/04/2017)  . KNEE ARTHROSCOPY Bilateral    "3 on my left; 2 on my right" (06/04/2017)  . LAPAROSCOPIC CHOLECYSTECTOMY    . TONSILLECTOMY      Family History  Problem Relation Age of Onset  . Arthritis Mother   . Hyperlipidemia Mother   . Hypertension Mother   . Stroke Mother   . Mental retardation Mother   . Diabetes Mother   . Allergic rhinitis Mother   . Diabetes Maternal Grandfather   . Hypertension Maternal Grandfather   . Diabetes Paternal Grandmother   . Hypertension Paternal Grandmother   . Cancer Paternal Grandmother        breast  . Allergic rhinitis Brother   . Colon cancer Paternal Aunt   . Lung cancer Other   . Lung cancer Maternal Uncle   . Asthma Neg Hx   . Eczema Neg Hx   . Immunodeficiency Neg Hx   . Urticaria Neg Hx   . Atopy Neg Hx   . Angioedema Neg Hx   . Esophageal cancer Neg Hx   . Stomach cancer Neg Hx   . Rectal  cancer Neg Hx     Allergies  Allergen Reactions  . Eggs Or Egg-Derived Products Diarrhea  . Latex Anaphylaxis, Hives and Itching    "Anaphylaxis is only when I am in a closed area (ex: car with balloons)"  . Other Other (See Comments)    Sinus headache from new plastics, carpets, etc.  . Codeine     Headaches     Current Outpatient Medications on File Prior to Visit  Medication Sig Dispense Refill  . albuterol (PROVENTIL HFA;VENTOLIN HFA) 108 (90 Base) MCG/ACT inhaler Inhale 2 puffs into the lungs every 6 (six) hours as needed for wheezing or shortness of breath. 1 Inhaler 0  . budesonide-formoterol (SYMBICORT) 160-4.5 MCG/ACT inhaler TAKE 2 PUFFS BY MOUTH TWICE A DAY 3 Inhaler 0  . clonazePAM (KLONOPIN) 0.5 MG tablet Take 0.5 tablets (0.25 mg total) by mouth 2 (two) times daily as needed for anxiety. 30 tablet 1  . diclofenac sodium (VOLTAREN) 1 % GEL Apply 1 application topically 2 (two) times daily as needed (back and knee pain).     . flunisolide (NASALIDE) 25 MCG/ACT (0.025%) SOLN Place 1 spray into the nose 2 (two) times daily. (Patient taking differently: Place 1 spray into the nose 2 (two) times daily as needed (congestion). ) 1 Bottle 5  . levalbuterol (XOPENEX) 1.25 MG/3ML nebulizer solution Take 1.25 mg by nebulization every 4 (four) hours as needed for wheezing. 75 mL 1  . levocetirizine (XYZAL) 5 MG tablet Take 1 tablet (5 mg total) by mouth every evening. 30 tablet 5  . lithium carbonate (LITHOBID) 300 MG CR tablet Take 1 tablet (300 mg total) by mouth 2 (two) times daily. 60 tablet 1  . Loperamide HCl (IMODIUM PO) Take by mouth as needed.    . methylphenidate (RITALIN) 10 MG tablet Take 1 tablet (10 mg total) by mouth 2 (two) times daily. 60 tablet 0  . montelukast (SINGULAIR) 10 MG tablet Take 1 tablet (10 mg total) by mouth at bedtime. 30 tablet 5  . nabumetone (RELAFEN) 500 MG tablet Take 500 mg by mouth 3 (three) times daily.  3  . nitroGLYCERIN (NITROSTAT) 0.4 MG SL  tablet Place 1 tablet (0.4 mg total) under the tongue every 5 (five) minutes as needed for chest pain. 30 tablet 12  . omeprazole (PRILOSEC) 40 MG capsule TAKE 1 CAPSULE EVERY  DAY BEFORE BREAKFAST 30 capsule 0  . pregabalin (LYRICA) 200 MG capsule Take 200 mg by mouth 3 (three) times daily.    . Tiotropium Bromide Monohydrate (SPIRIVA RESPIMAT) 1.25 MCG/ACT AERS Inhale 2 puffs into the lungs daily. 4 g 5  . methylphenidate (RITALIN) 10 MG tablet Take 1 tablet (10 mg total) by mouth 2 (two) times daily. 60 tablet 0   Current Facility-Administered Medications on File Prior to Visit  Medication Dose Route Frequency Provider Last Rate Last Dose  . 0.9 %  sodium chloride infusion  500 mL Intravenous Continuous Milus Banister, MD        BP 134/80   Temp 98.3 F (36.8 C) (Oral)   Wt 240 lb (108.9 kg)   LMP  (LMP Unknown)   BMI 41.20 kg/m       Objective:   Physical Exam  Constitutional: She is oriented to person, place, and time. She appears well-developed and well-nourished. No distress.  HENT:  Head: Normocephalic and atraumatic.  Right Ear: External ear normal.  Left Ear: External ear normal.  Nose: Nose normal.  Mouth/Throat: Oropharynx is clear and moist. No oropharyngeal exudate.  Eyes: Pupils are equal, round, and reactive to light. Conjunctivae and EOM are normal. Right eye exhibits no discharge. Left eye exhibits no discharge. No scleral icterus.  Neck: Normal range of motion. Neck supple.  Cardiovascular: Normal rate, regular rhythm, normal heart sounds and intact distal pulses.  Pulmonary/Chest: Effort normal and breath sounds normal.  Lymphadenopathy:    She has no cervical adenopathy.  Neurological: She is alert and oriented to person, place, and time.  Skin: Skin is warm and dry. She is not diaphoretic.  Psychiatric: She has a normal mood and affect. Her behavior is normal. Judgment and thought content normal.  Nursing note and vitals reviewed.     Assessment &  Plan:  1. Exposure to the flu - benign exam  - POC Influenza A&B(BINAX/QUICKVUE)- negative  - Follow up if not improving in the next 2-3 days or sooner if fever develops   Dorothyann Peng, NP  -

## 2018-10-06 DIAGNOSIS — Z79899 Other long term (current) drug therapy: Secondary | ICD-10-CM | POA: Diagnosis not present

## 2018-10-06 DIAGNOSIS — M797 Fibromyalgia: Secondary | ICD-10-CM | POA: Diagnosis not present

## 2018-10-06 DIAGNOSIS — Z6839 Body mass index (BMI) 39.0-39.9, adult: Secondary | ICD-10-CM | POA: Diagnosis not present

## 2018-10-06 DIAGNOSIS — M0609 Rheumatoid arthritis without rheumatoid factor, multiple sites: Secondary | ICD-10-CM | POA: Diagnosis not present

## 2018-10-06 DIAGNOSIS — M2550 Pain in unspecified joint: Secondary | ICD-10-CM | POA: Diagnosis not present

## 2018-10-06 DIAGNOSIS — E669 Obesity, unspecified: Secondary | ICD-10-CM | POA: Diagnosis not present

## 2018-10-07 DIAGNOSIS — M542 Cervicalgia: Secondary | ICD-10-CM | POA: Diagnosis not present

## 2018-10-07 DIAGNOSIS — M5412 Radiculopathy, cervical region: Secondary | ICD-10-CM | POA: Diagnosis not present

## 2018-10-07 DIAGNOSIS — M5417 Radiculopathy, lumbosacral region: Secondary | ICD-10-CM | POA: Diagnosis not present

## 2018-10-07 DIAGNOSIS — M4723 Other spondylosis with radiculopathy, cervicothoracic region: Secondary | ICD-10-CM | POA: Diagnosis not present

## 2018-10-13 ENCOUNTER — Ambulatory Visit (HOSPITAL_COMMUNITY): Payer: Medicare Other | Admitting: Licensed Clinical Social Worker

## 2018-10-21 ENCOUNTER — Other Ambulatory Visit: Payer: Self-pay | Admitting: Family Medicine

## 2018-10-21 ENCOUNTER — Ambulatory Visit (INDEPENDENT_AMBULATORY_CARE_PROVIDER_SITE_OTHER): Payer: Medicare Other | Admitting: Family Medicine

## 2018-10-21 ENCOUNTER — Encounter: Payer: Self-pay | Admitting: Family Medicine

## 2018-10-21 VITALS — BP 138/74 | HR 92 | Resp 20 | Ht 63.5 in | Wt 242.0 lb

## 2018-10-21 DIAGNOSIS — J3089 Other allergic rhinitis: Secondary | ICD-10-CM | POA: Diagnosis not present

## 2018-10-21 DIAGNOSIS — J302 Other seasonal allergic rhinitis: Secondary | ICD-10-CM | POA: Insufficient documentation

## 2018-10-21 DIAGNOSIS — L230 Allergic contact dermatitis due to metals: Secondary | ICD-10-CM | POA: Diagnosis not present

## 2018-10-21 DIAGNOSIS — J45901 Unspecified asthma with (acute) exacerbation: Secondary | ICD-10-CM | POA: Diagnosis not present

## 2018-10-21 DIAGNOSIS — J4541 Moderate persistent asthma with (acute) exacerbation: Secondary | ICD-10-CM

## 2018-10-21 DIAGNOSIS — T63421A Toxic effect of venom of ants, accidental (unintentional), initial encounter: Secondary | ICD-10-CM | POA: Insufficient documentation

## 2018-10-21 DIAGNOSIS — T63421D Toxic effect of venom of ants, accidental (unintentional), subsequent encounter: Secondary | ICD-10-CM | POA: Diagnosis not present

## 2018-10-21 DIAGNOSIS — Z91038 Other insect allergy status: Secondary | ICD-10-CM | POA: Diagnosis not present

## 2018-10-21 MED ORDER — ALBUTEROL SULFATE HFA 108 (90 BASE) MCG/ACT IN AERS: 2.0000 | INHALATION_SPRAY | Freq: Four times a day (QID) | RESPIRATORY_TRACT | 0 refills | Status: DC | PRN

## 2018-10-21 MED ORDER — BUDESONIDE-FORMOTEROL FUMARATE 160-4.5 MCG/ACT IN AERO: INHALATION_SPRAY | RESPIRATORY_TRACT | 0 refills | Status: DC

## 2018-10-21 MED ORDER — LEVALBUTEROL HCL 1.25 MG/3ML IN NEBU: 1.2500 mg | INHALATION_SOLUTION | RESPIRATORY_TRACT | 1 refills | Status: DC | PRN

## 2018-10-21 MED ORDER — EPINEPHRINE 0.3 MG/0.3ML IJ SOAJ: INTRAMUSCULAR | 6 refills | Status: DC

## 2018-10-21 MED ORDER — TIOTROPIUM BROMIDE MONOHYDRATE 1.25 MCG/ACT IN AERS: 2.0000 | INHALATION_SPRAY | Freq: Every day | RESPIRATORY_TRACT | 5 refills | Status: DC

## 2018-10-21 MED ORDER — LEVOCETIRIZINE DIHYDROCHLORIDE 5 MG PO TABS: 5.0000 mg | ORAL_TABLET | Freq: Every evening | ORAL | 5 refills | Status: DC

## 2018-10-21 MED ORDER — MONTELUKAST SODIUM 10 MG PO TABS: 10.0000 mg | ORAL_TABLET | Freq: Every day | ORAL | 5 refills | Status: DC

## 2018-10-21 NOTE — Progress Notes (Addendum)
[facility] [employer] [place] [id number] Dept: [phone]  FOLLOW UP NOTE  Patient ID: Dawn Jordan, female    DOB: Feb 20, 1966  Age: 52 y.o. MRN: [id number] Date of Office Visit: 10/21/2018  Assessment  Chief Complaint: Asthma (worsening now that the cold weather has set in. she has not had any albuterol because of needing a OV. ) and Eczema (has been doing well. )  HPI Dawn Jordan is a 52 year old female who presents to the clinic for a follow up visit. She reports her asthma has not been well controlled since she had new furniture delivered to her home 7 days ago. She reports symptoms including shortness of breath, cough, and wheezing while she is in the home with the new furniture. She reports that she is using Symbicort 160-2 puffs twice a day, Spiriva, montelukast, and albuterol via inhaler about 1-2 times a week with relief of symptoms.  She reports her eczema has been well controlled. Allergic rhinitis is reported as well controlled with Xyzal once a day and Flonase in the spring season. She uses Pataday as needed for red and itchy eyes. She continues to avoid nickle, chromium, and copper. She reports a bee sting as a child with localized swelling and possible shortness of breath as well as a fire ant bite with a large local reaction that may have become infected at some point. She continues to avoid laytex and reports her EpiPen has long since expired. Her current medications are listed in the chart.   Drug Allergies:  Allergies  Allergen Reactions  . Eggs Or Egg-Derived Products Diarrhea  . Latex Anaphylaxis, Hives and Itching    "Anaphylaxis is only when I am in a closed area (ex: car with balloons)"  . Other Other (See Comments)    Sinus headache from new plastics, carpets, etc.  . Codeine     Headaches     Physical Exam: BP 138/74 (BP Location: Left Arm, Patient Position: Sitting, Cuff Size: Large)   Pulse 92   Resp 20   Ht 5' 3.5" (1.613 m)   Wt 242 lb (109.8 kg)    LMP  (LMP Unknown)   SpO2 98%   BMI 42.20 kg/m    Physical Exam  Constitutional: She is oriented to person, place, and time. She appears well-developed and well-nourished.  HENT:  Head: Normocephalic.  Right Ear: External ear normal.  Left Ear: External ear normal.  Mouth/Throat: Oropharynx is clear and moist.  Bilateral nares slightly erythematous. Pharynx normal. Ears normal. Eyes normal.   Eyes: Conjunctivae are normal.  Neck: Normal range of motion. Neck supple.  Cardiovascular: Normal rate, regular rhythm and normal heart sounds.  No murmur noted  Pulmonary/Chest: Effort normal and breath sounds normal.  Bilateral scattered wheezes which improved after bronchodilator therapy  Musculoskeletal: Normal range of motion.  Neurological: She is alert and oriented to person, place, and time.  Skin: Skin is warm and dry.  Psychiatric: She has a normal mood and affect. Her behavior is normal. Judgment and thought content normal.  Vitals reviewed.   Diagnostics: FVC 2.79, FEV1 2.35. Predicted FVC 3.52, predicted FEV1 2.35. Spirometry indicates mild restriction. Post bronchodilator therapy FVC 2.82, FEV1 2.37. Post bronchodilator therapy FVC 2.82, FEV1 2.37. No significant improvement noted.   Assessment and Plan: 1. Moderate persistent asthma with acute exacerbation   2. Seasonal and perennial allergic rhinitis   3. History of insect sting allergy   4. Fire ant bite, accidental or unintentional, subsequent encounter  5. Allergic contact dermatitis due to metals   6. Moderate asthma with acute exacerbation, unspecified whether persistent     Meds ordered this encounter  Medications  . budesonide-formoterol (SYMBICORT) 160-4.5 MCG/ACT inhaler    Sig: TAKE 2 PUFFS BY MOUTH TWICE A DAY    Dispense:  3 Inhaler    Refill:  0    90 DAY SUPPLY WITH NO REFILLS  . levocetirizine (XYZAL) 5 MG tablet    Sig: Take 1 tablet (5 mg total) by mouth every evening.    Dispense:  30 tablet     Refill:  5  . levalbuterol (XOPENEX) 1.25 MG/3ML nebulizer solution    Sig: Take 1.25 mg by nebulization every 4 (four) hours as needed for wheezing.    Dispense:  75 mL    Refill:  1    J45.41  . montelukast (SINGULAIR) 10 MG tablet    Sig: Take 1 tablet (10 mg total) by mouth at bedtime.    Dispense:  30 tablet    Refill:  5  . Tiotropium Bromide Monohydrate (SPIRIVA RESPIMAT) 1.25 MCG/ACT AERS    Sig: Inhale 2 puffs into the lungs daily.    Dispense:  4 g    Refill:  5  . EPINEPHrine (EPIPEN 2-PAK) 0.3 mg/0.3 mL IJ SOAJ injection    Sig: Use for life threatening allergic reactions    Dispense:  2 Device    Refill:  6  . albuterol (PROVENTIL HFA;VENTOLIN HFA) 108 (90 Base) MCG/ACT inhaler    Sig: Inhale 2 puffs into the lungs every 6 (six) hours as needed for wheezing or shortness of breath.    Dispense:  1 Inhaler    Refill:  0    Patient Instructions  1. Moderate persistent asthma    - Daily controller medication(s): Singulair 10mg  daily, Symbicort 160/4.71mcg two puffs twice daily with spacer and Spiriva 1.54mcg two puffs once daily  - Prior to physical activity: ProAir 2 puffs 10-15 minutes before physical activity. - Rescue medications: ProAir 4 puffs every 4-6 hours as needed - Asthma control goals:  * Full participation in all desired activities (may need albuterol before activity) * Albuterol use two time or less a week on average (not counting use with activity) * Cough interfering with sleep two time or less a month * Oral steroids no more than once a year * No hospitalizations  2. Seasonal and perennial allergic rhinitis (indoor molds, outdoor molds, cockroach, dust mites) - Continue with Singulair 10mg  daily. - Continue with Xyzal 5mg  daily. - Consider nasal saline rinses. - Add Pataday one drop per eye twice daily as needed.   3. History of allergy to stinging insect and fire ant bite - We will collect labs today. We will call you with the results as soon as  they are available. - Avoid stinging insects and fire ants. In case of an allergic reaction, take Benadryl 50 mg every 4 hours, and if life-threatening symptoms occur, inject with EpiPen 0.3 mg.  4. Allergy to metal - Continue to avoid triggering metals (nickel, chromium, copper sulfate)  5. Follow up in 2 months or sooner if needed   Return in about 2 months (around 12/21/2018), or if symptoms worsen or fail to improve.    Thank you for the opportunity to care for this patient.  Please do not hesitate to contact me with questions.  Gareth Morgan, FNP [facility]

## 2018-10-21 NOTE — Patient Instructions (Addendum)
1. Moderate persistent asthma    - Daily controller medication(s): Singulair 10mg  daily, Symbicort 160/4.91mcg two puffs twice daily with spacer and Spiriva 1.95mcg two puffs once daily  - Prior to physical activity: ProAir 2 puffs 10-15 minutes before physical activity. - Rescue medications: ProAir 4 puffs every 4-6 hours as needed - Asthma control goals:  * Full participation in all desired activities (may need albuterol before activity) * Albuterol use two time or less a week on average (not counting use with activity) * Cough interfering with sleep two time or less a month * Oral steroids no more than once a year * No hospitalizations  2. Seasonal and perennial allergic rhinitis (indoor molds, outdoor molds, cockroach, dust mites) - Continue with Singulair 10mg  daily. - Continue with Xyzal 5mg  daily. - Consider nasal saline rinses. - Add Pataday one drop per eye twice daily as needed.   3. History of allergy to stinging insect and fire ant bite - We will collect labs today. We will call you with the results as soon as they are available. - Avoid stinging insects and fire ants. In case of an allergic reaction, take Benadryl 50 mg every 4 hours, and if life-threatening symptoms occur, inject with EpiPen 0.3 mg.  4. Allergy to metal - Continue to avoid triggering metals (nickel, chromium, copper sulfate)  5. Follow up in 2 months or sooner if needed  Please inform us of any Emergency Department visits, hospitalizations, or changes in symptoms. Call us before going to the ED for breathing or allergy symptoms since we might be able to fit you in for a sick visit. Feel free to contact us anytime with any questions, problems, or concerns.  It was a pleasure to meet you today!   Websites that have reliable patient information: 1. American Academy of Asthma, Allergy, and Immunology: www.aaaai.org 2. Food Allergy Research and Education (FARE): foodallergy.org 3. Mothers of Asthmatics:  http://www.asthmacommunitynetwork.org 4. American College of Allergy, Asthma, and Immunology: www.acaai.org

## 2018-10-24 ENCOUNTER — Telehealth: Payer: Self-pay | Admitting: Family Medicine

## 2018-10-24 LAB — ALLERGEN HYMENOPTERA PANEL
Bumblebee: <0.1 kU/L
Honeybee IgE: <0.1 kU/L
Hornet, Yellow, IgE: <0.1 kU/L

## 2018-10-24 LAB — ALLERGEN FIRE ANT

## 2018-10-24 NOTE — Telephone Encounter (Signed)
Left message for her to call me for clarification about her albuterol inhaler. Last fill date I can see in 2018.

## 2018-10-25 ENCOUNTER — Other Ambulatory Visit (HOSPITAL_COMMUNITY): Payer: Self-pay | Admitting: Psychiatry

## 2018-10-25 DIAGNOSIS — M62830 Muscle spasm of back: Secondary | ICD-10-CM | POA: Diagnosis not present

## 2018-10-25 DIAGNOSIS — M545 Low back pain: Secondary | ICD-10-CM | POA: Diagnosis not present

## 2018-10-25 DIAGNOSIS — M5136 Other intervertebral disc degeneration, lumbar region: Secondary | ICD-10-CM | POA: Diagnosis not present

## 2018-10-25 DIAGNOSIS — F331 Major depressive disorder, recurrent, moderate: Secondary | ICD-10-CM

## 2018-10-25 DIAGNOSIS — M5030 Other cervical disc degeneration, unspecified cervical region: Secondary | ICD-10-CM | POA: Diagnosis not present

## 2018-10-27 ENCOUNTER — Ambulatory Visit (INDEPENDENT_AMBULATORY_CARE_PROVIDER_SITE_OTHER): Payer: Medicare Other | Admitting: Licensed Clinical Social Worker

## 2018-10-27 ENCOUNTER — Encounter (HOSPITAL_COMMUNITY): Payer: Self-pay | Admitting: Licensed Clinical Social Worker

## 2018-10-27 DIAGNOSIS — F331 Major depressive disorder, recurrent, moderate: Secondary | ICD-10-CM

## 2018-10-27 DIAGNOSIS — F4310 Post-traumatic stress disorder, unspecified: Secondary | ICD-10-CM

## 2018-10-27 DIAGNOSIS — F900 Attention-deficit hyperactivity disorder, predominantly inattentive type: Secondary | ICD-10-CM | POA: Diagnosis not present

## 2018-10-27 NOTE — Progress Notes (Signed)
Can you please call this patient and let her know her stinging insect panel and her fire ant panel were both negative. She should still carry her EpiPen at all times. Thank you

## 2018-10-27 NOTE — Progress Notes (Signed)
   THERAPIST PROGRESS NOTE  Session Time: 3:30pm-4:30pm  Participation Level: Active  Behavioral Response: Well GroomedAlertAnxious  Type of Therapy: Individual Therapy  Treatment Goals addressed: "to get the ADHD under control and process trauma"  Interventions: CBT, Motivational Interviewing, grounding and mindfulness techniques, psychoeducation  Summary: Dawn Jordan is a 52 y.o. female who presents with PTSD and ADHD Combined Presentation  Suicidal/Homicidal: No - without intent/plan  Therapist Response: Mariko met with clinician for an individual session. Dawn Jordan discussed her psychiatric symptoms, her current life events and her homework. Dawn Jordan reports she has been having a lot of pain due to problems with a recent injection in her spine. She reported that when they sedated her, she was not all the way under when they did the injections. She reported ongoing pain across her hip and sacrum due to this. Dawn Jordan identified concerns about mom, who is starting to show signs of dementia. Dawn Jordan spent much of the session processing her concerns and noted that she does not really know what the truth is, because mom sometimes plays up her issues more than necessary. Clinician offered supportive counseling and identified options for having another CNA or worker to assess mom's functioning.   Plan: Return again in 3-4 weeks.  Diagnosis:     Axis I: PTSD and ADHD Combined Presentation [name], LCSW 10/27/2018

## 2018-10-30 ENCOUNTER — Encounter (HOSPITAL_COMMUNITY): Payer: Self-pay | Admitting: Emergency Medicine

## 2018-10-30 ENCOUNTER — Emergency Department (HOSPITAL_COMMUNITY): Payer: Medicare Other

## 2018-10-30 ENCOUNTER — Emergency Department (HOSPITAL_COMMUNITY)
Admission: EM | Admit: 2018-10-30 | Discharge: 2018-10-30 | Disposition: A | Discharge status: Home / Self Care | Payer: Medicare Other | Attending: Emergency Medicine | Admitting: Emergency Medicine

## 2018-10-30 DIAGNOSIS — F909 Attention-deficit hyperactivity disorder, unspecified type: Secondary | ICD-10-CM | POA: Insufficient documentation

## 2018-10-30 DIAGNOSIS — Y999 Unspecified external cause status: Secondary | ICD-10-CM | POA: Diagnosis not present

## 2018-10-30 DIAGNOSIS — Z91040 Latex allergy status: Secondary | ICD-10-CM | POA: Diagnosis not present

## 2018-10-30 DIAGNOSIS — M542 Cervicalgia: Secondary | ICD-10-CM | POA: Diagnosis not present

## 2018-10-30 DIAGNOSIS — Y93E1 Activity, personal bathing and showering: Secondary | ICD-10-CM | POA: Insufficient documentation

## 2018-10-30 DIAGNOSIS — W182XXA Fall in (into) shower or empty bathtub, initial encounter: Secondary | ICD-10-CM | POA: Diagnosis not present

## 2018-10-30 DIAGNOSIS — J449 Chronic obstructive pulmonary disease, unspecified: Secondary | ICD-10-CM | POA: Insufficient documentation

## 2018-10-30 DIAGNOSIS — Y92002 Bathroom of unspecified non-institutional (private) residence single-family (private) house as the place of occurrence of the external cause: Secondary | ICD-10-CM | POA: Diagnosis not present

## 2018-10-30 DIAGNOSIS — Z79899 Other long term (current) drug therapy: Secondary | ICD-10-CM | POA: Diagnosis not present

## 2018-10-30 DIAGNOSIS — S0990XA Unspecified injury of head, initial encounter: Secondary | ICD-10-CM | POA: Diagnosis not present

## 2018-10-30 DIAGNOSIS — R51 Headache: Secondary | ICD-10-CM | POA: Diagnosis not present

## 2018-10-30 MED ORDER — ACETAMINOPHEN 500 MG PO TABS: 500.0000 mg | ORAL_TABLET | Freq: Four times a day (QID) | ORAL | 0 refills | Status: DC | PRN

## 2018-10-30 MED ORDER — OXYCODONE-ACETAMINOPHEN 5-325 MG PO TABS
1.0000 | ORAL_TABLET | Freq: Once | ORAL | Status: AC
  Administered 2018-10-30: 1 via ORAL
  Filled 2018-10-30: qty 1

## 2018-10-30 MED ORDER — CYCLOBENZAPRINE HCL 10 MG PO TABS: 10.0000 mg | ORAL_TABLET | Freq: Two times a day (BID) | ORAL | 0 refills | Status: DC | PRN

## 2018-10-30 NOTE — ED Triage Notes (Signed)
Pt presents after a fall in the shower, hit her head on the tile wall. Denies LOC, no blood thinner use. A&O x 4, ambulatory without assistance.

## 2018-10-30 NOTE — ED Provider Notes (Signed)
[facility] DEPART[facility] Provider Note   CSN: [id number] Arrival date & time: 10/30/18  1528     History   Chief Complaint Chief Complaint  Patient presents with  . Fall    HPI Dawn Jordan is a 52 y.o. female.  The history is provided by the patient. No language interpreter was used.  Fall      52 year old female with history of anemia, COPD, fibromyalgia, recurrent headache presenting for evaluation of a recent fall.  Patient reports she fell while she was in the shower prior to arrival.  States that she was sitting on a shower chair, when she stood up, right leg slipped, causing her to fall backward, striking her head against the hard surface.  She report acute onset of sharp throbbing pain to the back of her head and neck, moderate to severe without any loss of consciousness.  She denies any vision changes, confusion, nausea, vomiting, focal numbness or weakness.  She denies any other injury.  No specific treatment tried.  She was able to ambulate afterward with assistance.  No precipitating symptoms prior to the fall.  No chest pain, shortness of breath, abdominal pain, lower back pain or pain to her extremities.  Past Medical History:  Diagnosis Date  . Anemia   . Anginal pain ([place])   . Anxiety   . Asthma   . Chronic fatigue syndrome   . COPD (chronic obstructive pulmonary disease) ([place])   . Depression   . Fibromyalgia   . GERD (gastroesophageal reflux disease)   . Headache    "weekly-monthly" (06/04/2017)  . History of gout   . History of kidney stones   . Left sciatic nerve pain   . OSA (obstructive sleep apnea)    "getting ready to retest" (06/04/2017)  . Rheumatoid arthritis ([place])   . Seasonal allergies     Patient Active Problem List   Diagnosis Date Noted  . History of insect sting allergy 10/21/2018  . Fire ant bite 10/21/2018  . Seasonal and perennial allergic rhinitis 10/21/2018  . Allergic contact dermatitis due to metals  10/21/2018  . Asthma 06/04/2017  . Musculoskeletal chest pain 06/04/2017  . Special screening for malignant neoplasms, colon 04/06/2017  . Gastroesophageal reflux disease 04/06/2017  . Abdominal pain, epigastric 04/06/2017  . Chronic diarrhea 04/06/2017  . OSA (obstructive sleep apnea) 03/30/2017  . Major depressive disorder, single episode, moderate ([place]) 03/09/2017  . Moderate persistent asthma 01/28/2017  . History of food allergy 01/28/2017  . Dermatitis, contact 01/28/2017  . Allergic rhinitis 12/22/2016  . Fibromyalgia syndrome 09/21/2016  . Attention deficit hyperactivity disorder (ADHD) 09/21/2016  . PTSD (post-traumatic stress disorder) 09/21/2016  . BMI 37.0-37.9, adult   . Chronic rheumatic arthritis ([place]) 12/07/2014    Past Surgical History:  Procedure Laterality Date  . CARPAL TUNNEL RELEASE Left   . CYST EXCISION     "little cysts taken off both hands and left arm" (06/04/2017)  . KNEE ARTHROSCOPY Bilateral    "3 on my left; 2 on my right" (06/04/2017)  . LAPAROSCOPIC CHOLECYSTECTOMY    . TONSILLECTOMY       OB History   None      Home Medications    Prior to Admission medications   Medication Sig Start Date End Date Taking? Authorizing Provider  ACTEMRA 162 MG/0.9ML SOSY  10/17/18   [provider]  albuterol (PROVENTIL HFA;VENTOLIN HFA) 108 (90 Base) MCG/ACT inhaler Inhale 2 puffs into the lungs every 6 (six)  hours as needed for wheezing or shortness of breath. 10/21/18   Dara Hoyer, FNP  albuterol (PROVENTIL) (2.5 MG/3ML) 0.083% nebulizer solution Please specify directions, refills and quantity 10/24/18   Ambs, Kathrine Cords, FNP  budesonide-formoterol (SYMBICORT) 160-4.5 MCG/ACT inhaler TAKE 2 PUFFS BY MOUTH TWICE A DAY 10/21/18   Ambs, Kathrine Cords, FNP  clonazePAM (KLONOPIN) 0.5 MG tablet Take 0.5 tablets (0.25 mg total) by mouth 2 (two) times daily as needed for anxiety. 08/11/18   Charlcie Cradle, MD  diclofenac sodium (VOLTAREN) 1 % GEL Apply 1  application topically 2 (two) times daily as needed (back and knee pain).     [provider]  EPINEPHrine (EPIPEN 2-PAK) 0.3 mg/0.3 mL IJ SOAJ injection Use for life threatening allergic reactions 10/21/18   Ambs, Kathrine Cords, FNP  flunisolide (NASALIDE) 25 MCG/ACT (0.025%) SOLN Place 1 spray into the nose 2 (two) times daily. Patient taking differently: Place 1 spray into the nose 2 (two) times daily as needed (congestion).  01/28/17   Bobbitt, Sedalia Muta, MD  levocetirizine (XYZAL) 5 MG tablet Take 1 tablet (5 mg total) by mouth every evening. 10/21/18   Dara Hoyer, FNP  lithium carbonate (LITHOBID) 300 MG CR tablet TAKE 1 TABLET BY MOUTH 2 TIMES DAILY. 10/27/18   Charlcie Cradle, MD  Loperamide HCl (IMODIUM PO) Take by mouth as needed.    [provider]  methylphenidate (RITALIN) 10 MG tablet Take 1 tablet (10 mg total) by mouth 2 (two) times daily. 08/11/18   Charlcie Cradle, MD  methylphenidate (RITALIN) 10 MG tablet Take 1 tablet (10 mg total) by mouth 2 (two) times daily. 08/11/18 09/10/18  Charlcie Cradle, MD  montelukast (SINGULAIR) 10 MG tablet Take 1 tablet (10 mg total) by mouth at bedtime. 10/21/18   Dara Hoyer, FNP  nabumetone (RELAFEN) 500 MG tablet Take 500 mg by mouth 3 (three) times daily. 07/09/18   [provider]  nitroGLYCERIN (NITROSTAT) 0.4 MG SL tablet Place 1 tablet (0.4 mg total) under the tongue every 5 (five) minutes as needed for chest pain. 12/07/14   Debbe Odea, MD  omeprazole (PRILOSEC) 40 MG capsule TAKE 1 CAPSULE EVERY DAY BEFORE BREAKFAST 08/12/18   [place], Betty G, MD  pregabalin (LYRICA) 200 MG capsule Take 200 mg by mouth 3 (three) times daily.    [provider]  SPIRIVA RESPIMAT 1.25 MCG/ACT AERS INHALE 2 PUFFS BY MOUTH INTO THE LUNGS DAILY 10/24/18   Ambs, Kathrine Cords, FNP    Family History Family History  Problem Relation Age of Onset  . Arthritis Mother   . Hyperlipidemia Mother   . Hypertension Mother   . Stroke Mother   .  Mental retardation Mother   . Diabetes Mother   . Allergic rhinitis Mother   . Diabetes Maternal Grandfather   . Hypertension Maternal Grandfather   . Diabetes Paternal Grandmother   . Hypertension Paternal Grandmother   . Cancer Paternal Grandmother        breast  . Allergic rhinitis Brother   . Colon cancer Paternal Aunt   . Lung cancer Other   . Lung cancer Maternal Uncle   . Asthma Neg Hx   . Eczema Neg Hx   . Immunodeficiency Neg Hx   . Urticaria Neg Hx   . Atopy Neg Hx   . Angioedema Neg Hx   . Esophageal cancer Neg Hx   . Stomach cancer Neg Hx   . Rectal cancer Neg Hx  Social History Social History   Tobacco Use  . Smoking status: Never Smoker  . Smokeless tobacco: Never Used  Substance Use Topics  . Alcohol use: No  . Drug use: No     Allergies   Eggs or egg-derived products; Latex; Other; and Codeine   Review of Systems Review of Systems  All other systems reviewed and are negative.    Physical Exam Updated Vital Signs BP 125/81 (BP Location: Right Arm)   Pulse 80   Temp 99.4 F (37.4 C) (Oral)   Resp 16   LMP  (LMP Unknown)   SpO2 100%   Physical Exam  Constitutional: She is oriented to person, place, and time. She appears well-developed and well-nourished. No distress.  Obese female nontoxic in appearance  HENT:  Head: Normocephalic and atraumatic.  Tenderness to occiput of scalp without any significant bruising or crepitus.  No hemotympanum, no septal hematoma, no malocclusion, no midface tenderness.  Eyes: Conjunctivae are normal.  Neck: Neck supple.  Mild tenderness along midline cervical spine without crepitus or step-off.  Cardiovascular: Normal rate and regular rhythm.  Pulmonary/Chest: Effort normal and breath sounds normal.  Abdominal: Soft.  Musculoskeletal:  Able to move all 4 extremities  Neurological: She is alert and oriented to person, place, and time. No cranial nerve deficit or sensory deficit. GCS eye subscore is 4.  GCS verbal subscore is 5. GCS motor subscore is 6.  Skin: No rash noted.  Psychiatric: She has a normal mood and affect.  Nursing note and vitals reviewed.    ED Treatments / Results  Labs (all labs ordered are listed, but only abnormal results are displayed) Labs Reviewed - No data to display  EKG None  Radiology Dg Cervical Spine Complete  Result Date: 10/30/2018 CLINICAL DATA:  52 y/o  F; fall in shower with neck pain. EXAM: CERVICAL SPINE - COMPLETE 4+ VIEW COMPARISON:  None. FINDINGS: Straightening of cervical lordosis with grade 1 anterolisthesis at C3-4 and C4-5. Prominent upper cervical facet arthrosis. Uncovertebral and facet hypertrophy results in bony foraminal encroachment at the left C3 through C7 levels and right C5-C7 levels. No prevertebral soft tissue thickening. Moderate loss of C5-6 and C6-7 loss of intervertebral disc space height. No loss of vertebral body height. IMPRESSION: 1. No acute fracture or dislocation identified. 2. Cervical spondylosis with disc space loss greatest at C5-C7 as well as upper cervical facet arthropathy with C3-4 and C4-5 grade 1 anterolisthesis. Electronically Signed   By: Kristine Garbe M.D.   On: 10/30/2018 17:40   Ct Head Wo Contrast  Result Date: 10/30/2018 CLINICAL DATA:  Patient slipped in the shower and fell. Right head injury with headache. EXAM: CT HEAD WITHOUT CONTRAST TECHNIQUE: Contiguous axial images were obtained from the base of the skull through the vertex without intravenous contrast. COMPARISON:  None. FINDINGS: Brain: There is no evidence of acute intracranial hemorrhage, mass lesion, brain edema or extra-axial fluid collection. The ventricles and subarachnoid spaces are appropriately sized for age. There is no CT evidence of acute cortical infarction. Vascular:  No hyperdense vessel identified. Skull: Negative for fracture or focal lesion. Sinuses/Orbits: There is a small right maxillary sinus mucous retention cyst.  The visualized paranasal sinuses and mastoid air cells are otherwise clear. No orbital abnormalities are seen. Other: There is focal soft tissue swelling in the right parietal scalp. IMPRESSION: Right parietal scalp injury. No evidence of calvarial fracture or acute intracranial findings. Electronically Signed   By: Caryl Comes.D.  On: 10/30/2018 17:10    Procedures Procedures (including critical care time)  Medications Ordered in ED Medications  oxyCODONE-acetaminophen (PERCOCET/ROXICET) 5-325 MG per tablet 1 tablet (1 tablet Oral Given 10/30/18 1601)     Initial Impression / Assessment and Plan / ED Course  I have reviewed the triage vital signs and the nursing notes.  Pertinent labs & imaging results that were available during my care of the patient were reviewed by me and considered in my medical decision making (see chart for details).     BP 125/81 (BP Location: Right Arm)   Pulse 80   Temp 99.4 F (37.4 C) (Oral)   Resp 16   LMP  (LMP Unknown)   SpO2 100%    Final Clinical Impressions(s) / ED Diagnoses   Final diagnoses:  Minor head injury, initial encounter    ED Discharge Orders         Ordered    acetaminophen (TYLENOL) 500 MG tablet  Every 6 hours PRN     10/30/18 1753    cyclobenzaprine (FLEXERIL) 10 MG tablet  2 times daily PRN     10/30/18 1753         4:00 PM Patient here with mechanical fall injuring the back of her head and neck.  No loss of consciousness.  X-ray of cervical spine as well as head CT scan ordered.  She is not on blood thinner medication.  5:48 PM Head CT scan shows soft tissue injury at the right parietal region but no other concerning feature.  X-ray of cervical spine without any acute fracture or dislocation.  I discussed the finding with patient.  Will provide symptomatic treatment.  Return precautions discussed.  Orthopedic referral given as needed.  Patient does not have any concussive symptoms at this time.   Domenic Moras,  PA-C 10/30/18 1753    Virgel Manifold, MD 10/31/18 [phone]

## 2018-10-30 NOTE — ED Notes (Signed)
Patient transported to CT 

## 2018-11-02 DIAGNOSIS — M25552 Pain in left hip: Secondary | ICD-10-CM | POA: Diagnosis not present

## 2018-11-02 DIAGNOSIS — M25561 Pain in right knee: Secondary | ICD-10-CM | POA: Diagnosis not present

## 2018-11-17 ENCOUNTER — Encounter (HOSPITAL_COMMUNITY): Payer: Self-pay | Admitting: Psychiatry

## 2018-11-17 ENCOUNTER — Ambulatory Visit (INDEPENDENT_AMBULATORY_CARE_PROVIDER_SITE_OTHER): Payer: Medicare Other | Admitting: Psychiatry

## 2018-11-17 DIAGNOSIS — F900 Attention-deficit hyperactivity disorder, predominantly inattentive type: Secondary | ICD-10-CM

## 2018-11-17 DIAGNOSIS — F331 Major depressive disorder, recurrent, moderate: Secondary | ICD-10-CM | POA: Diagnosis not present

## 2018-11-17 DIAGNOSIS — F4310 Post-traumatic stress disorder, unspecified: Secondary | ICD-10-CM

## 2018-11-17 MED ORDER — METHYLPHENIDATE HCL 20 MG PO TABS: 20.0000 mg | ORAL_TABLET | Freq: Two times a day (BID) | ORAL | 0 refills | Status: DC

## 2018-11-17 MED ORDER — LITHIUM CARBONATE ER 450 MG PO TBCR: 450.0000 mg | EXTENDED_RELEASE_TABLET | Freq: Every day | ORAL | 1 refills | Status: DC

## 2018-11-17 MED ORDER — CLONAZEPAM 0.5 MG PO TABS: 0.2500 mg | ORAL_TABLET | Freq: Two times a day (BID) | ORAL | 1 refills | Status: DC | PRN
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
## 2018-11-21 ENCOUNTER — Telehealth (HOSPITAL_COMMUNITY): Payer: Self-pay

## 2018-11-21 DIAGNOSIS — F900 Attention-deficit hyperactivity disorder, predominantly inattentive type: Secondary | ICD-10-CM

## 2018-11-21 DIAGNOSIS — F4310 Post-traumatic stress disorder, unspecified: Secondary | ICD-10-CM

## 2018-11-21 MED ORDER — CLONAZEPAM 0.5 MG PO TABS: 0.2500 mg | ORAL_TABLET | Freq: Two times a day (BID) | ORAL | 1 refills | Status: DC | PRN

## 2018-11-21 MED ORDER — METHYLPHENIDATE HCL 20 MG PO TABS: 20.0000 mg | ORAL_TABLET | Freq: Two times a day (BID) | ORAL | 0 refills | Status: DC

## 2018-11-21 NOTE — Telephone Encounter (Signed)
I sent Klonopin and Ritalin prescription to her pharmacy.  If transmission failed again then she should come and pick up the prescription from the office.

## 2018-11-21 NOTE — Telephone Encounter (Signed)
This is a patient of Dr. Doyne Keel, on 12/12 she sent prescriptions to the pharmacy for patient - Klonopin and Ritalin. The transmission on both of these failed, would you mind resending for the patient? Thank you

## 2018-11-24 ENCOUNTER — Ambulatory Visit (INDEPENDENT_AMBULATORY_CARE_PROVIDER_SITE_OTHER): Payer: Medicare Other | Admitting: Licensed Clinical Social Worker

## 2018-11-24 ENCOUNTER — Encounter (HOSPITAL_COMMUNITY): Payer: Self-pay | Admitting: Licensed Clinical Social Worker

## 2018-11-24 DIAGNOSIS — F4310 Post-traumatic stress disorder, unspecified: Secondary | ICD-10-CM | POA: Diagnosis not present

## 2018-11-24 DIAGNOSIS — F900 Attention-deficit hyperactivity disorder, predominantly inattentive type: Secondary | ICD-10-CM | POA: Diagnosis not present

## 2018-11-24 NOTE — Progress Notes (Signed)
   THERAPIST PROGRESS NOTE  Session Time: 3:30pm-4:30pm  Participation Level: Active  Behavioral Response: Well GroomedAlertAnxious  Type of Therapy: Individual Therapy  Treatment Goals addressed: "to get the ADHD under control and process trauma"  Interventions: CBT, Motivational Interviewing, grounding and mindfulness techniques, psychoeducation  Summary: Dawn Jordan is a 52 y.o. female who presents with PTSD and ADHD Combined Presentation  Suicidal/Homicidal: No - without intent/plan  Therapist Response: Dawn Jordan met with clinician for an individual session. Dawn Jordan discussed her psychiatric symptoms, her current life events and her homework. Dawn Jordan reports continuing to have high levels of paranoia and jealousy over her wife. She processed these experiences. Clinician utilized MI OARS to reflect thoughts and feelings. Clinician challenged jealous thoughts with exploring level of trust for wife. Clinician also identified that the more jealous she is, the more likely it will be that the relationship will demise. Dawn Jordan agreed and noted how easy it is for the negative thoughts to present. Clinician challenged her to catch, challenge and change thoughts to more positive one. Clinician also identified coping skills of taking a deep breath and using critical thinking skills.   Plan: Return again in 3-4 weeks.  Diagnosis:     Axis I: PTSD and ADHD Combined Presentation [name], LCSW 11/24/2018

## 2018-12-12 ENCOUNTER — Ambulatory Visit: Payer: Self-pay | Admitting: Family Medicine

## 2018-12-14 ENCOUNTER — Other Ambulatory Visit (HOSPITAL_COMMUNITY): Payer: Self-pay | Admitting: Psychiatry

## 2018-12-14 DIAGNOSIS — F331 Major depressive disorder, recurrent, moderate: Secondary | ICD-10-CM

## 2018-12-15 ENCOUNTER — Ambulatory Visit (HOSPITAL_COMMUNITY): Payer: Medicare Other | Admitting: Licensed Clinical Social Worker

## 2018-12-16 DIAGNOSIS — M1712 Unilateral primary osteoarthritis, left knee: Secondary | ICD-10-CM | POA: Diagnosis not present

## 2018-12-16 DIAGNOSIS — M1711 Unilateral primary osteoarthritis, right knee: Secondary | ICD-10-CM | POA: Diagnosis not present

## 2018-12-19 ENCOUNTER — Ambulatory Visit (HOSPITAL_COMMUNITY): Payer: Medicare Other | Admitting: Licensed Clinical Social Worker

## 2018-12-29 ENCOUNTER — Encounter (HOSPITAL_COMMUNITY): Payer: Self-pay | Admitting: Licensed Clinical Social Worker

## 2018-12-29 ENCOUNTER — Ambulatory Visit (INDEPENDENT_AMBULATORY_CARE_PROVIDER_SITE_OTHER): Payer: Medicare Other | Admitting: Licensed Clinical Social Worker

## 2018-12-29 DIAGNOSIS — F4310 Post-traumatic stress disorder, unspecified: Secondary | ICD-10-CM | POA: Diagnosis not present

## 2018-12-29 DIAGNOSIS — F900 Attention-deficit hyperactivity disorder, predominantly inattentive type: Secondary | ICD-10-CM | POA: Diagnosis not present

## 2018-12-29 NOTE — Progress Notes (Signed)
   THERAPIST PROGRESS NOTE  Session Time: 2:30pm-3:30pm  Participation Level: Active  Behavioral Response: NeatAlertEuthymic  Type of Therapy: Individual Therapy  Treatment Goals addressed: "to get the ADHD under control and process trauma"  Interventions: CBT, Motivational Interviewing, grounding and mindfulness techniques, psychoeducation  Summary: Dawn Jordan is a 53 y.o. female who presents with PTSD and ADHD Combined Presentation  Suicidal/Homicidal: No - without intent/plan  Therapist Response: Oda met with clinician for an individual session. Hadlyn discussed her psychiatric symptoms, her current life events and her homework. Jesenia discussed changes in insurance and processed her experience communicating with DSS and IT trainer. Clinician explored more into Madeeha's past experiences and processed traumatic events using TFCBT and MI. Ginna remained calm and stable in session and engaged well in recounting her history.  Plan: Return again in 3-4 weeks.  Diagnosis:     Axis I: PTSD and ADHD Combined Presentation   [name], LCSW 12/29/2018

## 2019-01-03 ENCOUNTER — Telehealth: Payer: Self-pay | Admitting: *Deleted

## 2019-01-03 NOTE — Telephone Encounter (Signed)
Patient called stating she is having sinus pressure and her ears are stopped up. Patient wants to know if something can be called in. Please advise

## 2019-01-03 NOTE — Telephone Encounter (Signed)
Called patient and she stated that she has been having nasal congestion and her ears have been stopped up and burning for 2 days. She has been using saline spray and Vicks Vapo-Rub underneath her nose at night and unfortunately her symptoms have not resolved. Please advise.

## 2019-01-04 ENCOUNTER — Encounter: Payer: Self-pay | Admitting: Family Medicine

## 2019-01-04 ENCOUNTER — Ambulatory Visit (INDEPENDENT_AMBULATORY_CARE_PROVIDER_SITE_OTHER): Payer: Medicare Other | Admitting: Family Medicine

## 2019-01-04 VITALS — BP 128/86 | HR 80 | Temp 98.5°F | Resp 16 | Ht 64.0 in | Wt 241.6 lb

## 2019-01-04 DIAGNOSIS — J4541 Moderate persistent asthma with (acute) exacerbation: Secondary | ICD-10-CM | POA: Diagnosis not present

## 2019-01-04 DIAGNOSIS — J302 Other seasonal allergic rhinitis: Secondary | ICD-10-CM | POA: Diagnosis not present

## 2019-01-04 DIAGNOSIS — Z91038 Other insect allergy status: Secondary | ICD-10-CM | POA: Diagnosis not present

## 2019-01-04 DIAGNOSIS — J0100 Acute maxillary sinusitis, unspecified: Secondary | ICD-10-CM

## 2019-01-04 DIAGNOSIS — L230 Allergic contact dermatitis due to metals: Secondary | ICD-10-CM | POA: Diagnosis not present

## 2019-01-04 DIAGNOSIS — J3089 Other allergic rhinitis: Secondary | ICD-10-CM

## 2019-01-04 MED ORDER — ALBUTEROL SULFATE HFA 108 (90 BASE) MCG/ACT IN AERS: 2.0000 | INHALATION_SPRAY | RESPIRATORY_TRACT | 3 refills | Status: DC | PRN

## 2019-01-04 MED ORDER — CARBINOXAMINE MALEATE 4 MG PO TABS: 8.0000 mg | ORAL_TABLET | Freq: Two times a day (BID) | ORAL | 0 refills | Status: DC | PRN

## 2019-01-04 NOTE — Telephone Encounter (Signed)
Last visit was in November with no mention of this problem so I will need to see her before we can treat this. I have room on my schedule this afternoon in [place] if she would like to be seen today.

## 2019-01-04 NOTE — Patient Instructions (Addendum)
Acute sinusitis Prednisone 10 mg tablet. Take 2 tablets twice a day for 4 days, then take 1 tablet on the 5th day then stop Continue saline rinses twice a day. Use these before medicated saline rinses Begin Flonase 2 sprays in each nostril once a day as needed for stuffy nose Stop Xyzal. Begin carbinoxamine maleate 8 mg twice a day   Moderate persistent asthma    Continue Symbicort 160-2 puffs twice a day with a spacer to prevent cough or wheeze Continue Spiriva Respimat 1.25 mcg-2 puffs once a day to prevent cough and wheeze Continue montelukast 10 mg once a day to prevent cough or wheeze ProAir 2 puffs every 4 hours as needed for cough or wheeze  Seasonal and perennial allergic rhinitis (indoor molds, outdoor molds, cockroach, dust mites) Continue with Singulair 10mg  daily. Saline rinses and Flonase as above  Allergic conjunctivitis Continue Pataday one drop per eye once daily as needed.   History of allergy to stinging insect and fire ant bite Continue to avoid stinging insects and fire ants. In case of an allergic reaction, take Benadryl 50 mg every 4 hours, and if life-threatening symptoms occur, inject with EpiPen 0.3 mg.  Allergy to metal Continue to avoid triggering metals (nickel, chromium, copper sulfate)  Follow up in 2 months or sooner if needed  Call the clinic if this treatment plan is not working well for you or if your symptoms worsen, do not improve, or you develop a fever

## 2019-01-04 NOTE — Telephone Encounter (Signed)
Patient will need to be called for an appointment to be made.

## 2019-01-04 NOTE — Progress Notes (Addendum)
[address] [place] [address] Dept: [phone]  FOLLOW UP NOTE  Patient ID: Dawn Jordan, female    DOB: 06-16-1966  Age: 53 y.o. MRN: [id number] Date of Office Visit: 01/04/2019  Assessment  Chief Complaint: Cough; Nasal Congestion; and Wheezing  HPI Dawn Jordan is a 53 year old female who presents to the clinic for a sick visit. She was last seen in this clinic on 10/21/2018 by Gareth Morgan, NP for evaluation of asthma, allergic rhinitis, atopic dermatitis, possible stinging insect allergy, and allergic conjunctivitis. At today's visit, she reports sinus pressure that is worse when bending over, bilateral ear pain, thick post nasal drainage, increased throat clearing, and clear nasal drainage that began about 4 days ago. She reports she is occasionally using Xyzal and saline nasal rinses, however, she has not been using Flonase. Asthma is reported as moderately well controlled with some shortness of breath with activity, wheezing which is worse at night, and a dry cough at night a few times a week. She is using Symbicort 160 one or two puffs a day, Spiriva 2 puffs once a day, montelukast 10 mg once a day, and albuterol 1-2 times a week. Her current medications are listed in the chart.   Drug Allergies:  Allergies  Allergen Reactions  . Eggs Or Egg-Derived Products Diarrhea  . Latex Anaphylaxis, Hives and Itching    "Anaphylaxis is only when I am in a closed area (ex: car with balloons)"  . Other Other (See Comments)    Sinus headache from new plastics, carpets, etc.  . Codeine     Headaches     Physical Exam: BP 128/86 (BP Location: Left Arm, Patient Position: Sitting, Cuff Size: Large)   Pulse 80   Temp 98.5 F (36.9 C) (Oral)   Resp 16   Ht 5\' 4"  (1.626 m)   Wt 241 lb 10 oz (109.6 kg)   LMP  (LMP Unknown)   SpO2 98%   BMI 41.47 kg/m    Physical Exam Vitals signs reviewed.  Constitutional:      Appearance: Normal appearance.  HENT:     Head: Normocephalic  and atraumatic.     Right Ear: Tympanic membrane normal.     Left Ear: Tympanic membrane normal.     Nose:     Comments: Bilateral nares slightly erythematous with clear nasal drainage noted. Pharynx slightly erythematous with no exudate noted. Ears normal. Eyes normal. Eyes:     Conjunctiva/sclera: Conjunctivae normal.  Neck:     Musculoskeletal: Normal range of motion and neck supple.  Cardiovascular:     Rate and Rhythm: Normal rate and regular rhythm.     Heart sounds: Normal heart sounds. No murmur.  Pulmonary:     Effort: Pulmonary effort is normal.     Breath sounds: Normal breath sounds.     Comments: Lungs clear to auscultation Musculoskeletal: Normal range of motion.  Skin:    General: Skin is warm and dry.  Neurological:     Mental Status: She is alert and oriented to person, place, and time.  Psychiatric:        Mood and Affect: Mood normal.        Behavior: Behavior normal.        Thought Content: Thought content normal.        Judgment: Judgment normal.     Diagnostics: FVC 2.81, FEV1 2.40.  Predicted FVC 3.50, predicted FEV1 2.75.  Spirometry indicates normal ventilatory function.  Assessment and Plan:  1. Acute non-recurrent maxillary sinusitis   2. Moderate persistent asthma with acute exacerbation   3. Seasonal and perennial allergic rhinitis   4. History of insect sting allergy   5. Allergic contact dermatitis due to metals     Meds ordered this encounter  Medications  . Carbinoxamine Maleate 4 MG TABS    Sig: Take 2 tablets (8 mg total) by mouth 2 (two) times daily as needed for up to 30 days.    Dispense:  28 each    Refill:  0  . albuterol (PROVENTIL HFA;VENTOLIN HFA) 108 (90 Base) MCG/ACT inhaler    Sig: Inhale 2 puffs into the lungs every 4 (four) hours as needed for up to 30 days for wheezing or shortness of breath.    Dispense:  1 Inhaler    Refill:  3    Patient Instructions  Acute sinusitis Prednisone 10 mg tablet. Take 2 tablets twice  a day for 4 days, then take 1 tablet on the 5th day then stop Continue saline rinses twice a day. Use these before medicated saline rinses Begin Flonase 2 sprays in each nostril once a day as needed for stuffy nose Stop Xyzal. Begin carbinoxamine maleate 8 mg twice a day   Moderate persistent asthma    Continue Symbicort 160-2 puffs twice a day with a spacer to prevent cough or wheeze Continue Spiriva Respimat 1.25 mcg-2 puffs once a day to prevent cough and wheeze Continue montelukast 10 mg once a day to prevent cough or wheeze ProAir 2 puffs every 4 hours as needed for cough or wheeze  Seasonal and perennial allergic rhinitis (indoor molds, outdoor molds, cockroach, dust mites) Continue with Singulair 10mg  daily. Saline rinses and Flonase as above  Allergic conjunctivitis Continue Pataday one drop per eye once daily as needed.   History of allergy to stinging insect and fire ant bite Continue to avoid stinging insects and fire ants. In case of an allergic reaction, take Benadryl 50 mg every 4 hours, and if life-threatening symptoms occur, inject with EpiPen 0.3 mg.  Allergy to metal Continue to avoid triggering metals (nickel, chromium, copper sulfate)  Follow up in 2 months or sooner if needed  Call the clinic if this treatment plan is not working well for you or if your symptoms worsen, do not improve, or you develop a fever   Return in about 2 months (around 03/05/2019), or if symptoms worsen or fail to improve.    Thank you for the opportunity to care for this patient.  Please do not hesitate to contact me with questions.  Gareth Morgan, FNP [facility]  _________________________________________________  I have provided oversight concerning Dawn Jordan's evaluation and treatment of this patient's health issues addressed during today's encounter.  I agree with the assessment and therapeutic plan as outlined in the note.   Signed,   R Edgar Frisk, MD

## 2019-01-04 NOTE — Telephone Encounter (Signed)
Called and spoke with patient and she stated she could come to the [facility] today. Patient has scheduled appt for 2:15 pm

## 2019-01-04 NOTE — Telephone Encounter (Signed)
Noted  

## 2019-01-06 DIAGNOSIS — M2550 Pain in unspecified joint: Secondary | ICD-10-CM | POA: Diagnosis not present

## 2019-01-06 DIAGNOSIS — M797 Fibromyalgia: Secondary | ICD-10-CM | POA: Diagnosis not present

## 2019-01-06 DIAGNOSIS — Z79899 Other long term (current) drug therapy: Secondary | ICD-10-CM | POA: Diagnosis not present

## 2019-01-06 DIAGNOSIS — M0609 Rheumatoid arthritis without rheumatoid factor, multiple sites: Secondary | ICD-10-CM | POA: Diagnosis not present

## 2019-01-06 DIAGNOSIS — Z6841 Body Mass Index (BMI) 40.0 and over, adult: Secondary | ICD-10-CM | POA: Diagnosis not present

## 2019-01-11 ENCOUNTER — Ambulatory Visit: Payer: Medicare Other | Admitting: Family Medicine

## 2019-01-13 ENCOUNTER — Ambulatory Visit: Payer: Self-pay | Admitting: *Deleted

## 2019-01-13 NOTE — Telephone Encounter (Signed)
Will monitor for ED arrival.  

## 2019-01-13 NOTE — Telephone Encounter (Signed)
Pt calling to states she had three episodes in which she was in excruciating pain on the left side of chest and felt like her heart was beating rapidly. The most recent episode was on yesterday and the pt states she felt like something was sitting on her chest and felt like she could not catch her breath.Pt also states that during the time the episodes occurred she felt dizzy and states that the episodes will wake her up out of her sleep at night. Pt states she also had these symptoms on Wed morning and approximately 2 weeks ago. Pt states on Wed she went to the ED but left before being seen because there was a wait an she was scared that she was going to catch the flu. Pt states she was scheduled to see Dr. [place] on Wed as well but cancelled the appt because she was in the ED.Pt states she is not experiencing any symptoms currently. Pt advised to seek treatment in the ED. Pt verbalized understanding.                                 Reason for Disposition . [1] Heart beating very rapidly (e.g., > 140 / minute) AND [2] not present now  (Exception: during exercise)  Answer Assessment - Initial Assessment Questions 1. DESCRIPTION: "Please describe your heart rate or heart beat that you are having" (e.g., fast/slow, regular/irregular, skipped or extra beats, "palpitations")     Heart felt fast on yesterday but not currently 2. ONSET: "When did it start?" (Minutes, hours or days)      Occurred yesterday around 3 am but initial time it occurred was 2 weeks ago 3. PATTERN "Does it come and go, or has it been constant since it started?"  "Does it get worse with exertion?"   "Are you feeling it now?"     Comes and goes but pt states she is not feeling this way now     4. RECURRENT SYMPTOM: "Have you ever had this before?" If so, ask: "When was the last time?" and "What happened that time?"      Yes, happened during the middle of the night on Tuesday and Wednesday and then 2 weeks ago 5. CAUSE: "What do you  think is causing the palpitations?"     unknown 6. CARDIAC HISTORY: "Do you have any history of heart disease?" (e.g., heart attack, angina, bypass surgery, angioplasty, arrhythmia)      Family history of heart disease and pt states she has a history of irregular HB 7. OTHER SYMPTOMS: "Do you have any other symptoms?" (e.g., dizziness, chest pain, sweating, difficulty breathing)       Did have some dizziness and SOB when episode occured  Protocols used: HEART RATE AND HEARTBEAT QUESTIONS-A-AH

## 2019-01-17 NOTE — Telephone Encounter (Signed)
Pt did not seek care in any local ED or UC per chart. Pt has not called to schedule OV.  LMTCB to check on pt and offer appt.

## 2019-01-19 ENCOUNTER — Ambulatory Visit (HOSPITAL_COMMUNITY): Payer: Medicare Other | Admitting: Psychiatry

## 2019-01-19 ENCOUNTER — Encounter (HOSPITAL_COMMUNITY): Payer: Self-pay | Admitting: Psychiatry

## 2019-01-19 VITALS — BP 140/82 | Ht 64.0 in | Wt 248.0 lb

## 2019-01-19 DIAGNOSIS — F331 Major depressive disorder, recurrent, moderate: Secondary | ICD-10-CM

## 2019-01-19 DIAGNOSIS — F4310 Post-traumatic stress disorder, unspecified: Secondary | ICD-10-CM

## 2019-01-19 DIAGNOSIS — F900 Attention-deficit hyperactivity disorder, predominantly inattentive type: Secondary | ICD-10-CM

## 2019-01-19 MED ORDER — CLONAZEPAM 0.5 MG PO TABS: 0.2500 mg | ORAL_TABLET | Freq: Two times a day (BID) | ORAL | 2 refills | Status: DC | PRN

## 2019-01-19 MED ORDER — LITHIUM CARBONATE ER 300 MG PO TBCR: 600.0000 mg | EXTENDED_RELEASE_TABLET | Freq: Every day | ORAL | 2 refills | Status: DC

## 2019-01-19 MED ORDER — METHYLPHENIDATE HCL 20 MG PO TABS: 20.0000 mg | ORAL_TABLET | Freq: Two times a day (BID) | ORAL | 0 refills | Status: DC
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
## 2019-02-02 ENCOUNTER — Ambulatory Visit (HOSPITAL_COMMUNITY): Payer: Medicare Other | Admitting: Licensed Clinical Social Worker

## 2019-02-06 ENCOUNTER — Ambulatory Visit (INDEPENDENT_AMBULATORY_CARE_PROVIDER_SITE_OTHER): Payer: 59 | Admitting: Family Medicine

## 2019-02-06 ENCOUNTER — Encounter: Payer: Self-pay | Admitting: Family Medicine

## 2019-02-06 VITALS — BP 116/88 | HR 85 | Temp 98.4°F | Resp 12 | Ht 64.0 in | Wt 246.4 lb

## 2019-02-06 DIAGNOSIS — K219 Gastro-esophageal reflux disease without esophagitis: Secondary | ICD-10-CM | POA: Diagnosis not present

## 2019-02-06 DIAGNOSIS — R079 Chest pain, unspecified: Secondary | ICD-10-CM

## 2019-02-06 DIAGNOSIS — M797 Fibromyalgia: Secondary | ICD-10-CM | POA: Diagnosis not present

## 2019-02-06 DIAGNOSIS — R002 Palpitations: Secondary | ICD-10-CM | POA: Diagnosis not present

## 2019-02-06 MED ORDER — OMEPRAZOLE 40 MG PO CPDR: DELAYED_RELEASE_CAPSULE | ORAL | 0 refills | Status: DC

## 2019-02-06 NOTE — Progress Notes (Signed)
ACUTE VISIT   HPI:  Chief Complaint  Patient presents with  . Chest Pain    patient complains of chest pain which she notices at night x2 months,initially noticed indigestion, used Tums with no relief, used Nitroglycerin last night  . Palpitations    x2 months  . Tachycardia    Dawn Jordan is a 53 y.o. female, who is here today with her wife complaining of intermittent left-sided chest pain for the past 3 months.  About 2 times per week she has chest pain,usually sudden onset,not related with exertion. Most of the time it happens at night, pain wakes her up. "Squeezing" like sensation,sometiems "11/10", it usually lasts 3-10 minutes. States a couple times she felt pain radiated to left side of neck,jaw,and shoulder.  + Associated diaphoresis and palpitations,HR 140's No associated dyspnea. She has not identified exacerbating or alleviating factors.  She took Nitroglycerin she had left from old Rx,expired. Pain improved 10-15 min after taking medication,has taken it x 2. Last night symptoms were associated with "indigestion" and heartburn, she took Tums but it did not seem to help  GERD: She is on Omeprazole 40 mg,which she takes occasionally.   Musculoskeletal chest pain is listed on her problem list. She had palpitations intermittent for a while. Cardiac monitoring in 05/2017.  She has had several ER visits complaining of chest pain and hospitalization for chest pain, 11/2014,12/2014, 05/2017.  Hx of fibromyalgia,she wonders if some of these symptoms are side effects of Lyrica. She follows with rheumatologist. Anxiety, follows with psychiatrist.   Review of Systems  Constitutional: Positive for diaphoresis. Negative for activity change, fatigue and fever.  HENT: Negative for mouth sores, nosebleeds, sore throat and trouble swallowing.   Eyes: Negative for redness and visual disturbance.  Respiratory: Negative for cough, shortness of breath and wheezing.     Cardiovascular: Positive for chest pain and palpitations. Negative for leg swelling.  Gastrointestinal: Negative for abdominal pain, nausea and vomiting.       Negative for changes in bowel habits.  Genitourinary: Negative for decreased urine volume and hematuria.  Musculoskeletal: Positive for myalgias. Negative for gait problem and joint swelling.  Skin: Negative for rash.  Neurological: Negative for syncope, weakness and headaches.  Psychiatric/Behavioral: Positive for sleep disturbance. The patient is nervous/anxious.     Current Outpatient Medications on File Prior to Visit  Medication Sig Dispense Refill  . acetaminophen (TYLENOL) 500 MG tablet Take 1 tablet (500 mg total) by mouth every 6 (six) hours as needed. 30 tablet 0  . ACTEMRA 162 MG/0.9ML SOSY     . albuterol (PROVENTIL) (2.5 MG/3ML) 0.083% nebulizer solution Please specify directions, refills and quantity 120 mL 1  . budesonide-formoterol (SYMBICORT) 160-4.5 MCG/ACT inhaler TAKE 2 PUFFS BY MOUTH TWICE A DAY (Patient taking differently: 1-2 puffs daily) 3 Inhaler 0  . clonazePAM (KLONOPIN) 0.5 MG tablet Take 0.5 tablets (0.25 mg total) by mouth 2 (two) times daily as needed for anxiety. 30 tablet 2  . cyclobenzaprine (FLEXERIL) 10 MG tablet Take 1 tablet (10 mg total) by mouth 2 (two) times daily as needed for muscle spasms. 20 tablet 0  . diclofenac sodium (VOLTAREN) 1 % GEL Apply 1 application topically 2 (two) times daily as needed (back and knee pain).     Marland Kitchen EPINEPHrine (EPIPEN 2-PAK) 0.3 mg/0.3 mL IJ SOAJ injection Use for life threatening allergic reactions 2 Device 6  . levocetirizine (XYZAL) 5 MG tablet Take 1 tablet (5 mg total) by mouth  every evening. 30 tablet 5  . lithium carbonate (LITHOBID) 300 MG CR tablet Take 2 tablets (600 mg total) by mouth daily. 60 tablet 2  . Loperamide HCl (IMODIUM PO) Take by mouth as needed.    . methylphenidate (RITALIN) 20 MG tablet Take 1 tablet (20 mg total) by mouth 2 (two) times  daily for 30 days. 60 tablet 0  . methylphenidate (RITALIN) 20 MG tablet Take 1 tablet (20 mg total) by mouth 2 (two) times daily with breakfast and lunch. 60 tablet 0  . montelukast (SINGULAIR) 10 MG tablet Take 1 tablet (10 mg total) by mouth at bedtime. 30 tablet 5  . nabumetone (RELAFEN) 500 MG tablet Take 500 mg by mouth 3 (three) times daily.  3  . nitroGLYCERIN (NITROSTAT) 0.4 MG SL tablet Place 1 tablet (0.4 mg total) under the tongue every 5 (five) minutes as needed for chest pain. 30 tablet 12  . pregabalin (LYRICA) 200 MG capsule Take 200 mg by mouth 3 (three) times daily.    Marland Kitchen SPIRIVA RESPIMAT 1.25 MCG/ACT AERS INHALE 2 PUFFS BY MOUTH INTO THE LUNGS DAILY (Patient taking differently: 2 puffs. Does as needed) 4 g 5  . albuterol (PROVENTIL HFA;VENTOLIN HFA) 108 (90 Base) MCG/ACT inhaler Inhale 2 puffs into the lungs every 4 (four) hours as needed for up to 30 days for wheezing or shortness of breath. 1 Inhaler 3   Current Facility-Administered Medications on File Prior to Visit  Medication Dose Route Frequency Provider Last Rate Last Dose  . 0.9 %  sodium chloride infusion  500 mL Intravenous Continuous Milus Banister, MD         Past Medical History:  Diagnosis Date  . Anemia   . Anginal pain ([place])   . Anxiety   . Asthma   . Chronic fatigue syndrome   . COPD (chronic obstructive pulmonary disease) ([place])   . Depression   . Fibromyalgia   . GERD (gastroesophageal reflux disease)   . Headache    "weekly-monthly" (06/04/2017)  . History of gout   . History of kidney stones   . Left sciatic nerve pain   . OSA (obstructive sleep apnea)    "getting ready to retest" (06/04/2017)  . Rheumatoid arthritis ([place])   . Seasonal allergies    Allergies  Allergen Reactions  . Eggs Or Egg-Derived Products Diarrhea  . Latex Anaphylaxis, Hives and Itching    "Anaphylaxis is only when I am in a closed area (ex: car with balloons)"  . Other Other (See Comments)    Sinus headache from  new plastics, carpets, etc.  . Codeine     Headaches     Social History   Socioeconomic History  . Marital status: Legally Separated    Spouse name: Not on file  . Number of children: 0  . Years of education: Not on file  . Highest education level: Not on file  Occupational History  . Occupation: not employed-disabled  Social Needs  . Financial resource strain: Not on file  . Food insecurity:    Worry: Not on file    Inability: Not on file  . Transportation needs:    Medical: Not on file    Non-medical: Not on file  Tobacco Use  . Smoking status: Never Smoker  . Smokeless tobacco: Never Used  Substance and Sexual Activity  . Alcohol use: No  . Drug use: No  . Sexual activity: Never  Lifestyle  . Physical activity:    Days  per week: Not on file    Minutes per session: Not on file  . Stress: Not on file  Relationships  . Social connections:    Talks on phone: Not on file    Gets together: Not on file    Attends religious service: Not on file    Active member of club or organization: Not on file    Attends meetings of clubs or organizations: Not on file    Relationship status: Not on file  Other Topics Concern  . Not on file  Social History Narrative   Born in [place], [place]   Unemployed, on disability since 1991 for anxiety,    Education: [employer]   Single, no children, lives with her friend (used to live with her mother with stroke, now in ALF)       Vitals:   02/06/19 1532  BP: 116/88  Pulse: 85  Resp: 12  Temp: 98.4 F (36.9 C)  SpO2: 98%   Body mass index is 42.29 kg/m.   Physical Exam  Nursing note and vitals reviewed. Constitutional: She is oriented to person, place, and time. She appears well-developed. No distress.  HENT:  Head: Normocephalic and atraumatic.  Mouth/Throat: Oropharynx is clear and moist and mucous membranes are normal.  Eyes: Pupils are equal, round, and reactive to light. Conjunctivae are  normal.  Cardiovascular: Normal rate and regular rhythm.  No murmur heard. Pulses:      Dorsalis pedis pulses are 2+ on the right side and 2+ on the left side.  Respiratory: Effort normal and breath sounds normal. No respiratory distress. She exhibits tenderness (Left-sided).  GI: Soft. She exhibits no mass. There is no hepatomegaly. There is no abdominal tenderness.  Musculoskeletal:        General: No edema.  Lymphadenopathy:    She has no cervical adenopathy.  Neurological: She is alert and oriented to person, place, and time. She has normal strength. No cranial nerve deficit. Gait normal.  Skin: Skin is warm. No rash noted. No erythema.  Psychiatric: Her mood appears anxious.  Well groomed, good eye contact.    ASSESSMENT AND PLAN:  Ms. Jeri was seen today for chest pain, palpitations and tachycardia.  Orders Placed This Encounter  Procedures  . Ambulatory referral to Cardiology  . EKG 12-Lead    Chest pain, unspecified type We discussed possible etiologies. ? Musculoskeletal,GERD,and cardiac among some. EKG today: SR, normal intervals, LAD. Compared with EKG 06/06/17 no significant changes.  She reports having chemical stress test a few years ago when living in [place] and reported as negative.  Clearly instructed about warning signs. Cardiology referral placed.  -     EKG 12-Lead -     Ambulatory referral to Cardiology   Fibromyalgia syndrome This problem could be contributing to chest wall pain. No changes in Lyrica or Flexeril. Continue following with rheumatologist.  Gastroesophageal reflux disease Some of the history suggest GERD-like symptoms. Recommend starting omeprazole 40 mg daily instead PRN, 3 to 4 weeks. GERD precautions discussed.  Heart palpitations Chronic. She has had work up done and reported a negative. ? Anxiety. Clearly instructed about warning signs.    Return if symptoms worsen or fail to improve.      Winnie Barsky G. [place],  MD  [facility]. [facility] [place] office.

## 2019-02-06 NOTE — Assessment & Plan Note (Signed)
Some of the history suggest GERD-like symptoms. Recommend starting omeprazole 40 mg daily instead PRN, 3 to 4 weeks. GERD precautions discussed.

## 2019-02-06 NOTE — Patient Instructions (Signed)
A few things to remember from today's visit:   Chest pain, unspecified type - Plan: EKG 12-Lead, Ambulatory referral to Cardiology  Fibromyalgia syndrome  Gastroesophageal reflux disease without esophagitis  Start taking omeprazole 40 mg daily 30-minute before breakfast.  Continue Lyrica for fibromyalgia, which could explain some of the pain. If chest pain presents and radiates to your neck and your left arm + shortness of breath + clammy sensation you need to seek better medical attention.   GERD:  Avoid foods that make your symptoms worse, for example coffee, chocolate,pepermeint,alcohol, and greasy food. Raising the head of your bed about 6 inches may help with nocturnal symptoms.  Avoid tobacco use. Weight loss (if you are overweight). Avoid lying down for 3 hours after eating.  Instead 3 large meals daily try small and more frequent meals during the day.  Every medication have side effects and medications for GERD are not the exception.At this time I think benefit is greater than risk.    You should be evaluated immediately if bloody vomiting, bloody stools, black stools (like tar), difficulty swallowing, food gets stuck on the way down or choking when eating. Abnormal weight loss or severe abdominal pain.  If symptoms are not resolved sometimes endoscopy is necessary.  Please be sure medication list is accurate. If a new problem present, please set up appointment sooner than planned today.

## 2019-02-06 NOTE — Assessment & Plan Note (Signed)
This problem could be contributing to chest wall pain. No changes in Lyrica or Flexeril. Continue following with rheumatologist.

## 2019-02-13 ENCOUNTER — Ambulatory Visit (INDEPENDENT_AMBULATORY_CARE_PROVIDER_SITE_OTHER): Payer: Medicare Other | Admitting: Licensed Clinical Social Worker

## 2019-02-13 ENCOUNTER — Encounter (HOSPITAL_COMMUNITY): Payer: Self-pay | Admitting: Licensed Clinical Social Worker

## 2019-02-13 DIAGNOSIS — F900 Attention-deficit hyperactivity disorder, predominantly inattentive type: Secondary | ICD-10-CM | POA: Diagnosis not present

## 2019-02-13 DIAGNOSIS — F4310 Post-traumatic stress disorder, unspecified: Secondary | ICD-10-CM | POA: Diagnosis not present

## 2019-02-13 NOTE — Progress Notes (Signed)
   THERAPIST PROGRESS NOTE  Session Time: 3:30pm-4:30pm  Participation Level: Active  Behavioral Response: NeatAlertIrritable  Type of Therapy: Individual Therapy  Treatment Goals addressed: "to get the ADHD under control and process trauma"  Interventions: CBT, Motivational Interviewing, grounding and mindfulness techniques, psychoeducation  Summary: Dawn Jordan is a 53 y.o. female who presents with PTSD and ADHD Combined Presentation  Suicidal/Homicidal: No - without intent/plan  Therapist Response: Dawn Jordan met with clinician for an individual session. Dawn Jordan discussed her psychiatric symptoms, her current life events and her homework. Dawn Jordan entered session with a limp and reported a great deal of pain in her knees. She identified plan to have total knee replacement in the coming months, but until then, her doctor will not give any pain relief medication or shots. Clinician explored other medications and noted that due to fear of Coronavirus, Dawn Jordan has also not received her injection for Rheumatoid Arthritis. She reports this medication can decrease her immune system, so she has not had this weekly injection in almost 3 weeks. Clinician encouraged Dawn Jordan to contact her doctor and ask about her risk level so she can get her injection.  Clinician explored stress levels, which have been higher than usual, due to worries about family members. Clinician utilized MI OARS to reflect and summarize concerns and also explored Dawn Jordan's actual ability to be helpful.   Plan: Return again in 3-4 weeks.  Diagnosis:     Axis I: PTSD and ADHD Combined Presentation   [name], LCSW 02/13/2019

## 2019-02-22 ENCOUNTER — Encounter: Payer: Self-pay | Admitting: Cardiology

## 2019-03-01 ENCOUNTER — Other Ambulatory Visit: Payer: Self-pay

## 2019-03-01 ENCOUNTER — Ambulatory Visit (HOSPITAL_COMMUNITY): Payer: Medicare Other | Admitting: Licensed Clinical Social Worker

## 2019-03-02 ENCOUNTER — Other Ambulatory Visit: Payer: Self-pay

## 2019-03-02 ENCOUNTER — Encounter: Payer: Self-pay | Admitting: Cardiology

## 2019-03-02 ENCOUNTER — Telehealth: Payer: Medicare Other | Admitting: Cardiology

## 2019-03-02 ENCOUNTER — Telehealth: Payer: Self-pay | Admitting: Cardiology

## 2019-03-02 ENCOUNTER — Telehealth (INDEPENDENT_AMBULATORY_CARE_PROVIDER_SITE_OTHER): Payer: 59 | Admitting: Cardiology

## 2019-03-02 ENCOUNTER — Ambulatory Visit: Payer: Medicare Other | Admitting: Cardiology

## 2019-03-02 VITALS — Ht 64.0 in | Wt 245.0 lb

## 2019-03-02 DIAGNOSIS — R079 Chest pain, unspecified: Secondary | ICD-10-CM

## 2019-03-02 DIAGNOSIS — F909 Attention-deficit hyperactivity disorder, unspecified type: Secondary | ICD-10-CM

## 2019-03-02 DIAGNOSIS — Z8249 Family history of ischemic heart disease and other diseases of the circulatory system: Secondary | ICD-10-CM

## 2019-03-02 MED ORDER — NITROGLYCERIN 0.4 MG SL SUBL: 0.4000 mg | SUBLINGUAL_TABLET | SUBLINGUAL | 6 refills | Status: DC | PRN

## 2019-03-02 MED ORDER — ASPIRIN EC 81 MG PO TBEC: 81.0000 mg | DELAYED_RELEASE_TABLET | Freq: Every day | ORAL | 3 refills | Status: DC

## 2019-03-02 NOTE — Patient Instructions (Signed)
Medication Instructions:  Please start Asprin 81 mg daily. Use SL Nitroglycerin 0.4 mg as instructed for chest pain. Continue all other medications as listed.  If you need a refill on your cardiac medications before your next appointment, please call your pharmacy.   Testing/Procedures: Your physician has requested that you have a lexiscan myoview. You will find instructions for this test further down on this document.   Follow-Up: At [facility], you and your health needs are our priority.  As part of our continuing mission to provide you with exceptional heart care, we have created designated Provider Care Teams.  These Care Teams include your primary Cardiologist (physician) and Advanced Practice Providers (APPs -  Physician Assistants and Nurse Practitioners) who all work together to provide you with the care you need, when you need it. You will need a follow up appointment in 3 months.  Please call our office 2 months in advance to schedule this appointment.  You may see Dr Candee Furbish or one of the following Advanced Practice Providers on your designated Care Team:   Truitt Merle, NP Cecilie Kicks, NP . Kathyrn Drown, NP  Thank you for choosing [facility]!!       [facility] Cardiovascular Imaging at [facility] [address] [place], [place] [address] Phone: [phone]  You are scheduled for a Myocardial Perfusion Imaging Study on:  Friday March 03, 2019  at 7:30 AM .  Please arrive 15 minutes prior to your appointment time for registration and insurance purposes.  The test will take approximately 3 to 4 hours to complete; you may bring reading material.  If someone comes with you to your appointment, they will need to remain in the main lobby due to limited space in the testing area. **If you are pregnant or breastfeeding, please notify the nuclear lab prior to your appointment**  How to prepare for your Myocardial Perfusion Test: . Do not  eat or drink 3 hours prior to your test, except you may have water. . Do not consume products containing caffeine (regular or decaffeinated) 12 hours prior to your test. (ex: coffee, chocolate, sodas, tea). . Please bring a current medication list and your insurance cards with you. . Do wear comfortable clothes (no dresses or overalls) and walking shoes, tennis shoes preferred (No heels or open toe shoes are allowed). . Do NOT wear cologne, perfume, aftershave, or lotions (deodorant is allowed). . If these instructions are not followed, your test will have to be rescheduled.  Please report to [address], [address] for your test.  If you have questions or concerns about your appointment, you can call the Nuclear Lab at [phone].  If you cannot keep your appointment, please provide 24 hours notification to the Nuclear Lab, to avoid a possible $50 charge to your account.   Nitroglycerin sublingual tablets What is this medicine? NITROGLYCERIN (nye troe GLI ser in) is a type of vasodilator. It relaxes blood vessels, increasing the blood and oxygen supply to your heart. This medicine is used to relieve chest pain caused by angina. It is also used to prevent chest pain before activities like climbing stairs, going outdoors in cold weather, or sexual activity. This medicine may be used for other purposes; ask your health care provider or pharmacist if you have questions. COMMON BRAND NAME(S): Nitroquick, Nitrostat, Nitrotab What should I tell my health care provider before I take this medicine? They need to know if you have any of these  conditions: -anemia -head injury, recent stroke, or bleeding in the brain -liver disease -previous heart attack -an unusual or allergic reaction to nitroglycerin, other medicines, foods, dyes, or preservatives -pregnant or trying to get pregnant -breast-feeding How should I use this medicine? Take this medicine by mouth as needed. At the first sign of  an angina attack (chest pain or tightness) place one tablet under your tongue. You can also take this medicine 5 to 10 minutes before an event likely to produce chest pain. Follow the directions on the prescription label. Let the tablet dissolve under the tongue. Do not swallow whole. Replace the dose if you accidentally swallow it. It will help if your mouth is not dry. Saliva around the tablet will help it to dissolve more quickly. Do not eat or drink, smoke or chew tobacco while a tablet is dissolving. If you are not better within 5 minutes after taking ONE dose of nitroglycerin, call 9-1-1 immediately to seek emergency medical care. Do not take more than 3 nitroglycerin tablets over 15 minutes. If you take this medicine often to relieve symptoms of angina, your doctor or health care professional may provide you with different instructions to manage your symptoms. If symptoms do not go away after following these instructions, it is important to call 9-1-1 immediately. Do not take more than 3 nitroglycerin tablets over 15 minutes. Talk to your pediatrician regarding the use of this medicine in children. Special care may be needed. Overdosage: If you think you have taken too much of this medicine contact a poison control center or emergency room at once. NOTE: This medicine is only for you. Do not share this medicine with others. What if I miss a dose? This does not apply. This medicine is only used as needed. What may interact with this medicine? Do not take this medicine with any of the following medications: -certain migraine medicines like ergotamine and dihydroergotamine (DHE) -medicines used to treat erectile dysfunction like sildenafil, tadalafil, and vardenafil -riociguat This medicine may also interact with the following medications: -alteplase -aspirin -heparin -medicines for high blood pressure -medicines for mental depression -other medicines used to treat angina -phenothiazines like  chlorpromazine, mesoridazine, prochlorperazine, thioridazine This list may not describe all possible interactions. Give your health care provider a list of all the medicines, herbs, non-prescription drugs, or dietary supplements you use. Also tell them if you smoke, drink alcohol, or use illegal drugs. Some items may interact with your medicine. What should I watch for while using this medicine? Tell your doctor or health care professional if you feel your medicine is no longer working. Keep this medicine with you at all times. Sit or lie down when you take your medicine to prevent falling if you feel dizzy or faint after using it. Try to remain calm. This will help you to feel better faster. If you feel dizzy, take several deep breaths and lie down with your feet propped up, or bend forward with your head resting between your knees. You may get drowsy or dizzy. Do not drive, use machinery, or do anything that needs mental alertness until you know how this drug affects you. Do not stand or sit up quickly, especially if you are an older patient. This reduces the risk of dizzy or fainting spells. Alcohol can make you more drowsy and dizzy. Avoid alcoholic drinks. Do not treat yourself for coughs, colds, or pain while you are taking this medicine without asking your doctor or health care professional for advice. Some ingredients  may increase your blood pressure. What side effects may I notice from receiving this medicine? Side effects that you should report to your doctor or health care professional as soon as possible: -blurred vision -dry mouth -skin rash -sweating -the feeling of extreme pressure in the head -unusually weak or tired Side effects that usually do not require medical attention (report to your doctor or health care professional if they continue or are bothersome): -flushing of the face or neck -headache -irregular heartbeat, palpitations -nausea, vomiting This list may not describe  all possible side effects. Call your doctor for medical advice about side effects. You may report side effects to FDA at 1-800-FDA-1088. Where should I keep my medicine? Keep out of the reach of children. Store at room temperature between 20 and 25 degrees C (68 and 77 degrees F). Store in [facility] [employer]. Protect from light and moisture. Keep tightly closed. Throw away any unused medicine after the expiration date. NOTE: This sheet is a summary. It may not cover all possible information. If you have questions about this medicine, talk to your doctor, pharmacist, or health care provider.  2019 Elsevier/Gold Standard (2013-09-21 17:57:36)

## 2019-03-02 NOTE — Progress Notes (Signed)
Virtual Visit via Video Note    Evaluation Performed:  Follow-up visit  This visit type was conducted due to national recommendations for restrictions regarding the COVID-19 Pandemic (e.g. social distancing).  This format is felt to be most appropriate for this patient at this time.  All issues noted in this document were discussed and addressed.  No physical exam was performed (except for noted visual exam findings with Video Visits).  Please refer to the patient's chart (MyChart message for video visits and phone note for telephone visits) for the patient's consent to telehealth for [facility].  Date:  03/02/2019   ID:  Dawn Jordan, DOB 1966-04-21, MRN [id number]  Patient Location:  [address], [address] [place] [address]   Provider location: [facility] PCP:  Martinique, Betty G, MD  Cardiologist:  No primary care provider on file.  Dr. Marlou Porch Electrophysiologist:  None   Chief Complaint: Evaluation of chest pain at the request of Dr. Martinique  History of Present Illness:    Dawn Jordan is a 53 y.o. female who presents via audio/video conferencing for a telehealth visit today.    The patient does not symptoms concerning for COVID-19 infection (fever, chills, cough, or new SHORTNESS OF BREATH).   She is being seen today for the evaluation of chest pain at the request of Dr. Betty Martinique.  Back on 02/06/2019 she was seen in the office with Dr. Martinique stating that she was having some intermittent left-sided chest pain over the past 3 months.  About 2 times per week she was having sudden onset chest discomfort at rest sometimes waking her up squeezing 11/10 lasting about 3 to 10 minutes.  Sometimes they would radiate to her neck jaw and shoulder.  Occasionally she would feel increased heart rates and anxiety with this with heart rates increasing to the 140s.  No real shortness of breath.  She would have some diaphoresis.  She actually had some old nitroglycerin which was  expired and she thinks that the pain had improved.  Sometimes it feels like indigestion.  Tums previously did not help.  She is also been treated in the past for depression as well as PTSD.  Multiple ER visits have taken place in the past in for chest discomfort.  She also has a history of fibromyalgia and wonders if these are some side effects of this.  She had an EKG performed on 02/06/2019 which showed sinus rhythm no other adverse changes personally reviewed and interpreted.  No changes from 2018.  Back in [place] she had a nuclear stress test that was negative.  On Prilosec now.  These last episode was severe. Has been feeling this with exertion. Had it with standing at register. [place] she had an episode where the registrar was worried about her, did not have to call the ambulance.   Brother had MI at 78 playing [place] at [place].  Both sides of the family have heart issues. Mother now on palliative care.   No TOB, no DM.   Prior CV studies:   The following studies were reviewed today:  Nuclear stress test 2016 showed apical defect small could be shifting breast artifact, some T wave inversions noted during Lexiscan infusion.  EKG personally reviewed shows sinus rhythm left axis deviation with no other significant ischemic changes.  Personally reviewed  Past Medical History:  Diagnosis Date   Anemia    Anginal pain ([place])    Anxiety  Asthma    Chronic fatigue syndrome    COPD (chronic obstructive pulmonary disease) (HCC)    Depression    Fibromyalgia    GERD (gastroesophageal reflux disease)    Headache    "weekly-monthly" (06/04/2017)   History of gout    History of kidney stones    Left sciatic nerve pain    OSA (obstructive sleep apnea)    "getting ready to retest" (06/04/2017)   Rheumatoid arthritis ([place])    Seasonal allergies    Past Surgical History:  Procedure Laterality Date   CARPAL TUNNEL RELEASE Left    CYST EXCISION      "little cysts taken off both hands and left arm" (06/04/2017)   KNEE ARTHROSCOPY Bilateral    "3 on my left; 2 on my right" (06/04/2017)   LAPAROSCOPIC CHOLECYSTECTOMY     TONSILLECTOMY       Current Meds  Medication Sig   acetaminophen (TYLENOL) 500 MG tablet Take 1 tablet (500 mg total) by mouth every 6 (six) hours as needed.   ACTEMRA 162 MG/0.9ML SOSY    albuterol (PROVENTIL HFA;VENTOLIN HFA) 108 (90 Base) MCG/ACT inhaler Inhale 2 puffs into the lungs every 4 (four) hours as needed for up to 30 days for wheezing or shortness of breath.   albuterol (PROVENTIL) (2.5 MG/3ML) 0.083% nebulizer solution Please specify directions, refills and quantity   budesonide-formoterol (SYMBICORT) 160-4.5 MCG/ACT inhaler TAKE 2 PUFFS BY MOUTH TWICE A DAY   clonazePAM (KLONOPIN) 0.5 MG tablet Take 0.5 tablets (0.25 mg total) by mouth 2 (two) times daily as needed for anxiety.   cyclobenzaprine (FLEXERIL) 10 MG tablet Take 1 tablet (10 mg total) by mouth 2 (two) times daily as needed for muscle spasms.   diclofenac sodium (VOLTAREN) 1 % GEL Apply 1 application topically 2 (two) times daily as needed (back and knee pain).    EPINEPHrine (EPIPEN 2-PAK) 0.3 mg/0.3 mL IJ SOAJ injection Use for life threatening allergic reactions   HYDROcodone-acetaminophen (NORCO/VICODIN) 5-325 MG tablet Take 1 tablet by mouth 2 (two) times daily as needed.   levocetirizine (XYZAL) 5 MG tablet Take 1 tablet (5 mg total) by mouth every evening.   lithium carbonate (LITHOBID) 300 MG CR tablet Take 2 tablets (600 mg total) by mouth daily.   Loperamide HCl (IMODIUM PO) Take by mouth as needed.   methylphenidate (RITALIN) 20 MG tablet Take 1 tablet (20 mg total) by mouth 2 (two) times daily with breakfast and lunch.   montelukast (SINGULAIR) 10 MG tablet Take 1 tablet (10 mg total) by mouth at bedtime.   nabumetone (RELAFEN) 500 MG tablet Take 500 mg by mouth 3 (three) times daily.   nitroGLYCERIN (NITROSTAT) 0.4  MG SL tablet Place 1 tablet (0.4 mg total) under the tongue every 5 (five) minutes as needed for chest pain.   omeprazole (PRILOSEC) 40 MG capsule TAKE 1 CAPSULE EVERY DAY 30 min BEFORE BREAKFAST   pregabalin (LYRICA) 200 MG capsule Take 200 mg by mouth 2 (two) times daily.    Current Facility-Administered Medications for the 03/02/19 encounter (Telemedicine) with Jerline Pain, MD  Medication   0.9 %  sodium chloride infusion     Allergies:   Eggs or egg-derived products; Latex; Other; and Codeine   Social History   Tobacco Use   Smoking status: Never Smoker   Smokeless tobacco: Never Used  Substance Use Topics   Alcohol use: No   Drug use: No     Family Hx: The patient's family history  includes Allergic rhinitis in her brother and mother; Arthritis in her mother; Cancer in her paternal grandmother; Colon cancer in her paternal aunt; Diabetes in her maternal grandfather, mother, and paternal grandmother; Hyperlipidemia in her mother; Hypertension in her maternal grandfather, mother, and paternal grandmother; Lung cancer in her maternal uncle and another family member; Mental retardation in her mother; Stroke in her mother. There is no history of Asthma, Eczema, Immunodeficiency, Urticaria, Atopy, Angioedema, Esophageal cancer, Stomach cancer, or Rectal cancer.  ROS:   Please see the history of present illness.    Denies any nausea vomiting syncope bleeding All other systems reviewed and are negative.   Labs/Other Tests and Data Reviewed:    Recent Labs: No results found for requested labs within last 8760 hours.   Recent Lipid Panel Lab Results  Component Value Date/Time   CHOL 173 06/05/2017 01:29 AM   TRIG 272 (H) 06/05/2017 01:29 AM   HDL 33 (L) 06/05/2017 01:29 AM   CHOLHDL 5.2 06/05/2017 01:29 AM   LDLCALC 86 06/05/2017 01:29 AM    Wt Readings from Last 3 Encounters:  03/02/19 245 lb (111.1 kg)  02/06/19 246 lb 6.4 oz (111.8 kg)  01/04/19 241 lb 10 oz  (109.6 kg)     Exam:    Vital Signs:  Ht 5\' 4"  (1.626 m)    Wt 245 lb (111.1 kg)    LMP  (LMP Unknown)    BMI 42.05 kg/m    Well nourished, well developed female in no acute distress.  Appears to have normal respiratory effort.  Comfortable.  Alert and oriented x3 in no acute distress.  Overweight.   ASSESSMENT & PLAN:    Chest pain  -Certainly could be related to fibromyalgia, anxieties.  She has had this worked up in the past by cardiology in [place].  Nuclear stress test at that time was reassuring.  Multiple EKGs have been reassuring.  Prior ER visits have also been reassuring with normal troponins.  In investigation of the chart she is also had a nuclear stress test here in 2016 which was felt to have potential shifting breast artifact.   I think at this point, given her strong family history and symptoms, it would make sense for [name] to go ahead and proceed with nuclear stress test at earliest date as possible.  I went through the other modalities of testing including cardiac CT which I think would be quite challenging to obtain at this point given the coronavirus as well as cardiac catheterization, invasive testing which at this time we would like to avoid unless absolutely necessary.  Like to have nitroglycerin refilled.  She had some that was expired.  This is fine we will do this.  I will also have her take an 81 mg of aspirin.  Her last LDL was 80.  Morbid obesity -Continue to encourage weight loss.  We discussed impact on heart.  Depression/anxiety/fibromyalgia - Could possibly be playing an impact on her symptoms as well.  For instance anxiety surrounding the situation of her chest discomfort could have triggered her tachycardia.    COVID-19 Education: The signs and symptoms of COVID-19 were discussed with the patient and how to seek care for testing (follow up with PCP or arrange E-visit).  The importance of social distancing was discussed today.  Patient Risk:   After  full review of this patients clinical status, I feel that they are at least moderate risk at this time.  Time:   Today, I have spent  20 minutes with the patient with telehealth technology discussing her health care.     Medication Adjustments/Labs and Tests Ordered: Current medicines are reviewed at length with the patient today.  Concerns regarding medicines are outlined above.  Tests Ordered: No orders of the defined types were placed in this encounter.  Medication Changes: No orders of the defined types were placed in this encounter.  We will follow-up with results of testing.  She knows to contact us if any further worrisome symptoms develop.  Disposition:  in 3 month(s)  Signed, Candee Furbish, MD  03/02/2019 2:16 PM    [facility]

## 2019-03-02 NOTE — Telephone Encounter (Signed)
Pt called - questions answered - virtual appt was completed with Dr Marlou Porch.

## 2019-03-02 NOTE — Telephone Encounter (Signed)
Follow up:    Patient calling concerning appt. Please call patient.

## 2019-03-03 ENCOUNTER — Other Ambulatory Visit: Payer: Self-pay

## 2019-03-03 ENCOUNTER — Ambulatory Visit (HOSPITAL_COMMUNITY)
Admission: RE | Admit: 2019-03-03 | Discharge: 2019-03-03 | Disposition: A | Discharge status: Home / Self Care | Payer: Commercial Managed Care - HMO | Source: Ambulatory Visit | Attending: Cardiovascular Disease | Admitting: Cardiovascular Disease

## 2019-03-03 DIAGNOSIS — R079 Chest pain, unspecified: Secondary | ICD-10-CM | POA: Insufficient documentation

## 2019-03-03 DIAGNOSIS — Z8249 Family history of ischemic heart disease and other diseases of the circulatory system: Secondary | ICD-10-CM | POA: Diagnosis not present

## 2019-03-03 LAB — MYOCARDIAL PERFUSION IMAGING
CHL CUP NUCLEAR SRS: 2
CSEPPHR: 98 {beats}/min
LV sys vol: 75 mL
LVDIAVOL: 29 mL (ref 46–106)
Rest HR: 73 {beats}/min
SDS: 0
SSS: 2
TID: 1.19

## 2019-03-03 MED ORDER — REGADENOSON 0.4 MG/5ML IV SOLN
0.4000 mg | Freq: Once | INTRAVENOUS | Status: AC
  Administered 2019-03-03: 0.4 mg via INTRAVENOUS

## 2019-03-03 MED ORDER — AMINOPHYLLINE 25 MG/ML IV SOLN
75.0000 mg | Freq: Once | INTRAVENOUS | Status: AC
  Administered 2019-03-03: 75 mg via INTRAVENOUS

## 2019-03-03 MED ORDER — TECHNETIUM TC 99M TETROFOSMIN IV KIT
11.0000 | PACK | Freq: Once | INTRAVENOUS | Status: AC | PRN
  Administered 2019-03-03: 11 via INTRAVENOUS
  Filled 2019-03-03: qty 11

## 2019-03-03 MED ORDER — TECHNETIUM TC 99M TETROFOSMIN IV KIT
32.1000 | PACK | Freq: Once | INTRAVENOUS | Status: AC | PRN
  Administered 2019-03-03: 32.1 via INTRAVENOUS
  Filled 2019-03-03: qty 33

## 2019-03-07 ENCOUNTER — Ambulatory Visit (INDEPENDENT_AMBULATORY_CARE_PROVIDER_SITE_OTHER): Payer: 59 | Admitting: Licensed Clinical Social Worker

## 2019-03-07 ENCOUNTER — Encounter (HOSPITAL_COMMUNITY): Payer: Self-pay | Admitting: Licensed Clinical Social Worker

## 2019-03-07 ENCOUNTER — Other Ambulatory Visit: Payer: Self-pay

## 2019-03-07 DIAGNOSIS — F4310 Post-traumatic stress disorder, unspecified: Secondary | ICD-10-CM | POA: Diagnosis not present

## 2019-03-07 DIAGNOSIS — F900 Attention-deficit hyperactivity disorder, predominantly inattentive type: Secondary | ICD-10-CM | POA: Diagnosis not present

## 2019-03-07 NOTE — Progress Notes (Signed)
Virtual Visit via Video Note  I connected with Dawn Jordan on 03/07/19 at  3:30 PM EDT by a video enabled telemedicine application and verified that I am speaking with the correct person using two identifiers.   I discussed the limitations of evaluation and management by telemedicine and the availability of in person appointments. The patient expressed understanding and agreed to proceed.  Type of Therapy: Individual Therapy  Treatment Goals addressed: "to get the ADHD under control and process trauma"  Interventions: CBT, Motivational Interviewing, grounding and mindfulness techniques, psychoeducation  Summary: Dawn Jordan is a 53 y.o. female who presents with PTSD and ADHD Combined Presentation  Suicidal/Homicidal: No - without intent/plan  Therapist Response: Dawn Jordan met with clinician for an individual session. Dawn Jordan discussed her psychiatric symptoms, her current life events and her homework. Dawn Jordan reports she has been in a great deal of pain due to not having her medication for RA. She reports this medication cannot be administered at this time due to it being an immunosupressant and the risk of COVID-19 is too high for her to be safe. Clinician explored mood stability and interactions with others. Dawn Jordan identified that overall things have been okay. However, she identifies pain as a trigger to her mood. Clinician discussed ways to improve mood using comedy shows, music, and talking to others on the phone or facetime. Clinician discussed other coping skills including light movement, eating healthy, and rest.   Plan: Return again in 2-3 weeks.  Diagnosis:     Axis I: PTSD and ADHD Combined Presentation     I discussed the assessment and treatment plan with the patient. The patient was provided an opportunity to ask questions and all were answered. The patient agreed with the plan and demonstrated an understanding of the instructions.   The patient was advised to call back or seek an  in-person evaluation if the symptoms worsen or if the condition fails to improve as anticipated.  I provided 30 minutes of non-face-to-face time during this encounter.   [name], LCSW

## 2019-03-10 ENCOUNTER — Telehealth: Payer: Self-pay

## 2019-03-10 NOTE — Telephone Encounter (Signed)
Mychart message sent to assess interest in doing virtual awv. Awaiting reply.

## 2019-03-23 ENCOUNTER — Other Ambulatory Visit: Payer: Self-pay

## 2019-03-23 ENCOUNTER — Encounter (HOSPITAL_COMMUNITY): Payer: Self-pay | Admitting: Licensed Clinical Social Worker

## 2019-03-23 ENCOUNTER — Ambulatory Visit (INDEPENDENT_AMBULATORY_CARE_PROVIDER_SITE_OTHER): Payer: 59 | Admitting: Licensed Clinical Social Worker

## 2019-03-23 DIAGNOSIS — F900 Attention-deficit hyperactivity disorder, predominantly inattentive type: Secondary | ICD-10-CM

## 2019-03-23 DIAGNOSIS — F4310 Post-traumatic stress disorder, unspecified: Secondary | ICD-10-CM | POA: Diagnosis not present

## 2019-03-23 NOTE — Progress Notes (Signed)
Virtual Visit via Telephone Note  I connected with Dawn Jordan on 03/23/19 at  2:30 PM EDT by telephone and verified that I am speaking with the correct person using two identifiers.   I discussed the limitations, risks, security and privacy concerns of performing an evaluation and management service by telephone and the availability of in person appointments. I also discussed with the patient that there may be a patient responsible charge related to this service. The patient expressed understanding and agreed to proceed.   Type of Therapy: Individual Therapy   Treatment Goals addressed: "to get the ADHD under control and process trauma"   Interventions: CBT, Motivational Interviewing, grounding and mindfulness techniques, psychoeducation   Summary: Dawn Jordan is a 53 y.o. female who presents with PTSD and ADHD Combined Presentation   Suicidal/Homicidal: No - without intent/plan   Therapist Response: Dawn Jordan met with clinician for an individual session. Dawn Jordan discussed her psychiatric symptoms, her current life events and her homework. Dawn Jordan reports she has been continuing to struggle with pain, as her RA medication is an immunosuppressant. She reports that due to this, she is sleeping a lot in order to manage pain. She reports she does not want to take a lot of pain meds, as they make her feel woozy and she would just end up sleeping anyway. Clinician discussed anxiety and noted some difficulty sleeping due to racing thoughts. Clinician processed those thoughts and discussed some thought stopping techniques that may be helpful. Dawn Jordan discussed concerns about her mother, who is in a nursing home and cannot come out and Dawn Jordan cannot come in. Clinician processed those feelings of helplessness in that situation.    Plan: Return again in 3-4 weeks.   Diagnosis: Axis I: PTSD and ADHD Combined Presentation   I discussed the assessment and treatment plan with the patient. The patient was provided an  opportunity to ask questions and all were answered. The patient agreed with the plan and demonstrated an understanding of the instructions.   The patient was advised to call back or seek an in-person evaluation if the symptoms worsen or if the condition fails to improve as anticipated.  I provided 30 minutes of non-face-to-face time during this encounter.   [name], LCSW

## 2019-03-30 ENCOUNTER — Ambulatory Visit (HOSPITAL_COMMUNITY): Payer: Medicare Other | Admitting: Psychiatry

## 2019-03-30 DIAGNOSIS — F900 Attention-deficit hyperactivity disorder, predominantly inattentive type: Secondary | ICD-10-CM

## 2019-03-30 DIAGNOSIS — F331 Major depressive disorder, recurrent, moderate: Secondary | ICD-10-CM
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
## 2019-04-05 DIAGNOSIS — M2550 Pain in unspecified joint: Secondary | ICD-10-CM | POA: Diagnosis not present

## 2019-04-05 DIAGNOSIS — M797 Fibromyalgia: Secondary | ICD-10-CM | POA: Diagnosis not present

## 2019-04-05 DIAGNOSIS — Z79899 Other long term (current) drug therapy: Secondary | ICD-10-CM | POA: Diagnosis not present

## 2019-04-05 DIAGNOSIS — M0609 Rheumatoid arthritis without rheumatoid factor, multiple sites: Secondary | ICD-10-CM | POA: Diagnosis not present

## 2019-04-10 ENCOUNTER — Other Ambulatory Visit: Payer: Self-pay

## 2019-04-10 MED ORDER — LEVOCETIRIZINE DIHYDROCHLORIDE 5 MG PO TABS: 5.0000 mg | ORAL_TABLET | Freq: Every evening | ORAL | 0 refills | Status: DC

## 2019-04-10 NOTE — Telephone Encounter (Signed)
Patient last seen 12/2018 and due for follow up 2 months from then. I am sending in 1 courtesy refill until patient is seen via telehealth or in office visit.

## 2019-04-12 ENCOUNTER — Encounter (HOSPITAL_COMMUNITY): Payer: Self-pay | Admitting: Licensed Clinical Social Worker

## 2019-04-12 ENCOUNTER — Ambulatory Visit (INDEPENDENT_AMBULATORY_CARE_PROVIDER_SITE_OTHER): Payer: 59 | Admitting: Licensed Clinical Social Worker

## 2019-04-12 ENCOUNTER — Other Ambulatory Visit: Payer: Self-pay

## 2019-04-12 DIAGNOSIS — F900 Attention-deficit hyperactivity disorder, predominantly inattentive type: Secondary | ICD-10-CM | POA: Diagnosis not present

## 2019-04-12 DIAGNOSIS — F4310 Post-traumatic stress disorder, unspecified: Secondary | ICD-10-CM

## 2019-04-12 NOTE — Progress Notes (Signed)
Virtual Visit via Video Note  I connected with Dawn Jordan on 04/12/19 at  2:30 PM EDT by a video enabled telemedicine application and verified that I am speaking with the correct person using two identifiers.    I discussed the limitations of evaluation and management by telemedicine and the availability of in person appointments. The patient expressed understanding and agreed to proceed.  Type of Therapy: Individual Therapy   Treatment Goals addressed: "to get the ADHD under control and process trauma"   Interventions: CBT, Motivational Interviewing, grounding and mindfulness techniques, psychoeducation   Summary: Dawn Jordan is a 53 y.o. female who presents with PTSD and ADHD Combined Presentation   Suicidal/Homicidal: No - without intent/plan   Therapist Response: Dawn Jordan met with clinician for an individual session. Dawn Jordan discussed her psychiatric symptoms, her current life events and her homework Dawn Jordan reports concern about Dawn Jordan opening back up so soon. She reports feeling concerned about her health, but more importantly her mother's health. Clinician processed these concerns and noted the hope that people will remain socially distant and they will stay safe. Clinician identified the importance of Dawn Jordan maintaining her safe distance from others in order to make sure she is healthy and able to keep doing what she does for mom and wife. Clinician discussed Dawn Jordan frustration with her brother and other people who are very conservative. Clinician encouraged Dawn Jordan to focus on what she can control and to limit the impact of those people on her daily life.   Plan: Return again in 3-4 weeks.   Diagnosis: Axis I: PTSD and ADHD Combined Presentation  I discussed the assessment and treatment plan with the patient. The patient was provided an opportunity to ask questions and all were answered. The patient agreed with the plan and demonstrated an understanding of the instructions.   The patient was advised  to call back or seek an in-person evaluation if the symptoms worsen or if the condition fails to improve as anticipated.  I provided 45 minutes of non-face-to-face time during this encounter.   [name], LCSW

## 2019-04-20 ENCOUNTER — Telehealth: Payer: Self-pay | Admitting: *Deleted

## 2019-04-20 ENCOUNTER — Encounter: Payer: Self-pay | Admitting: Family Medicine

## 2019-04-20 ENCOUNTER — Ambulatory Visit (INDEPENDENT_AMBULATORY_CARE_PROVIDER_SITE_OTHER): Payer: Commercial Managed Care - HMO | Admitting: Family Medicine

## 2019-04-20 ENCOUNTER — Other Ambulatory Visit: Payer: Self-pay

## 2019-04-20 DIAGNOSIS — R2231 Localized swelling, mass and lump, right upper limb: Secondary | ICD-10-CM | POA: Diagnosis not present

## 2019-04-20 DIAGNOSIS — M79601 Pain in right arm: Secondary | ICD-10-CM

## 2019-04-20 NOTE — Patient Instructions (Addendum)
-  We placed a referral for you as discussed to a specialist about the arm issue. It usually takes about 1-2 weeks to process and schedule this referral. If you have not heard from [name] regarding this appointment in 2 weeks please contact our office.  I hope you are feeling better soon! Seek care promptly if your symptoms worsen, new concerns arise or you are not improving with treatment.

## 2019-04-20 NOTE — Progress Notes (Signed)
Virtual Visit via Video Note  I connected with Dawn Jordan   on 04/20/19 at  1:20 PM EDT by a video enabled telemedicine application and verified that I am speaking with the correct person using two identifiers.  Location patient: car. [place] Location provider:work or home office Persons participating in the virtual visit: patient, provider  I discussed the limitations of evaluation and management by telemedicine and the availability of in person appointments. The patient expressed understanding and agreed to proceed.   HPI:  Acute visit for a "cyst": -has confirmed cyst in upper R arm- reports confirmed on MRI about 7 years ago and reports was told is not "cancer or anything" but was told to schedule surgery for removal -feels like possibly has gotten larger over time, some soreness in this area - this has been her > 7 years and uncomfortable for "quite some time" -has hx of cysts removed from her hand -feels like she may have another cyst on her upper arm as well -denies: fevers, malaise, unexplained weight loss -she [facility] for [facility] issues and tendinitis in the R arm and reports had inj in the arm recently and told the ortho doc about the cyst. Reports was told Dr. Maureen Ralphs could take it out when she gets her knee surgery, but reports Dr. Maureen Ralphs told her that she needed to see her PCP for a separate referral for this. PCP is not available today so she was placed on our acute telemedicine schedule today.  ROS: See pertinent positives and negatives per HPI.  Past Medical History:  Diagnosis Date  . Anemia   . Anginal pain ([place])   . Anxiety   . Asthma   . Chronic fatigue syndrome   . COPD (chronic obstructive pulmonary disease) ([place])   . Depression   . Fibromyalgia   . GERD (gastroesophageal reflux disease)   . Headache    "weekly-monthly" (06/04/2017)  . History of gout   . History of kidney stones   . Left sciatic nerve pain   . OSA (obstructive sleep apnea)    "getting ready to  retest" (06/04/2017)  . Rheumatoid arthritis ([place])   . Seasonal allergies     Past Surgical History:  Procedure Laterality Date  . CARPAL TUNNEL RELEASE Left   . CYST EXCISION     "little cysts taken off both hands and left arm" (06/04/2017)  . KNEE ARTHROSCOPY Bilateral    "3 on my left; 2 on my right" (06/04/2017)  . LAPAROSCOPIC CHOLECYSTECTOMY    . TONSILLECTOMY      Family History  Problem Relation Age of Onset  . Arthritis Mother   . Hyperlipidemia Mother   . Hypertension Mother   . Stroke Mother   . Mental retardation Mother   . Diabetes Mother   . Allergic rhinitis Mother   . Diabetes Maternal Grandfather   . Hypertension Maternal Grandfather   . Diabetes Paternal Grandmother   . Hypertension Paternal Grandmother   . Cancer Paternal Grandmother        breast  . Allergic rhinitis Brother   . Colon cancer Paternal Aunt   . Lung cancer Other   . Lung cancer Maternal Uncle   . Asthma Neg Hx   . Eczema Neg Hx   . Immunodeficiency Neg Hx   . Urticaria Neg Hx   . Atopy Neg Hx   . Angioedema Neg Hx   . Esophageal cancer Neg Hx   . Stomach cancer Neg Hx   . Rectal  cancer Neg Hx     SOCIAL HX: see hpi   Current Outpatient Medications:  .  acetaminophen (TYLENOL) 500 MG tablet, Take 1 tablet (500 mg total) by mouth every 6 (six) hours as needed., Disp: 30 tablet, Rfl: 0 .  ACTEMRA 162 MG/0.9ML SOSY, , Disp: , Rfl:  .  albuterol (PROVENTIL) (2.5 MG/3ML) 0.083% nebulizer solution, Please specify directions, refills and quantity, Disp: 120 mL, Rfl: 1 .  aspirin EC 81 MG tablet, Take 1 tablet (81 mg total) by mouth daily., Disp: 90 tablet, Rfl: 3 .  budesonide-formoterol (SYMBICORT) 160-4.5 MCG/ACT inhaler, TAKE 2 PUFFS BY MOUTH TWICE A DAY, Disp: 3 Inhaler, Rfl: 0 .  clonazePAM (KLONOPIN) 0.5 MG tablet, Take 0.5 tablets (0.25 mg total) by mouth 2 (two) times daily as needed for anxiety., Disp: 30 tablet, Rfl: 2 .  cyclobenzaprine (FLEXERIL) 10 MG tablet, Take 1 tablet  (10 mg total) by mouth 2 (two) times daily as needed for muscle spasms., Disp: 20 tablet, Rfl: 0 .  diclofenac sodium (VOLTAREN) 1 % GEL, Apply 1 application topically 2 (two) times daily as needed (back and knee pain). , Disp: , Rfl:  .  EPINEPHrine (EPIPEN 2-PAK) 0.3 mg/0.3 mL IJ SOAJ injection, Use for life threatening allergic reactions, Disp: 2 Device, Rfl: 6 .  HYDROcodone-acetaminophen (NORCO/VICODIN) 5-325 MG tablet, Take 1 tablet by mouth 2 (two) times daily as needed., Disp: , Rfl:  .  levocetirizine (XYZAL) 5 MG tablet, Take 1 tablet (5 mg total) by mouth every evening., Disp: 30 tablet, Rfl: 0 .  lithium carbonate (LITHOBID) 300 MG CR tablet, Take 2 tablets (600 mg total) by mouth daily., Disp: 60 tablet, Rfl: 2 .  Loperamide HCl (IMODIUM PO), Take by mouth as needed., Disp: , Rfl:  .  methylphenidate (RITALIN) 20 MG tablet, Take 1 tablet (20 mg total) by mouth 2 (two) times daily with breakfast and lunch., Disp: 60 tablet, Rfl: 0 .  montelukast (SINGULAIR) 10 MG tablet, Take 1 tablet (10 mg total) by mouth at bedtime., Disp: 30 tablet, Rfl: 5 .  nabumetone (RELAFEN) 500 MG tablet, Take 500 mg by mouth 3 (three) times daily., Disp: , Rfl: 3 .  nitroGLYCERIN (NITROSTAT) 0.4 MG SL tablet, Place 1 tablet (0.4 mg total) under the tongue every 5 (five) minutes as needed for chest pain., Disp: 25 tablet, Rfl: 6 .  omeprazole (PRILOSEC) 40 MG capsule, TAKE 1 CAPSULE EVERY DAY 30 min BEFORE BREAKFAST, Disp: 90 capsule, Rfl: 0 .  pregabalin (LYRICA) 200 MG capsule, Take 200 mg by mouth 2 (two) times daily. , Disp: , Rfl:  .  albuterol (PROVENTIL HFA;VENTOLIN HFA) 108 (90 Base) MCG/ACT inhaler, Inhale 2 puffs into the lungs every 4 (four) hours as needed for up to 30 days for wheezing or shortness of breath., Disp: 1 Inhaler, Rfl: 3  Current Facility-Administered Medications:  .  0.9 %  sodium chloride infusion, 500 mL, Intravenous, Continuous, Milus Banister, MD  EXAM:  VITALS per patient if  applicable: none  GENERAL: alert, oriented, appears well and in no acute distress  HEENT: atraumatic, conjunttiva clear, no obvious abnormalities on inspection of external nose and ears  NECK: normal movements of the head and neck  LUNGS: on inspection no signs of respiratory distress, breathing rate appears normal, no obvious gross SOB, gasping or wheezing  CV: no obvious cyanosis  MS: moves all visible extremities without noticeable abnormality, not the best resolution, but do not appreciate any obvious mass visually I the  are of concern on the medial upper R arm just above the antecubital region of the elbow. She reports ome TTP here. No redness or discoloration. Normal ROM of the elbow.  PSYCH/NEURO: pleasant and cooperative, no obvious depression or anxiety, speech and thought processing grossly intact  ASSESSMENT AND PLAN:  Discussed the following assessment and plan:  Pain of right upper extremity - Plan: Ambulatory referral to Orthopedic Surgery  Arm mass, right - Plan: Ambulatory referral to Orthopedic Surgery  referral to specialist as requested for evaluation and possible removal. Advised to call our office in 1 week if does not have details of that referral.  Follow up with PCP in 1-2 months.   I discussed the assessment and treatment plan with the patient. The patient was provided an opportunity to ask questions and all were answered. The patient agreed with the plan and demonstrated an understanding of the instructions.   The patient was advised to call back or seek an in-person evaluation if the symptoms worsen or if the condition fails to improve as anticipated.   Follow up instructions: Advised assistant Wendie Simmer to help patient arrange the following: -follow up with PCP in 1-2 months  Lucretia Kern, DO

## 2019-04-20 NOTE — Telephone Encounter (Signed)
-----   Message from Lucretia Kern, DO sent at 04/20/2019  1:27 PM EDT ----- -follow up with PCP in 1-2 months

## 2019-04-20 NOTE — Telephone Encounter (Signed)
I left a detailed message at the pts cell number to call the office and schedule an appt as below.

## 2019-04-22 ENCOUNTER — Other Ambulatory Visit (HOSPITAL_COMMUNITY): Payer: Self-pay | Admitting: Psychiatry

## 2019-04-22 DIAGNOSIS — F331 Major depressive disorder, recurrent, moderate: Secondary | ICD-10-CM

## 2019-04-26 ENCOUNTER — Ambulatory Visit (HOSPITAL_COMMUNITY): Payer: Commercial Managed Care - HMO | Admitting: Licensed Clinical Social Worker

## 2019-04-27 DIAGNOSIS — M79601 Pain in right arm: Secondary | ICD-10-CM | POA: Diagnosis not present

## 2019-04-27 DIAGNOSIS — M25521 Pain in right elbow: Secondary | ICD-10-CM | POA: Diagnosis not present

## 2019-05-04 ENCOUNTER — Other Ambulatory Visit: Payer: Self-pay | Admitting: *Deleted

## 2019-05-04 DIAGNOSIS — K219 Gastro-esophageal reflux disease without esophagitis: Secondary | ICD-10-CM

## 2019-05-04 MED ORDER — OMEPRAZOLE 40 MG PO CPDR: DELAYED_RELEASE_CAPSULE | ORAL | 0 refills | Status: DC

## 2019-05-08 DIAGNOSIS — M25521 Pain in right elbow: Secondary | ICD-10-CM | POA: Diagnosis not present

## 2019-05-10 ENCOUNTER — Other Ambulatory Visit: Payer: Self-pay

## 2019-05-10 ENCOUNTER — Ambulatory Visit (INDEPENDENT_AMBULATORY_CARE_PROVIDER_SITE_OTHER): Payer: Medicare Other | Admitting: Licensed Clinical Social Worker

## 2019-05-10 DIAGNOSIS — Z91199 Patient's noncompliance with other medical treatment and regimen due to unspecified reason: Secondary | ICD-10-CM

## 2019-05-10 DIAGNOSIS — Z5329 Procedure and treatment not carried out because of patient's decision for other reasons: Secondary | ICD-10-CM

## 2019-05-10 NOTE — Progress Notes (Signed)
Client no showed appointment 

## 2019-05-11 ENCOUNTER — Encounter (HOSPITAL_COMMUNITY): Payer: Self-pay | Admitting: Psychiatry

## 2019-05-11 ENCOUNTER — Other Ambulatory Visit: Payer: Self-pay

## 2019-05-11 ENCOUNTER — Ambulatory Visit (INDEPENDENT_AMBULATORY_CARE_PROVIDER_SITE_OTHER): Payer: 59 | Admitting: Psychiatry

## 2019-05-11 DIAGNOSIS — F900 Attention-deficit hyperactivity disorder, predominantly inattentive type: Secondary | ICD-10-CM | POA: Diagnosis not present

## 2019-05-11 DIAGNOSIS — F331 Major depressive disorder, recurrent, moderate: Secondary | ICD-10-CM

## 2019-05-11 DIAGNOSIS — F4310 Post-traumatic stress disorder, unspecified: Secondary | ICD-10-CM

## 2019-05-11 MED ORDER — METHYLPHENIDATE HCL 20 MG PO TABS: 20.0000 mg | ORAL_TABLET | Freq: Two times a day (BID) | ORAL | 0 refills | Status: DC

## 2019-05-11 MED ORDER — CLONAZEPAM 0.5 MG PO TABS: 0.5000 mg | ORAL_TABLET | Freq: Three times a day (TID) | ORAL | 2 refills | Status: DC | PRN

## 2019-05-11 MED ORDER — LITHIUM CARBONATE 150 MG PO CAPS: 150.0000 mg | ORAL_CAPSULE | Freq: Every day | ORAL | 2 refills | Status: DC

## 2019-05-11 NOTE — Progress Notes (Signed)
Virtual Visit via Telephone Note  I connected with [name] on 05/11/19 at  1:45 PM EDT by telephone and verified that I am speaking with the correct person using two identifiers.  Location: Patient: Home Provider: Office   I discussed the limitations, risks, security and privacy concerns of performing an evaluation and management service by telephone and the availability of in person appointments. I also discussed with the patient that there may be a patient responsible charge related to this service. The patient expressed understanding and agreed to proceed.   History of Present Illness: Zimal reports that her anxiety is significantly increased.  [place] in [place].  People walk through her apartment complex to protest sites.  She is having a particularly difficult time at night and is unable to sleep.  She states that her anxiety keeps her up.  She usually cannot fall asleep until at least 3 AM.  She has been taking her Klonopin which does help.  Her PTSD is heightened.  She has turned on a police scanner to keep informed of the news as she is very worried about her safety and the safety of others around her.  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  Shaquilla states that her anxiety has been more of an issue than her depression.  She really has not been focusing on her depression and denies SI/HI.   Observations/Objective: Evianna is calm and cooperative on the phone today.  She was engaged in the conversation and answered questions appropriately.  Her speech was clear and coherent with normal tone, volume and rate.  Mood is anxious and affect is congruent.  Thought content is logical.  Thought processes are linear, goal directed and intact.  She denies SI/HI.  She denies AVH and did not appear to be responding to internal stimuli.   Her use of language and fund of knowledge are average.  Attention and memory are good.  Insight and judgment are good.  I am unable to comment on her general appearance, eye contact or psychomotor activity as I was unable to physically see the patient.  Assessment and Plan: PTSD; ADHD- inattentive type;  MDD- recurrent, moderate  Klonopin 0.5mg  po TID prn anxiety Ritalin 20mg  po qAM and 20mg  qNoon for ADHD Lithobid 150mg  po qD for MDD  Follow Up Instructions: In 6-8 weeks or sooner if needed   I discussed the assessment and treatment plan with the patient. The patient was provided an opportunity to ask questions and all were answered. The patient agreed with the plan and demonstrated an understanding of the instructions.   The patient was advised to call back or seek an in-person evaluation if the symptoms worsen or if the condition fails to improve as anticipated.  I provided 35 minutes of non-face-to-face time during this encounter.   Charlcie Cradle, MD

## 2019-05-16 ENCOUNTER — Telehealth: Payer: Self-pay | Admitting: Cardiology

## 2019-05-16 DIAGNOSIS — Z01812 Encounter for preprocedural laboratory examination: Secondary | ICD-10-CM

## 2019-05-16 DIAGNOSIS — Z8249 Family history of ischemic heart disease and other diseases of the circulatory system: Secondary | ICD-10-CM

## 2019-05-16 DIAGNOSIS — R079 Chest pain, unspecified: Secondary | ICD-10-CM

## 2019-05-16 DIAGNOSIS — M25521 Pain in right elbow: Secondary | ICD-10-CM | POA: Diagnosis not present

## 2019-05-16 DIAGNOSIS — R2231 Localized swelling, mass and lump, right upper limb: Secondary | ICD-10-CM | POA: Diagnosis not present

## 2019-05-16 NOTE — Telephone Encounter (Signed)
Called to speak with patient regarding her s/s. She is reporting she has been waking at night with chest pain in the center of her chest that radiates to her neck, arm and back.  This has happened approximately 5 times recently during sleep (usually when sleeping on her left side) and once when she was at a store in the check out line.  She has a fast HR and feels SOB.  Denies N/V during the episodes but has noted some nausea after.  She reports using SL Ntg, usually taking 2 and a baby ASA before she can obtain relief.  She reports having a history of panic attacks that initially started out with a choking feeling and dry mouth and later progressed to chest pain.  She also has a history of nerve damage in her neck and wonders if this could be the cause of the pain.  She is presently laying down, has slurred speech and it is difficult to understand everything she is saying. She denies any active chest pain and does not ant to go to the ED for further evaluation. She is asking if she should come into the office for further evaluation before her scheduled appointment at the end of June.  Advised I will have Dr Marlou Porch to review the above information.  Reassurance given r/t the recent nuclear stress testing she had in March.  She reports she is to go for lab work at her PCP office later this week though I am not able to understand whether she is scheduled to see her or not. Advised I will forward this information to Dr Marlou Porch and call her back once it has been reviewed with recommendations/orders.  She states understanding.

## 2019-05-16 NOTE — Telephone Encounter (Signed)
Patient called stating she wakes up in the middle of the night with chest pain, headache, and back pain, she takes two nitro, and baby asprin and an ativan, and states her BP and her pulse has been up.  This has happened about 5 times in the past two months.   Sitting 155/108 HR 153when she was feeling good   145/98    150/100  She has appt on 6/29, she is wondering if she needs to be seen sooner.

## 2019-05-17 ENCOUNTER — Encounter: Payer: Self-pay | Admitting: *Deleted

## 2019-05-17 MED ORDER — METOPROLOL TARTRATE 100 MG PO TABS: 100.0000 mg | ORAL_TABLET | Freq: Once | ORAL | 0 refills | Status: DC

## 2019-05-17 NOTE — Telephone Encounter (Signed)
Thanks for the update. Given ongoing chest pain and other symptoms despite low risk NUC stress test, I think it would be helpful for her to get a coronary CT with possible FFR to define her coronary anatomy.  Please order. Agree that Dr. Martinique, her PCP, should also be aware.  Thanks Candee Furbish, MD

## 2019-05-17 NOTE — Telephone Encounter (Signed)
Spoke with patient who is aware of the order for Coronary CT scan.  Reviewed all instructions with her via phone.  Aware she will receive a copy of instructions once test as been scheduled.  Aware testing needs to be precerted prior to scheduling. Aware she will be called to schedule once precert has been obtained.  She denies any questions at this time.

## 2019-05-19 ENCOUNTER — Telehealth: Payer: Self-pay | Admitting: *Deleted

## 2019-05-19 DIAGNOSIS — R002 Palpitations: Secondary | ICD-10-CM

## 2019-05-19 DIAGNOSIS — Z79899 Other long term (current) drug therapy: Secondary | ICD-10-CM | POA: Diagnosis not present

## 2019-05-19 DIAGNOSIS — M797 Fibromyalgia: Secondary | ICD-10-CM | POA: Diagnosis not present

## 2019-05-19 DIAGNOSIS — M0609 Rheumatoid arthritis without rheumatoid factor, multiple sites: Secondary | ICD-10-CM | POA: Diagnosis not present

## 2019-05-19 DIAGNOSIS — M2550 Pain in unspecified joint: Secondary | ICD-10-CM | POA: Diagnosis not present

## 2019-05-19 NOTE — Telephone Encounter (Signed)
Patient has been informed that a 14 day monitor has been ordered and that she will be receiving a call regarding scheduling to come in to have monitor placed or it will be mailed to her home.  Aware this will be to monitor her heart rate/symptoms and any correlation.  Thanked me for call.

## 2019-05-19 NOTE — Telephone Encounter (Signed)
-----   Message from Jerline Pain, MD sent at 05/18/2019  6:17 PM EDT ----- Please set her up for Zio patch monitor - palpitations.  Thanks Candee Furbish, MD ----- Message ----- From: Shellia Cleverly, RN Sent: 05/17/2019   8:40 AM EDT To: Jerline Pain, MD  Could she be having SVT or At Fib? With her c/o rapid HR or do you think that's more from anxiety/panic attacks?

## 2019-05-23 ENCOUNTER — Telehealth: Payer: Self-pay | Admitting: Cardiology

## 2019-05-23 ENCOUNTER — Telehealth: Payer: Self-pay | Admitting: *Deleted

## 2019-05-23 ENCOUNTER — Other Ambulatory Visit: Payer: Self-pay | Admitting: *Deleted

## 2019-05-23 DIAGNOSIS — I209 Angina pectoris, unspecified: Secondary | ICD-10-CM

## 2019-05-23 NOTE — Telephone Encounter (Signed)
14 day [employer]ice long term holter monitor will be shipped to patients home.  Preventice chosen over ZIO because patient is also scheduled for a CT.  Patient would not be able to wear her monitor during the CT.  The ZIO patch is a single patch monitor which is not removable during the recording period.  Pre[employer]ice offers a multiple patch monitor,which, allows patient to remove for another test, and reapply after.  Instructions reviewed briefly with patient as they are also included in her monitor kit.

## 2019-05-23 NOTE — Telephone Encounter (Signed)
New Message   Patient is calling in reference to the Long Term Monitor (3-14 days). Please advise. Patient is wanting to set up the appointment.

## 2019-05-31 ENCOUNTER — Ambulatory Visit (INDEPENDENT_AMBULATORY_CARE_PROVIDER_SITE_OTHER): Payer: 59

## 2019-05-31 DIAGNOSIS — R002 Palpitations: Secondary | ICD-10-CM

## 2019-06-03 ENCOUNTER — Other Ambulatory Visit (HOSPITAL_COMMUNITY): Payer: Self-pay | Admitting: Psychiatry

## 2019-06-03 DIAGNOSIS — F331 Major depressive disorder, recurrent, moderate: Secondary | ICD-10-CM

## 2019-06-05 ENCOUNTER — Telehealth (HOSPITAL_COMMUNITY): Payer: Self-pay | Admitting: Emergency Medicine

## 2019-06-05 ENCOUNTER — Ambulatory Visit: Payer: Self-pay | Admitting: Cardiology

## 2019-06-05 NOTE — Telephone Encounter (Signed)
Reaching out to patient to offer assistance regarding upcoming cardiac imaging study; pt verbalizes understanding of appt date/time, parking situation and where to check in, pre-test NPO status and medications ordered, and verified current allergies; name and call back number provided for further questions should they arise Dawn Bond RN [facility] [phone] office [phone] cell  Pt denies covid symptoms, verbalized understanding of visitor policy.  Pt had labs done in another office, will bring copy for test

## 2019-06-07 ENCOUNTER — Ambulatory Visit (HOSPITAL_COMMUNITY): Admission: RE | Admit: 2019-06-07 | Payer: 59 | Source: Ambulatory Visit

## 2019-06-07 ENCOUNTER — Other Ambulatory Visit: Payer: Self-pay

## 2019-06-07 ENCOUNTER — Ambulatory Visit (HOSPITAL_COMMUNITY)
Admission: RE | Admit: 2019-06-07 | Discharge: 2019-06-07 | Disposition: A | Discharge status: Home / Self Care | Payer: 59 | Source: Ambulatory Visit | Attending: Cardiology | Admitting: Cardiology

## 2019-06-07 DIAGNOSIS — I209 Angina pectoris, unspecified: Secondary | ICD-10-CM | POA: Diagnosis not present

## 2019-06-07 MED ORDER — IOHEXOL 350 MG/ML SOLN
100.0000 mL | Freq: Once | INTRAVENOUS | Status: AC | PRN
  Administered 2019-06-07: 100 mL via INTRAVENOUS

## 2019-06-07 MED ORDER — NITROGLYCERIN 0.4 MG SL SUBL
0.8000 mg | SUBLINGUAL_TABLET | Freq: Once | SUBLINGUAL | Status: AC
  Administered 2019-06-07: 0.8 mg via SUBLINGUAL
  Filled 2019-06-07: qty 25

## 2019-06-07 MED ORDER — NITROGLYCERIN 0.4 MG SL SUBL
SUBLINGUAL_TABLET | SUBLINGUAL | Status: AC
  Filled 2019-06-07: qty 2

## 2019-06-16 ENCOUNTER — Telehealth: Payer: Self-pay | Admitting: Cardiology

## 2019-06-16 NOTE — Telephone Encounter (Signed)
error 

## 2019-06-19 ENCOUNTER — Ambulatory Visit: Payer: 59 | Admitting: Cardiology

## 2019-06-26 ENCOUNTER — Ambulatory Visit (INDEPENDENT_AMBULATORY_CARE_PROVIDER_SITE_OTHER): Payer: 59 | Admitting: Licensed Clinical Social Worker

## 2019-06-26 ENCOUNTER — Encounter (HOSPITAL_COMMUNITY): Payer: Self-pay | Admitting: Licensed Clinical Social Worker

## 2019-06-26 DIAGNOSIS — F900 Attention-deficit hyperactivity disorder, predominantly inattentive type: Secondary | ICD-10-CM

## 2019-06-26 DIAGNOSIS — F4310 Post-traumatic stress disorder, unspecified: Secondary | ICD-10-CM | POA: Diagnosis not present
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
## 2019-06-29 ENCOUNTER — Encounter (HOSPITAL_COMMUNITY): Payer: Self-pay | Admitting: Psychiatry

## 2019-06-29 ENCOUNTER — Other Ambulatory Visit: Payer: Self-pay

## 2019-06-29 ENCOUNTER — Ambulatory Visit (INDEPENDENT_AMBULATORY_CARE_PROVIDER_SITE_OTHER): Payer: 59 | Admitting: Psychiatry

## 2019-06-29 DIAGNOSIS — I209 Angina pectoris, unspecified: Secondary | ICD-10-CM | POA: Diagnosis not present

## 2019-06-29 DIAGNOSIS — F4310 Post-traumatic stress disorder, unspecified: Secondary | ICD-10-CM | POA: Diagnosis not present

## 2019-06-29 DIAGNOSIS — F331 Major depressive disorder, recurrent, moderate: Secondary | ICD-10-CM

## 2019-06-29 DIAGNOSIS — Z7982 Long term (current) use of aspirin: Secondary | ICD-10-CM

## 2019-06-29 DIAGNOSIS — F900 Attention-deficit hyperactivity disorder, predominantly inattentive type: Secondary | ICD-10-CM

## 2019-06-29 MED ORDER — LITHIUM CARBONATE 150 MG PO CAPS: ORAL_CAPSULE | ORAL | 0 refills | Status: DC

## 2019-06-29 MED ORDER — METHYLPHENIDATE HCL 20 MG PO TABS: 20.0000 mg | ORAL_TABLET | Freq: Two times a day (BID) | ORAL | 0 refills | Status: DC

## 2019-06-29 MED ORDER — VIIBRYD 20 MG PO TABS: 20.0000 mg | ORAL_TABLET | Freq: Every day | ORAL | 1 refills | Status: DC

## 2019-06-29 MED ORDER — CLONAZEPAM 0.5 MG PO TABS: 0.5000 mg | ORAL_TABLET | Freq: Three times a day (TID) | ORAL | 2 refills | Status: DC | PRN
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
## 2019-07-04 DIAGNOSIS — M4316 Spondylolisthesis, lumbar region: Secondary | ICD-10-CM | POA: Diagnosis not present

## 2019-07-04 DIAGNOSIS — Z6841 Body Mass Index (BMI) 40.0 and over, adult: Secondary | ICD-10-CM | POA: Diagnosis not present

## 2019-07-04 DIAGNOSIS — M545 Low back pain: Secondary | ICD-10-CM | POA: Diagnosis not present

## 2019-07-04 DIAGNOSIS — M5136 Other intervertebral disc degeneration, lumbar region: Secondary | ICD-10-CM | POA: Diagnosis not present

## 2019-07-07 ENCOUNTER — Telehealth: Payer: Self-pay | Admitting: *Deleted

## 2019-07-07 ENCOUNTER — Other Ambulatory Visit: Payer: Self-pay | Admitting: *Deleted

## 2019-07-07 ENCOUNTER — Telehealth: Payer: Self-pay | Admitting: Cardiology

## 2019-07-07 DIAGNOSIS — G4733 Obstructive sleep apnea (adult) (pediatric): Secondary | ICD-10-CM

## 2019-07-07 DIAGNOSIS — I4891 Unspecified atrial fibrillation: Secondary | ICD-10-CM

## 2019-07-07 MED ORDER — FLECAINIDE ACETATE 50 MG PO TABS: 50.0000 mg | ORAL_TABLET | Freq: Two times a day (BID) | ORAL | 11 refills | Status: DC

## 2019-07-07 MED ORDER — METOPROLOL SUCCINATE ER 25 MG PO TB24: 25.0000 mg | ORAL_TABLET | Freq: Every day | ORAL | 11 refills | Status: DC

## 2019-07-07 NOTE — Telephone Encounter (Signed)
New Message     Pt is returning Pams call for results     Please call

## 2019-07-07 NOTE — Telephone Encounter (Signed)
-----   Message from Earnestine Mealing sent at 07/07/2019  1:17 PM EDT ----- Pt needs a sleep study per Dr Marlou Porch ----- Message ----- From: Richmond Campbell, LPN Sent: 08/09/8332  11:58 AM EDT To: [facility]  Sleep study as well Thanks  Theatre manager

## 2019-07-07 NOTE — Telephone Encounter (Signed)
See monitor results note ./cy 

## 2019-07-10 ENCOUNTER — Telehealth (HOSPITAL_COMMUNITY): Payer: Self-pay

## 2019-07-10 NOTE — Telephone Encounter (Signed)
New messages  Call the patient to schedule an exercise tolerance test order from Dr. Marlou Porch.   The patients advise both knees are bone on bone and have lower back issues in two places.   please advise

## 2019-07-10 NOTE — Telephone Encounter (Signed)
Left message on VM to c/b to discuss GXT and her abilities.

## 2019-07-10 NOTE — Telephone Encounter (Signed)
If she is able to walk this should be OK to try the treadmill. She will not need to jog for instance.  It is done to make sure that under mildly strenuous conditions to the best of her ability, that she does not have any adverse ECG changes while taking flecainide.   If she doesn't think that she can walk on the treadmill, a 24 hour holter would be the next best alternative.  Thanks   Candee Furbish, MD

## 2019-07-10 NOTE — Telephone Encounter (Signed)
Will have Dr Marlou Porch to review.  This gxt was ordered in f/u after pt starting flecainide.

## 2019-07-11 DIAGNOSIS — M25561 Pain in right knee: Secondary | ICD-10-CM | POA: Diagnosis not present

## 2019-07-11 DIAGNOSIS — M1712 Unilateral primary osteoarthritis, left knee: Secondary | ICD-10-CM | POA: Diagnosis not present

## 2019-07-11 DIAGNOSIS — M170 Bilateral primary osteoarthritis of knee: Secondary | ICD-10-CM | POA: Diagnosis not present

## 2019-07-11 DIAGNOSIS — M25562 Pain in left knee: Secondary | ICD-10-CM | POA: Diagnosis not present

## 2019-07-11 NOTE — Telephone Encounter (Signed)
Called and spoke with patient.  Reviewed with her the need and purpose of the treadmill testing.  She is able to walk and understands she will not need to be jogging etc.  This is to check to make sure she is not having any EKG changes since being started on Flecainide and she only needs to be able to walk to the best of her ability.  Pt states understanding and is aware she will be contacted to be scheduled for the testing.

## 2019-07-12 ENCOUNTER — Other Ambulatory Visit: Payer: Self-pay | Admitting: Orthopedic Surgery

## 2019-07-12 DIAGNOSIS — M5136 Other intervertebral disc degeneration, lumbar region: Secondary | ICD-10-CM

## 2019-07-19 ENCOUNTER — Telehealth (HOSPITAL_COMMUNITY): Payer: Self-pay

## 2019-07-19 NOTE — Telephone Encounter (Signed)
Encounter complete. 

## 2019-07-20 ENCOUNTER — Other Ambulatory Visit: Payer: Self-pay | Admitting: Cardiology

## 2019-07-20 DIAGNOSIS — G4733 Obstructive sleep apnea (adult) (pediatric): Secondary | ICD-10-CM

## 2019-07-20 DIAGNOSIS — I4891 Unspecified atrial fibrillation: Secondary | ICD-10-CM

## 2019-07-21 ENCOUNTER — Other Ambulatory Visit (HOSPITAL_COMMUNITY)
Admission: RE | Admit: 2019-07-21 | Discharge: 2019-07-21 | Disposition: A | Discharge status: Home / Self Care | Payer: 59 | Source: Ambulatory Visit | Attending: Cardiology | Admitting: Cardiology

## 2019-07-21 DIAGNOSIS — Z20828 Contact with and (suspected) exposure to other viral communicable diseases: Secondary | ICD-10-CM | POA: Diagnosis not present

## 2019-07-21 DIAGNOSIS — Z01812 Encounter for preprocedural laboratory examination: Secondary | ICD-10-CM | POA: Diagnosis not present

## 2019-07-21 LAB — SARS CORONAVIRUS 2 (TAT 6-24 HRS): SARS Coronavirus 2: NEGATIVE

## 2019-07-24 NOTE — Progress Notes (Signed)
Addenedum:  I will be ordering a sleep study due to Atrial fibrillation, obesity, daytime somnolence  Candee Furbish, MD

## 2019-07-24 NOTE — Telephone Encounter (Signed)
Per Lorre Nick at [facility] [phone]) member does not qualify (split night [phone]) for the co morbidities and wants to switch to a Home Sleep Study which does not require a prior authorization.Is that ok?

## 2019-07-25 ENCOUNTER — Ambulatory Visit (HOSPITAL_COMMUNITY)
Admission: RE | Admit: 2019-07-25 | Discharge: 2019-07-25 | Disposition: A | Discharge status: Home / Self Care | Payer: 59 | Source: Ambulatory Visit | Attending: Cardiovascular Disease | Admitting: Cardiovascular Disease

## 2019-07-25 ENCOUNTER — Other Ambulatory Visit: Payer: Self-pay

## 2019-07-25 DIAGNOSIS — I4891 Unspecified atrial fibrillation: Secondary | ICD-10-CM

## 2019-07-25 LAB — EXERCISE TOLERANCE TEST
Estimated workload: 3.4 METS
Exercise duration (min): 1 min
Exercise duration (sec): 23 s
MPHR: 167 {beats}/min
Peak HR: 116 {beats}/min
Percent HR: 69 %
RPE: 20
Rest HR: 67 {beats}/min

## 2019-07-25 NOTE — Addendum Note (Signed)
Addended by: Freada Bergeron on: 07/25/2019 12:32 PM   Modules accepted: Orders

## 2019-07-25 NOTE — Telephone Encounter (Signed)
Ok with me to switch to Home Sleep study [employer]

## 2019-07-25 NOTE — Telephone Encounter (Signed)
Home sleep order placed and sent to sleep pool.

## 2019-07-26 ENCOUNTER — Other Ambulatory Visit: Payer: Self-pay | Admitting: Family Medicine

## 2019-07-26 DIAGNOSIS — K219 Gastro-esophageal reflux disease without esophagitis: Secondary | ICD-10-CM

## 2019-07-27 NOTE — Telephone Encounter (Signed)
Per Lorre Nick @[employer]  [phone] ok to schedule HST no PA required. [phone].

## 2019-07-28 ENCOUNTER — Other Ambulatory Visit: Payer: Self-pay

## 2019-07-28 ENCOUNTER — Telehealth (INDEPENDENT_AMBULATORY_CARE_PROVIDER_SITE_OTHER): Payer: 59 | Admitting: Family Medicine

## 2019-07-28 DIAGNOSIS — Z6837 Body mass index (BMI) 37.0-37.9, adult: Secondary | ICD-10-CM

## 2019-07-28 DIAGNOSIS — M797 Fibromyalgia: Secondary | ICD-10-CM

## 2019-07-28 DIAGNOSIS — I209 Angina pectoris, unspecified: Secondary | ICD-10-CM

## 2019-07-28 DIAGNOSIS — M2550 Pain in unspecified joint: Secondary | ICD-10-CM | POA: Diagnosis not present

## 2019-07-28 DIAGNOSIS — G894 Chronic pain syndrome: Secondary | ICD-10-CM

## 2019-07-28 NOTE — Progress Notes (Signed)
Virtual Visit via Video Note   I connected with Dawn Jordan on 07/28/19 by a video enabled telemedicine application and verified that I am speaking with the correct person using two identifiers.  Location patient: home Location provider:work office Persons participating in the virtual visit: patient, provider  I discussed the limitations of evaluation and management by telemedicine and the availability of in person appointments. The patient expressed understanding and agreed to proceed.   HPI: Dawn Jordan is a 53 years old female with history of OSA, asthma/seasonal allergies, GERD, major depression, and ADHD who is requesting a referral for pain clinic. She was last seen on 02/06/19.  History of fibromyalgia and generalized OA. According to patient she has received about 3 epidural injections this year but they have not helped with back pain. Surgical treatment of back pain was recommended but she does not want surgery at this time. Pain is occasionally radiated to LLE, + numbness. She denies saddle anesthesia or bowel/urine dysfunction.  She reporting having pain "everywhere." Upper, mid, and lower back and bilateral knees mainly. She follows with rheumatologist, history of RA.  It seems like treatment Morrie Sheldon XR 11 mg) is not helping with arthritis.  Pain is "9-12/10" and intermittent. Exacerbated by movement with certain activities and alleviated by rest.  She follows with psychiatrist, she is no longer on Ritalin. She has seen pain management before, when she was living in [facility], but received "injections", no medications.  She had received hydrocodone-acetaminophen from her orthopedist and recently tramadol from another provider. Hydrocodone-acetaminophen 5-325 mg helped tremendously with pain. She has had pain for a while, getting worse.  She does not want to be on "pain medication" because she was afraid of getting "hooked up" on it but she really feels now that she needs something to  help with the pain.  She states that total knee replacement has been recommended but she needs to lose some weight before doing so. She has not been able to exercise regularly due to pain.  Since her last visit she has been following with cardiologist, recently diagnosed with atrial fibrillation and started on metoprolol succinate 25 mg daily and flecainide 50 mg twice daily  She is still following with psychiatrist regularly for depression and PTSD.    ROS: See pertinent positives and negatives per HPI.  Past Medical History:  Diagnosis Date  . Anemia   . Anginal pain ([place])   . Anxiety   . Asthma   . Chronic fatigue syndrome   . COPD (chronic obstructive pulmonary disease) ([place])   . Depression   . Fibromyalgia   . GERD (gastroesophageal reflux disease)   . Headache    "weekly-monthly" (06/04/2017)  . History of gout   . History of kidney stones   . Left sciatic nerve pain   . OSA (obstructive sleep apnea)    "getting ready to retest" (06/04/2017)  . Rheumatoid arthritis ([place])   . Seasonal allergies     Past Surgical History:  Procedure Laterality Date  . CARPAL TUNNEL RELEASE Left   . CYST EXCISION     "little cysts taken off both hands and left arm" (06/04/2017)  . KNEE ARTHROSCOPY Bilateral    "3 on my left; 2 on my right" (06/04/2017)  . LAPAROSCOPIC CHOLECYSTECTOMY    . TONSILLECTOMY      Family History  Problem Relation Age of Onset  . Arthritis Mother   . Hyperlipidemia Mother   . Hypertension Mother   . Stroke Mother   .  Mental retardation Mother   . Diabetes Mother   . Allergic rhinitis Mother   . Diabetes Maternal Grandfather   . Hypertension Maternal Grandfather   . Diabetes Paternal Grandmother   . Hypertension Paternal Grandmother   . Cancer Paternal Grandmother        breast  . Allergic rhinitis Brother   . Colon cancer Paternal Aunt   . Lung cancer Other   . Lung cancer Maternal Uncle   . Asthma Neg Hx   . Eczema Neg Hx   . Immunodeficiency  Neg Hx   . Urticaria Neg Hx   . Atopy Neg Hx   . Angioedema Neg Hx   . Esophageal cancer Neg Hx   . Stomach cancer Neg Hx   . Rectal cancer Neg Hx     Social History   Socioeconomic History  . Marital status: Legally Separated    Spouse name: Not on file  . Number of children: 0  . Years of education: Not on file  . Highest education level: Not on file  Occupational History  . Occupation: not employed-disabled  Social Needs  . Financial resource strain: Not on file  . Food insecurity    Worry: Not on file    Inability: Not on file  . Transportation needs    Medical: Not on file    Non-medical: Not on file  Tobacco Use  . Smoking status: Never Smoker  . Smokeless tobacco: Never Used  Substance and Sexual Activity  . Alcohol use: No  . Drug use: No  . Sexual activity: Never  Lifestyle  . Physical activity    Days per week: Not on file    Minutes per session: Not on file  . Stress: Not on file  Relationships  . Social Herbalist on phone: Not on file    Gets together: Not on file    Attends religious service: Not on file    Active member of club or organization: Not on file    Attends meetings of clubs or organizations: Not on file    Relationship status: Not on file  . Intimate partner violence    Fear of current or ex partner: Not on file    Emotionally abused: Not on file    Physically abused: Not on file    Forced sexual activity: Not on file  Other Topics Concern  . Not on file  Social History Narrative   Born in [place], grew up in [place]where"   Unemployed, on disability since 1991 for anxiety,    Education: [employer] college   Single, no children, lives with her friend (used to live with her mother with stroke, now in ALF)        Current Outpatient Medications:  .  acetaminophen (TYLENOL) 500 MG tablet, Take 1 tablet (500 mg total) by mouth every 6 (six) hours as needed., Disp: 30 tablet, Rfl: 0 .  ACTEMRA 162 MG/0.9ML SOSY, , Disp: ,  Rfl:  .  albuterol (PROVENTIL HFA;VENTOLIN HFA) 108 (90 Base) MCG/ACT inhaler, Inhale 2 puffs into the lungs every 4 (four) hours as needed for up to 30 days for wheezing or shortness of breath., Disp: 1 Inhaler, Rfl: 3 .  aspirin EC 81 MG tablet, Take 1 tablet (81 mg total) by mouth daily., Disp: 90 tablet, Rfl: 3 .  budesonide-formoterol (SYMBICORT) 160-4.5 MCG/ACT inhaler, TAKE 2 PUFFS BY MOUTH TWICE A DAY, Disp: 3 Inhaler, Rfl: 0 .  clonazePAM (KLONOPIN) 0.5 MG tablet,  Take 1 tablet (0.5 mg total) by mouth 3 (three) times daily as needed for anxiety., Disp: 90 tablet, Rfl: 2 .  diclofenac sodium (VOLTAREN) 1 % GEL, Apply 1 application topically 2 (two) times daily as needed (back and knee pain). , Disp: , Rfl:  .  EPINEPHrine (EPIPEN 2-PAK) 0.3 mg/0.3 mL IJ SOAJ injection, Use for life threatening allergic reactions, Disp: 2 Device, Rfl: 6 .  flecainide (TAMBOCOR) 50 MG tablet, Take 1 tablet (50 mg total) by mouth 2 (two) times daily., Disp: 60 tablet, Rfl: 11 .  levocetirizine (XYZAL) 5 MG tablet, Take 1 tablet (5 mg total) by mouth every evening., Disp: 30 tablet, Rfl: 0 .  lithium carbonate 150 MG capsule, TAKE 1 CAPSULE BY MOUTH EVERY DAY, Disp: 90 capsule, Rfl: 0 .  Loperamide HCl (IMODIUM PO), Take by mouth as needed., Disp: , Rfl:  .  montelukast (SINGULAIR) 10 MG tablet, Take 1 tablet (10 mg total) by mouth at bedtime., Disp: 30 tablet, Rfl: 5 .  nabumetone (RELAFEN) 500 MG tablet, Take 500 mg by mouth 3 (three) times daily., Disp: , Rfl: 3 .  nitroGLYCERIN (NITROSTAT) 0.4 MG SL tablet, Place 1 tablet (0.4 mg total) under the tongue every 5 (five) minutes as needed for chest pain., Disp: 25 tablet, Rfl: 6 .  omeprazole (PRILOSEC) 40 MG capsule, TAKE 1 CAPSULE BY MOUTH EVERY DAY 30 MINUTES BEFORE BREAKFAST, Disp: 90 capsule, Rfl: 0 .  pregabalin (LYRICA) 200 MG capsule, Take 200 mg by mouth 2 (two) times daily. , Disp: , Rfl:  .  traMADol (ULTRAM) 50 MG tablet, Take 50 mg by mouth every 8  (eight) hours as needed., Disp: , Rfl:  .  Vilazodone HCl (VIIBRYD) 20 MG TABS, Take 1 tablet (20 mg total) by mouth daily., Disp: 30 tablet, Rfl: 1  Current Facility-Administered Medications:  .  0.9 %  sodium chloride infusion, 500 mL, Intravenous, Continuous, Milus Banister, MD  EXAM:  VITALS per patient if applicable:She does not have a BP monitor.  GENERAL: alert, oriented, appears well and in no acute distress  HEENT: atraumatic, conjunctiva clear, no obvious abnormalities on inspection of external nose and ears  NECK: normal movements of the head and neck  LUNGS: on inspection no signs of respiratory distress, breathing rate appears normal, no obvious gross SOB, gasping or wheezing  CV: no obvious cyanosis  Dawn: moves all visible extremities without noticeable abnormality. No signs of synovitis.  PSYCH/NEURO: pleasant and cooperative, no obvious depression,+ anxious.Speech and thought processing grossly intact  ASSESSMENT AND PLAN:  Discussed the following assessment and plan:  Fibromyalgia syndrome - Plan: Ambulatory referral to Pain Clinic She is on Lyrica. She is following with rheumatologist. Low impact exercise and good sleep hygiene.  Polyarthralgia - Plan: Ambulatory referral to Pain Clinic Could be a combination of RA and OA. Continue following with rheumatologists.  Class 2 severe obesity with serious comorbidity and body mass index (BMI) of 37.0 to 37.9 in adult, unspecified obesity type ([place]) We discussed benefits of wt loss as well as adverse effects of obesity. Consistency with healthy diet. Regular exercise is challenging due to pain but low impact exercise is beneficial also for fibromyalgia.  Chronic pain disorder - Plan: Ambulatory referral to Pain Clinic We discussed current guidelines for chronic pain management and chronic opioid intake.   I discussed the assessment and treatment plan with the patient. She was provided an opportunity to ask  questions and all were answered.She agreed with the plan  and demonstrated an understanding of the instructions.    Follow up as needed.    Tinamarie Przybylski [place], MD

## 2019-07-31 ENCOUNTER — Telehealth: Payer: Self-pay | Admitting: *Deleted

## 2019-07-31 NOTE — Telephone Encounter (Signed)
Patient is aware and agreeable to Home Sleep Study through [facility]. Patient is scheduled for 9/4 at 11 am to pick up home sleep kit and meet with Respiratory therapist at [facility]. Patient is aware that if this appointment date and time does not work for them they should contact Artis Delay directly at [phone]. Patient is aware that a sleep packet will be sent from [facility] in week. Patient is agreeable to treatment and thankful for call.

## 2019-07-31 NOTE — Telephone Encounter (Signed)
-----   Message from Lauralee Evener, [place] sent at 07/31/2019  8:13 AM EDT ----- Regarding: RE: precert for Home Sleep Call lab to order HST. ----- Message ----- From: Freada Bergeron, CMA Sent: 07/25/2019  12:34 PM EDT To: [facility] Sleep Studies Subject: precert for Home Sleep                         Per Lorre Nick at [facility] [phone]) member does not qualify (split night [phone] [place]:[id number]) for the co morbidities and wants to switch to a Home Sleep Study which does not require a prior authorization.Is that ok?  Ok with me to switch to Home Sleep study [facility] sleep order placed

## 2019-08-11 ENCOUNTER — Encounter (HOSPITAL_BASED_OUTPATIENT_CLINIC_OR_DEPARTMENT_OTHER): Payer: Medicare Other | Admitting: Cardiology

## 2019-08-19 ENCOUNTER — Other Ambulatory Visit: Payer: Self-pay | Admitting: Family Medicine

## 2019-08-21 ENCOUNTER — Other Ambulatory Visit: Payer: Self-pay

## 2019-08-21 ENCOUNTER — Ambulatory Visit (HOSPITAL_BASED_OUTPATIENT_CLINIC_OR_DEPARTMENT_OTHER): Payer: 59 | Attending: Cardiology | Admitting: Cardiology

## 2019-08-21 DIAGNOSIS — G4733 Obstructive sleep apnea (adult) (pediatric): Secondary | ICD-10-CM | POA: Diagnosis not present

## 2019-08-21 MED ORDER — MONTELUKAST SODIUM 10 MG PO TABS: 10.0000 mg | ORAL_TABLET | Freq: Every day | ORAL | 1 refills | Status: DC

## 2019-08-21 NOTE — Telephone Encounter (Signed)
Prescription refill requested on montelukast 10 mg. This has been sent in to the requesting pharmacy. I am sending this in with one additional fill until the patient can be seen in the office.

## 2019-08-21 NOTE — Telephone Encounter (Signed)
Courtesy refill of ventolin sent. No additional refills until ov.

## 2019-08-24 ENCOUNTER — Other Ambulatory Visit: Payer: Self-pay

## 2019-08-24 ENCOUNTER — Ambulatory Visit (HOSPITAL_COMMUNITY): Payer: Medicare Other | Admitting: Psychiatry

## 2019-08-31 ENCOUNTER — Ambulatory Visit (INDEPENDENT_AMBULATORY_CARE_PROVIDER_SITE_OTHER): Payer: 59 | Admitting: Psychiatry

## 2019-08-31 ENCOUNTER — Encounter (HOSPITAL_COMMUNITY): Payer: Self-pay | Admitting: Psychiatry

## 2019-08-31 ENCOUNTER — Encounter: Payer: Self-pay | Admitting: Physical Medicine and Rehabilitation

## 2019-08-31 ENCOUNTER — Other Ambulatory Visit: Payer: Self-pay

## 2019-08-31 ENCOUNTER — Encounter: Payer: 59 | Attending: Physical Medicine and Rehabilitation | Admitting: Physical Medicine and Rehabilitation

## 2019-08-31 VITALS — BP 134/84 | HR 71 | Temp 97.5°F | Ht 64.0 in | Wt 251.0 lb

## 2019-08-31 DIAGNOSIS — M0579 Rheumatoid arthritis with rheumatoid factor of multiple sites without organ or systems involvement: Secondary | ICD-10-CM | POA: Diagnosis not present

## 2019-08-31 DIAGNOSIS — F331 Major depressive disorder, recurrent, moderate: Secondary | ICD-10-CM

## 2019-08-31 DIAGNOSIS — I480 Paroxysmal atrial fibrillation: Secondary | ICD-10-CM | POA: Insufficient documentation

## 2019-08-31 DIAGNOSIS — I4819 Other persistent atrial fibrillation: Secondary | ICD-10-CM | POA: Diagnosis not present

## 2019-08-31 DIAGNOSIS — M797 Fibromyalgia: Secondary | ICD-10-CM | POA: Insufficient documentation

## 2019-08-31 DIAGNOSIS — I209 Angina pectoris, unspecified: Secondary | ICD-10-CM

## 2019-08-31 DIAGNOSIS — F4310 Post-traumatic stress disorder, unspecified: Secondary | ICD-10-CM | POA: Diagnosis not present

## 2019-08-31 DIAGNOSIS — F909 Attention-deficit hyperactivity disorder, unspecified type: Secondary | ICD-10-CM

## 2019-08-31 DIAGNOSIS — G5703 Lesion of sciatic nerve, bilateral lower limbs: Secondary | ICD-10-CM

## 2019-08-31 DIAGNOSIS — M7918 Myalgia, other site: Secondary | ICD-10-CM | POA: Insufficient documentation

## 2019-08-31 MED ORDER — LITHIUM CARBONATE 150 MG PO CAPS: ORAL_CAPSULE | ORAL | 0 refills | Status: DC

## 2019-08-31 MED ORDER — VIIBRYD 20 MG PO TABS: 20.0000 mg | ORAL_TABLET | Freq: Every day | ORAL | 0 refills | Status: DC

## 2019-08-31 MED ORDER — LIDOCAINE 5 % EX PTCH: 3.0000 | MEDICATED_PATCH | Freq: Two times a day (BID) | CUTANEOUS | 5 refills | Status: DC
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
## 2019-08-31 NOTE — Progress Notes (Signed)
  Virtual Visit via Telephone Note  I connected with Dawn Jordan on 08/31/19 at  3:15 PM EDT by telephone and verified that I am speaking with the correct person using two identifiers.  Location: Patient: home Provider: office   I discussed the limitations, risks, security and privacy concerns of performing an evaluation and management service by telephone and the availability of in person appointments. I also discussed with the patient that there may be a patient responsible charge related to this service. The patient expressed understanding and agreed to proceed.   History of Present Illness: Pt was diagnosed with Atrial fib after a number of studies. She stopped taking Ritalin 1 month ago because they were working on lowering her BP. As a result she is not focused and has difficulty completing tasks. Today her PM&R doctor  diagnosed her with myofascial pain syndrome. Her doctor advised against stimulant meds as well. Her pain has affected her quality of life in a very negative way. She doesn't want to move around too much because it causes so much pain. The experiences are physically and mentally draining. She is depressed about it. She denies SI/HI. She is tired of living like this and wants to be able to enjoy herself. Viibryd is helping in small ways. She just has a lot going on. She is trying not to take Klonopin 1 tab every other day. She doesn't want to get addicted and doesn't want SE. PTSD is ongoing- HV is high and she has intrusive memories with triggers. She continues to experience nightmares. Pt does not want her meds changed today.     Observations/Objective: I spoke with Dawn Jordan on the phone.  Pt was calm, pleasant and cooperative.  Pt was engaged in the conversation and answered questions appropriately.  Speech was clear and coherent with normal rate, tone and volume.  Mood is depressed and anxious, affect is congruent. Thought processes are coherent and circumstantial.   Thought content is with ruminations.  Pt denies SI/HI.   Pt denies auditory and visual hallucinations and did not appear to be responding to internal stimuli.  Memory and concentration are good.  Fund of knowledge and use of language are average.  Insight and judgment are fair.  I am unable to comment on psychomotor activity, general appearance, hygiene, or eye contact as I was unable to physically see the patient on the phone.  Vital signs not available since interview conducted virtually.     Assessment and Plan: PTSD; MDD-recurrent, moderate; ADHD-inattentive type  Status of current symptoms: ongoing PTSD and mood; ADHD worse without med  Klonopin 0.5mg  po TID prn anxiety Lithobid 150mg  po qD Viibrdy 20mg  po qD  D/c Ritalin due to A. Fib May consider treatment with non-stimulant treatment for ADHD  Reviewed notes from cardiologist and PM &R  Follow Up Instructions: In 8-10 weeks or sooner if needed   I discussed the assessment and treatment plan with the patient. The patient was provided an opportunity to ask questions and all were answered. The patient agreed with the plan and demonstrated an understanding of the instructions.   The patient was advised to call back or seek an in-person evaluation if the symptoms worsen or if the condition fails to improve as anticipated.  I provided 30 minutes of non-face-to-face time during this encounter.   Charlcie Cradle, MD

## 2019-08-31 NOTE — Patient Instructions (Signed)
Pt is a 53 yr old female with RA, fibromyalgia, myofascial pain syndrome, and low back pain- chronic pain and piriformis syndrome   1. RA- spoon theory on energy- discussed with pt.  2. ADD/ADHD- suggest no stimulants- suggest psychiatrist about non stimulants medications, Since A Fib   3. Myofascial pain syndrome- will get her trigger point injections scheduled.   4. Fibromyalgia- will con't Lyrica-  1. Dr Sharion Balloon- The survivor's Handbook to Fibromyalgia and Myofascial Pain Syndrome 2. Theracane- 2-4 minutes on each trigger point- hold firm pressure, don't massage; can get for $20-30 online- will never need replacing 3. Tennis balls- 2-5 minutes on each trigger point- buttocks, back of thighs, calves, low and mid back- can throw in dryer x1 to make softer. 4. Magnesium [id number] mg 2-3x/day for muscle tightness- titrate up until loose stools 5. Lidocaine patches- 2-3 patches 12 hrs on;12 hrs off- areas of most pain- never on bottom of feet 6. Rolling pin- can use on calves, thighs and arms, and buttocks- roll slowly over muscles firmly- roll towards heart -of note, has peripheral neuropathy as well/sciatica- would benefit from lidocaine patches, so doesn't need opiates/control pain with addition of Lyrica  5. B/L DJD of B/L knees- needs to have knees replaced- concerned about allergies/surgery,etc.  6. Piriformis syndrome- can do now- do tennis ball for 10-15 minutes daily x 30 days-sit on pillow and put tennis ball between pillow and you.   7. F/U in 2-3 weeks for trigger point injections -will see more frequently until have done trigger point injections.

## 2019-09-04 ENCOUNTER — Telehealth: Payer: Self-pay | Admitting: *Deleted

## 2019-09-04 NOTE — Telephone Encounter (Signed)
Prior Authorization submitted to [employer] for lidocaine 5% patches, Approved

## 2019-09-06 ENCOUNTER — Other Ambulatory Visit: Payer: Self-pay

## 2019-09-06 DIAGNOSIS — Z20822 Contact with and (suspected) exposure to covid-19: Secondary | ICD-10-CM

## 2019-09-07 LAB — NOVEL CORONAVIRUS, NAA: SARS-CoV-2, NAA: NOT DETECTED

## 2019-09-11 NOTE — Procedures (Signed)
    Patient Name: Dawn Jordan, Belvin Date: 08/22/2019 Gender: Female D.O.B: 07/05/1966 Age (years): 52 Referring Provider: Jerline Pain Height (inches): 64 Interpreting Physician: Fransico Him MD, ABSM Weight (lbs): 241 RPSGT: Jacolyn Reedy BMI: 41 MRN: [id number] Neck Size: 16.50  CLINICAL INFORMATION Sleep Study Type: HST  Indication for sleep study: OSA  Epworth Sleepiness Score: NA  SLEEP STUDY TECHNIQUE A multi-channel overnight portable sleep study was performed. The channels recorded were: nasal airflow, thoracic respiratory movement, and oxygen saturation with a pulse oximetry. Snoring was also monitored.  MEDICATIONS Patient self administered medications include: N/A.  SLEEP ARCHITECTURE Patient was studied for 438.8 minutes. The sleep efficiency was 100.0 % and the patient was supine for 92%. The arousal index was 0.0 per hour.  RESPIRATORY PARAMETERS The overall AHI was 18.7 per hour, with a central apnea index of 0.0 per hour.  The oxygen nadir was 68% during sleep.  CARDIAC DATA Mean heart rate during sleep was 66.6 bpm.  IMPRESSIONS - Moderate obstructive sleep apnea occurred during this study (AHI = 18.7/h). - No significant central sleep apnea occurred during this study (CAI = 0.0/h). - Severe oxygen desaturation was noted during this study (Min O2 = 68%). - Patient snored 28.7% during the sleep.  DIAGNOSIS - Obstructive Sleep Apnea (327.23 [G47.33 ICD-10]) - Nocturnal Hypoxemia (327.26 [G47.36 ICD-10])  RECOMMENDATIONS - Recommend CPAP titration. - Positional therapy avoiding supine position during sleep. - Avoid alcohol, sedatives and other CNS depressants that may worsen sleep apnea and disrupt normal sleep architecture. - Sleep hygiene should be reviewed to assess factors that may improve sleep quality. - Weight management and regular exercise should be initiated or continued.  [Electronically signed] 09/11/2019 04:55 PM  Fransico Him  MD, ABSM Diplomate, American Board of Sleep Medicine

## 2019-09-12 ENCOUNTER — Telehealth: Payer: Self-pay | Admitting: *Deleted

## 2019-09-12 ENCOUNTER — Telehealth: Payer: Self-pay

## 2019-09-12 NOTE — Telephone Encounter (Signed)
PA for CPAP titration submitted via web portal to [facility].

## 2019-09-12 NOTE — Telephone Encounter (Signed)
Patient called stating Lidocaine patches are $85 for 15 with insurance and she can't afford that. Is there something else?

## 2019-09-12 NOTE — Telephone Encounter (Signed)
-----   Message from Sueanne [name], MD sent at 09/11/2019  4:58 PM EDT ----- Please let patient know that they have sleep apnea and recommend CPAP titration. Please set up titration in the sleep lab.

## 2019-09-12 NOTE — Telephone Encounter (Addendum)
Informed patient of sleep study results and patient understanding was verbalized. Patient understands her sleep study showed they have sleep apnea and recommend CPAP titration. Please set up titration in the sleep lab.  Pt is aware and agreeable to her results.  cpap titration sent to the sleep pool.

## 2019-09-13 ENCOUNTER — Telehealth (HOSPITAL_COMMUNITY): Payer: Self-pay

## 2019-09-13 NOTE — Telephone Encounter (Signed)
Patient called and said she is waiting on a call back form you about an ADHD medication that will not interfere with her heart medications. Please review and advise, thank you

## 2019-09-14 ENCOUNTER — Telehealth: Payer: Self-pay | Admitting: *Deleted

## 2019-09-14 DIAGNOSIS — G4733 Obstructive sleep apnea (adult) (pediatric): Secondary | ICD-10-CM

## 2019-09-14 MED ORDER — LIDOCAINE 5 % EX OINT: 1.0000 "application " | TOPICAL_OINTMENT | Freq: Four times a day (QID) | CUTANEOUS | 11 refills | Status: DC | PRN

## 2019-09-14 NOTE — Telephone Encounter (Signed)
I have been studying up on Intuniv and Strattera. I would like to get the opinion of her cardiologist on both these meds before starting anything. Please have her contact them.

## 2019-09-14 NOTE — Telephone Encounter (Signed)
-----   Message from Lauralee Evener, [place] sent at 09/14/2019  2:35 PM EDT ----- Regarding: RE: precert Received a call from [facility] denying CPAP titration. Recommended APAP. No PA required or MD can call [phone] for peer to peer. ----- Message ----- From: Freada Bergeron, CMA Sent: 09/12/2019   1:51 PM EDT To: Cv Div Sleep Studies Subject: precert                                         recommend CPAP titration

## 2019-09-14 NOTE — Telephone Encounter (Signed)
No significant choices except lidocaine cream- can order it for pt if they want- but have to do 3-4x/day and doesn't last as long.

## 2019-09-14 NOTE — Telephone Encounter (Signed)
Staff message sent to Gae Bon received a call from [facility] denying CPAP titration. recommends APAP or MD can call [phone] for peer to peer.

## 2019-09-15 NOTE — Telephone Encounter (Signed)
I called the patient and advised her to call her cardiologist about these two medications, patient will call me back.

## 2019-09-18 ENCOUNTER — Encounter: Payer: 59 | Attending: Physical Medicine and Rehabilitation | Admitting: Physical Medicine and Rehabilitation

## 2019-09-18 ENCOUNTER — Encounter: Payer: Self-pay | Admitting: Physical Medicine and Rehabilitation

## 2019-09-18 ENCOUNTER — Other Ambulatory Visit: Payer: Self-pay

## 2019-09-18 VITALS — BP 123/85 | HR 69 | Temp 98.6°F | Ht 64.0 in | Wt 249.6 lb

## 2019-09-18 DIAGNOSIS — I4819 Other persistent atrial fibrillation: Secondary | ICD-10-CM | POA: Diagnosis not present

## 2019-09-18 DIAGNOSIS — M7918 Myalgia, other site: Secondary | ICD-10-CM

## 2019-09-18 DIAGNOSIS — M797 Fibromyalgia: Secondary | ICD-10-CM | POA: Diagnosis not present

## 2019-09-18 DIAGNOSIS — G5703 Lesion of sciatic nerve, bilateral lower limbs: Secondary | ICD-10-CM | POA: Insufficient documentation

## 2019-09-18 DIAGNOSIS — M0579 Rheumatoid arthritis with rheumatoid factor of multiple sites without organ or systems involvement: Secondary | ICD-10-CM | POA: Insufficient documentation

## 2019-09-18 DIAGNOSIS — Z6837 Body mass index (BMI) 37.0-37.9, adult: Secondary | ICD-10-CM

## 2019-09-18 MED ORDER — DULOXETINE HCL 30 MG PO CPEP: 30.0000 mg | ORAL_CAPSULE | Freq: Every day | ORAL | 5 refills | Status: DC

## 2019-09-18 NOTE — Patient Instructions (Signed)
Patient was advised to drink a lot of water on day after injections to flush system Will have increased soreness for 12-48 hours after injections.  Can use Lidocaine cream the day AFTER injections Can use theracane on day of injections in places didn't inject Can use heating pad 4-6 hours AFTER injections Can use ice immediately.    Plan: 1. Being tested for Sleep apnea- topped breathing 19 x in 10 hours  2. Drink 1-2L of water today  3. B/L knee injections- cleaned with iodine x3- allowed to dry then injected 1cc Kenalog/40 mg and 1cc of 1% Lidocaine with no EPI using 27 gauge 1.5 inch needle- using distal lateral approach on R then L knee. A lot of arthritis, so needed to change angle in both knees.  4. Retry Duloxetine/Cymbalta.    Duloxetine /Cymbalta 30 mg nightly x 1 week  Then 60 mg nightly- for nerve pain/Fibromyalgia  1% of patients can have nausea with Duloxetine- call me if needs an anti-nausea medicine. Can also cause mild dry mouth/dry eyes and mild constipation. Of note, stopped taking Lithium (not type I) and Ritalin. To make sure A Fib under control. Never had Mania with Cymbalta in past.

## 2019-09-18 NOTE — Progress Notes (Signed)
Pt is a 53 yr old R handed female with hx of RA- dx'd with 2012, (fell off horse 2011),  Fibromyalgia, DJD/OA, and back pain- s/p 3 epidurals steroid injections with no improvement- here for evaluation.   Didn't get patches- due to cost- was $85 for 14 patches per insurance. [employer].  Plan on getting knee surgery- but wants to get weight off prior. Has stuck to knocking sodas out and wants to commit to [employer]- will have to get rid of stuff /bad food and get good foods.  R knee is so painful- esp extending- hard to sit still due ot pain.   Here for trigger point injections.  Patient here for trigger point injections for  Consent done and on chart.  Cleaned areas with alcohol and injected using a 27 gauge 1.5 inch needle  Injected 6cc Using 1% Lidocaine with no EPI  Upper traps L Levators L Posterior scalenes L Middle scalenes Splenius Capitus B/L Pectoralis Major Rhomboids Infraspinatus Teres Major/minor Thoracic paraspinals Lumbar paraspinals x3 on L and 1 on R Other injections-    Patient's level of pain prior was 9/10 Current level of pain after injections is ~8/10- increased ROM of cervical spine seen-   There was no bleeding or complications.  Patient was advised to drink a lot of water on day after injections to flush system Will have increased soreness for 12-48 hours after injections.  Can use Lidocaine patches the day AFTER injections Can use theracane on day of injections in places didn't inject Can use heating pad 4-6 hours AFTER injections Can use ice immediately.    Plan: 1. Being tested for Sleep apnea- topped breathing 19 x in 10 hours  2. Drink 1-2L of water today  3. B/L knee injections- cleaned with iodine x3- allowed to dry then injected 1cc Kenalog/40 mg and 1cc of 1% Lidocaine with no EPI using 27 gauge 1.5 inch needle- using distal lateral approach on R then L knee. A lot of arthritis, so needed to change angle in both  knees.  4. Retry Duloxetine/Cymbalta.    Duloxetine /Cymbalta 30 mg nightly x 1 week  Then 60 mg nightly- for nerve pain/Fibromyalgia  1% of patients can have nausea with Duloxetine- call me if needs an anti-nausea medicine. Can also cause mild dry mouth/dry eyes and mild constipation. Of note, stopped taking Lithium (not type I) and Ritalin. To make sure A Fib under control. Never had Mania with Cymbalta in past.

## 2019-09-19 ENCOUNTER — Other Ambulatory Visit: Payer: Self-pay | Admitting: Family Medicine

## 2019-09-20 ENCOUNTER — Other Ambulatory Visit: Payer: Self-pay

## 2019-09-20 DIAGNOSIS — Z20822 Contact with and (suspected) exposure to covid-19: Secondary | ICD-10-CM

## 2019-09-21 LAB — NOVEL CORONAVIRUS, NAA: SARS-CoV-2, NAA: DETECTED — AB

## 2019-09-22 ENCOUNTER — Telehealth: Payer: Self-pay | Admitting: Cardiology

## 2019-09-22 ENCOUNTER — Telehealth: Payer: Self-pay | Admitting: Physical Medicine and Rehabilitation

## 2019-09-22 NOTE — Telephone Encounter (Signed)
Will forward to Dr Marlou Porch for review and any recommendations.

## 2019-09-22 NOTE — Telephone Encounter (Signed)
Pt aware, no medication changes recommended by Dr Marlou Porch.

## 2019-09-22 NOTE — Telephone Encounter (Signed)
° ° ° °  Patient calling to report she tested positive for covid19 on 10/15, she would like to know if she needs to adjust any of her medications.  Please call

## 2019-09-22 NOTE — Telephone Encounter (Signed)
Sorry to hear this.  No changes made in medications.  Continue to get better. Candee Furbish, MD

## 2019-09-22 NOTE — Telephone Encounter (Signed)
Patient called our office to let us know that she had a positive COVID test last night.  Patient was seen in our office on Monday as new pt with Dr. Dagoberto Ligas.  I have called my manager Zorita Pang to let her know of the incident.

## 2019-09-25 ENCOUNTER — Ambulatory Visit: Payer: Self-pay

## 2019-09-25 ENCOUNTER — Ambulatory Visit (INDEPENDENT_AMBULATORY_CARE_PROVIDER_SITE_OTHER): Payer: 59 | Admitting: Licensed Clinical Social Worker

## 2019-09-25 ENCOUNTER — Encounter (HOSPITAL_COMMUNITY): Payer: Self-pay | Admitting: Licensed Clinical Social Worker

## 2019-09-25 ENCOUNTER — Other Ambulatory Visit: Payer: Self-pay

## 2019-09-25 ENCOUNTER — Encounter: Payer: Self-pay | Admitting: Family Medicine

## 2019-09-25 ENCOUNTER — Telehealth (INDEPENDENT_AMBULATORY_CARE_PROVIDER_SITE_OTHER): Payer: 59 | Admitting: Family Medicine

## 2019-09-25 VITALS — HR 74 | Resp 16

## 2019-09-25 DIAGNOSIS — F4310 Post-traumatic stress disorder, unspecified: Secondary | ICD-10-CM

## 2019-09-25 DIAGNOSIS — U071 COVID-19: Secondary | ICD-10-CM

## 2019-09-25 DIAGNOSIS — J45909 Unspecified asthma, uncomplicated: Secondary | ICD-10-CM | POA: Diagnosis not present

## 2019-09-25 DIAGNOSIS — I209 Angina pectoris, unspecified: Secondary | ICD-10-CM

## 2019-09-25 DIAGNOSIS — R110 Nausea: Secondary | ICD-10-CM | POA: Diagnosis not present

## 2019-09-25 DIAGNOSIS — J029 Acute pharyngitis, unspecified: Secondary | ICD-10-CM | POA: Diagnosis not present

## 2019-09-25 DIAGNOSIS — F909 Attention-deficit hyperactivity disorder, unspecified type: Secondary | ICD-10-CM | POA: Diagnosis not present

## 2019-09-25 MED ORDER — ONDANSETRON 4 MG PO TBDP: 4.0000 mg | ORAL_TABLET | Freq: Three times a day (TID) | ORAL | 0 refills | Status: AC | PRN

## 2019-09-25 MED ORDER — BENZONATATE 100 MG PO CAPS: 200.0000 mg | ORAL_CAPSULE | Freq: Two times a day (BID) | ORAL | 0 refills | Status: AC | PRN

## 2019-09-25 MED ORDER — MAGIC MOUTHWASH W/LIDOCAINE: 5.0000 mL | Freq: Three times a day (TID) | ORAL | 0 refills | Status: AC | PRN

## 2019-09-25 NOTE — Progress Notes (Signed)
Virtual Visit via Telephone Note  I connected with Dawn Jordan on 09/25/19 at  1:30 PM EDT by telephone and verified that I am speaking with the correct person using two identifiers.     I discussed the limitations, risks, security and privacy concerns of performing an evaluation and management service by telephone and the availability of in person appointments. I also discussed with the patient that there may be a patient responsible charge related to this service. The patient expressed understanding and agreed to proceed.  Type of Therapy: Individual Therapy  Treatment Goals addressed: "to get the ADHD under control and process trauma"  Interventions: CBT, Motivational Interviewing, grounding and mindfulness techniques, psychoeducation  Summary: Dawn Jordan is a 53 y.o. female who presents with PTSD and ADHD Combined Presentation  Suicidal/Homicidal: No - without intent/plan  Therapist Response: Dawn Jordan met with clinician for an individual session. Dawn Jordan discussed her psychiatric symptoms, her current life events and her homework. Dawn Jordan reported that she tested positive for COVID a few days ago and has been feeling very weak, swollen throat and glands, unable to swallow. She lost her sense of taste and smell 2 days ago. She reports her partner also has COVID, but her sxs are about a day behind Dawn Jordan. Dawn Jordan reports she does not know where she contracted the virus, but that she felt terrible that she gave it to her wife. Clinician explored treatment and noted that her doctor would be calling this afternoon. She reported fear about going to the hospital, that she may not come out. Clinician urged Dawn Jordan to go if she felt dehydrated so she would not have further complications.    Plan: Return again in 1-2 weeks.  Diagnosis: Axis I: PTSD and ADHD Combined Presentation     I discussed the assessment and treatment plan with the patient. The patient was provided an opportunity to ask questions  and all were answered. The patient agreed with the plan and demonstrated an understanding of the instructions.   The patient was advised to call back or seek an in-person evaluation if the symptoms worsen or if the condition fails to improve as anticipated.  I provided 20 minutes of non-face-to-face time during this encounter.   [name], LCSW

## 2019-09-25 NOTE — Telephone Encounter (Signed)
  Pt. Reports she tested positive for COVID 19 last week. Has "a really bad sore throat now,hurts to swallow. Feels swollen." Denies any shortness of breath. Has tried warm liquids. Does have asthma, O2 saturation 94%. No fever. Does feel weak. Warm transfer to Tammy in the practice for virtual visit. Answer Assessment - Initial Assessment Questions 1. ONSET: "When did the throat start hurting?" (Hours or days ago)      Started 2 days ago 2. SEVERITY: "How bad is the sore throat?" (Scale 1-10; mild, moderate or severe)   - MILD (1-3):  doesn't interfere with eating or normal activities   - MODERATE (4-7): interferes with eating some solids and normal activities   - SEVERE (8-10):  excruciating pain, interferes with most normal activities   - SEVERE DYSPHAGIA: can't swallow liquids, drooling     Severe 3. STREP EXPOSURE: "Has there been any exposure to strep within the past week?" If so, ask: "What type of contact occurred?"      No - tested positive for COVID 19 4.  VIRAL SYMPTOMS: "Are there any symptoms of a cold, such as a runny nose, cough, hoarse voice or red eyes?"      Cough 5. FEVER: "Do you have a fever?" If so, ask: "What is your temperature, how was it measured, and when did it start?"     No 6. PUS ON THE TONSILS: "Is there pus on the tonsils in the back of your throat?"     Unsure 7. OTHER SYMPTOMS: "Do you have any other symptoms?" (e.g., difficulty breathing, headache, rash)     Weakness 8. PREGNANCY: "Is there any chance you are pregnant?" "When was your last menstrual period?"     No  Protocols used: SORE THROAT-A-AH

## 2019-09-25 NOTE — Progress Notes (Signed)
Virtual Visit via Video Note   I connected with Dawn Jordan on 09/25/19 by a video enabled telemedicine application and verified that I am speaking with the correct person using two identifiers.  Location patient: home Location provider:work office Persons participating in the virtual visit: patient, provider  I discussed the limitations of evaluation and management by telemedicine and the availability of in person appointments. The patient expressed understanding and agreed to proceed.   HPI:  Dawn Jordan is a 53 yo female with Hx of fibromyalgia,bipolar disorder,asthma,atrial fib,and obesity c/o 5-6 days of chills,decreased appetite,body aches, worsening fatigue,sore throat, productive cough with clear sputum,and changes in smell and taste. "bad" sore throat,feels like "swollen", pain exacerbated by swallowing.  Covid 19 screening was positive. She has not had fever, stridor, dyspnea, wheezing, abdominal pain, vomiting, changes in bowel habits, urinary symptoms, or skin rash. + Nausea.  She has history of chronic pain, currently she is on hydrocodone-acetaminophen 5-325 mg every 6 hours as needed and tramadol 50 mg 3 times daily as needed.  Her spouse is also sick, COVID-19 infection.  Asthma on Xopenex inh and Symbicort 160-4.5 mcg bid.   ROS: See pertinent positives and negatives per HPI.  Past Medical History:  Diagnosis Date  . Anemia   . Anginal pain ([place])   . Anxiety   . Asthma   . Chronic fatigue syndrome   . COPD (chronic obstructive pulmonary disease) ([place])   . Depression   . Fibromyalgia   . GERD (gastroesophageal reflux disease)   . Headache    "weekly-monthly" (06/04/2017)  . History of gout   . History of kidney stones   . Left sciatic nerve pain   . OSA (obstructive sleep apnea)    "getting ready to retest" (06/04/2017)  . Rheumatoid arthritis ([place])   . Seasonal allergies     Past Surgical History:  Procedure Laterality Date  . CARPAL TUNNEL RELEASE Left    . CYST EXCISION     "little cysts taken off both hands and left arm" (06/04/2017)  . KNEE ARTHROSCOPY Bilateral    "3 on my left; 2 on my right" (06/04/2017)  . LAPAROSCOPIC CHOLECYSTECTOMY    . TONSILLECTOMY      Family History  Problem Relation Age of Onset  . Arthritis Mother   . Hyperlipidemia Mother   . Hypertension Mother   . Stroke Mother   . Mental retardation Mother   . Diabetes Mother   . Allergic rhinitis Mother   . Diabetes Maternal Grandfather   . Hypertension Maternal Grandfather   . Diabetes Paternal Grandmother   . Hypertension Paternal Grandmother   . Cancer Paternal Grandmother        breast  . Allergic rhinitis Brother   . Colon cancer Paternal Aunt   . Lung cancer Other   . Lung cancer Maternal Uncle   . Asthma Neg Hx   . Eczema Neg Hx   . Immunodeficiency Neg Hx   . Urticaria Neg Hx   . Atopy Neg Hx   . Angioedema Neg Hx   . Esophageal cancer Neg Hx   . Stomach cancer Neg Hx   . Rectal cancer Neg Hx     Social History   Socioeconomic History  . Marital status: Legally Separated    Spouse name: Not on file  . Number of children: 0  . Years of education: Not on file  . Highest education level: Not on file  Occupational History  . Occupation: not employed-disabled  Social  Needs  . Financial resource strain: Not on file  . Food insecurity    Worry: Not on file    Inability: Not on file  . Transportation needs    Medical: Not on file    Non-medical: Not on file  Tobacco Use  . Smoking status: Never Smoker  . Smokeless tobacco: Never Used  Substance and Sexual Activity  . Alcohol use: No  . Drug use: No  . Sexual activity: Never  Lifestyle  . Physical activity    Days per week: Not on file    Minutes per session: Not on file  . Stress: Not on file  Relationships  . Social Herbalist on phone: Not on file    Gets together: Not on file    Attends religious service: Not on file    Active member of club or organization:  Not on file    Attends meetings of clubs or organizations: Not on file    Relationship status: Not on file  . Intimate partner violence    Fear of current or ex partner: Not on file    Emotionally abused: Not on file    Physically abused: Not on file    Forced sexual activity: Not on file  Other Topics Concern  . Not on file  Social History Narrative   Born in [place], [place]   Unemployed, on disability since 1991 for anxiety,    Education: [employer]   Single, no children, lives with her friend (used to live with her mother with stroke, now in ALF)       Current Outpatient Medications:  .  acetaminophen (TYLENOL) 500 MG tablet, Take 1 tablet (500 mg total) by mouth every 6 (six) hours as needed., Disp: 30 tablet, Rfl: 0 .  ACTEMRA 162 MG/0.9ML SOSY, , Disp: , Rfl:  .  aspirin EC 81 MG tablet, Take 1 tablet (81 mg total) by mouth daily., Disp: 90 tablet, Rfl: 3 .  benzonatate (TESSALON) 100 MG capsule, Take 2 capsules (200 mg total) by mouth 2 (two) times daily as needed for up to 10 days., Disp: 40 capsule, Rfl: 0 .  budesonide-formoterol (SYMBICORT) 160-4.5 MCG/ACT inhaler, TAKE 2 PUFFS BY MOUTH TWICE A DAY, Disp: 3 Inhaler, Rfl: 0 .  clonazePAM (KLONOPIN) 0.5 MG tablet, Take 1 tablet (0.5 mg total) by mouth 3 (three) times daily as needed for anxiety., Disp: 90 tablet, Rfl: 2 .  diclofenac sodium (VOLTAREN) 1 % GEL, Apply 1 application topically 2 (two) times daily as needed (back and knee pain). , Disp: , Rfl:  .  DULoxetine (CYMBALTA) 30 MG capsule, Take 1 capsule (30 mg total) by mouth at bedtime. X 1 week then 60 mg nightly- for nerve pain, Disp: 60 capsule, Rfl: 5 .  EPINEPHrine (EPIPEN 2-PAK) 0.3 mg/0.3 mL IJ SOAJ injection, Use for life threatening allergic reactions, Disp: 2 Device, Rfl: 6 .  flecainide (TAMBOCOR) 50 MG tablet, Take 1 tablet (50 mg total) by mouth 2 (two) times daily., Disp: 60 tablet, Rfl: 11 .  HYDROcodone-acetaminophen  (NORCO/VICODIN) 5-325 MG tablet, Take 1 tablet by mouth every 6 (six) hours as needed for moderate pain., Disp: , Rfl:  .  levocetirizine (XYZAL) 5 MG tablet, Take 1 tablet (5 mg total) by mouth every evening., Disp: 30 tablet, Rfl: 0 .  lidocaine (XYLOCAINE) 5 % ointment, Apply 1 application topically 4 (four) times daily as needed. To affected areas/panful areas- no more than dime sized  dollop at a time., Disp: 50 g, Rfl: 11 .  lithium carbonate 150 MG capsule, TAKE 1 CAPSULE BY MOUTH EVERY DAY, Disp: 90 capsule, Rfl: 0 .  magic mouthwash w/lidocaine SOLN, Take 5 mLs by mouth 3 (three) times daily as needed for up to 10 days for mouth pain. 50 ml of diphenhydramine, alum and mag hydroxide, and lidocaine to make 150 ml, Disp: 150 mL, Rfl: 0 .  metoprolol tartrate (LOPRESSOR) 25 MG tablet, Take 25 mg by mouth 2 (two) times daily., Disp: , Rfl:  .  montelukast (SINGULAIR) 10 MG tablet, Take 1 tablet (10 mg total) by mouth at bedtime., Disp: 30 tablet, Rfl: 1 .  nabumetone (RELAFEN) 500 MG tablet, Take 500 mg by mouth 3 (three) times daily., Disp: , Rfl: 3 .  nitroGLYCERIN (NITROSTAT) 0.4 MG SL tablet, Place 1 tablet (0.4 mg total) under the tongue every 5 (five) minutes as needed for chest pain., Disp: 25 tablet, Rfl: 6 .  omeprazole (PRILOSEC) 40 MG capsule, TAKE 1 CAPSULE BY MOUTH EVERY DAY 30 MINUTES BEFORE BREAKFAST, Disp: 90 capsule, Rfl: 0 .  ondansetron (ZOFRAN ODT) 4 MG disintegrating tablet, Take 1 tablet (4 mg total) by mouth every 8 (eight) hours as needed for up to 5 days for nausea or vomiting., Disp: 15 tablet, Rfl: 0 .  pregabalin (LYRICA) 200 MG capsule, Take 200 mg by mouth 2 (two) times daily. , Disp: , Rfl:  .  traMADol (ULTRAM) 50 MG tablet, Take 50 mg by mouth every 8 (eight) hours as needed., Disp: , Rfl:  .  VENTOLIN HFA 108 (90 Base) MCG/ACT inhaler, INHALE 2 PUFFS INTO THE LUNGS EVERY 4 HOURS AS NEEDED FOR WHEEZING/SHORTNESS OF BREATH, Disp: 6.7 g, Rfl: 0 .  Vilazodone HCl  (VIIBRYD) 20 MG TABS, Take 1 tablet (20 mg total) by mouth daily., Disp: 90 tablet, Rfl: 0  Current Facility-Administered Medications:  .  0.9 %  sodium chloride infusion, 500 mL, Intravenous, Continuous, Milus Banister, MD  EXAM:  VITALS per patient if applicable:Pulse 74   Resp 16 Comment: approx  LMP  (LMP Unknown)   SpO2 94%   GENERAL: alert, oriented, appears well and in mild acute distress due to body aches.She is in bed.  HEENT: atraumatic, conjunctiva clear, no obvious abnormalities on inspection of external nose and ears Throat clean,no exudate. No edema appreciated and no deviated uvula. No stridor.  NECK: normal movements of the head and neck  LUNGS: on inspection no signs of respiratory distress, breathing rate appears normal, no obvious gross SOB, gasping or wheezing. A few episodes of coughing spells.  CV: no obvious cyanosis  Dawn: moves all visible extremities without noticeable abnormality  PSYCH/NEURO: pleasant and cooperative, no obvious depression, she is anxious.Speech and thought processing grossly intact  ASSESSMENT AND PLAN:  Discussed the following assessment and plan:  COVID-19 virus infection We discussed clinical presentation of disease,treatment,and possible complications. She does not have respiratory distress or signs of serious process at this time. Symptomatic treatment recommended. Clearly instructed about warning signs.  Sore throat - Plan: magic mouthwash w/lidocaine SOLN Inspection is negative for edema or exudate. Symptomatic treatment recommended with mouth wash. Cool fluids. Instructed about warning signs.  Nausea without vomiting - Plan: ondansetron (ZOFRAN ODT) 4 MG disintegrating tablet Small and frequent sips of fluids. Zofran recommended.   Mild asthma, unspecified whether complicated, unspecified whether persistent Xopenex inh 2 puff every 6 hours for a week then as needed for wheezing or shortness of breath.  Continue  Symbicort 160-4.5 mcg bid.    I discussed the assessment and treatment plan with the patient. She was provided an opportunity to ask questions and all were answered. She agreed with the plan and demonstrated an understanding of the instructions.   Return if symptoms worsen or fail to improve.     [place], MD

## 2019-09-25 NOTE — Telephone Encounter (Signed)
Noted  

## 2019-09-25 NOTE — Telephone Encounter (Signed)
Patient has a virtual visit at [facility]

## 2019-10-03 NOTE — Telephone Encounter (Signed)
She has morbid obesity, COPD and nocturnal hypoxemia and should qualify for inlab study please resubmit

## 2019-10-03 NOTE — Telephone Encounter (Addendum)
Message sent to [facility] in precert to resubmit for cpap titration.

## 2019-10-03 NOTE — Telephone Encounter (Signed)
LATE ENTRY:  September 14, 2019 [employer] sent a fax that the Coverage decision for the TITRATION study is not covered. Reference :[id number] No acceptable co-morbid conditions 1. Congestive Heart failure 2. COPD 3. Obesity-Hypoventilation syndrome w(/PaC02 results)  Recommends  APAP

## 2019-10-06 NOTE — Addendum Note (Signed)
Addended by: Freada Bergeron on: 10/06/2019 02:20 PM   Modules accepted: Orders

## 2019-10-06 NOTE — Telephone Encounter (Signed)
Patient is scheduled for CPAP Titration on 10/21/19. [place] is scheduled for COVID screening on 10/19/19 11:15 prior to titration.   Patient understands her titration study will be done at [facility] sleep lab. Patient understands she will receive a letter in a week or so detailing appointment, date, time, and location. Patient understands to call if she does not receive the letter  in a timely manner. Patient agrees with treatment and thanked me for call.

## 2019-10-06 NOTE — Telephone Encounter (Signed)
Patient called to say her husbands [employer] is going to end in December and she has [employer] on herself and to please use that to get her sleep study done. Patient states to call her if this coverage does not pass as [employer] did not pass.  cpap titration scheduled today.

## 2019-10-12 ENCOUNTER — Telehealth: Payer: Self-pay | Admitting: Cardiology

## 2019-10-12 NOTE — Telephone Encounter (Signed)
New Message  Patient is calling in to see what medication recommendations can be made for ADD/ADHD due to patient having to stop Ritalin due to it clashing with her heart medications. Please give patient a call back with ADD/ADHD medication recommendations.

## 2019-10-12 NOTE — Telephone Encounter (Signed)
Patient is calling for a suggestion of an ADHD medication that she can take that will not interact with her cardiac medications. She was previously on Ritalin. Patient has spoken with her PCP who advised her to contact cardiology to find out what her options are.

## 2019-10-12 NOTE — Telephone Encounter (Signed)
There is not really a good option with ADHD meds, but Eloy End might be the best option as it is not a stimulant.  Would wait for Dr. Marlou Porch opinion

## 2019-10-13 ENCOUNTER — Telehealth (HOSPITAL_COMMUNITY): Payer: Self-pay

## 2019-10-13 NOTE — Telephone Encounter (Signed)
Patient is calling to let you know that she spoke to her heart doctor about medication for ADHA and he told her that she could take Strattera. Please review and advise, thank you

## 2019-10-13 NOTE — Telephone Encounter (Signed)
I like the suggestion of Stratterra. Thanks  Candee Furbish, MD

## 2019-10-13 NOTE — Telephone Encounter (Signed)
Lpm with Dr Marlou Porch' recommendation that she can start Stratterra.  I told her to call her PCP with the advisement.

## 2019-10-16 ENCOUNTER — Other Ambulatory Visit: Payer: Self-pay | Admitting: Family Medicine

## 2019-10-16 ENCOUNTER — Encounter: Payer: 59 | Attending: Physical Medicine and Rehabilitation | Admitting: Physical Medicine and Rehabilitation

## 2019-10-16 DIAGNOSIS — K219 Gastro-esophageal reflux disease without esophagitis: Secondary | ICD-10-CM

## 2019-10-16 DIAGNOSIS — G5703 Lesion of sciatic nerve, bilateral lower limbs: Secondary | ICD-10-CM | POA: Insufficient documentation

## 2019-10-16 DIAGNOSIS — I4819 Other persistent atrial fibrillation: Secondary | ICD-10-CM | POA: Insufficient documentation

## 2019-10-16 DIAGNOSIS — M0579 Rheumatoid arthritis with rheumatoid factor of multiple sites without organ or systems involvement: Secondary | ICD-10-CM | POA: Insufficient documentation

## 2019-10-16 DIAGNOSIS — M797 Fibromyalgia: Secondary | ICD-10-CM | POA: Insufficient documentation

## 2019-10-16 DIAGNOSIS — M7918 Myalgia, other site: Secondary | ICD-10-CM | POA: Insufficient documentation

## 2019-10-17 ENCOUNTER — Other Ambulatory Visit: Payer: Self-pay | Admitting: Physical Medicine and Rehabilitation

## 2019-10-17 ENCOUNTER — Other Ambulatory Visit (HOSPITAL_COMMUNITY): Payer: Self-pay | Admitting: Psychiatry

## 2019-10-17 MED ORDER — ATOMOXETINE HCL 25 MG PO CAPS: 25.0000 mg | ORAL_CAPSULE | Freq: Two times a day (BID) | ORAL | 2 refills | Status: DC

## 2019-10-17 NOTE — Telephone Encounter (Signed)
I have sent a prescription for Strattera to her pharmacy

## 2019-10-17 NOTE — Progress Notes (Signed)
Pt has spoke with her heart doctor and shares that Dawn Jordan is ok for treatment of ADHD. I will start it today

## 2019-10-17 NOTE — Telephone Encounter (Signed)
Called patient to let her know that the prescription is at the pharmacy

## 2019-10-19 ENCOUNTER — Other Ambulatory Visit (HOSPITAL_COMMUNITY): Admission: RE | Admit: 2019-10-19 | Payer: 59 | Source: Ambulatory Visit

## 2019-10-20 ENCOUNTER — Other Ambulatory Visit (HOSPITAL_COMMUNITY)
Admission: RE | Admit: 2019-10-20 | Discharge: 2019-10-20 | Disposition: A | Discharge status: Home / Self Care | Payer: 59 | Source: Ambulatory Visit | Attending: Cardiology | Admitting: Cardiology

## 2019-10-20 DIAGNOSIS — Z20828 Contact with and (suspected) exposure to other viral communicable diseases: Secondary | ICD-10-CM | POA: Diagnosis not present

## 2019-10-20 DIAGNOSIS — Z01812 Encounter for preprocedural laboratory examination: Secondary | ICD-10-CM | POA: Insufficient documentation

## 2019-10-21 ENCOUNTER — Other Ambulatory Visit: Payer: Self-pay

## 2019-10-21 ENCOUNTER — Ambulatory Visit (HOSPITAL_BASED_OUTPATIENT_CLINIC_OR_DEPARTMENT_OTHER): Payer: 59 | Attending: Cardiology | Admitting: Cardiology

## 2019-10-21 DIAGNOSIS — G4733 Obstructive sleep apnea (adult) (pediatric): Secondary | ICD-10-CM | POA: Insufficient documentation

## 2019-10-21 LAB — SARS CORONAVIRUS 2 (TAT 6-24 HRS): SARS Coronavirus 2: NEGATIVE

## 2019-10-23 ENCOUNTER — Telehealth: Payer: Self-pay

## 2019-10-23 NOTE — Telephone Encounter (Signed)
-----   Message from Sueanne [name], MD sent at 10/21/2019 10:57 PM EST ----- Please let patient know that labs were normal.  Continue current medical therapy.

## 2019-10-23 NOTE — Telephone Encounter (Signed)
Notes recorded by Frederik Schmidt, RN on 10/23/2019 at 8:27 AM EST  The patient has been notified of the result and verbalized understanding. All questions (if any) were answered.  Frederik Schmidt, RN 10/23/2019 8:27 AM

## 2019-10-29 NOTE — Procedures (Signed)
    Patient Name: Dawn Jordan, Dawn Jordan Date: 10/21/2019 Gender: Female D.O.B: 1966/07/26 Age (years): 41 Referring Provider: Jerline Pain Height (inches): 64 Interpreting Physician: Fransico Him MD, ABSM Weight (lbs): 241 RPSGT: Lanae Boast BMI: 41 MRN: [id number] Neck Size: 16.50  CLINICAL INFORMATION The patient is referred for a BiPAP titration to treat sleep apnea.  SLEEP STUDY TECHNIQUE As per the AASM Manual for the Scoring of Sleep and Associated Events v2.3 (April 2016) with a hypopnea requiring 4% desaturations.  The channels recorded and monitored were frontal, central and occipital EEG, electrooculogram (EOG), submentalis EMG (chin), nasal and oral airflow, thoracic and abdominal wall motion, anterior tibialis EMG, snore microphone, electrocardiogram, and pulse oximetry. Bilevel positive airway pressure (BPAP) was initiated at the beginning of the study and titrated to treat sleep-disordered breathing.  MEDICATIONS Medications self-administered by patient taken the night of the study : N/A  RESPIRATORY PARAMETERS Optimal IPAP Pressure (cm): N/A  AHI at Optimal Pressure (/hr) N/A Optimal EPAP Pressure (cm):N/A  Overall Minimal O2 (%):70.0  Minimal O2 at Optimal Pressure (%): N/A  SLEEP ARCHITECTURE Start Time:10:58:04 PM  Stop Time:5:08:15 AM  Total Time (min):370.2  Total Sleep Time (min):269.2 Sleep Latency (min):46.9  Sleep Efficiency (%):72.7%  REM Latency (min): 84.0  WASO (min): 54.0 Stage N1 (%): 11.9%  Stage N2 (%): 61.2%  Stage N3 (%): 8.4% Stage R (%):18.6 Supine (%):100.00 Arousal Index (/hr):12.5   CARDIAC DATA The 2 lead EKG demonstrated sinus rhythm. The mean heart rate was 76.5 beats per minute. Other EKG findings include: PVCs.  LEG MOVEMENT DATA The total Periodic Limb Movements of Sleep (PLMS) were 0. The PLMS index was 0.0. A PLMS index of <15 is considered normal in adults.  IMPRESSIONS - An optimal PAP pressure was not  selected for this patient due to ongoing respiratory events.   - Central sleep apnea was not noted during this titration (CAI = 0.0/h). - Severe oxygen desaturations were observed during this titration (min O2 = 70.0%). - The patient snored with moderate snoring volume. - 2-lead EKG demonstrated: PVCs - Clinically significant periodic limb movements were not noted during this study. Arousals associated with PLMs were rare.  DIAGNOSIS - Obstructive Sleep Apnea (327.23 [G47.33 ICD-10])  RECOMMENDATIONS - Trial of auto BiPAP therapy with IPAP max of 20cm H2O, EPAP min 6cm H2O and PS 5cm H2O and with a Medium size Philips Respironics Full Face Mask Dreamwear mask and heated humidification. - Avoid alcohol, sedatives and other CNS depressants that may worsen sleep apnea and disrupt normal sleep architecture. - Sleep hygiene should be reviewed to assess factors that may improve sleep quality. - Weight management and regular exercise should be initiated or continued. - Return to Sleep Center for re-evaluation after 10 weeks of therapy  [Electronically signed] 10/29/2019 10:11 PM  Fransico Him MD, ABSM Diplomate, American Board of Sleep Medicine

## 2019-10-31 ENCOUNTER — Telehealth: Payer: Self-pay | Admitting: *Deleted

## 2019-10-31 NOTE — Telephone Encounter (Signed)
Informed patient of sleep study results and patient understanding was verbalized. Patient understands her sleep study showed  they had a successful PAP titration and let DME know that orders are in EPIC. Upon patient request DME selection is [facility] Patient understands she will be contacted by [facility] to set up her cpap. Patient understands to call if [facility] does not contact her with new setup in a timely manner. Patient understands they will be called once confirmation has been received from [facility] that they have received their new machine to schedule 10 week follow up appointment.  [facility] notified of new cpap order via community message Please add to [facility] Patient was grateful for the call and thanked me.

## 2019-10-31 NOTE — Telephone Encounter (Signed)
-----   Message from Dawn [name], MD sent at 10/29/2019 10:18 PM EST ----- Please let patient know that they had a successful PAP titration and let DME know that orders are in EPIC.  Please set up 10 week OV with me.

## 2019-11-10 ENCOUNTER — Other Ambulatory Visit (HOSPITAL_COMMUNITY): Payer: Self-pay | Admitting: Psychiatry

## 2019-11-10 ENCOUNTER — Other Ambulatory Visit: Payer: Self-pay | Admitting: Physical Medicine and Rehabilitation

## 2019-11-16 ENCOUNTER — Other Ambulatory Visit: Payer: Self-pay

## 2019-11-17 ENCOUNTER — Ambulatory Visit (INDEPENDENT_AMBULATORY_CARE_PROVIDER_SITE_OTHER): Payer: 59 | Admitting: Family Medicine

## 2019-11-17 ENCOUNTER — Encounter: Payer: Self-pay | Admitting: Family Medicine

## 2019-11-17 VITALS — BP 130/80 | HR 71 | Resp 12 | Ht 64.0 in | Wt 243.0 lb

## 2019-11-17 DIAGNOSIS — M549 Dorsalgia, unspecified: Secondary | ICD-10-CM

## 2019-11-17 DIAGNOSIS — I209 Angina pectoris, unspecified: Secondary | ICD-10-CM | POA: Diagnosis not present

## 2019-11-17 DIAGNOSIS — M797 Fibromyalgia: Secondary | ICD-10-CM

## 2019-11-17 MED ORDER — TIZANIDINE HCL 4 MG PO TABS: 4.0000 mg | ORAL_TABLET | Freq: Three times a day (TID) | ORAL | 0 refills | Status: AC | PRN

## 2019-11-17 NOTE — Progress Notes (Signed)
ACUTE VISIT   HPI:  Chief Complaint  Patient presents with  . pain in back    mid to lower back upon sleeping & waking up, lasts about 30 minutes after waking up - on both sides of the lungs.    Dawn Jordan is a 53 y.o. female, who is here today complaining of upper back pain,bilateral. Started since she was dx'ed with COVID 19 infection. It is slowly getting better. Pain is dull, "not bad." Exacerbated by deep breathing and certain movements. It seems to be worse in the morning when in bed.  No associated SOB,cough,or wheezing. Negative for fever,chills,sore throat,dysphagia,or skin rash. She has not taken OTC treatments.  Hx of fibromyalgia,she is on chronic opoid treatment.   Review of Systems  Constitutional: Positive for fatigue. Negative for activity change, appetite change, fever and unexpected weight change.  HENT: Negative for mouth sores, nosebleeds and trouble swallowing.   Cardiovascular: Negative for chest pain, palpitations and leg swelling.  Gastrointestinal: Negative for abdominal pain, nausea and vomiting.       Negative for changes in bowel habits.  Genitourinary: Negative for decreased urine volume and hematuria.  Musculoskeletal: Positive for myalgias. Negative for gait problem.  Neurological: Negative for syncope, weakness and headaches.  Rest see pertinent positives and negatives per HPI.   Current Outpatient Medications on File Prior to Visit  Medication Sig Dispense Refill  . acetaminophen (TYLENOL) 500 MG tablet Take 1 tablet (500 mg total) by mouth every 6 (six) hours as needed. 30 tablet 0  . ACTEMRA 162 MG/0.9ML SOSY     . aspirin EC 81 MG tablet Take 1 tablet (81 mg total) by mouth daily. 90 tablet 3  . atomoxetine (STRATTERA) 25 MG capsule Take 1 capsule (25 mg total) by mouth 2 (two) times daily. 60 capsule 2  . budesonide-formoterol (SYMBICORT) 160-4.5 MCG/ACT inhaler TAKE 2 PUFFS BY MOUTH TWICE A DAY 3 Inhaler 0  . clonazePAM  (KLONOPIN) 0.5 MG tablet Take 1 tablet (0.5 mg total) by mouth 3 (three) times daily as needed for anxiety. 90 tablet 2  . diclofenac sodium (VOLTAREN) 1 % GEL Apply 1 application topically 2 (two) times daily as needed (back and knee pain).     . DULoxetine (CYMBALTA) 60 MG capsule TAKE 1 CAPSULE (60 MG TOTAL) BY MOUTH AT BEDTIME. 90 capsule 4  . EPINEPHrine (EPIPEN 2-PAK) 0.3 mg/0.3 mL IJ SOAJ injection Use for life threatening allergic reactions 2 Device 6  . flecainide (TAMBOCOR) 50 MG tablet Take 1 tablet (50 mg total) by mouth 2 (two) times daily. 60 tablet 11  . HYDROcodone-acetaminophen (NORCO/VICODIN) 5-325 MG tablet Take 1 tablet by mouth every 6 (six) hours as needed for moderate pain.    Marland Kitchen levocetirizine (XYZAL) 5 MG tablet Take 1 tablet (5 mg total) by mouth every evening. 30 tablet 0  . lidocaine (XYLOCAINE) 5 % ointment Apply 1 application topically 4 (four) times daily as needed. To affected areas/panful areas- no more than dime sized dollop at a time. 50 g 11  . lithium carbonate 150 MG capsule TAKE 1 CAPSULE BY MOUTH EVERY DAY 90 capsule 0  . metoprolol tartrate (LOPRESSOR) 25 MG tablet Take 25 mg by mouth 2 (two) times daily.    . montelukast (SINGULAIR) 10 MG tablet Take 1 tablet (10 mg total) by mouth at bedtime. 30 tablet 1  . nabumetone (RELAFEN) 500 MG tablet Take 500 mg by mouth 3 (three) times daily.  3  .  omeprazole (PRILOSEC) 40 MG capsule TAKE 1 CAPSULE BY MOUTH EVERY DAY 30 MINUTES BEFORE BREAKFAST 90 capsule 0  . pregabalin (LYRICA) 200 MG capsule Take 200 mg by mouth 2 (two) times daily.     . traMADol (ULTRAM) 50 MG tablet Take 50 mg by mouth every 8 (eight) hours as needed.    . VENTOLIN HFA 108 (90 Base) MCG/ACT inhaler INHALE 2 PUFFS INTO THE LUNGS EVERY 4 HOURS AS NEEDED FOR WHEEZING/SHORTNESS OF BREATH 6.7 g 0  . Vilazodone HCl (VIIBRYD) 20 MG TABS Take 1 tablet (20 mg total) by mouth daily. 90 tablet 0  . nitroGLYCERIN (NITROSTAT) 0.4 MG SL tablet Place 1  tablet (0.4 mg total) under the tongue every 5 (five) minutes as needed for chest pain. 25 tablet 6   Current Facility-Administered Medications on File Prior to Visit  Medication Dose Route Frequency Provider Last Rate Last Admin  . 0.9 %  sodium chloride infusion  500 mL Intravenous Continuous Milus Banister, MD         Past Medical History:  Diagnosis Date  . Anemia   . Anginal pain ([place])   . Anxiety   . Asthma   . Chronic fatigue syndrome   . COPD (chronic obstructive pulmonary disease) ([place])   . Depression   . Fibromyalgia   . GERD (gastroesophageal reflux disease)   . Headache    "weekly-monthly" (06/04/2017)  . History of gout   . History of kidney stones   . Left sciatic nerve pain   . OSA (obstructive sleep apnea)    "getting ready to retest" (06/04/2017)  . Rheumatoid arthritis ([place])   . Seasonal allergies    Allergies  Allergen Reactions  . Eggs Or Egg-Derived Products Diarrhea  . Latex Anaphylaxis, Hives and Itching    "Anaphylaxis is only when I am in a closed area (ex: car with balloons)"  . Other Other (See Comments)    Sinus headache from new plastics, carpets, etc.  . Codeine     Headaches     Social History   Socioeconomic History  . Marital status: Legally Separated    Spouse name: Not on file  . Number of children: 0  . Years of education: Not on file  . Highest education level: Not on file  Occupational History  . Occupation: not employed-disabled  Tobacco Use  . Smoking status: Never Smoker  . Smokeless tobacco: Never Used  Substance and Sexual Activity  . Alcohol use: No  . Drug use: No  . Sexual activity: Never  Other Topics Concern  . Not on file  Social History Narrative   Born in [place], [place] in [place]"   Unemployed, on disability since 1991 for anxiety,    Education: [employer]   Single, no children, lives with her friend (used to live with her mother with stroke, now in ALF)      Social Determinants of  Health   Financial Resource Strain:   . Difficulty of Paying Living Expenses: Not on file  Food Insecurity:   . Worried About Charity fundraiser in the Last Year: Not on file  . Ran Out of Food in the Last Year: Not on file  Transportation Needs:   . Lack of Transportation (Medical): Not on file  . Lack of Transportation (Non-Medical): Not on file  Physical Activity:   . Days of Exercise per Week: Not on file  . Minutes of Exercise per Session: Not on file  Stress:   .  Feeling of Stress : Not on file  Social Connections:   . Frequency of Communication with Friends and Family: Not on file  . Frequency of Social Gatherings with Friends and Family: Not on file  . Attends Religious Services: Not on file  . Active Member of Clubs or Organizations: Not on file  . Attends Archivist Meetings: Not on file  . Marital Status: Not on file    Vitals:   11/17/19 0831  BP: 130/80  Pulse: 71  Resp: 12  SpO2: 100%   Body mass index is 41.71 kg/m.   Physical Exam  Nursing note and vitals reviewed. Constitutional: She is oriented to person, place, and time. She appears well-developed. No distress.  HENT:  Head: Normocephalic and atraumatic.  Mouth/Throat: Oropharynx is clear and moist and mucous membranes are normal.  Eyes: Pupils are equal, round, and reactive to light. Conjunctivae are normal.  Cardiovascular: Normal rate and regular rhythm.  No murmur heard. Respiratory: Effort normal and breath sounds normal. No respiratory distress.  GI: Soft. She exhibits no mass. There is no hepatomegaly. There is no abdominal tenderness.  Musculoskeletal:        General: No edema.     Thoracic back: Tenderness present. No bony tenderness.       Back:     Comments: + Trigger points in back,chest wall, rib cage bilateral,and extremities  Lymphadenopathy:    She has no cervical adenopathy.  Neurological: She is alert and oriented to person, place, and time. She has normal strength.  No cranial nerve deficit. Gait normal.  Skin: Skin is warm. No rash noted. No erythema.  Psychiatric: Her mood appears anxious.  Well groomed, good eye contact.    ASSESSMENT AND PLAN:  Dawn Jordan was seen today for pain in back.  Diagnoses and all orders for this visit:  Upper back pain It seems to be muscle related. Hx and examination today do not suggest a serious process or pneumonia. We have X ray service,she prefers to hold on further work up. Continue monitoring. Local massage may help. OTC asper cream or Biofreeze. Monitor for worsening symptoms and fever among some. Zanaflex 4 mg at bedtime x 2-3 weeks then prn. Some side effects discussed. Instructed about warning signs.  -     tiZANidine (ZANAFLEX) 4 MG tablet; Take 1 tablet (4 mg total) by mouth every 8 (eight) hours as needed for up to 15 days for muscle spasms.  Fibromyalgia syndrome Back pain could be related to this problem. Continue current management. Low impact physical activity and good sleep hygiene are important part of treatment.  Return if symptoms worsen or fail to improve.     G. Martinique, MD  [facility]. [place] office.

## 2019-11-17 NOTE — Patient Instructions (Addendum)
A few things to remember from today's visit:   Upper back pain  I think it is muscle related. Monitor for new symptoms.  Please be sure medication list is accurate. If a new problem present, please set up appointment sooner than planned today.

## 2019-11-23 ENCOUNTER — Encounter (HOSPITAL_COMMUNITY): Payer: Self-pay | Admitting: Psychiatry

## 2019-11-23 ENCOUNTER — Ambulatory Visit (INDEPENDENT_AMBULATORY_CARE_PROVIDER_SITE_OTHER): Payer: 59 | Admitting: Psychiatry

## 2019-11-23 ENCOUNTER — Other Ambulatory Visit: Payer: Self-pay

## 2019-11-23 DIAGNOSIS — F331 Major depressive disorder, recurrent, moderate: Secondary | ICD-10-CM | POA: Diagnosis not present

## 2019-11-23 DIAGNOSIS — F900 Attention-deficit hyperactivity disorder, predominantly inattentive type: Secondary | ICD-10-CM

## 2019-11-23 DIAGNOSIS — F4310 Post-traumatic stress disorder, unspecified: Secondary | ICD-10-CM | POA: Diagnosis not present

## 2019-11-23 DIAGNOSIS — Z7982 Long term (current) use of aspirin: Secondary | ICD-10-CM

## 2019-11-23 MED ORDER — LITHIUM CARBONATE 150 MG PO CAPS: ORAL_CAPSULE | ORAL | 0 refills | Status: DC

## 2019-11-23 MED ORDER — VIIBRYD 20 MG PO TABS: 20.0000 mg | ORAL_TABLET | Freq: Every day | ORAL | 0 refills | Status: DC

## 2019-11-23 MED ORDER — ATOMOXETINE HCL 25 MG PO CAPS: 25.0000 mg | ORAL_CAPSULE | Freq: Two times a day (BID) | ORAL | 0 refills | Status: DC

## 2019-11-23 MED ORDER — CLONAZEPAM 0.5 MG PO TABS: 0.5000 mg | ORAL_TABLET | Freq: Three times a day (TID) | ORAL | 2 refills | Status: DC | PRN

## 2019-11-23 NOTE — Progress Notes (Signed)
  Virtual Visit via Telephone Note  I connected with Dawn Jordan  on 11/23/19 at  2:30 PM EST by telephone and verified that I am speaking with the correct person using two identifiers.  Location: Patient: home Provider: office   I discussed the limitations, risks, security and privacy concerns of performing an evaluation and management service by telephone and the availability of in person appointments. I also discussed with the patient that there may be a patient responsible charge related to this service. The patient expressed understanding and agreed to proceed.   History of Present Illness: Aylia states she is feeling better now. She had COVID in late October. She was very sick for about 2.5 weeks. During that time she was not able to eat much and stopped taking her meds. Now she admits to feeling some depression due to ongoing pandemic and other stressors. She is not sleeping well due to sleep apnea. Ariah does not like the sleep mask. Her PTSD is ongoing. When stressed she will have random intrusive memories. HV continues unchanged. Right now she is working on de-cluttering her home. She denies SI/HI. Strattera helps with her focus. She just now started taking the afternoon dose. Roquel spent some time talking about her struggles in school as a child and how it is causing anxiety about taking classes now.    Observations/Objective: I spoke with [name] on the phone.  Pt was calm, pleasant and cooperative.  Pt was engaged in the conversation and answered questions appropriately.  Speech was clear and coherent with normal rate, tone and volume.  Mood is depressed and anxious, affect is congruent. Thought processes are coherent and circumstantial.  Thought content is logical.  Pt denies SI/HI.   Pt denies auditory and visual hallucinations and did not appear to be responding to internal stimuli.  Memory and concentration are good.  Fund of knowledge and use of language are average.  Insight and  judgment are fair.  I am unable to comment on psychomotor activity, general appearance, hygiene, or eye contact as I was unable to physically see the patient on the phone.  Vital signs not available since interview conducted virtually.     Assessment and Plan: PTSD; MDD-recurrent, moderate; ADHD-inattentive type  Status of current symptoms: stable  Klonopin 0.5mg  po TID prn anxiety Lithobid 150mg  po qD Viibryd 20mg  po qD Strattera 25mg  po BID  Follow Up Instructions: In 12 weeks or sooner if needed   I discussed the assessment and treatment plan with the patient. The patient was provided an opportunity to ask questions and all were answered. The patient agreed with the plan and demonstrated an understanding of the instructions.   The patient was advised to call back or seek an in-person evaluation if the symptoms worsen or if the condition fails to improve as anticipated.  I provided 25 minutes of non-face-to-face time during this encounter.   Charlcie Cradle, MD

## 2019-12-04 ENCOUNTER — Encounter: Payer: Self-pay | Admitting: Family Medicine

## 2019-12-04 ENCOUNTER — Ambulatory Visit (INDEPENDENT_AMBULATORY_CARE_PROVIDER_SITE_OTHER): Payer: 59 | Admitting: Family Medicine

## 2019-12-04 DIAGNOSIS — G4733 Obstructive sleep apnea (adult) (pediatric): Secondary | ICD-10-CM

## 2019-12-04 DIAGNOSIS — M797 Fibromyalgia: Secondary | ICD-10-CM | POA: Diagnosis not present

## 2019-12-04 DIAGNOSIS — I4819 Other persistent atrial fibrillation: Secondary | ICD-10-CM | POA: Diagnosis not present

## 2019-12-04 DIAGNOSIS — I209 Angina pectoris, unspecified: Secondary | ICD-10-CM

## 2019-12-04 MED ORDER — TIZANIDINE HCL 4 MG PO TABS: 4.0000 mg | ORAL_TABLET | Freq: Two times a day (BID) | ORAL | 3 refills | Status: DC | PRN

## 2019-12-04 NOTE — Progress Notes (Signed)
HPI:   Dawn Jordan is a 53 y.o. female, who is here today to follow on recent OV and for chronic diease management. She was last seen on 11/17/2019, when she was complaining about upper back pain,persistent after recovering from COVID 19 infection.  She was started on Zanaflex, which she has tolerated well and helping with musculoskeletal pain.  Pain has greatly improved. She has not had any other associated  Medication helps with fibromyalgia, so she would like to continue Zanaflex. Negative for fever, chills, cough, dyspnea, or wheezing.  She follows with pain management.  She is also on Lyrica 200 mg twice daily, nabumetone 500 mg 3 times daily, and hydrocodone-acetaminophen 5-325 mg every 6 hours as needed for pain.  -She has not been exercising regularly nor following a healthful diet. She is planning on changing dietary habits in 12/2019. Lately she has been drinking milkshakes and eating fried chicken. She loves vegetables and able to cook healthy food but has not done so for the past few months.  Following with psychiatrist for ADHD and PTSD,no concerns in this regard.  -OSA, she follows with Dr. Radford Pax, last seen on 10/21/2019. She reported BiPAP as instructed.  -Atrial fibrillation. She follows with cardiologist. Currently she is on Tambocor 50 mg twice daily and Aspirin 81 mg daily.. She has not noted CP,palpitations,orthopnea,or PND.   Review of Systems  Constitutional: Positive for fatigue. Negative for activity change and appetite change.  HENT: Negative for mouth sores, nosebleeds and sore throat.   Eyes: Negative for redness and visual disturbance.  Cardiovascular: Negative for leg swelling.  Gastrointestinal: Negative for abdominal pain, nausea and vomiting.       Negative for changes in bowel habits.  Musculoskeletal: Positive for arthralgias and myalgias. Negative for gait problem.  Skin: Negative for rash.  Neurological: Negative for syncope,  weakness and headaches.  Rest see pertinent positives and negatives per HPI.   Current Outpatient Medications on File Prior to Visit  Medication Sig Dispense Refill  . acetaminophen (TYLENOL) 500 MG tablet Take 1 tablet (500 mg total) by mouth every 6 (six) hours as needed. 30 tablet 0  . ACTEMRA 162 MG/0.9ML SOSY     . aspirin EC 81 MG tablet Take 1 tablet (81 mg total) by mouth daily. 90 tablet 3  . atomoxetine (STRATTERA) 25 MG capsule Take 1 capsule (25 mg total) by mouth 2 (two) times daily. 180 capsule 0  . budesonide-formoterol (SYMBICORT) 160-4.5 MCG/ACT inhaler TAKE 2 PUFFS BY MOUTH TWICE A DAY 3 Inhaler 0  . clonazePAM (KLONOPIN) 0.5 MG tablet Take 1 tablet (0.5 mg total) by mouth 3 (three) times daily as needed for anxiety. 90 tablet 2  . diclofenac sodium (VOLTAREN) 1 % GEL Apply 1 application topically 2 (two) times daily as needed (back and knee pain).     Marland Kitchen EPINEPHrine (EPIPEN 2-PAK) 0.3 mg/0.3 mL IJ SOAJ injection Use for life threatening allergic reactions 2 Device 6  . flecainide (TAMBOCOR) 50 MG tablet Take 1 tablet (50 mg total) by mouth 2 (two) times daily. 60 tablet 11  . HYDROcodone-acetaminophen (NORCO/VICODIN) 5-325 MG tablet Take 1 tablet by mouth every 6 (six) hours as needed for moderate pain.    Marland Kitchen levocetirizine (XYZAL) 5 MG tablet Take 1 tablet (5 mg total) by mouth every evening. 30 tablet 0  . lidocaine (XYLOCAINE) 5 % ointment Apply 1 application topically 4 (four) times daily as needed. To affected areas/panful areas- no more than dime sized  dollop at a time. 50 g 11  . lithium carbonate 150 MG capsule TAKE 1 CAPSULE BY MOUTH EVERY DAY 90 capsule 0  . metoprolol tartrate (LOPRESSOR) 25 MG tablet Take 25 mg by mouth 2 (two) times daily.    . montelukast (SINGULAIR) 10 MG tablet Take 1 tablet (10 mg total) by mouth at bedtime. 30 tablet 1  . nabumetone (RELAFEN) 500 MG tablet Take 500 mg by mouth 3 (three) times daily.  3  . omeprazole (PRILOSEC) 40 MG capsule  TAKE 1 CAPSULE BY MOUTH EVERY DAY 30 MINUTES BEFORE BREAKFAST 90 capsule 0  . pregabalin (LYRICA) 200 MG capsule Take 200 mg by mouth 2 (two) times daily.     . traMADol (ULTRAM) 50 MG tablet Take 50 mg by mouth every 8 (eight) hours as needed.    . VENTOLIN HFA 108 (90 Base) MCG/ACT inhaler INHALE 2 PUFFS INTO THE LUNGS EVERY 4 HOURS AS NEEDED FOR WHEEZING/SHORTNESS OF BREATH 6.7 g 0  . Vilazodone HCl (VIIBRYD) 20 MG TABS Take 1 tablet (20 mg total) by mouth daily. 90 tablet 0  . nitroGLYCERIN (NITROSTAT) 0.4 MG SL tablet Place 1 tablet (0.4 mg total) under the tongue every 5 (five) minutes as needed for chest pain. 25 tablet 6   Current Facility-Administered Medications on File Prior to Visit  Medication Dose Route Frequency Provider Last Rate Last Admin  . 0.9 %  sodium chloride infusion  500 mL Intravenous Continuous Milus Banister, MD         Past Medical History:  Diagnosis Date  . Anemia   . Anginal pain ([place])   . Anxiety   . Asthma   . Chronic fatigue syndrome   . COPD (chronic obstructive pulmonary disease) ([place])   . Depression   . Fibromyalgia   . GERD (gastroesophageal reflux disease)   . Headache    "weekly-monthly" (06/04/2017)  . History of gout   . History of kidney stones   . Left sciatic nerve pain   . OSA (obstructive sleep apnea)    "getting ready to retest" (06/04/2017)  . Rheumatoid arthritis ([place])   . Seasonal allergies    Allergies  Allergen Reactions  . Eggs Or Egg-Derived Products Diarrhea  . Latex Anaphylaxis, Hives and Itching    "Anaphylaxis is only when I am in a closed area (ex: car with balloons)"  . Other Other (See Comments)    Sinus headache from new plastics, carpets, etc.  . Codeine     Headaches     Social History   Socioeconomic History  . Marital status: Legally Separated    Spouse name: Not on file  . Number of children: 0  . Years of education: Not on file  . Highest education level: Not on file  Occupational History  .  Occupation: not employed-disabled  Tobacco Use  . Smoking status: Never Smoker  . Smokeless tobacco: Never Used  Substance and Sexual Activity  . Alcohol use: No  . Drug use: No  . Sexual activity: Never  Other Topics Concern  . Not on file  Social History Narrative   Born in [place], [place] in [place]"   Unemployed, on disability since 1991 for anxiety,    Education: [employer]   Single, no children, lives with her friend (used to live with her mother with stroke, now in ALF)      Social Determinants of Health   Financial Resource Strain:   . Difficulty of Paying Living Expenses:  Not on file  Food Insecurity:   . Worried About Charity fundraiser in the Last Year: Not on file  . Ran Out of Food in the Last Year: Not on file  Transportation Needs:   . Lack of Transportation (Medical): Not on file  . Lack of Transportation (Non-Medical): Not on file  Physical Activity:   . Days of Exercise per Week: Not on file  . Minutes of Exercise per Session: Not on file  Stress:   . Feeling of Stress : Not on file  Social Connections:   . Frequency of Communication with Friends and Family: Not on file  . Frequency of Social Gatherings with Friends and Family: Not on file  . Attends Religious Services: Not on file  . Active Member of Clubs or Organizations: Not on file  . Attends Archivist Meetings: Not on file  . Marital Status: Not on file    Vitals:   12/04/19 0841  BP: 128/80  Pulse: 73  Resp: 16  Temp: (!) 95 F (35 C)  SpO2: 100%   Wt Readings from Last 3 Encounters:  12/04/19 249 lb (112.9 kg)  11/17/19 243 lb (110.2 kg)  10/21/19 245 lb (111.1 kg)    Body mass index is 42.74 kg/m.  Physical Exam  Nursing note and vitals reviewed. Constitutional: She is oriented to person, place, and time. She appears well-developed. No distress.  HENT:  Head: Normocephalic and atraumatic.  Mouth/Throat: Oropharynx is clear and moist and mucous  membranes are normal.  Eyes: Pupils are equal, round, and reactive to light. Conjunctivae are normal.  Cardiovascular: Normal rate and regular rhythm.  No murmur heard. Pulses:      Dorsalis pedis pulses are 2+ on the right side and 2+ on the left side.  Respiratory: Effort normal and breath sounds normal. No respiratory distress.  GI: Soft. She exhibits no mass. There is no hepatomegaly. There is no abdominal tenderness.  Musculoskeletal:        General: No edema.     Comments: Trigger tender points on upper,thoracic,and lower back.Also chest and extremities.  Lymphadenopathy:    She has no cervical adenopathy.  Neurological: She is alert and oriented to person, place, and time. She has normal strength. No cranial nerve deficit. Gait normal.  Skin: Skin is warm. No rash noted. No erythema.  Psychiatric: She has a normal mood and affect.  Well groomed, good eye contact.    ASSESSMENT AND PLAN:  Ms. Marguerette was seen today for follow-up.  Diagnoses and all orders for this visit:  Morbid (severe) obesity due to excess calories [facility]) We discussed the importance of following a healthful diet and engaging in regular physical activity.  She is planning on doing so at the beginning of next year.  Because fibromyalgia, she needs low impact exercise.  Fibromyalgia syndrome Good sleep hygiene and low impact physical activity. Zanaflex help with myalgias, so she will continue 4 mg twice daily as needed.  We discussed some side effects. No changes in the rest of her medications. Following with pain management.  -     tiZANidine (ZANAFLEX) 4 MG tablet; Take 1 tablet (4 mg total) by mouth every 12 (twelve) hours as needed for muscle spasms.  Persistent atrial fibrillation ([facility]) Rhythm and rate controlled. Caution with chronic NSAIDs use. Follow with cardiologist, Dr. Marlou Porch.  OSA (obstructive sleep apnea) On BiPAP. Weight loss definitely will help with this problem. Following with Dr.  Radford Pax, cardiologist.  In regard  to health maintenance, she needs her Pap smear.  She will arrange appointment with her gynecologist.  Return in about 1 year (around 12/03/2020) for cpe.   Waldemar Siegel G. Martinique, MD  [facility]. [place] office.

## 2019-12-04 NOTE — Patient Instructions (Addendum)
A few things to remember from today's visit:   Morbid (severe) obesity due to excess calories (HCC)  Fibromyalgia syndrome  You need to schedule a pap smear.  Please be sure medication list is accurate. If a new problem present, please set up appointment sooner than planned today.

## 2019-12-22 DIAGNOSIS — M797 Fibromyalgia: Secondary | ICD-10-CM | POA: Diagnosis not present

## 2019-12-22 DIAGNOSIS — M0609 Rheumatoid arthritis without rheumatoid factor, multiple sites: Secondary | ICD-10-CM | POA: Diagnosis not present

## 2019-12-22 DIAGNOSIS — Z79899 Other long term (current) drug therapy: Secondary | ICD-10-CM | POA: Diagnosis not present

## 2019-12-22 DIAGNOSIS — M2550 Pain in unspecified joint: Secondary | ICD-10-CM | POA: Diagnosis not present

## 2019-12-26 ENCOUNTER — Ambulatory Visit (INDEPENDENT_AMBULATORY_CARE_PROVIDER_SITE_OTHER): Payer: Medicare Other | Admitting: Family Medicine

## 2019-12-26 ENCOUNTER — Other Ambulatory Visit: Payer: Self-pay | Admitting: Family Medicine

## 2019-12-26 ENCOUNTER — Other Ambulatory Visit: Payer: Self-pay

## 2019-12-26 ENCOUNTER — Encounter (INDEPENDENT_AMBULATORY_CARE_PROVIDER_SITE_OTHER): Payer: Self-pay | Admitting: Family Medicine

## 2019-12-26 VITALS — BP 127/84 | HR 82 | Temp 98.1°F | Ht 64.0 in | Wt 243.0 lb

## 2019-12-26 DIAGNOSIS — D509 Iron deficiency anemia, unspecified: Secondary | ICD-10-CM

## 2019-12-26 DIAGNOSIS — R0602 Shortness of breath: Secondary | ICD-10-CM

## 2019-12-26 DIAGNOSIS — G4733 Obstructive sleep apnea (adult) (pediatric): Secondary | ICD-10-CM

## 2019-12-26 DIAGNOSIS — E559 Vitamin D deficiency, unspecified: Secondary | ICD-10-CM

## 2019-12-26 DIAGNOSIS — Z0289 Encounter for other administrative examinations: Secondary | ICD-10-CM

## 2019-12-26 DIAGNOSIS — R5383 Other fatigue: Secondary | ICD-10-CM

## 2019-12-26 DIAGNOSIS — I10 Essential (primary) hypertension: Secondary | ICD-10-CM

## 2019-12-26 DIAGNOSIS — F418 Other specified anxiety disorders: Secondary | ICD-10-CM | POA: Diagnosis not present

## 2019-12-26 DIAGNOSIS — R739 Hyperglycemia, unspecified: Secondary | ICD-10-CM

## 2019-12-26 DIAGNOSIS — Z6841 Body Mass Index (BMI) 40.0 and over, adult: Secondary | ICD-10-CM | POA: Diagnosis not present
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
## 2019-12-27 LAB — COMPREHENSIVE METABOLIC PANEL
ALT: 33 IU/L — ABNORMAL HIGH (ref 0–32)
AST: 28 IU/L (ref 0–40)
Albumin/Globulin Ratio: 2.2 (ref 1.2–2.2)
Albumin: 4.3 g/dL (ref 3.8–4.9)
Alkaline Phosphatase: 93 IU/L (ref 39–117)
BUN/Creatinine Ratio: 19 (ref 9–23)
BUN: 13 mg/dL (ref 6–24)
Bilirubin Total: 0.3 mg/dL (ref 0.0–1.2)
CO2: 24 mmol/L (ref 20–29)
Calcium: 9.5 mg/dL (ref 8.7–10.2)
Chloride: 107 mmol/L — ABNORMAL HIGH (ref 96–106)
Creatinine, Ser: 0.68 mg/dL (ref 0.57–1.00)
GFR calc Af Amer: 115 mL/min/{1.73_m2} (ref >59)
GFR calc non Af Amer: 99 mL/min/{1.73_m2} (ref >59)
Globulin, Total: 2 g/dL (ref 1.5–4.5)
Glucose: 103 mg/dL — ABNORMAL HIGH (ref 65–99)
Potassium: 4.4 mmol/L (ref 3.5–5.2)
Sodium: 143 mmol/L (ref 134–144)
Total Protein: 6.3 g/dL (ref 6.0–8.5)

## 2019-12-27 LAB — CBC WITH DIFFERENTIAL/PLATELET
Basophils Absolute: 0.1 10*3/uL (ref 0.0–0.2)
Basos: 1 %
EOS (ABSOLUTE): 0.1 10*3/uL (ref 0.0–0.4)
Eos: 2 %
Hematocrit: 37 % (ref 34.0–46.6)
Hemoglobin: 12.2 g/dL (ref 11.1–15.9)
Immature Grans (Abs): 0 10*3/uL (ref 0.0–0.1)
Immature Granulocytes: 0 %
Lymphocytes Absolute: 1.3 10*3/uL (ref 0.7–3.1)
Lymphs: 29 %
MCH: 28.7 pg (ref 26.6–33.0)
MCHC: 33 g/dL (ref 31.5–35.7)
MCV: 87 fL (ref 79–97)
Monocytes Absolute: 0.4 10*3/uL (ref 0.1–0.9)
Monocytes: 8 %
Neutrophils Absolute: 2.8 10*3/uL (ref 1.4–7.0)
Neutrophils: 60 %
Platelets: 280 10*3/uL (ref 150–450)
RBC: 4.25 x10E6/uL (ref 3.77–5.28)
RDW: 12.1 % (ref 11.7–15.4)
WBC: 4.7 10*3/uL (ref 3.4–10.8)

## 2019-12-27 LAB — LIPID PANEL WITH LDL/HDL RATIO
Cholesterol, Total: 202 mg/dL — ABNORMAL HIGH (ref 100–199)
HDL: 42 mg/dL (ref >39)
LDL Chol Calc (NIH): 125 mg/dL — ABNORMAL HIGH (ref 0–99)
LDL/HDL Ratio: 3 ratio (ref 0.0–3.2)
Triglycerides: 198 mg/dL — ABNORMAL HIGH (ref 0–149)
VLDL Cholesterol Cal: 35 mg/dL (ref 5–40)

## 2019-12-27 LAB — T4, FREE: Free T4: 1.44 ng/dL (ref 0.82–1.77)

## 2019-12-27 LAB — VITAMIN D 25 HYDROXY (VIT D DEFICIENCY, FRACTURES): Vit D, 25-Hydroxy: 12.1 ng/mL — ABNORMAL LOW (ref 30.0–100.0)

## 2019-12-27 LAB — INSULIN, RANDOM: INSULIN: 36.5 u[IU]/mL — ABNORMAL HIGH (ref 2.6–24.9)

## 2019-12-27 LAB — HEMOGLOBIN A1C
Est. average glucose Bld gHb Est-mCnc: 120 mg/dL
Hgb A1c MFr Bld: 5.8 % — ABNORMAL HIGH (ref 4.8–5.6)

## 2019-12-27 LAB — TSH: TSH: 0.405 u[IU]/mL — ABNORMAL LOW (ref 0.450–4.500)

## 2019-12-27 LAB — T3: T3, Total: 103 ng/dL (ref 71–180)

## 2019-12-27 NOTE — Progress Notes (Signed)
Chief Complaint:   OBESITY Dawn Jordan (MR# [id number]) is a 54 y.o. female who presents for evaluation and treatment of obesity and related comorbidities. Current BMI is Body mass index is 41.71 kg/m.[name] has been struggling with her weight for many years and has been unsuccessful in either losing weight, maintaining weight loss, or reaching her healthy weight goal.  Jernie is currently in the action stage of change and ready to dedicate time achieving and maintaining a healthier weight. Marijke is interested in becoming our patient and working on intensive lifestyle modifications including (but not limited to) diet and exercise for weight loss.  Debie's habits were reviewed today and are as follows: Her family eats meals together, she thinks her family will eat healthier with her, her desired weight loss is 103 lbs, she has been heavy most of her life, she started gaining weight at age 75, her heaviest weight ever was 250 pounds, she craves pizza and soda, she snacks frequently in the evenings, she skips breakfast and lunch, she is frequently drinking liquids with calories, she frequently makes poor food choices, she frequently eats larger portions than normal and she struggles with emotional eating.  Depression Screen Acelynn's Food and Mood (modified PHQ-9) score was 26.  Depression screen PHQ 2/9 12/26/2019  Decreased Interest 3  Down, Depressed, Hopeless 3  PHQ - 2 Score 6  Altered sleeping 3  Tired, decreased energy 3  Change in appetite 3  Feeling bad or failure about yourself  3  Trouble concentrating 3  Moving slowly or fidgety/restless 3  Suicidal thoughts 2  PHQ-9 Score 26  Difficult doing work/chores Extremely dIfficult   Subjective:   Other fatigue. Karalyn denies daytime somnolence and admits to waking up still tired. Kiera generally gets 2-4 hours of sleep per night, and states that she does not sleep well most nights. Snoring is present. Apneic episodes are present. Epworth  Sleepiness Score is 11. [place] has a history of COPD and asthma. She is not on current medications.  SOB (shortness of breath) on exertion. Marnita notes increasing shortness of breath with exercising and seems to be worsening over time with weight gain. She notes getting out of breath sooner with activity than she used to. This has gotten worse recently. Alletta denies shortness of breath at rest or orthopnea.  Essential hypertension. Lovelyn's blood pressure is controlled today on medications. She is working on diet and weight loss.  BP Readings from Last 3 Encounters:  12/26/19 127/84  12/04/19 128/80  11/17/19 130/80   Lab Results  Component Value Date   CREATININE 0.68 12/26/2019   CREATININE 0.70 06/04/2017   CREATININE 0.7 03/25/2017   Iron deficiency anemia, unspecified iron deficiency anemia type. Callysta has a history of anemia. No recent labs. She does report fatigue.  CBC Latest Ref Rng & Units 12/26/2019 06/04/2017 04/06/2017  WBC 3.4 - 10.8 x10E3/uL 4.7 6.8 8.5  Hemoglobin 11.1 - 15.9 g/dL 12.2 12.1 12.7  Hematocrit 34.0 - 46.6 % 37.0 36.5 38.0  Platelets 150 - 450 x10E3/uL 280 277 285.0   Lab Results  Component Value Date   IRON 41 (L) 04/06/2017   TIBC 270 12/07/2014   FERRITIN 60.6 04/06/2017   Lab Results  Component Value Date   [id number] 04/06/2017   Vitamin D deficiency. Rashi is at high risk of low Vitamin D. She does report fatigue.  Obstructive sleep apnea syndrome. Janika has a diagnosis of sleep apnea. She is on BiPAP but struggles  to use regularly, especially after COVID-19 diagnosis.   Hyperglycemia. Geniva has a history of some elevated blood glucose readings without a diagnosis of diabetes. She admits to polyphagia. She has had some elevated glucoses in the past, is not certain if this was fasting.  Depression with anxiety. Darlean is struggling with emotional eating and using food for comfort to the extent that it is negatively impacting her health. She has been  working on behavior modification techniques to help reduce her emotional eating and has been somewhat successful. She shows no sign of suicidal or homicidal ideations. Janith has multiple chronic illnesses and pain syndromes she is dealing with. She notes significant emotional eating and she has a very high PHQ-9 of 26. She has done counseling in the past.  Assessment/Plan:   Other fatigue. Sucely does feel that her weight is causing her energy to be lower than it should be. Fatigue may be related to obesity, depression or many other causes. Labs will be ordered, and in the meanwhile, Kenja will focus on self care including making healthy food choices, increasing physical activity and focusing on stress reduction. EKG 12-Lead, Insulin, random, Lipid Panel With LDL/HDL Ratio, T3, T4, free, TSH ordered.  SOB (shortness of breath) on exertion. Carlecia does feel that she gets out of breath more easily that she used to when she exercises. Leilynn's shortness of breath appears to be obesity related and exercise induced. She has agreed to work on weight loss and gradually increase exercise to treat her exercise induced shortness of breath. Will continue to monitor closely. Insulin, random, Lipid Panel With LDL/HDL Ratio, T3, T4, free, TSH ordered.  Essential hypertension. Raihana is working on healthy weight loss and exercise to improve blood pressure control. We will watch for signs of hypotension as she continues her lifestyle modifications. Comprehensive metabolic panel, CBC with Differential/Platelet labs ordered.  Iron deficiency anemia, unspecified iron deficiency anemia type. Orders and follow up as documented in patient record. CBC with Differential/Platelet ordered.  Counseling  Iron is essential for our bodies to make red blood cells.  Reasons that someone may be deficient include: an iron-deficient diet (more likely in those following vegan or vegetarian diets), women with heavy menses, patients with GI  disorders or poor absorption, patients that have had bariatric surgery, frequent blood donors, patients with cancer, and patients with heart disease.    An iron supplement has been recommended. This is found over-the-counter.   Iron-rich foods include dark leafy greens, red and white meats, eggs, seafood, and beans.    Certain foods and drinks prevent your body from absorbing iron properly. Avoid eating these foods in the same meal as iron-rich foods or with iron supplements. These foods include: coffee, black tea, and red wine; milk, dairy products, and foods that are high in calcium; beans and soybeans; whole grains.  Constipation can be a side effect of iron supplementation. Increased water and fiber intake are helpful. Water goal: > 2 liters/day. Fiber goal: > 25 grams/day.   Vitamin D deficiency. Low Vitamin D level contributes to fatigue and are associated with obesity, breast, and colon cancer. She will have routine check of VITAMIN D 25 Hydroxy (Vit-D Deficiency, Fractures) and follow-up in 2 weeks.  Obstructive sleep apnea syndrome. Intensive lifestyle modifications are the first line treatment for this issue. We discussed several lifestyle modifications today and she will continue to work on diet, exercise and weight loss efforts. We will continue to monitor. Orders and follow up as documented in patient  record. Ladaja was encouraged to keep trying to use her BiPAP as much as possible.  Counseling  Sleep apnea is a condition in which breathing pauses or becomes shallow during sleep. This happens over and over during the night. This disrupts your sleep and keeps your body from getting the rest that it needs, which can cause tiredness and lack of energy (fatigue) during the day.  Sleep apnea treatment: If you were given a device to open your airway while you sleep, USE IT!  Sleep hygiene:   Limit or avoid alcohol, caffeinated beverages, and cigarettes, especially close to bedtime.   Do  not eat a large meal or eat spicy foods right before bedtime. This can lead to digestive discomfort that can make it hard for you to sleep.  Keep a sleep diary to help you and your health care provider figure out what could be causing your insomnia.   Make your bedroom a dark, comfortable place where it is easy to fall asleep. ? Put up shades or blackout curtains to block light from outside. ? Use a white noise machine to block noise. ? Keep the temperature cool.  Limit screen use before bedtime. This includes: ? Watching TV. ? Using your smartphone, tablet, or computer.  Stick to a routine that includes going to bed and waking up at the same times every day and night. This can help you fall asleep faster. Consider making a quiet activity, such as reading, part of your nighttime routine.  Try to avoid taking naps during the day so that you sleep better at night.  Get out of bed if you are still awake after 15 minutes of trying to sleep. Keep the lights down, but try reading or doing a quiet activity. When you feel sleepy, go back to bed.  Hyperglycemia. Fasting labs will be obtained and results with be discussed with Sinia in 2 weeks at her follow up visit. In the meanwhile Nelissa was started on a lower simple carbohydrate diet and will work on weight loss efforts. Hemoglobin A1c ordered.  Depression with anxiety. Behavior modification techniques were discussed today to help Jerniyah deal with her emotional/non-hunger eating behaviors. Orders and follow up as documented in patient record. She will be referred to Dr. Mallie Mussel, our bariatric psychologist.  Class 3 severe obesity with serious comorbidity and body mass index (BMI) of 40.0 to 44.9 in adult, unspecified obesity type ([place]).  Letia is currently in the action stage of change and her goal is to continue with weight loss efforts. I recommend Roshon begin the structured treatment plan as follows:  She has agreed to the Category 3  Plan.  Behavioral modification strategies: increasing lean protein intake and no skipping meals.  She was informed of the importance of frequent follow-up visits to maximize her success with intensive lifestyle modifications for her multiple health conditions. She was informed we would discuss her lab results at her next visit unless there is a critical issue that needs to be addressed sooner. Atisha agreed to keep her next visit at the agreed upon time to discuss these results.  Objective:   Blood pressure 127/84, pulse 82, temperature 98.1 F (36.7 C), temperature source Oral, height 5\' 4"  (1.626 m), weight 243 lb (110.2 kg), SpO2 97 %. Body mass index is 41.71 kg/m.  EKG: Sinus  Rhythm with a rate of 69 BPM. Low voltage in precordial leads. Decreasing R-wave progression - may be secondary to pulmonary disease, consider old anterior infarct. Nonspecific T-abnormality. Abnormal.  Indirect Calorimeter completed today shows a VO2 of 275 and a REE of 1916.  Her calculated basal metabolic rate is [id number] thus her basal metabolic rate is better than expected.  General: Cooperative, alert, well developed, in no acute distress. HEENT: Conjunctivae and lids unremarkable. Cardiovascular: Regular rhythm.  Lungs: Normal work of breathing. Neurologic: No focal deficits.   Lab Results  Component Value Date   CREATININE 0.70 06/04/2017   BUN 17 06/04/2017   NA 137 06/04/2017   K 4.0 06/04/2017   CL 107 06/04/2017   CO2 23 06/04/2017   Lab Results  Component Value Date   ALT 31 03/25/2017   AST 24 03/25/2017   ALKPHOS 100 03/25/2017   Lab Results  Component Value Date   HGBA1C 5.6 06/05/2017   No results found for: INSULIN Lab Results  Component Value Date   TSH 0.73 03/30/2017   Lab Results  Component Value Date   CHOL 173 06/05/2017   HDL 33 (L) 06/05/2017   LDLCALC 86 06/05/2017   TRIG 272 (H) 06/05/2017   CHOLHDL 5.2 06/05/2017   Lab Results  Component Value Date   WBC 6.8  06/04/2017   HGB 12.1 06/04/2017   HCT 36.5 06/04/2017   MCV 82.4 06/04/2017   PLT 277 06/04/2017   Lab Results  Component Value Date   IRON 41 (L) 04/06/2017   TIBC 270 12/07/2014   FERRITIN 60.6 04/06/2017    Obesity Behavioral Intervention Visit Documentation for Insurance:   Approximately 15 minutes were spent on the discussion below.  ASK: We discussed the diagnosis of obesity with [name] today and [place] agreed to give [name] permission to discuss obesity behavioral modification therapy today.  ASSESS: Sullivan has the diagnosis of obesity and her BMI today is 41.7. Johnise is in the action stage of change.   ADVISE: Deshone was educated on the multiple health risks of obesity as well as the benefit of weight loss to improve her health. She was advised of the need for long term treatment and the importance of lifestyle modifications to improve her current health and to decrease her risk of future health problems.  AGREE: Multiple dietary modification options and treatment options were discussed and Tamla agreed to follow the recommendations documented in the above note.  ARRANGE: Nida was educated on the importance of frequent visits to treat obesity as outlined per CMS and USPSTF guidelines and agreed to schedule her next follow up appointment today.  Attestation Statements:   Reviewed by clinician on day of visit: allergies, medications, problem list, medical history, surgical history, family history, social history, and previous encounter notes.  I, Michaelene Song, am acting as Location manager for Dennard Nip, MD   I have reviewed the above documentation for accuracy and completeness, and I agree with the above. - Dennard Nip, MD

## 2020-01-02 ENCOUNTER — Telehealth (INDEPENDENT_AMBULATORY_CARE_PROVIDER_SITE_OTHER): Payer: Self-pay | Admitting: Psychology

## 2020-01-02 ENCOUNTER — Encounter (INDEPENDENT_AMBULATORY_CARE_PROVIDER_SITE_OTHER): Payer: Self-pay

## 2020-01-02 ENCOUNTER — Ambulatory Visit (INDEPENDENT_AMBULATORY_CARE_PROVIDER_SITE_OTHER): Payer: Medicare Other | Admitting: Psychology

## 2020-01-02 NOTE — Telephone Encounter (Signed)
  Office: [phone]  /  Fax: [phone]  Date of Call: January 02, 2020  Time of Call: 11:02am Provider: Glennie Isle, PsyD  CONTENT: This provider called Dawn Jordan to check-in as she did not present for today's Webex appointment at 11:00am. A HIPAA compliant voicemail was left requesting a call back. Of note, this provider stayed on the WebEx appointment for 5 minutes prior to signing off per the clinic's grace period policy.    PLAN: This provider will wait for [name] to call back. No further follow-up planned by this provider.

## 2020-01-03 ENCOUNTER — Other Ambulatory Visit: Payer: Self-pay | Admitting: Family Medicine

## 2020-01-09 ENCOUNTER — Encounter (INDEPENDENT_AMBULATORY_CARE_PROVIDER_SITE_OTHER): Payer: Self-pay | Admitting: Family Medicine

## 2020-01-09 ENCOUNTER — Ambulatory Visit (INDEPENDENT_AMBULATORY_CARE_PROVIDER_SITE_OTHER): Payer: Medicare Other | Admitting: Family Medicine

## 2020-01-09 ENCOUNTER — Other Ambulatory Visit: Payer: Self-pay

## 2020-01-09 VITALS — BP 126/85 | HR 72 | Temp 98.0°F | Ht 64.0 in | Wt 239.0 lb

## 2020-01-09 DIAGNOSIS — Z6841 Body Mass Index (BMI) 40.0 and over, adult: Secondary | ICD-10-CM | POA: Diagnosis not present

## 2020-01-09 DIAGNOSIS — E559 Vitamin D deficiency, unspecified: Secondary | ICD-10-CM

## 2020-01-09 DIAGNOSIS — R7303 Prediabetes: Secondary | ICD-10-CM

## 2020-01-09 DIAGNOSIS — E782 Mixed hyperlipidemia: Secondary | ICD-10-CM

## 2020-01-09 MED ORDER — VITAMIN D (ERGOCALCIFEROL) 1.25 MG (50000 UNIT) PO CAPS: 50000.0000 [IU] | ORAL_CAPSULE | ORAL | 0 refills | Status: DC

## 2020-01-09 MED ORDER — METFORMIN HCL 500 MG PO TABS: 500.0000 mg | ORAL_TABLET | Freq: Every day | ORAL | 0 refills | Status: DC

## 2020-01-10 NOTE — Progress Notes (Signed)
Chief Complaint:   OBESITY Dawn Jordan is here to discuss her progress with her obesity treatment plan along with follow-up of her obesity related diagnoses. Dawn Jordan is on the Category 3 Plan and states she is following her eating plan approximately 40% of the time. Dawn Jordan states she is doing 0 minutes 0 times per week.  Today's visit was #: 2 Starting weight: 243 lbs Starting date: 12/26/2019 Today's weight: 239 lbs Today's date: 01/09/2020 Total lbs lost to date: 4 Total lbs lost since last in-office visit: 4  Interim History: Dawn Jordan struggled to follow her plan at times. She tried to be mindful and portion control and make smarter choices when she could. She did well with weight loss overall even with extra challenges.  Subjective:   1. Pre-diabetes Dawn Jordan has a new diagnosis of pre-diabetes. Her A1c, glucose, and insulin are elevated. She notes polyphagia and controlled weight gain. I discussed labs with the patient today.  2. Vitamin D deficiency Shanece has a new diagnosis of Vit D deficiency. She is not on Vit D, her level is very low and she notes fatigue. I discussed labs with the patient today.  3. Mixed hyperlipidemia Dawn Jordan's HDL is low, and her triglycerides and LDL are elevated. She denies chest pain. She would like to try to improve with diet and weight loss.  Assessment/Plan:   1. Pre-diabetes Dawn Jordan will continue to work on weight loss, diet, exercise, and decreasing simple carbohydrates to help decrease the risk of diabetes. Dawn Jordan agreed to start metformin 500 mg daily with no refill. We will recheck labs in 3 months.  - metFORMIN (GLUCOPHAGE) 500 MG tablet; Take 1 tablet (500 mg total) by mouth daily with breakfast.  Dispense: 30 tablet; Refill: 0  2. Vitamin D deficiency Low Vitamin D level contributes to fatigue and are associated with obesity, breast, and colon cancer. Dawn Jordan agreed to start prescription Vitamin D 50,000 IU every week with no refill. She will follow-up for routine  testing of Vitamin D, at least 2-3 times per year to avoid over-replacement.  - Vitamin D, Ergocalciferol, (DRISDOL) 1.25 MG (50000 UNIT) CAPS capsule; Take 1 capsule (50,000 Units total) by mouth every 7 (seven) days.  Dispense: 4 capsule; Refill: 0  3. Mixed hyperlipidemia Cardiovascular risk and specific lipid/LDL goals reviewed. We discussed several lifestyle modifications today and Dawn Jordan will continue to work on diet, exercise, and weight loss efforts. We will recheck labs in 3 months. Orders and follow up as documented in patient record.   Counseling Intensive lifestyle modifications are the first line treatment for this issue. . Dietary changes: Increase soluble fiber. Decrease simple carbohydrates. . Exercise changes: Moderate to vigorous-intensity aerobic activity 150 minutes per week if tolerated. . Lipid-lowering medications: see documented in medical record.  4. Class 3 severe obesity with serious comorbidity and body mass index (BMI) of 40.0 to 44.9 in adult, unspecified obesity type (HCC) Dawn Jordan is currently in the action stage of change. As such, her goal is to continue with weight loss efforts. She has agreed to the Category 3 Plan.   Exercise goals: No exercise has been prescribed at this time.  Behavioral modification strategies: increasing lean protein intake, decreasing simple carbohydrates and meal planning and cooking strategies.  Dawn Jordan has agreed to follow-up with our clinic in 2 weeks. She was informed of the importance of frequent follow-up visits to maximize her success with intensive lifestyle modifications for her multiple health conditions.   Objective:   Blood pressure 126/85, pulse  72, temperature 98 F (36.7 C), temperature source Oral, height 5\' 4"  (1.626 m), weight 239 lb (108.4 kg), SpO2 100 %. Body mass index is 41.02 kg/m.  General: Cooperative, alert, well developed, in no acute distress. HEENT: Conjunctivae and lids unremarkable. Cardiovascular:  Regular rhythm.  Lungs: Normal work of breathing. Neurologic: No focal deficits.   Lab Results  Component Value Date   CREATININE 0.68 12/26/2019   BUN 13 12/26/2019   NA 143 12/26/2019   K 4.4 12/26/2019   CL 107 (H) 12/26/2019   CO2 24 12/26/2019   Lab Results  Component Value Date   ALT 33 (H) 12/26/2019   AST 28 12/26/2019   ALKPHOS 93 12/26/2019   BILITOT 0.3 12/26/2019   Lab Results  Component Value Date   HGBA1C 5.8 (H) 12/26/2019   HGBA1C 5.6 06/05/2017   Lab Results  Component Value Date   INSULIN 36.5 (H) 12/26/2019   Lab Results  Component Value Date   TSH 0.405 (L) 12/26/2019   Lab Results  Component Value Date   CHOL 202 (H) 12/26/2019   HDL 42 12/26/2019   LDLCALC 125 (H) 12/26/2019   TRIG 198 (H) 12/26/2019   CHOLHDL 5.2 06/05/2017   Lab Results  Component Value Date   WBC 4.7 12/26/2019   HGB 12.2 12/26/2019   HCT 37.0 12/26/2019   MCV 87 12/26/2019   PLT 280 12/26/2019   Lab Results  Component Value Date   IRON 41 (L) 04/06/2017   TIBC 270 12/07/2014   FERRITIN 60.6 04/06/2017    Obesity Behavioral Intervention Documentation for Insurance:   Approximately 15 minutes were spent on the discussion below.  ASK: We discussed the diagnosis of obesity with Dawn Jordan today and Dawn Jordan agreed to give Dawn Jordan permission to discuss obesity behavioral modification therapy today.  ASSESS: Dawn Jordan has the diagnosis of obesity and her BMI today is 41. Dawn Jordan is in the action stage of change.   ADVISE: [name] was educated on the multiple health risks of obesity as well as the benefit of weight loss to improve her health. She was advised of the need for long term treatment and the importance of lifestyle modifications to improve her current health and to decrease her risk of future health problems.  AGREE: Multiple dietary modification options and treatment options were discussed and Dawn Jordan agreed to follow the recommendations documented in the above  note.  ARRANGE: Dawn Jordan was educated on the importance of frequent visits to treat obesity as outlined per CMS and USPSTF guidelines and agreed to schedule her next follow up appointment today.  Attestation Statements:   Reviewed by clinician on day of visit: allergies, medications, problem list, medical history, surgical history, family history, social history, and previous encounter notes.   I, Trixie Dredge, am acting as transcriptionist for Dennard Nip, MD.  I have reviewed the above documentation for accuracy and completeness, and I agree with the above. -  Dennard Nip, MD

## 2020-01-23 ENCOUNTER — Encounter (INDEPENDENT_AMBULATORY_CARE_PROVIDER_SITE_OTHER): Payer: Self-pay | Admitting: Physician Assistant

## 2020-01-23 ENCOUNTER — Telehealth: Payer: Self-pay | Admitting: *Deleted

## 2020-01-23 ENCOUNTER — Other Ambulatory Visit: Payer: Self-pay

## 2020-01-23 ENCOUNTER — Ambulatory Visit (INDEPENDENT_AMBULATORY_CARE_PROVIDER_SITE_OTHER): Payer: Medicare Other | Admitting: Physician Assistant

## 2020-01-23 VITALS — BP 127/75 | HR 76 | Temp 98.3°F | Ht 64.0 in | Wt 236.0 lb

## 2020-01-23 DIAGNOSIS — Z6841 Body Mass Index (BMI) 40.0 and over, adult: Secondary | ICD-10-CM

## 2020-01-23 DIAGNOSIS — R7303 Prediabetes: Secondary | ICD-10-CM

## 2020-01-23 NOTE — Progress Notes (Signed)
Chief Complaint:   OBESITY Dawn Jordan is here to discuss her progress with her obesity treatment plan along with follow-up of her obesity related diagnoses. Earlie is on the Category 3 Plan and states she is following her eating plan approximately 99% of the time. Jerika states she is exercising 0 minutes 0 times per week.  Today's visit was #: 3 Starting weight: 243 lbs Starting date: 12/26/2019 Today's weight: 236 lbs Today's date: 01/23/2020 Total lbs lost to date: 7 Total lbs lost since last in-office visit: 3  Interim History: Dawn Jordan has done very well with the plan overall. She is not able to stand for long to cook due to knee pain and needing knee replacements.  Subjective:   Prediabetes. Dawn Jordan has a diagnosis of prediabetes based on her elevated HgA1c and was informed this puts her at greater risk of developing diabetes. She continues to work on diet and exercise to decrease her risk of diabetes. She denies nausea, vomiting, or diarrhea. No polyphagia. Dawn Jordan is on metformin.  Lab Results  Component Value Date   HGBA1C 5.8 (H) 12/26/2019   Lab Results  Component Value Date   INSULIN 36.5 (H) 12/26/2019   Assessment/Plan:   Prediabetes. Dawn Jordan will continue to work on weight loss, exercise, and decreasing simple carbohydrates to help decrease the risk of diabetes. She will continue metformin as directed.  Class 3 severe obesity with serious comorbidity and body mass index (BMI) of 40.0 to 44.9 in adult, unspecified obesity type ([place]).  Aldana is currently in the action stage of change. As such, her goal is to continue with weight loss efforts. She has agreed to the Category 3 Plan.   Exercise goals: For substantial health benefits, adults should do at least 150 minutes (2 hours and 30 minutes) a week of moderate-intensity, or 75 minutes (1 hour and 15 minutes) a week of vigorous-intensity aerobic physical activity, or an equivalent combination of moderate- and vigorous-intensity  aerobic activity. Aerobic activity should be performed in episodes of at least 10 minutes, and preferably, it should be spread throughout the week.  Behavioral modification strategies: meal planning and cooking strategies and keeping healthy foods in the home.  Dawn Jordan has agreed to follow-up with our clinic in 2 weeks. She was informed of the importance of frequent follow-up visits to maximize her success with intensive lifestyle modifications for her multiple health conditions.   Objective:   Blood pressure 127/75, pulse 76, temperature 98.3 F (36.8 C), temperature source Oral, height 5\' 4"  (1.626 m), weight 236 lb (107 kg), SpO2 96 %. Body mass index is 40.51 kg/m.  General: Cooperative, alert, well developed, in no acute distress. HEENT: Conjunctivae and lids unremarkable. Cardiovascular: Regular rhythm.  Lungs: Normal work of breathing. Neurologic: No focal deficits.   Lab Results  Component Value Date   CREATININE 0.68 12/26/2019   BUN 13 12/26/2019   NA 143 12/26/2019   K 4.4 12/26/2019   CL 107 (H) 12/26/2019   CO2 24 12/26/2019   Lab Results  Component Value Date   ALT 33 (H) 12/26/2019   AST 28 12/26/2019   ALKPHOS 93 12/26/2019   BILITOT 0.3 12/26/2019   Lab Results  Component Value Date   HGBA1C 5.8 (H) 12/26/2019   HGBA1C 5.6 06/05/2017   Lab Results  Component Value Date   INSULIN 36.5 (H) 12/26/2019   Lab Results  Component Value Date   TSH 0.405 (L) 12/26/2019   Lab Results  Component Value Date  CHOL 202 (H) 12/26/2019   HDL 42 12/26/2019   LDLCALC 125 (H) 12/26/2019   TRIG 198 (H) 12/26/2019   CHOLHDL 5.2 06/05/2017   Lab Results  Component Value Date   WBC 4.7 12/26/2019   HGB 12.2 12/26/2019   HCT 37.0 12/26/2019   MCV 87 12/26/2019   PLT 280 12/26/2019   Lab Results  Component Value Date   IRON 41 (L) 04/06/2017   TIBC 270 12/07/2014   FERRITIN 60.6 04/06/2017   Attestation Statements:   Reviewed by clinician on day of visit:  allergies, medications, problem list, medical history, surgical history, family history, social history, and previous encounter notes.  Time spent on visit including pre-visit chart review and post-visit care was 33 minutes.   IMichaelene Song, am acting as transcriptionist for Abby Potash, PA-C   I have reviewed the above documentation for accuracy and completeness, and I agree with the above. Abby Potash, PA-C
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
## 2020-01-23 NOTE — Telephone Encounter (Signed)
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
## 2020-01-24 ENCOUNTER — Telehealth (INDEPENDENT_AMBULATORY_CARE_PROVIDER_SITE_OTHER): Payer: Medicare Other | Admitting: Cardiology

## 2020-01-24 ENCOUNTER — Other Ambulatory Visit: Payer: Self-pay | Admitting: *Deleted

## 2020-01-24 ENCOUNTER — Encounter: Payer: Self-pay | Admitting: Cardiology

## 2020-01-24 VITALS — Ht 64.0 in | Wt 234.0 lb

## 2020-01-24 DIAGNOSIS — I10 Essential (primary) hypertension: Secondary | ICD-10-CM | POA: Diagnosis not present

## 2020-01-24 DIAGNOSIS — G4733 Obstructive sleep apnea (adult) (pediatric): Secondary | ICD-10-CM

## 2020-01-24 MED ORDER — NITROGLYCERIN 0.4 MG SL SUBL: 0.4000 mg | SUBLINGUAL_TABLET | SUBLINGUAL | 99 refills | Status: DC | PRN

## 2020-01-28 ENCOUNTER — Other Ambulatory Visit: Payer: Self-pay | Admitting: Family Medicine

## 2020-01-29 ENCOUNTER — Other Ambulatory Visit (INDEPENDENT_AMBULATORY_CARE_PROVIDER_SITE_OTHER): Payer: Self-pay | Admitting: Family Medicine

## 2020-01-29 ENCOUNTER — Telehealth: Payer: Self-pay | Admitting: *Deleted

## 2020-01-29 DIAGNOSIS — E559 Vitamin D deficiency, unspecified: Secondary | ICD-10-CM

## 2020-01-29 DIAGNOSIS — G4733 Obstructive sleep apnea (adult) (pediatric): Secondary | ICD-10-CM
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
## 2020-01-31 ENCOUNTER — Other Ambulatory Visit (INDEPENDENT_AMBULATORY_CARE_PROVIDER_SITE_OTHER): Payer: Self-pay | Admitting: Family Medicine

## 2020-01-31 DIAGNOSIS — R7303 Prediabetes: Secondary | ICD-10-CM

## 2020-02-01 ENCOUNTER — Ambulatory Visit (INDEPENDENT_AMBULATORY_CARE_PROVIDER_SITE_OTHER): Payer: Medicare Other | Admitting: Psychology

## 2020-02-01 ENCOUNTER — Other Ambulatory Visit: Payer: Self-pay

## 2020-02-01 DIAGNOSIS — F3289 Other specified depressive episodes: Secondary | ICD-10-CM

## 2020-02-01 DIAGNOSIS — F5089 Other specified eating disorder: Secondary | ICD-10-CM

## 2020-02-06 ENCOUNTER — Other Ambulatory Visit: Payer: Self-pay

## 2020-02-06 ENCOUNTER — Ambulatory Visit (INDEPENDENT_AMBULATORY_CARE_PROVIDER_SITE_OTHER): Payer: Medicare Other | Admitting: Physician Assistant

## 2020-02-06 ENCOUNTER — Encounter (INDEPENDENT_AMBULATORY_CARE_PROVIDER_SITE_OTHER): Payer: Self-pay | Admitting: Physician Assistant

## 2020-02-06 VITALS — BP 120/78 | HR 73 | Temp 98.2°F | Ht 64.0 in | Wt 240.0 lb

## 2020-02-06 DIAGNOSIS — R7303 Prediabetes: Secondary | ICD-10-CM

## 2020-02-06 DIAGNOSIS — Z6841 Body Mass Index (BMI) 40.0 and over, adult: Secondary | ICD-10-CM

## 2020-02-06 DIAGNOSIS — E66813 Obesity, class 3: Secondary | ICD-10-CM

## 2020-02-06 DIAGNOSIS — E559 Vitamin D deficiency, unspecified: Secondary | ICD-10-CM | POA: Diagnosis not present

## 2020-02-06 MED ORDER — METFORMIN HCL 500 MG PO TABS: 500.0000 mg | ORAL_TABLET | Freq: Every day | ORAL | 0 refills | Status: DC

## 2020-02-06 NOTE — Telephone Encounter (Signed)
Dawn Jordan, with [facility], is calling to follow up in regards to mask fitting. She states that in addition to the request for documents sent to the office on 02/01/20, [facility] is also requesting progress notes with pressure settings and a copy of the patient's sleep study.   Please return call to discuss at [phone].

## 2020-02-06 NOTE — Progress Notes (Signed)
Chief Complaint:   OBESITY Seymone is here to discuss her progress with her obesity treatment plan along with follow-up of her obesity related diagnoses. Pauli is on the Category 3 Plan and states she is following her eating plan approximately 60% of the time. Avigayil states she is exercising for 0 minutes 0 times per week.  Today's visit was #: 4 Starting weight: 243 lbs Starting date: 12/26/2019 Today's weight: 240 lbs Today's date: 02/06/2020 Total lbs lost to date: 3 lbs Total lbs lost since last in-office visit: 0  Interim History: Jerelyn reports that she has had a stressful last few weeks.  She ate out more than normal.  Subjective:   1. Prediabetes Krishana has a diagnosis of prediabetes based on her elevated HgA1c and was informed this puts her at greater risk of developing diabetes. She continues to work on diet and exercise to decrease her risk of diabetes. She denies nausea or hypoglycemia.  She is taking metformin 500 mg daily.  Lab Results  Component Value Date   HGBA1C 5.8 (H) 12/26/2019   Lab Results  Component Value Date   INSULIN 36.5 (H) 12/26/2019   2. Vitamin D deficiency Betzy's Vitamin D level was 12.1 on 12/26/2019. She is currently taking vit D. She denies nausea, vomiting or muscle weakness.  Assessment/Plan:   1. Prediabetes Waveline will continue to work on weight loss, exercise, and decreasing simple carbohydrates to help decrease the risk of diabetes.  - metFORMIN (GLUCOPHAGE) 500 MG tablet; Take 1 tablet (500 mg total) by mouth daily with breakfast.  Dispense: 30 tablet; Refill: 0  2. Vitamin D deficiency Low Vitamin D level contributes to fatigue and are associated with obesity, breast, and colon cancer. She agrees to continue to take prescription Vitamin D @50 ,000 IU every week and will follow-up for routine testing of Vitamin D, at least 2-3 times per year to avoid over-replacement.  3. Class 3 severe obesity with serious comorbidity and body mass index  (BMI) of 40.0 to 44.9 in adult, unspecified obesity type (HCC) Hira is currently in the action stage of change. As such, her goal is to continue with weight loss efforts. She has agreed to the Category 3 Plan.   Exercise goals: For substantial health benefits, adults should do at least 150 minutes (2 hours and 30 minutes) a week of moderate-intensity, or 75 minutes (1 hour and 15 minutes) a week of vigorous-intensity aerobic physical activity, or an equivalent combination of moderate- and vigorous-intensity aerobic activity. Aerobic activity should be performed in episodes of at least 10 minutes, and preferably, it should be spread throughout the week.  Behavioral modification strategies: meal planning and cooking strategies and keeping healthy foods in the home.  Vyvian has agreed to follow-up with our clinic in 2 weeks. She was informed of the importance of frequent follow-up visits to maximize her success with intensive lifestyle modifications for her multiple health conditions.   Objective:   Blood pressure 120/78, pulse 73, temperature 98.2 F (36.8 C), height 5\' 4"  (1.626 m), weight 240 lb (108.9 kg), SpO2 97 %. Body mass index is 41.2 kg/m.  General: Cooperative, alert, well developed, in no acute distress. HEENT: Conjunctivae and lids unremarkable. Cardiovascular: Regular rhythm.  Lungs: Normal work of breathing. Neurologic: No focal deficits.   Lab Results  Component Value Date   CREATININE 0.68 12/26/2019   BUN 13 12/26/2019   NA 143 12/26/2019   K 4.4 12/26/2019   CL 107 (H) 12/26/2019  CO2 24 12/26/2019   Lab Results  Component Value Date   ALT 33 (H) 12/26/2019   AST 28 12/26/2019   ALKPHOS 93 12/26/2019   BILITOT 0.3 12/26/2019   Lab Results  Component Value Date   HGBA1C 5.8 (H) 12/26/2019   HGBA1C 5.6 06/05/2017   Lab Results  Component Value Date   INSULIN 36.5 (H) 12/26/2019   Lab Results  Component Value Date   TSH 0.405 (L) 12/26/2019   Lab  Results  Component Value Date   CHOL 202 (H) 12/26/2019   HDL 42 12/26/2019   LDLCALC 125 (H) 12/26/2019   TRIG 198 (H) 12/26/2019   CHOLHDL 5.2 06/05/2017   Lab Results  Component Value Date   WBC 4.7 12/26/2019   HGB 12.2 12/26/2019   HCT 37.0 12/26/2019   MCV 87 12/26/2019   PLT 280 12/26/2019   Lab Results  Component Value Date   IRON 41 (L) 04/06/2017   TIBC 270 12/07/2014   FERRITIN 60.6 04/06/2017   Obesity Behavioral Intervention Documentation for Insurance:   Approximately 15 minutes were spent on the discussion below.  ASK: We discussed the diagnosis of obesity with [name] today and Alianah agreed to give [name] permission to discuss obesity behavioral modification therapy today.  ASSESS: Salimata has the diagnosis of obesity and her BMI today is 41.2. Devorah is in the action stage of change.   ADVISE: Jolei was educated on the multiple health risks of obesity as well as the benefit of weight loss to improve her health. She was advised of the need for long term treatment and the importance of lifestyle modifications to improve her current health and to decrease her risk of future health problems.  AGREE: Multiple dietary modification options and treatment options were discussed and Lilygrace agreed to follow the recommendations documented in the above note.  ARRANGE: Lossie was educated on the importance of frequent visits to treat obesity as outlined per CMS and USPSTF guidelines and agreed to schedule her next follow up appointment today.  Attestation Statements:   Reviewed by clinician on day of visit: allergies, medications, problem list, medical history, surgical history, family history, social history, and previous encounter notes.  I, Water quality scientist, CMA, am acting as Location manager for [employer], PA-C.  I have reviewed the above documentation for accuracy and completeness, and I agree with the above. Abby Potash, PA-C

## 2020-02-15 ENCOUNTER — Ambulatory Visit (INDEPENDENT_AMBULATORY_CARE_PROVIDER_SITE_OTHER): Payer: Self-pay | Admitting: Psychology

## 2020-02-15 ENCOUNTER — Ambulatory Visit (INDEPENDENT_AMBULATORY_CARE_PROVIDER_SITE_OTHER): Payer: Medicare Other | Admitting: Psychiatry

## 2020-02-15 ENCOUNTER — Other Ambulatory Visit: Payer: Self-pay

## 2020-02-15 DIAGNOSIS — F900 Attention-deficit hyperactivity disorder, predominantly inattentive type: Secondary | ICD-10-CM | POA: Diagnosis not present

## 2020-02-15 DIAGNOSIS — F4310 Post-traumatic stress disorder, unspecified: Secondary | ICD-10-CM

## 2020-02-15 DIAGNOSIS — F331 Major depressive disorder, recurrent, moderate: Secondary | ICD-10-CM | POA: Diagnosis not present

## 2020-02-15 MED ORDER — VIIBRYD 20 MG PO TABS: 20.0000 mg | ORAL_TABLET | Freq: Every day | ORAL | 0 refills | Status: DC

## 2020-02-15 MED ORDER — ATOMOXETINE HCL 40 MG PO CAPS: 40.0000 mg | ORAL_CAPSULE | Freq: Two times a day (BID) | ORAL | 0 refills | Status: DC

## 2020-02-15 MED ORDER — CLONAZEPAM 0.5 MG PO TABS: 0.5000 mg | ORAL_TABLET | Freq: Three times a day (TID) | ORAL | 2 refills | Status: DC | PRN

## 2020-02-15 NOTE — Progress Notes (Signed)
Virtual Visit via Telephone Note  I connected with Tarisa Radi on 02/15/20 at  3:00 PM EST by telephone and verified that I am speaking with the correct person using two identifiers.  Location: Patient: home Provider: office   I discussed the limitations, risks, security and privacy concerns of performing an evaluation and management service by telephone and the availability of in person appointments. I also discussed with the patient that there may be a patient responsible charge related to this service. The patient expressed understanding and agreed to proceed.   History of Present Illness: Shanee has been stressed. She is able to visit her mom in person now. Her mom needs a lot of help with cleaning. She is also visiting her godmother who is very sick. Kioni is working with the [employer] loss clinic and has lost 7 lbs. She is an Geographical information systems officer. Latonya does not want to identified as either female or female. She described several incidents where she would rage about her breasts and the clothes she had to wear. She cut herself on her chest in the past. Her depression is worse when she has nothing to do. Overall her depression is manageable. Yevonne has constant issues with self doubt. She denies SI/HI. She has been experiencing nightmares and wakes up crying and scared. Her HV remains high. Triggers like hearing MVA or watching the news cause extreme anxiety. She also hears the sound of cars crashing randomly. Rocio also reports that the Christianne Borrow is not working. She is unable to focus and shift from task to task.    Observations/Objective:  General Appearance: unable to assess  Eye Contact:  unable to assess  Speech:  Clear and Coherent and Normal Rate  Volume:  Normal  Mood:  Anxious and Depressed  Affect:  Blunt  Thought Process:  Coherent and Descriptions of Associations: Circumstantial  Orientation:  Full (Time, Place, and Person)  Thought Content:  Logical  Suicidal Thoughts:  No  Homicidal  Thoughts:  No  Memory:  Immediate;   Good  Judgement:  Fair  Insight:  Fair  Psychomotor Activity: unable to assess  Concentration:  Concentration: Good  Recall:  Good  Fund of Knowledge:  Good  Language:  Good  Akathisia:  unable to assess  Handed:  Right  AIMS (if indicated):     Assets:  Communication Skills Desire for Improvement Financial Resources/Insurance Housing Talents/Skills Transportation  ADL's:  unable to assess  Cognition:  WNL  Sleep:         I reviewed the information below on 02/15/20 and I have updated it Assessment and Plan:   PTSD; MDD-recurrent, moderate; ADHD-inattentive type   Status of current symptoms: ongoing   Klonopin 0.5mg  po TID prn anxiety D/c Lithobid  Viibryd 20mg  po qD Increase Strattera 40mg  po BID    Follow Up Instructions: In 2-3 months or sooner if needed   I discussed the assessment and treatment plan with the patient. The patient was provided an opportunity to ask questions and all were answered. The patient agreed with the plan and demonstrated an understanding of the instructions.   The patient was advised to call back or seek an in-person evaluation if the symptoms worsen or if the condition fails to improve as anticipated.  I provided 30 minutes of non-face-to-face time during this encounter.   Charlcie Cradle, MD

## 2020-02-21 ENCOUNTER — Ambulatory Visit (INDEPENDENT_AMBULATORY_CARE_PROVIDER_SITE_OTHER): Payer: Medicare Other | Admitting: Physician Assistant

## 2020-02-23 ENCOUNTER — Other Ambulatory Visit: Payer: Self-pay | Admitting: Family Medicine

## 2020-02-23 DIAGNOSIS — K219 Gastro-esophageal reflux disease without esophagitis: Secondary | ICD-10-CM

## 2020-02-26 ENCOUNTER — Ambulatory Visit (INDEPENDENT_AMBULATORY_CARE_PROVIDER_SITE_OTHER): Payer: Medicare Other | Admitting: Family Medicine

## 2020-02-26 ENCOUNTER — Other Ambulatory Visit: Payer: Self-pay

## 2020-02-26 ENCOUNTER — Encounter (INDEPENDENT_AMBULATORY_CARE_PROVIDER_SITE_OTHER): Payer: Self-pay | Admitting: Family Medicine

## 2020-02-26 VITALS — BP 139/75 | HR 73 | Temp 97.5°F | Ht 64.0 in | Wt 236.0 lb

## 2020-02-26 DIAGNOSIS — E559 Vitamin D deficiency, unspecified: Secondary | ICD-10-CM | POA: Diagnosis not present

## 2020-02-26 DIAGNOSIS — Z6841 Body Mass Index (BMI) 40.0 and over, adult: Secondary | ICD-10-CM | POA: Diagnosis not present

## 2020-02-26 DIAGNOSIS — R7303 Prediabetes: Secondary | ICD-10-CM | POA: Diagnosis not present

## 2020-02-26 NOTE — Progress Notes (Signed)
Chief Complaint:   OBESITY Dawn Jordan is here to discuss her progress with her obesity treatment plan along with follow-up of her obesity related diagnoses. Dawn Jordan is on the Category 3 Plan and states she is following her eating plan approximately 0% of the time. Dawn Jordan states she is doing 0 minutes 0 times per week.  Today's visit was #: 5 Starting weight: 243 lbs Starting date: 12/26/2019 Today's weight: 236 lbs Today's date: 02/26/2020 Total lbs lost to date: 7 Total lbs lost since last in-office visit: 4  Interim History: Dawn Jordan is having a problem with the amount of diary in the meal plan. It causes her to have to run to the bathroom. She voices most food tends to go through her. Her workup with GI was negative. She voices she doesn't know what will be best in terms of the plan for weight loss. She voices that she fluctuates in 3-4 lbs.  Subjective:   1. Pre-diabetes Dawn Jordan's last Hgb A1c was 5.8 and insulin of 36.5. she is on metformin daily, and she has a history of GI side effects.  2. Vitamin D deficiency Dawn Jordan denies nausea, vomiting, or muscle weakness, but she notes fatigue.  Assessment/Plan:   1. Pre-diabetes Dawn Jordan will continue to work on weight loss, exercise, and decreasing simple carbohydrates to help decrease the risk of diabetes. Dawn Jordan agreed to continue metformin as tolerated.  2. Vitamin D deficiency Low Vitamin D level contributes to fatigue and are associated with obesity, breast, and colon cancer. Dawn Jordan agreed to continue taking prescription Vitamin D, no refill needed. She will follow-up for routine testing of Vitamin D, at least 2-3 times per year to avoid over-replacement.  3. Class 3 severe obesity with serious comorbidity and body mass index (BMI) of 40.0 to 44.9 in adult, unspecified obesity type (HCC) Dawn Jordan is currently in the action stage of change. As such, her goal is to continue with weight loss efforts. She has agreed to following a lower carbohydrate, vegetable  and lean protein rich diet plan.   Exercise goals: No exercise has been prescribed at this time.  Behavioral modification strategies: increasing lean protein intake, increasing vegetables, meal planning and cooking strategies, keeping healthy foods in the home and planning for success.  Dawn Jordan has agreed to follow-up with our clinic in 2 weeks. She was informed of the importance of frequent follow-up visits to maximize her success with intensive lifestyle modifications for her multiple health conditions.   Objective:   Blood pressure 139/75, pulse 73, temperature (!) 97.5 F (36.4 C), temperature source Oral, height 5\' 4"  (1.626 m), weight 236 lb (107 kg), SpO2 99 %. Body mass index is 40.51 kg/m.  General: Cooperative, alert, well developed, in no acute distress. HEENT: Conjunctivae and lids unremarkable. Cardiovascular: Regular rhythm.  Lungs: Normal work of breathing. Neurologic: No focal deficits.   Lab Results  Component Value Date   CREATININE 0.68 12/26/2019   BUN 13 12/26/2019   NA 143 12/26/2019   K 4.4 12/26/2019   CL 107 (H) 12/26/2019   CO2 24 12/26/2019   Lab Results  Component Value Date   ALT 33 (H) 12/26/2019   AST 28 12/26/2019   ALKPHOS 93 12/26/2019   BILITOT 0.3 12/26/2019   Lab Results  Component Value Date   HGBA1C 5.8 (H) 12/26/2019   HGBA1C 5.6 06/05/2017   Lab Results  Component Value Date   INSULIN 36.5 (H) 12/26/2019   Lab Results  Component Value Date   TSH 0.405 (  L) 12/26/2019   Lab Results  Component Value Date   CHOL 202 (H) 12/26/2019   HDL 42 12/26/2019   LDLCALC 125 (H) 12/26/2019   TRIG 198 (H) 12/26/2019   CHOLHDL 5.2 06/05/2017   Lab Results  Component Value Date   WBC 4.7 12/26/2019   HGB 12.2 12/26/2019   HCT 37.0 12/26/2019   MCV 87 12/26/2019   PLT 280 12/26/2019   Lab Results  Component Value Date   IRON 41 (L) 04/06/2017   TIBC 270 12/07/2014   FERRITIN 60.6 04/06/2017   Attestation Statements:    Reviewed by clinician on day of visit: allergies, medications, problem list, medical history, surgical history, family history, social history, and previous encounter notes.  Time spent on visit including pre-visit chart review and post-visit care and charting was 15 minutes.    I, Trixie Dredge, am acting as transcriptionist for Elberta Fortis, MD.  I have reviewed the above documentation for accuracy and completeness, and I agree with the above. - Dawn Qua, MD

## 2020-02-28 ENCOUNTER — Other Ambulatory Visit: Payer: Self-pay | Admitting: Family Medicine

## 2020-02-28 ENCOUNTER — Other Ambulatory Visit (INDEPENDENT_AMBULATORY_CARE_PROVIDER_SITE_OTHER): Payer: Self-pay | Admitting: Physician Assistant

## 2020-02-28 DIAGNOSIS — R7303 Prediabetes: Secondary | ICD-10-CM

## 2020-03-04 DIAGNOSIS — M25561 Pain in right knee: Secondary | ICD-10-CM | POA: Diagnosis not present

## 2020-03-04 DIAGNOSIS — M1712 Unilateral primary osteoarthritis, left knee: Secondary | ICD-10-CM | POA: Diagnosis not present

## 2020-03-04 DIAGNOSIS — M170 Bilateral primary osteoarthritis of knee: Secondary | ICD-10-CM | POA: Diagnosis not present

## 2020-03-04 DIAGNOSIS — M1711 Unilateral primary osteoarthritis, right knee: Secondary | ICD-10-CM | POA: Diagnosis not present

## 2020-03-04 DIAGNOSIS — M25562 Pain in left knee: Secondary | ICD-10-CM | POA: Diagnosis not present

## 2020-03-11 ENCOUNTER — Telehealth (INDEPENDENT_AMBULATORY_CARE_PROVIDER_SITE_OTHER): Payer: Self-pay | Admitting: Psychology

## 2020-03-11 NOTE — Telephone Encounter (Signed)
  Office: [phone]  /  Fax: [phone]  Date of Call: March 11, 2020  Time of Call: 9:16am Provider: Glennie Isle, PsyD  CONTENT: This provider called Dawn Jordan to check-in and schedule a follow-up appointment. A HIPAA compliant voicemail was left requesting a call back.    PLAN: This provider will wait for [name] to call back. No further follow-up planned by this provider.

## 2020-03-12 ENCOUNTER — Other Ambulatory Visit: Payer: Self-pay

## 2020-03-12 ENCOUNTER — Encounter (INDEPENDENT_AMBULATORY_CARE_PROVIDER_SITE_OTHER): Payer: Self-pay | Admitting: Physician Assistant

## 2020-03-12 ENCOUNTER — Ambulatory Visit (INDEPENDENT_AMBULATORY_CARE_PROVIDER_SITE_OTHER): Payer: Medicare Other | Admitting: Physician Assistant

## 2020-03-12 VITALS — BP 125/78 | HR 60 | Temp 97.6°F | Ht 64.0 in | Wt 234.0 lb

## 2020-03-12 DIAGNOSIS — I10 Essential (primary) hypertension: Secondary | ICD-10-CM

## 2020-03-12 DIAGNOSIS — R7303 Prediabetes: Secondary | ICD-10-CM | POA: Diagnosis not present

## 2020-03-12 DIAGNOSIS — Z6841 Body Mass Index (BMI) 40.0 and over, adult: Secondary | ICD-10-CM | POA: Diagnosis not present

## 2020-03-12 MED ORDER — METOPROLOL TARTRATE 25 MG PO TABS: 25.0000 mg | ORAL_TABLET | Freq: Every day | ORAL | 0 refills | Status: DC

## 2020-03-12 NOTE — Progress Notes (Signed)
Chief Complaint:   OBESITY Dawn Jordan is here to discuss her progress with her obesity treatment plan along with follow-up of her obesity related diagnoses. Dawn Jordan is on following a lower carbohydrate, vegetable and lean protein rich diet plan and states she is following her eating plan approximately 75% of the time. Dawn Jordan states she is exercising for 0 minutes 0 times per week.  Today's visit was #: 6 Starting weight: 243 lbs Starting date: 12/26/2019 Today's weight: 234 lbs Today's date: 03/12/2020 Total lbs lost to date: 9 lbs Total lbs lost since last in-office visit: 2 lbs  Interim History: Dawn Jordan reports that her GI symptoms did not resolve with change to the low carb plan, although she notes that she has had these symptoms for a long time.  She states GI told her they did not find anything wrong.  Subjective:   1. Essential hypertension Review: taking medications as instructed, no medication side effects noted, no chest pain on exertion, no dyspnea on exertion, no swelling of ankles or headaches.  Blood pressure is normal.   BP Readings from Last 3 Encounters:  03/12/20 125/78  02/26/20 139/75  02/06/20 120/78   2. Prediabetes Dawn Jordan has a diagnosis of prediabetes based on her elevated HgA1c and was informed this puts her at greater risk of developing diabetes. She continues to work on diet and exercise to decrease her risk of diabetes. She denies hypoglycemia.  She is taking metformin.  No nausea, vomiting, diarrhea, or hypoglycemia.  Lab Results  Component Value Date   HGBA1C 5.8 (H) 12/26/2019   Lab Results  Component Value Date   INSULIN 36.5 (H) 12/26/2019   Assessment/Plan:   1. Essential hypertension Dawn Jordan is working on healthy weight loss and exercise to improve blood pressure control. We will watch for signs of hypotension as she continues her lifestyle modifications. - metoprolol tartrate (LOPRESSOR) 25 MG tablet; Take 1 tablet (25 mg total) by mouth daily.  Dispense:  30 tablet; Refill: 0  2. Prediabetes Dawn Jordan will continue to work on weight loss, exercise, and decreasing simple carbohydrates to help decrease the risk of diabetes.   3. Class 3 severe obesity with serious comorbidity and body mass index (BMI) of 40.0 to 44.9 in adult, unspecified obesity type (HCC) Dawn Jordan is currently in the action stage of change. As such, her goal is to continue with weight loss efforts. She has agreed to the Category 3 Plan modified.   Exercise goals: All adults should avoid inactivity. Some physical activity is better than none, and adults who participate in any amount of physical activity gain some health benefits.  Behavioral modification strategies: meal planning and cooking strategies and keeping healthy foods in the home.  Dawn Jordan has agreed to follow-up with our clinic in 2 weeks. She was informed of the importance of frequent follow-up visits to maximize her success with intensive lifestyle modifications for her multiple health conditions.   Objective:   Blood pressure 125/78, pulse 60, temperature 97.6 F (36.4 C), temperature source Oral, height 5\' 4"  (1.626 m), weight 234 lb (106.1 kg), SpO2 98 %. Body mass index is 40.17 kg/m.  General: Cooperative, alert, well developed, in no acute distress. HEENT: Conjunctivae and lids unremarkable. Cardiovascular: Regular rhythm.  Lungs: Normal work of breathing. Neurologic: No focal deficits.   Lab Results  Component Value Date   CREATININE 0.68 12/26/2019   BUN 13 12/26/2019   NA 143 12/26/2019   K 4.4 12/26/2019   CL 107 (H) 12/26/2019  CO2 24 12/26/2019   Lab Results  Component Value Date   ALT 33 (H) 12/26/2019   AST 28 12/26/2019   ALKPHOS 93 12/26/2019   BILITOT 0.3 12/26/2019   Lab Results  Component Value Date   HGBA1C 5.8 (H) 12/26/2019   HGBA1C 5.6 06/05/2017   Lab Results  Component Value Date   INSULIN 36.5 (H) 12/26/2019   Lab Results  Component Value Date   TSH 0.405 (L) 12/26/2019     Lab Results  Component Value Date   CHOL 202 (H) 12/26/2019   HDL 42 12/26/2019   LDLCALC 125 (H) 12/26/2019   TRIG 198 (H) 12/26/2019   CHOLHDL 5.2 06/05/2017   Lab Results  Component Value Date   WBC 4.7 12/26/2019   HGB 12.2 12/26/2019   HCT 37.0 12/26/2019   MCV 87 12/26/2019   PLT 280 12/26/2019   Lab Results  Component Value Date   IRON 41 (L) 04/06/2017   TIBC 270 12/07/2014   FERRITIN 60.6 04/06/2017   Obesity Behavioral Intervention Documentation for Insurance:   Approximately 15 minutes were spent on the discussion below.  ASK: We discussed the diagnosis of obesity with Dawn Jordan today and Dawn Jordan agreed to give [name] permission to discuss obesity behavioral modification therapy today.  ASSESS: Dawn Jordan has the diagnosis of obesity and her BMI today is 40.3. Dawn Jordan is in the action stage of change.   ADVISE: Dawn Jordan was educated on the multiple health risks of obesity as well as the benefit of weight loss to improve her health. She was advised of the need for long term treatment and the importance of lifestyle modifications to improve her current health and to decrease her risk of future health problems.  AGREE: Multiple dietary modification options and treatment options were discussed and Dawn Jordan agreed to follow the recommendations documented in the above note.  ARRANGE: Dawn Jordan was educated on the importance of frequent visits to treat obesity as outlined per CMS and USPSTF guidelines and agreed to schedule her next follow up appointment today.  Attestation Statements:   Reviewed by clinician on day of visit: allergies, medications, problem list, medical history, surgical history, family history, social history, and previous encounter notes.  I, Water quality scientist, CMA, am acting as Location manager for [employer], PA-C.  I have reviewed the above documentation for accuracy and completeness, and I agree with the above. Abby Potash, PA-C

## 2020-03-13 ENCOUNTER — Other Ambulatory Visit: Payer: Self-pay | Admitting: Family Medicine

## 2020-03-13 ENCOUNTER — Ambulatory Visit (INDEPENDENT_AMBULATORY_CARE_PROVIDER_SITE_OTHER): Payer: Medicare Other | Admitting: Psychology

## 2020-03-13 ENCOUNTER — Other Ambulatory Visit: Payer: Self-pay

## 2020-03-13 DIAGNOSIS — F4323 Adjustment disorder with mixed anxiety and depressed mood: Secondary | ICD-10-CM

## 2020-03-13 MED ORDER — MONTELUKAST SODIUM 10 MG PO TABS: ORAL_TABLET | ORAL | 0 refills | Status: DC

## 2020-03-13 NOTE — Telephone Encounter (Signed)
Refill sent.

## 2020-03-13 NOTE — Telephone Encounter (Signed)
Patient called and made a yearly appointment with [facility] for 4/23. She is requesting refills for Xyzal and Singulair. She said she called the pharmacy and they were to send over the request. [facility] [address].

## 2020-03-15 NOTE — Telephone Encounter (Signed)
Reached out to [facility] spoke to [place] who gave me everything needed to help the patient. All notes and sleep studies faxed to [facility] @ [phone]. [facility] will call back with confirmation of receipt.Marland Kitchen

## 2020-03-18 ENCOUNTER — Ambulatory Visit (INDEPENDENT_AMBULATORY_CARE_PROVIDER_SITE_OTHER): Payer: Medicare Other | Admitting: Psychology

## 2020-03-18 DIAGNOSIS — F331 Major depressive disorder, recurrent, moderate: Secondary | ICD-10-CM

## 2020-03-19 NOTE — Telephone Encounter (Signed)
Mask fit done 02/07/20 at 1:00 pm. Patient has been re-qualified per Meryl Crutch.

## 2020-03-19 NOTE — Telephone Encounter (Signed)
Please set her up an appt with [facility] to get a mask fitting with a mask that the straps dont come down over her neck area where her damaged nerves are.

## 2020-03-26 ENCOUNTER — Ambulatory Visit (INDEPENDENT_AMBULATORY_CARE_PROVIDER_SITE_OTHER): Payer: Medicare Other | Admitting: Physician Assistant

## 2020-03-29 ENCOUNTER — Other Ambulatory Visit: Payer: Self-pay

## 2020-03-29 ENCOUNTER — Encounter: Payer: Self-pay | Admitting: Family Medicine

## 2020-03-29 ENCOUNTER — Other Ambulatory Visit: Payer: Self-pay | Admitting: Family Medicine

## 2020-03-29 ENCOUNTER — Ambulatory Visit (INDEPENDENT_AMBULATORY_CARE_PROVIDER_SITE_OTHER): Payer: Medicare Other | Admitting: Family Medicine

## 2020-03-29 VITALS — BP 124/78 | HR 66 | Temp 97.6°F | Resp 18 | Ht 64.0 in | Wt 247.6 lb

## 2020-03-29 DIAGNOSIS — K219 Gastro-esophageal reflux disease without esophagitis: Secondary | ICD-10-CM | POA: Diagnosis not present

## 2020-03-29 DIAGNOSIS — J4541 Moderate persistent asthma with (acute) exacerbation: Secondary | ICD-10-CM | POA: Diagnosis not present

## 2020-03-29 DIAGNOSIS — J302 Other seasonal allergic rhinitis: Secondary | ICD-10-CM | POA: Diagnosis not present

## 2020-03-29 DIAGNOSIS — M797 Fibromyalgia: Secondary | ICD-10-CM

## 2020-03-29 DIAGNOSIS — H1010 Acute atopic conjunctivitis, unspecified eye: Secondary | ICD-10-CM | POA: Diagnosis not present

## 2020-03-29 DIAGNOSIS — J3089 Other allergic rhinitis: Secondary | ICD-10-CM | POA: Diagnosis not present

## 2020-03-29 DIAGNOSIS — Z91038 Other insect allergy status: Secondary | ICD-10-CM

## 2020-03-29 DIAGNOSIS — M1711 Unilateral primary osteoarthritis, right knee: Secondary | ICD-10-CM | POA: Diagnosis not present

## 2020-03-29 DIAGNOSIS — M170 Bilateral primary osteoarthritis of knee: Secondary | ICD-10-CM | POA: Diagnosis not present

## 2020-03-29 DIAGNOSIS — M1712 Unilateral primary osteoarthritis, left knee: Secondary | ICD-10-CM | POA: Diagnosis not present

## 2020-03-29 MED ORDER — OLOPATADINE HCL 0.2 % OP SOLN: 1.0000 [drp] | Freq: Every day | OPHTHALMIC | 5 refills | Status: DC | PRN

## 2020-03-29 MED ORDER — EPINEPHRINE 0.3 MG/0.3ML IJ SOAJ: INTRAMUSCULAR | 1 refills | Status: DC

## 2020-03-29 MED ORDER — MONTELUKAST SODIUM 10 MG PO TABS: ORAL_TABLET | ORAL | 5 refills | Status: DC

## 2020-03-29 MED ORDER — TRIAMCINOLONE ACETONIDE 0.1 % EX OINT: 1.0000 "application " | TOPICAL_OINTMENT | Freq: Two times a day (BID) | CUTANEOUS | 5 refills | Status: AC

## 2020-03-29 MED ORDER — FLOVENT HFA 220 MCG/ACT IN AERO
2.0000 | INHALATION_SPRAY | Freq: Two times a day (BID) | RESPIRATORY_TRACT | 5 refills | Status: DC
Start: 2020-03-29 — End: 2020-10-21

## 2020-03-29 MED ORDER — FLUTICASONE PROPIONATE 50 MCG/ACT NA SUSP
1.0000 | Freq: Every day | NASAL | 5 refills | Status: DC
Start: 2020-03-29 — End: 2021-02-12

## 2020-03-29 MED ORDER — LEVALBUTEROL TARTRATE 45 MCG/ACT IN AERO
2.0000 | INHALATION_SPRAY | Freq: Four times a day (QID) | RESPIRATORY_TRACT | 5 refills | Status: DC | PRN
Start: 2020-03-29 — End: 2021-02-12

## 2020-03-29 MED ORDER — LEVOCETIRIZINE DIHYDROCHLORIDE 5 MG PO TABS: 5.0000 mg | ORAL_TABLET | Freq: Every evening | ORAL | 5 refills | Status: DC

## 2020-03-29 NOTE — Progress Notes (Signed)
[place] [address] Dept: [phone]  FOLLOW UP NOTE  Patient ID: Dawn Jordan, female    DOB: 26-Mar-1966  Age: 54 y.o. MRN: [id number] Date of Office Visit: 03/29/2020  Assessment  Chief Complaint: Asthma  HPI Dawn Jordan is a 54 year old female who presents to the clinic for follow-up visit.  She was last seen in this clinic 10/21/2018 for evaluation of asthma, allergic rhinitis, allergic conjunctivitis, atopic dermatitis, insect sting allergy, and allergy to metal.  In the interim, she has been diagnosed with A. fib and is followed by cardiology.  At today's visit she reports her asthma has not been well controlled with shortness of breath which has been occurring for several months and is worse with activity and occasionally occurs with rest.  She reports intermittent wheezing and denies coughing.  She ran out of her prescribed medications over a year ago and has been using a family member's asthma medications.  Allergic rhinitis is reported as not well controlled with nasal congestion and clear rhinorrhea.  She is occasionally using Xyzal, Flonase, and saline nasal spray.  Allergic conjunctivitis is reported as well controlled with Pataday as needed.  Atopic dermatitis is reported as moderately well controlled with Lubriderm and Benadryl cream.  She reports itchy areas after having several ant bites.  She continues to avoid stinging insects with no accidental stings or EpiPen use.  Reflux is reported as well controlled with omeprazole 40 mg once a day.  Her current medications are listed in the chart   Drug Allergies:  Allergies  Allergen Reactions  . Eggs Or Egg-Derived Products Diarrhea  . Latex Anaphylaxis, Hives and Itching    "Anaphylaxis is only when I am in a closed area (ex: car with balloons)"  . Other Other (See Comments)    Sinus headache from new plastics, carpets, etc.  . Codeine     Headaches     Physical Exam: BP 124/78   Pulse 66   Temp  97.6 F (36.4 C) (Temporal)   Resp 18   Ht 5\' 4"  (1.626 m)   Wt 247 lb 9.6 oz (112.3 kg)   LMP  (LMP Unknown)   SpO2 98%   BMI 42.50 kg/m    Physical Exam Vitals reviewed.  Constitutional:      Appearance: Normal appearance.  HENT:     Head: Normocephalic and atraumatic.     Right Ear: Tympanic membrane normal.     Left Ear: Tympanic membrane normal.     Nose:     Comments: Bilateral nares edematous and pale with clear nasal drainage.  Pharynx is slightly erythematous with no exudate noted.  Ears normal.  Eyes normal. Eyes:     Conjunctiva/sclera: Conjunctivae normal.  Cardiovascular:     Rate and Rhythm: Normal rate and regular rhythm.     Heart sounds: Normal heart sounds. No murmur.  Pulmonary:     Effort: Pulmonary effort is normal.     Breath sounds: Normal breath sounds.     Comments: Lungs clear to auscultation Musculoskeletal:        General: Normal range of motion.     Cervical back: Normal range of motion and neck supple.  Skin:    General: Skin is warm and dry.     Comments: No rash noted on exam  Neurological:     Mental Status: She is alert and oriented to person, place, and time.  Psychiatric:        Mood and Affect:  Mood normal.        Behavior: Behavior normal.        Thought Content: Thought content normal.        Judgment: Judgment normal.     Diagnostics: FVC 2.75, FEV1 2.22.  Predicted FVC 3.48, predicted FEV1 2.73.  Spirometry indicates mild restriction.  Postbronchodilator spirometry FVC 2.93, FEV1 2.33.  Postbronchodilator spirometry indicates normal ventilatory function with no significant bronchodilator response.  Assessment and Plan: 1. Moderate persistent asthma with acute exacerbation   2. Seasonal and perennial allergic rhinitis   3. History of insect sting allergy   4. Seasonal allergic conjunctivitis   5. Gastroesophageal reflux disease, unspecified whether esophagitis present     Meds ordered this encounter  Medications  .  EPINEPHrine (EPIPEN 2-PAK) 0.3 mg/0.3 mL IJ SOAJ injection    Sig: Use for life threatening allergic reactions    Dispense:  2 each    Refill:  1    Dispense generic [employer] Brand  . montelukast (SINGULAIR) 10 MG tablet    Sig: TAKE 1 TABLET BY MOUTH EVERYDAY AT BEDTIME    Dispense:  30 tablet    Refill:  5    Please keep appt for additional refills.  . Olopatadine HCl 0.2 % SOLN    Sig: Place 1 drop into both eyes daily as needed.    Dispense:  2.5 mL    Refill:  5  . levocetirizine (XYZAL) 5 MG tablet    Sig: Take 1 tablet (5 mg total) by mouth every evening.    Dispense:  30 tablet    Refill:  5  . fluticasone (FLOVENT HFA) 220 MCG/ACT inhaler    Sig: Inhale 2 puffs into the lungs in the morning and at bedtime.    Dispense:  12 g    Refill:  5  . fluticasone (FLONASE) 50 MCG/ACT nasal spray    Sig: Place 1 spray into both nostrils daily.    Dispense:  16 g    Refill:  5  . triamcinolone ointment (KENALOG) 0.1 %    Sig: Apply 1 application topically 2 (two) times daily.    Dispense:  60 g    Refill:  5  . levalbuterol (XOPENEX HFA) 45 MCG/ACT inhaler    Sig: Inhale 2 puffs into the lungs every 6 (six) hours as needed for wheezing.    Dispense:  1 Inhaler    Refill:  5    Patient Instructions  Moderate persistent asthma    Stop Symbicort Begin Flovent 220-2 puffs twice a day with a spacer to prevent cough or wheeze Restart montelukast 10 mg once a day to prevent cough or wheeze Stop albuteral and begin Xopenex 2 puffs every 4 hours as needed for cough or wheeze  Seasonal and perennial allergic rhinitis (indoor molds, outdoor molds, cockroach, dust mites) Continue allergen avoidance measures as listed below Begin Xyzal 5 mg once a day as needed for a runny nose Begin Flonase 1 spray in each nostril once a day as needed for a stuffy nose.  In the right nostril, point the applicator out toward the right ear. In the left nostril, point the applicator out toward the left  ear Consider saline nasal rinses as needed for nasal symptoms. Use this before any medicated nasal sprays for best result  Allergic conjunctivitis Continue Pataday one drop per eye once daily as needed.   History of allergy to stinging insect and fire ant bite Continue to avoid stinging insects and fire  ants. In case of an allergic reaction, take Benadryl 50 mg every 4 hours, and if life-threatening symptoms occur, inject with EpiPen 0.3 mg.  Allergy to metal Continue to avoid triggering metals (nickel, chromium, copper sulfate)  Atopic dermatitis Continue a daily moisturizer For red itchy areas below your face, apply triamcinolone 0.1% ointment twice a day as needed  Follow up in 2 months or sooner if needed  Call the clinic if this treatment plan is not working well for you or if your symptoms worsen, do not improve, or you develop a fever   Return in about 2 months (around 05/29/2020), or if symptoms worsen or fail to improve.    Thank you for the opportunity to care for this patient.  Please do not hesitate to contact me with questions.  Gareth Morgan, FNP [facility]

## 2020-03-29 NOTE — Progress Notes (Deleted)
I am ready whenever you are  [address] [place] [address] Dept: [phone]  FOLLOW UP NOTE  Patient ID: Dawn Jordan, female    DOB: [birth year]  Age: 54 y.o. MRN: [id number] Date of Office Visit: 03/29/2020  Assessment  Chief Complaint: Asthma  HPI Levone Desorbo    Drug Allergies:  Allergies  Allergen Reactions  . Eggs Or Egg-Derived Products Diarrhea  . Latex Anaphylaxis, Hives and Itching    "Anaphylaxis is only when I am in a closed area (ex: car with balloons)"  . Other Other (See Comments)    Sinus headache from new plastics, carpets, etc.  . Codeine     Headaches     Physical Exam: BP 124/78   Pulse 66   Temp 97.6 F (36.4 C) (Temporal)   Resp 18   Ht 5\' 4"  (1.626 m)   Wt 247 lb 9.6 oz (112.3 kg)   LMP  (LMP Unknown)   SpO2 98%   BMI 42.50 kg/m    Physical Exam  Diagnostics:    Assessment and Plan: 1. Moderate persistent asthma with acute exacerbation     Meds ordered this encounter  Medications  . EPINEPHrine (EPIPEN 2-PAK) 0.3 mg/0.3 mL IJ SOAJ injection    Sig: Use for life threatening allergic reactions    Dispense:  2 each    Refill:  1    Dispense generic M[employer] Brand  . montelukast (SINGULAIR) 10 MG tablet    Sig: TAKE 1 TABLET BY MOUTH EVERYDAY AT BEDTIME    Dispense:  30 tablet    Refill:  5    Please keep appt for additional refills.  . Olopatadine HCl 0.2 % SOLN    Sig: Place 1 drop into both eyes daily as needed.    Dispense:  2.5 mL    Refill:  5  . levocetirizine (XYZAL) 5 MG tablet    Sig: Take 1 tablet (5 mg total) by mouth every evening.    Dispense:  30 tablet    Refill:  5  . fluticasone (FLOVENT HFA) 220 MCG/ACT inhaler    Sig: Inhale 2 puffs into the lungs in the morning and at bedtime.    Dispense:  12 g    Refill:  5  . fluticasone (FLONASE) 50 MCG/ACT nasal spray    Sig: Place 1 spray into both nostrils daily.    Dispense:  16 g    Refill:  5  . triamcinolone ointment (KENALOG) 0.1 %    Sig:  Apply 1 application topically 2 (two) times daily.    Dispense:  60 g    Refill:  5  . levalbuterol (XOPENEX HFA) 45 MCG/ACT inhaler    Sig: Inhale 2 puffs into the lungs every 6 (six) hours as needed for wheezing.    Dispense:  1 Inhaler    Refill:  5    Patient Instructions  Moderate persistent asthma    Stop Symbicort Begin Flovent 220-2 puffs twice a day with a spacer to prevent cough or wheeze Restart montelukast 10 mg once a day to prevent cough or wheeze Stop albuteral and begin Xopenex 2 puffs every 4 hours as needed for cough or wheeze  Seasonal and perennial allergic rhinitis (indoor molds, outdoor molds, cockroach, dust mites) Continue allergen avoidance measures as listed below Begin Xyzal 5 mg once a day as needed for a runny nose Begin Flonase 1 spray in each nostril once a day as needed for a stuffy nose.  In  the right nostril, point the applicator out toward the right ear. In the left nostril, point the applicator out toward the left ear Consider saline nasal rinses as needed for nasal symptoms. Use this before any medicated nasal sprays for best result  Allergic conjunctivitis Continue Pataday one drop per eye once daily as needed.   History of allergy to stinging insect and fire ant bite Continue to avoid stinging insects and fire ants. In case of an allergic reaction, take Benadryl 50 mg every 4 hours, and if life-threatening symptoms occur, inject with EpiPen 0.3 mg.  Allergy to metal Continue to avoid triggering metals (nickel, chromium, copper sulfate)  Atopic dermatitis Continue a daily moisturizer For red itchy areas below your face, apply triamcinolone 0.1% ointment twice a day as needed  Follow up in 2 months or sooner if needed  Call the clinic if this treatment plan is not working well for you or if your symptoms worsen, do not improve, or you develop a fever   No follow-ups on file.    Thank you for the opportunity to care for this patient.   Please do not hesitate to contact me with questions.  Gareth Morgan, FNP [facility]

## 2020-03-29 NOTE — Patient Instructions (Addendum)
Moderate persistent asthma    Stop Symbicort Begin Flovent 220-2 puffs twice a day with a spacer to prevent cough or wheeze Restart montelukast 10 mg once a day to prevent cough or wheeze Stop albuteral and begin Xopenex 2 puffs every 4 hours as needed for cough or wheeze  Seasonal and perennial allergic rhinitis (indoor molds, outdoor molds, cockroach, dust mites) Continue allergen avoidance measures as listed below Begin Xyzal 5 mg once a day as needed for a runny nose Begin Flonase 1 spray in each nostril once a day as needed for a stuffy nose.  In the right nostril, point the applicator out toward the right ear. In the left nostril, point the applicator out toward the left ear Consider saline nasal rinses as needed for nasal symptoms. Use this before any medicated nasal sprays for best result  Allergic conjunctivitis Continue Pataday one drop per eye once daily as needed.   History of allergy to stinging insect and fire ant bite Continue to avoid stinging insects and fire ants. In case of an allergic reaction, take Benadryl 50 mg every 4 hours, and if life-threatening symptoms occur, inject with EpiPen 0.3 mg.  Allergy to metal Continue to avoid triggering metals (nickel, chromium, copper sulfate)  Atopic dermatitis Continue a daily moisturizer For red itchy areas below your face, apply triamcinolone 0.1% ointment twice a day as needed  Follow up in 2 months or sooner if needed  Call the clinic if this treatment plan is not working well for you or if your symptoms worsen, do not improve, or you develop a fever

## 2020-04-03 ENCOUNTER — Ambulatory Visit (INDEPENDENT_AMBULATORY_CARE_PROVIDER_SITE_OTHER): Payer: Medicare Other | Admitting: Family Medicine

## 2020-04-03 ENCOUNTER — Ambulatory Visit (INDEPENDENT_AMBULATORY_CARE_PROVIDER_SITE_OTHER)
Admission: RE | Admit: 2020-04-03 | Discharge: 2020-04-03 | Disposition: A | Discharge status: Home / Self Care | Payer: Medicare Other | Source: Ambulatory Visit | Attending: Family Medicine | Admitting: Family Medicine

## 2020-04-03 ENCOUNTER — Other Ambulatory Visit: Payer: Self-pay

## 2020-04-03 ENCOUNTER — Encounter: Payer: Self-pay | Admitting: Family Medicine

## 2020-04-03 ENCOUNTER — Other Ambulatory Visit (INDEPENDENT_AMBULATORY_CARE_PROVIDER_SITE_OTHER): Payer: Self-pay | Admitting: Physician Assistant

## 2020-04-03 VITALS — BP 130/84 | HR 73 | Temp 97.8°F | Resp 12 | Ht 64.0 in | Wt 247.1 lb

## 2020-04-03 DIAGNOSIS — R1011 Right upper quadrant pain: Secondary | ICD-10-CM

## 2020-04-03 DIAGNOSIS — R0781 Pleurodynia: Secondary | ICD-10-CM

## 2020-04-03 DIAGNOSIS — I10 Essential (primary) hypertension: Secondary | ICD-10-CM

## 2020-04-03 LAB — HEPATIC FUNCTION PANEL
ALT: 45 U/L — ABNORMAL HIGH (ref 0–35)
AST: 23 U/L (ref 0–37)
Albumin: 4.2 g/dL (ref 3.5–5.2)
Alkaline Phosphatase: 73 U/L (ref 39–117)
Bilirubin, Direct: 0.1 mg/dL (ref 0.0–0.3)
Total Bilirubin: 0.5 mg/dL (ref 0.2–1.2)
Total Protein: 6 g/dL (ref 6.0–8.3)

## 2020-04-03 NOTE — Progress Notes (Signed)
ACUTE VISIT   Chief Complaint  Patient presents with  . Abdominal Pain    Right-sided abdominal pain that started 2 days ago, constant pain, possible injury   HPI: [name] is a 54 y.o. female with hx of fibromyalgia,PTSD, DM II,OSA, asthma, and atrial fib who is here today with above complaint. Earlier same day she was helping to move furniture down a ramp,she had to move fast to prevent it from falling. She did not have pain right then. Gradual onset. Pain is localized around rib cage and RUQ. Pain is not radiated, constant dull and when she moves it is sharp, 6/10, not radiated. Also exacerbated by deep breathing and palpation. Alleviated by rest.  She has taken Tylenol. For chronic pain she is on Hydrocodone-Acetaminophen 5-325 mg q 6 hours.  No associated fever,chills,sore throat,cough,wheezing,vomiting,changes in bowel habits,dysuria,gross hematuria,or skin rash. S/P cholecystectomy. + Nausea, attributed to pain intensity. She has rested today, so pain has improved. No changes in bowel habits. Colonoscopy in 07/2017: Multiple small-mouthed diverticulosis sigmoid colon.  She has hx of RA so has pain most of the time. She follows with rheumatologist.  Review of Systems  Constitutional: Positive for activity change and fatigue. Negative for appetite change and chills.  HENT: Negative for mouth sores, nosebleeds and trouble swallowing.   Respiratory: Negative for shortness of breath.   Cardiovascular: Negative for palpitations and leg swelling.  Gastrointestinal: Negative for abdominal distention and blood in stool.  Genitourinary: Negative for decreased urine volume and difficulty urinating.  Musculoskeletal: Positive for arthralgias, back pain and myalgias. Negative for gait problem.  Skin: Negative for color change and rash.  Neurological: Negative for syncope and weakness.  Rest see pertinent positives and negatives per HPI.  Current Outpatient  Medications on File Prior to Visit  Medication Sig Dispense Refill  . ACTEMRA 162 MG/0.9ML SOSY every 7 (seven) days.     Marland Kitchen aspirin EC 81 MG tablet Take 1 tablet (81 mg total) by mouth daily. 90 tablet 3  . atomoxetine (STRATTERA) 40 MG capsule Take 1 capsule (40 mg total) by mouth 2 (two) times daily. 180 capsule 0  . clonazePAM (KLONOPIN) 0.5 MG tablet Take 1 tablet (0.5 mg total) by mouth 3 (three) times daily as needed for anxiety. 90 tablet 2  . EPINEPHrine (EPIPEN 2-PAK) 0.3 mg/0.3 mL IJ SOAJ injection Use for life threatening allergic reactions 2 each 1  . flecainide (TAMBOCOR) 50 MG tablet Take 1 tablet (50 mg total) by mouth 2 (two) times daily. 60 tablet 11  . fluticasone (FLONASE) 50 MCG/ACT nasal spray Place 1 spray into both nostrils daily. 16 g 5  . fluticasone (FLOVENT HFA) 220 MCG/ACT inhaler Inhale 2 puffs into the lungs in the morning and at bedtime. 12 g 5  . HYDROcodone-acetaminophen (NORCO/VICODIN) 5-325 MG tablet Take 1 tablet by mouth every 6 (six) hours as needed for moderate pain.    Marland Kitchen levalbuterol (XOPENEX HFA) 45 MCG/ACT inhaler Inhale 2 puffs into the lungs every 6 (six) hours as needed for wheezing. 1 Inhaler 5  . levocetirizine (XYZAL) 5 MG tablet Take 1 tablet (5 mg total) by mouth every evening. 30 tablet 5  . metFORMIN (GLUCOPHAGE) 500 MG tablet Take 1 tablet (500 mg total) by mouth daily with breakfast. 30 tablet 0  . metoprolol succinate (TOPROL-XL) 25 MG 24 hr tablet Take 25 mg by mouth daily.    . montelukast (SINGULAIR) 10 MG tablet TAKE 1 TABLET BY MOUTH EVERYDAY  AT BEDTIME 30 tablet 5  . nabumetone (RELAFEN) 500 MG tablet Take 500 mg by mouth 3 (three) times daily.  3  . nitroGLYCERIN (NITROSTAT) 0.4 MG SL tablet Place 1 tablet (0.4 mg total) under the tongue every 5 (five) minutes as needed for chest pain. 25 tablet prn  . Olopatadine HCl 0.2 % SOLN Place 1 drop into both eyes daily as needed. 2.5 mL 5  . omeprazole (PRILOSEC) 40 MG capsule TAKE 1 CAPSULE  BY MOUTH EVERY DAY 30 MINUTES BEFORE BREAKFAST 90 capsule 2  . pregabalin (LYRICA) 200 MG capsule Take 200 mg by mouth 3 (three) times daily.     Marland Kitchen tiZANidine (ZANAFLEX) 4 MG tablet TAKE 1 TABLET (4 MG TOTAL) BY MOUTH EVERY 12 (TWELVE) HOURS AS NEEDED FOR MUSCLE SPASMS. 180 tablet 1  . triamcinolone ointment (KENALOG) 0.1 % Apply 1 application topically 2 (two) times daily. 60 g 5  . Vilazodone HCl (VIIBRYD) 20 MG TABS Take 1 tablet (20 mg total) by mouth daily. 90 tablet 0  . Vitamin D, Ergocalciferol, (DRISDOL) 1.25 MG (50000 UNIT) CAPS capsule Take 1 capsule (50,000 Units total) by mouth every 7 (seven) days. 4 capsule 0   Current Facility-Administered Medications on File Prior to Visit  Medication Dose Route Frequency Provider Last Rate Last Admin  . 0.9 %  sodium chloride infusion  500 mL Intravenous Continuous Milus Banister, MD       Past Medical History:  Diagnosis Date  . A-fib ([place])   . ADD (attention deficit disorder)   . Anemia   . Anginal pain ([place])   . Anxiety   . Asthma   . Back pain   . Bone spur of foot   . Chest pain   . Chronic fatigue syndrome   . COPD (chronic obstructive pulmonary disease) ([place])   . Depression   . Dyspnea   . Fibromyalgia   . GERD (gastroesophageal reflux disease)   . Headache    "weekly-monthly" (06/04/2017)  . History of gout   . History of kidney stones   . Hypertension   . IBS (irritable bowel syndrome)   . Joint pain   . Left ankle pain   . Left sciatic nerve pain   . Myofascial pain   . Neck pain   . OSA (obstructive sleep apnea)    "getting ready to retest" (06/04/2017)  . Palpitations   . Rheumatoid arthritis ([place])   . Seasonal allergies    Allergies  Allergen Reactions  . Eggs Or Egg-Derived Products Diarrhea  . Latex Anaphylaxis, Hives and Itching    "Anaphylaxis is only when I am in a closed area (ex: car with balloons)"  . Other Other (See Comments)    Sinus headache from new plastics, carpets, etc.  . Codeine      Headaches     Social History   Socioeconomic History  . Marital status: Married    Spouse name: Merrily Klammer  . Number of children: 0  . Years of education: Not on file  . Highest education level: Not on file  Occupational History  . Occupation: not employed-disabled  Tobacco Use  . Smoking status: Never Smoker  . Smokeless tobacco: Never Used  Substance and Sexual Activity  . Alcohol use: No  . Drug use: No  . Sexual activity: Never  Other Topics Concern  . Not on file  Social History Narrative   Born in [place], [place]   Unemployed, on disability since  1991 for anxiety,    Education: [employer]   Single, no children, lives with her friend (used to live with her mother with stroke, now in ALF)      Social Determinants of Health   Financial Resource Strain:   . Difficulty of Paying Living Expenses:   Food Insecurity:   . Worried About Charity fundraiser in the Last Year:   . Arboriculturist in the Last Year:   Transportation Needs:   . Film/video editor (Medical):   Marland Kitchen Lack of Transportation (Non-Medical):   Physical Activity:   . Days of Exercise per Week:   . Minutes of Exercise per Session:   Stress:   . Feeling of Stress :   Social Connections:   . Frequency of Communication with Friends and Family:   . Frequency of Social Gatherings with Friends and Family:   . Attends Religious Services:   . Active Member of Clubs or Organizations:   . Attends Archivist Meetings:   Marland Kitchen Marital Status:     Vitals:   04/03/20 1410  BP: 130/84  Pulse: 73  Resp: 12  Temp: 97.8 F (36.6 C)  SpO2: 97%   Body mass index is 42.42 kg/m.  Physical Exam  Nursing note and vitals reviewed. Constitutional: She is oriented to person, place, and time. She appears well-developed. No distress.  HENT:  Head: Normocephalic and atraumatic.  Mouth/Throat: Oropharynx is clear and moist and mucous membranes are normal.  Eyes: Pupils  are equal, round, and reactive to light. Conjunctivae are normal.  Cardiovascular: Normal rate and regular rhythm.  No murmur heard. Pulses:      Dorsalis pedis pulses are 2+ on the right side and 2+ on the left side.  Respiratory: Effort normal and breath sounds normal. No respiratory distress.  GI: Soft. She exhibits no mass. There is no hepatomegaly. There is abdominal tenderness in the right upper quadrant. There is no rigidity, no rebound, no guarding, no CVA tenderness, no tenderness at McBurney's point and negative Murphy's sign.    Musculoskeletal:        General: No edema.     Comments: Pain upon palpation of lateral and anterior aspect of right rib cage pain. Pain also exacerbated by movement and deep breathing.  Lymphadenopathy:    She has no cervical adenopathy.  Neurological: She is alert and oriented to person, place, and time. She has normal strength. No cranial nerve deficit. Gait normal.  Skin: Skin is warm. No rash noted. No erythema.  Psychiatric: She has a normal mood and affect.  Well groomed, good eye contact.   ASSESSMENT AND PLAN:  Ms. Joury was seen today for abdominal pain.  Diagnoses and all orders for this visit:  Costal margin pain Musculoskeletal. Instructed to avoid shallow breathing. Monitor for new symptoms and skin rash.  -     DG Ribs Unilateral W/Chest Right; Future  RUQ pain Possible etiologies discussed. Seems musculoskeletal. Instructed about warning signs. She is already on chronic opioid use for pain. Further recommendations according to lab results. Instructed about warning signs.  -     Hepatic function panel -     CBC with Differential/Platelet  Return if symptoms worsen or fail to improve.   Carnesha Maravilla G. [place], MD  [facility]. [place] office.  A few things to remember from today's visit:   Local ice. Over the counter icy hot or asper cream may help. Avoid shallow breathing. Monitor for new  symptoms.  Please be sure medication list is accurate. If a new problem present, please set up appointment sooner than planned today.

## 2020-04-03 NOTE — Patient Instructions (Signed)
A few things to remember from today's visit:   Local ice. Over the counter icy hot or asper cream may help. Avoid shallow breathing. Monitor for new symptoms.  Please be sure medication list is accurate. If a new problem present, please set up appointment sooner than planned today.

## 2020-04-04 ENCOUNTER — Telehealth: Payer: Self-pay | Admitting: Allergy & Immunology

## 2020-04-04 LAB — CBC WITH DIFFERENTIAL/PLATELET
Basophils Absolute: 0 10*3/uL (ref 0.0–0.1)
Basophils Relative: 0.4 % (ref 0.0–3.0)
Eosinophils Absolute: 0.2 10*3/uL (ref 0.0–0.7)
Eosinophils Relative: 2 % (ref 0.0–5.0)
HCT: 38.4 % (ref 36.0–46.0)
Hemoglobin: 13 g/dL (ref 12.0–15.0)
Lymphocytes Relative: 16.3 % (ref 12.0–46.0)
Lymphs Abs: 1.3 10*3/uL (ref 0.7–4.0)
MCHC: 33.9 g/dL (ref 30.0–36.0)
MCV: 86.3 fl (ref 78.0–100.0)
Monocytes Absolute: 0.6 10*3/uL (ref 0.1–1.0)
Monocytes Relative: 7.5 % (ref 3.0–12.0)
Neutro Abs: 5.9 10*3/uL (ref 1.4–7.7)
Neutrophils Relative %: 73.8 % (ref 43.0–77.0)
Platelets: 197 10*3/uL (ref 150.0–400.0)
RBC: 4.44 Mil/uL (ref 3.87–5.11)
RDW: 14.6 % (ref 11.5–15.5)
WBC: 8 10*3/uL (ref 4.0–10.5)

## 2020-04-04 NOTE — Telephone Encounter (Signed)
Anne please advise. I believe that the patient is asking if there should be any cause for concern with her receiving this injection.

## 2020-04-04 NOTE — Telephone Encounter (Signed)
Patient called and wants to know about getting injection in her knee. The names of it is [place], Euflexxa. [phone].

## 2020-04-05 NOTE — Telephone Encounter (Signed)
Please have this patient ask how this product is packaged. Make sure there is no latex in the packaging. Thank you

## 2020-04-08 NOTE — Telephone Encounter (Signed)
Called and informed patient. Patient verbalized understanding and will call back if there is latex in packaging or if she needs anything further.

## 2020-04-09 ENCOUNTER — Encounter: Payer: Self-pay | Admitting: Family Medicine

## 2020-04-12 ENCOUNTER — Other Ambulatory Visit (INDEPENDENT_AMBULATORY_CARE_PROVIDER_SITE_OTHER): Payer: Self-pay | Admitting: Physician Assistant

## 2020-04-12 DIAGNOSIS — R7303 Prediabetes: Secondary | ICD-10-CM

## 2020-04-15 ENCOUNTER — Encounter: Payer: Self-pay | Admitting: Family Medicine

## 2020-04-15 ENCOUNTER — Other Ambulatory Visit: Payer: Self-pay | Admitting: Family Medicine

## 2020-04-15 DIAGNOSIS — R7401 Elevation of levels of liver transaminase levels: Secondary | ICD-10-CM

## 2020-04-16 ENCOUNTER — Other Ambulatory Visit (INDEPENDENT_AMBULATORY_CARE_PROVIDER_SITE_OTHER): Payer: Medicare Other

## 2020-04-16 ENCOUNTER — Other Ambulatory Visit: Payer: Self-pay

## 2020-04-16 ENCOUNTER — Ambulatory Visit (INDEPENDENT_AMBULATORY_CARE_PROVIDER_SITE_OTHER): Payer: Medicare Other | Admitting: Psychology

## 2020-04-16 DIAGNOSIS — R7401 Elevation of levels of liver transaminase levels: Secondary | ICD-10-CM | POA: Diagnosis not present

## 2020-04-16 DIAGNOSIS — F331 Major depressive disorder, recurrent, moderate: Secondary | ICD-10-CM

## 2020-04-17 DIAGNOSIS — Z6841 Body Mass Index (BMI) 40.0 and over, adult: Secondary | ICD-10-CM | POA: Diagnosis not present

## 2020-04-17 DIAGNOSIS — R7989 Other specified abnormal findings of blood chemistry: Secondary | ICD-10-CM | POA: Diagnosis not present

## 2020-04-17 DIAGNOSIS — Z79899 Other long term (current) drug therapy: Secondary | ICD-10-CM | POA: Diagnosis not present

## 2020-04-17 DIAGNOSIS — M0609 Rheumatoid arthritis without rheumatoid factor, multiple sites: Secondary | ICD-10-CM | POA: Diagnosis not present

## 2020-04-17 DIAGNOSIS — M2550 Pain in unspecified joint: Secondary | ICD-10-CM | POA: Diagnosis not present

## 2020-04-17 DIAGNOSIS — M797 Fibromyalgia: Secondary | ICD-10-CM | POA: Diagnosis not present

## 2020-04-17 LAB — HEPATITIS C ANTIBODY
Hepatitis C Ab: NONREACTIVE
SIGNAL TO CUT-OFF: 0 (ref <1.00)

## 2020-04-17 LAB — HEPATITIS B SURFACE ANTIBODY,QUALITATIVE: Hep B S Ab: NONREACTIVE

## 2020-04-17 LAB — HEPATITIS B SURFACE ANTIGEN: Hepatitis B Surface Ag: NONREACTIVE

## 2020-04-18 ENCOUNTER — Telehealth (INDEPENDENT_AMBULATORY_CARE_PROVIDER_SITE_OTHER): Payer: Medicare Other | Admitting: Psychiatry

## 2020-04-18 ENCOUNTER — Encounter (HOSPITAL_COMMUNITY): Payer: Self-pay | Admitting: Psychiatry

## 2020-04-18 ENCOUNTER — Other Ambulatory Visit: Payer: Self-pay

## 2020-04-18 DIAGNOSIS — F900 Attention-deficit hyperactivity disorder, predominantly inattentive type: Secondary | ICD-10-CM | POA: Diagnosis not present

## 2020-04-18 DIAGNOSIS — F331 Major depressive disorder, recurrent, moderate: Secondary | ICD-10-CM | POA: Diagnosis not present

## 2020-04-18 DIAGNOSIS — F4310 Post-traumatic stress disorder, unspecified: Secondary | ICD-10-CM

## 2020-04-18 MED ORDER — ATOMOXETINE HCL 40 MG PO CAPS: 40.0000 mg | ORAL_CAPSULE | Freq: Two times a day (BID) | ORAL | 0 refills | Status: DC

## 2020-04-18 MED ORDER — CLONAZEPAM 0.5 MG PO TABS: 0.5000 mg | ORAL_TABLET | Freq: Three times a day (TID) | ORAL | 2 refills | Status: DC | PRN

## 2020-04-18 MED ORDER — VIIBRYD 20 MG PO TABS: 20.0000 mg | ORAL_TABLET | Freq: Every day | ORAL | 0 refills | Status: DC

## 2020-04-18 NOTE — Progress Notes (Signed)
Virtual Visit via Telephone Note  I connected with Dawn Jordan on 04/18/20 at 10:00 AM EDT by telephone and verified that I am speaking with the correct person using two identifiers.  Location: Patient: in car Provider: office   I discussed the limitations, risks, security and privacy concerns of performing an evaluation and management service by telephone and the availability of in person appointments. I also discussed with the patient that there may be a patient responsible charge related to this service. The patient expressed understanding and agreed to proceed.   History of Present Illness: Dawn Jordan is having a lot of abdominal pain and is working with her rheumatologist. The testing and waiting on the resulting caused her a lot of anxiety. Her depression comes and goes. She is feeling sad because she is not losing weight despite working with the weight loss clinic. Her depression is manageable. She is working with psychologist and it is helping. She has fewer episodes of hopelessness. Dawn Jordan is working on focusing on the positive things daily. She denies SI/HI. Dawn Jordan still has nightmares at least once a week. Her mom is still at an ALF. Mom is a trigger and causes intrusive memories but Dawn Jordan still loves her mom. Dawn Jordan is always stressed out and admits to having frequent racing thoughts.. She has not noticed any difference in mood since stopping Lithium. She does notice an improvement in concentration and productivity with the increase dose of Strattera.    Observations/Objective:  General Appearance: unable to assess  Eye Contact:  unable to assess  Speech:  Clear and Coherent and Normal Rate  Volume:  Normal  Mood:  Anxious and Depressed  Affect:  Congruent  Thought Process:  Coherent and Descriptions of Associations: Circumstantial  Orientation:  Full (Time, Place, and Person)  Thought Content:  Logical  Suicidal Thoughts:  No  Homicidal Thoughts:  No  Memory:  Immediate;   Good   Judgement:  Good  Insight:  Good  Psychomotor Activity: unable to assess  Concentration:  Concentration: Good  Recall:  Good  Fund of Knowledge:  Good  Language:  Good  Akathisia:  unable to assess  Handed:  Right  AIMS (if indicated):     Assets:  Communication Skills Desire for Improvement Financial Resources/Insurance Housing Resilience Talents/Skills Transportation Vocational/Educational  ADL's:  unable to assess  Cognition:  WNL  Sleep:         Assessment and Plan: PTSD; MDD- recurrent, moderate; ADHD- inattentive type  Klonopin 0.5mg  po TID prn anxiety Viibryd 20mg  po qD Strattera 40mg  po BID   Follow Up Instructions: In 2-3 months or sooner if needed   I discussed the assessment and treatment plan with the patient. The patient was provided an opportunity to ask questions and all were answered. The patient agreed with the plan and demonstrated an understanding of the instructions.   The patient was advised to call back or seek an in-person evaluation if the symptoms worsen or if the condition fails to improve as anticipated.  I provided 20 minutes of non-face-to-face time during this encounter.   Charlcie Cradle, MD

## 2020-04-24 ENCOUNTER — Ambulatory Visit
Admission: RE | Admit: 2020-04-24 | Discharge: 2020-04-24 | Disposition: A | Discharge status: Home / Self Care | Payer: Medicare Other | Source: Ambulatory Visit | Attending: Family Medicine | Admitting: Family Medicine

## 2020-04-24 DIAGNOSIS — R7401 Elevation of levels of liver transaminase levels: Secondary | ICD-10-CM

## 2020-04-24 DIAGNOSIS — R10811 Right upper quadrant abdominal tenderness: Secondary | ICD-10-CM | POA: Diagnosis not present

## 2020-04-29 ENCOUNTER — Other Ambulatory Visit: Payer: Self-pay

## 2020-04-29 ENCOUNTER — Ambulatory Visit (INDEPENDENT_AMBULATORY_CARE_PROVIDER_SITE_OTHER): Payer: Medicare Other | Admitting: Family Medicine

## 2020-04-29 ENCOUNTER — Encounter: Payer: Self-pay | Admitting: Family Medicine

## 2020-04-29 ENCOUNTER — Ambulatory Visit: Payer: Self-pay

## 2020-04-29 VITALS — BP 125/85 | HR 73 | Ht 64.0 in | Wt 247.0 lb

## 2020-04-29 DIAGNOSIS — M170 Bilateral primary osteoarthritis of knee: Secondary | ICD-10-CM

## 2020-04-29 MED ORDER — KETOROLAC TROMETHAMINE 30 MG/ML IJ SOLN
30.0000 mg | Freq: Once | INTRAMUSCULAR | Status: AC
  Administered 2020-04-29: 30 mg via INTRA_ARTICULAR

## 2020-04-29 NOTE — Patient Instructions (Signed)
Nice to meet you Please continue icing  Please try the exercises  Please try the rayos. Take it as close to 10 pm as you can.   Please send me a message in MyChart with any questions or updates.  If you would like to try the gel injections, we can order them and call you when they arrive.  Please follow up for gel injections or in 4 weeks. .   --Dr. Raeford Razor

## 2020-04-29 NOTE — Progress Notes (Signed)
Dawn Jordan - 54 y.o. female MRN [id number]  Date of birth: 1966-04-12  SUBJECTIVE:  Including CC & ROS.  Chief Complaint  Patient presents with  . Knee Pain    bilateral knee    Dawn Jordan is a 54 y.o. female that is presenting with acute on chronic bilateral knee pain.  She has received steroid injection in the past.  She has had 3 knee surgeries in the right knee and 2 knees injuries in the left knee.  The pain is becoming more constant and severe.  She gets limited improvement with injections today.  She denies numbness or tingling.  Pain is in the medial joint space bilaterally.   Review of Systems See HPI   HISTORY: Past Medical, Surgical, Social, and Family History Reviewed & Updated per EMR.   Pertinent Historical Findings include:  Past Medical History:  Diagnosis Date  . A-fib ([place])   . ADD (attention deficit disorder)   . Anemia   . Anginal pain ([place])   . Anxiety   . Asthma   . Back pain   . Bone spur of foot   . Chest pain   . Chronic fatigue syndrome   . COPD (chronic obstructive pulmonary disease) ([place])   . Depression   . Dyspnea   . Fibromyalgia   . GERD (gastroesophageal reflux disease)   . Headache    "weekly-monthly" (06/04/2017)  . History of gout   . History of kidney stones   . Hypertension   . IBS (irritable bowel syndrome)   . Joint pain   . Left ankle pain   . Left sciatic nerve pain   . Myofascial pain   . Neck pain   . OSA (obstructive sleep apnea)    "getting ready to retest" (06/04/2017)  . Palpitations   . Rheumatoid arthritis ([place])   . Seasonal allergies     Past Surgical History:  Procedure Laterality Date  . CARPAL TUNNEL RELEASE Left   . CYST EXCISION     "little cysts taken off both hands and left arm" (06/04/2017)  . KNEE ARTHROSCOPY Bilateral    "3 on my left; 2 on my right" (06/04/2017)  . LAPAROSCOPIC CHOLECYSTECTOMY    . TONSILLECTOMY      Family History  Problem Relation Age of Onset  . Arthritis Mother   .  Hyperlipidemia Mother   . Hypertension Mother   . Stroke Mother   . Mental retardation Mother   . Diabetes Mother   . Allergic rhinitis Mother   . Heart disease Mother   . Thyroid disease Mother   . Depression Mother   . Anxiety disorder Mother   . Bipolar disorder Mother   . Sleep apnea Mother   . Eating disorder Mother   . Diabetes Maternal Grandfather   . Hypertension Maternal Grandfather   . Diabetes Paternal Grandmother   . Hypertension Paternal Grandmother   . Cancer Paternal Grandmother        breast  . Allergic rhinitis Brother   . Colon cancer Paternal Aunt   . Lung cancer Other   . Lung cancer Maternal Uncle   . Asthma Neg Hx   . Eczema Neg Hx   . Immunodeficiency Neg Hx   . Urticaria Neg Hx   . Atopy Neg Hx   . Angioedema Neg Hx   . Esophageal cancer Neg Hx   . Stomach cancer Neg Hx   . Rectal cancer Neg Hx     Social History  Socioeconomic History  . Marital status: Married    Spouse name: Dioselyn Kretsch  . Number of children: 0  . Years of education: Not on file  . Highest education level: Not on file  Occupational History  . Occupation: not employed-disabled  Tobacco Use  . Smoking status: Never Smoker  . Smokeless tobacco: Never Used  Substance and Sexual Activity  . Alcohol use: No  . Drug use: No  . Sexual activity: Never  Other Topics Concern  . Not on file  Social History Narrative   Born in [place], [place]"   Unemployed, on disability since 1991 for anxiety,    Education: [employer]   Single, no children, lives with her friend (used to live with her mother with stroke, now in ALF)      Social Determinants of Health   Financial Resource Strain:   . Difficulty of Paying Living Expenses:   Food Insecurity:   . Worried About Charity fundraiser in the Last Year:   . Arboriculturist in the Last Year:   Transportation Needs:   . Film/video editor (Medical):   Marland Kitchen Lack of Transportation (Non-Medical):     Physical Activity:   . Days of Exercise per Week:   . Minutes of Exercise per Session:   Stress:   . Feeling of Stress :   Social Connections:   . Frequency of Communication with Friends and Family:   . Frequency of Social Gatherings with Friends and Family:   . Attends Religious Services:   . Active Member of Clubs or Organizations:   . Attends Archivist Meetings:   Marland Kitchen Marital Status:   Intimate Partner Violence:   . Fear of Current or Ex-Partner:   . Emotionally Abused:   Marland Kitchen Physically Abused:   . Sexually Abused:      PHYSICAL EXAM:  VS: BP 125/85   Pulse 73   Ht 5\' 4"  (1.626 m)   Wt 247 lb (112 kg)   LMP  (LMP Unknown)   BMI 42.40 kg/m  Physical Exam Gen: NAD, alert, cooperative with exam, well-appearing MSK:  Right and left knee: Mild effusion in each knee. Limited flexion. Tenderness to palpation of the medial joint space. Instability valgus varus stress testing. Negative McMurray's test. Neurovascular intact   Aspiration/Injection Procedure Note Arelys Arabie 1966/01/22  Procedure: Injection Indications: Left knee pain  Procedure Details Consent: Risks of procedure as well as the alternatives and risks of each were explained to the (patient/caregiver).  Consent for procedure obtained. Time Out: Verified patient identification, verified procedure, site/side was marked, verified correct patient position, special equipment/implants available, medications/allergies/relevent history reviewed, required imaging and test results available.  Performed.  The area was cleaned with iodine and alcohol swabs.    The left knee superior lateral suprapatellar pouch was injected using 1 cc's of 30 mg Toradol and 4 cc's of 0.25% bupivacaine with a 22 1 1/2" needle.  Ultrasound was used. Images were obtained in long views showing the injection.     A sterile dressing was applied.  Patient did tolerate procedure well.  Aspiration/Injection Procedure Note Valina Hoeppner 11-Aug-1966  Procedure: Injection Indications: Right knee pain  Procedure Details Consent: Risks of procedure as well as the alternatives and risks of each were explained to the (patient/caregiver).  Consent for procedure obtained. Time Out: Verified patient identification, verified procedure, site/side was marked, verified correct patient position, special equipment/implants available, medications/allergies/relevent history reviewed, required imaging and  test results available.  Performed.  The area was cleaned with iodine and alcohol swabs.    The right knee superior lateral suprapatellar pouch was injected using 1 cc's of 30 mg Toradol and 4 cc's of 0.25% bupivacaine with a 22 1 1/2" needle.  Ultrasound was used. Images were obtained in long views showing the injection.     A sterile dressing was applied.  Patient did tolerate procedure well.   ASSESSMENT & PLAN:   Primary osteoarthritis of both knees Acute on chronic in nature.  Has had previous surgeries in each knee.  Pain likely degenerative in nature. -Toradol injections bilaterally. -Provided Rayos sample -Counseled on home exercise therapy and supportive care. -Can consider updated imaging. -Proceed with gel injections.

## 2020-04-29 NOTE — Progress Notes (Signed)
Medication Samples have been provided to the patient.  Drug name: Rayos       Strength: 5mg         Qty: 1 Box  LOT: [id number]  Exp.Date: 10/2020  Dosing instructions: Take 1 tablet by mouth at bedtime.  The patient has been instructed regarding the correct time, dose, and frequency of taking this medication, including desired effects and most common side effects.   Sherrie George, [place] 4:26 PM 04/29/2020

## 2020-04-29 NOTE — Assessment & Plan Note (Signed)
Acute on chronic in nature.  Has had previous surgeries in each knee.  Pain likely degenerative in nature. -Toradol injections bilaterally. -Provided Rayos sample -Counseled on home exercise therapy and supportive care. -Can consider updated imaging. -Proceed with gel injections.

## 2020-05-02 NOTE — Progress Notes (Signed)
Date:  05/07/2020   ID:  Dawn Jordan, DOB August 28, 1966, MRN [id number]  PCP:  [place], Betty G, MD  Cardiologist:  Candee Furbish, MD  Electrophysiologist:  None   Evaluation Performed:  Follow-Up Visit  Chief Complaint: 1 year follow-up  History of Present Illness:    Dawn Jordan is a 54 y.o. female with a hx of PAF, COPD, fibromyalgia, and chest pain.   She was previously followed by cardiology in [place]. She underwent a nuclear stress testing and EKGs which were reassuring.  She underwent stress testing 03/03/2019 which was low risk with no ischemia and an EF of 61%.  7-day monitor worn 07/05/2019 with hours of atrial fibrillation with RVR, occasional PVCs and PVCs.CHA2DS2-VASc was calculated at 1 therefore it was noted that she did not require anticoagulation. She had no CAD on chest CT of coronaries therefore she was placed on flecainide 50 mg p.o. twice daily along with Toprol 25 mg p.o. daily to suppress atrial fibrillation. She underwent an exercise tolerance test on 2 weeks after flecainide initiation which showed no evidence of QRS widening with flecainide and was reassuring overall exercise treadmill testing.    She was then seen by Dr. Radford Pax for sleep study evaluation and found to have obstructive sleep apnea.   Today [name] is doing well from a CV standpoint.  Reports occasional palpitations however episodes do not last very long with no associated symptoms.  She is not anticoagulated given low CHA2DS2VASc of 1.  If episodes become more frequent or longer in duration, may need repeat monitor to assess A. fib burden.  Discussed options for taking additional metoprolol if needed.  Patient understands and agrees. She denies chest pain, PND, orthopnea, dizziness or syncope.     The patient does not have symptoms concerning for COVID-19 infection (fever, chills, cough, or new shortness of breath).   Past Medical History:  Diagnosis Date  . A-fib ([place])   . ADD (attention  deficit disorder)   . Anemia   . Anginal pain ([place])   . Anxiety   . Asthma   . Back pain   . Bone spur of foot   . Chest pain   . Chronic fatigue syndrome   . COPD (chronic obstructive pulmonary disease) ([place])   . Depression   . Dyspnea   . Fibromyalgia   . GERD (gastroesophageal reflux disease)   . Headache    "weekly-monthly" (06/04/2017)  . History of gout   . History of kidney stones   . Hypertension   . IBS (irritable bowel syndrome)   . Joint pain   . Left ankle pain   . Left sciatic nerve pain   . Myofascial pain   . Neck pain   . OSA (obstructive sleep apnea)    "getting ready to retest" (06/04/2017)  . Palpitations   . Rheumatoid arthritis ([place])   . Seasonal allergies    Past Surgical History:  Procedure Laterality Date  . CARPAL TUNNEL RELEASE Left   . CYST EXCISION     "little cysts taken off both hands and left arm" (06/04/2017)  . KNEE ARTHROSCOPY Bilateral    "3 on my left; 2 on my right" (06/04/2017)  . LAPAROSCOPIC CHOLECYSTECTOMY    . TONSILLECTOMY       Current Meds  Medication Sig  . ACTEMRA 162 MG/0.9ML SOSY every 7 (seven) days.   Marland Kitchen aspirin EC 81 MG tablet Take 1 tablet (81 mg total) by mouth daily.  Marland Kitchen  atomoxetine (STRATTERA) 40 MG capsule Take 1 capsule (40 mg total) by mouth 2 (two) times daily.  . clonazePAM (KLONOPIN) 0.5 MG tablet Take 1 tablet (0.5 mg total) by mouth 3 (three) times daily as needed for anxiety.  Marland Kitchen EPINEPHrine (EPIPEN 2-PAK) 0.3 mg/0.3 mL IJ SOAJ injection Use for life threatening allergic reactions  . flecainide (TAMBOCOR) 50 MG tablet Take 1 tablet (50 mg total) by mouth 2 (two) times daily.  . fluticasone (FLONASE) 50 MCG/ACT nasal spray Place 1 spray into both nostrils daily.  . fluticasone (FLOVENT HFA) 220 MCG/ACT inhaler Inhale 2 puffs into the lungs in the morning and at bedtime.  Marland Kitchen HYDROcodone-acetaminophen (NORCO/VICODIN) 5-325 MG tablet Take 1 tablet by mouth every 6 (six) hours as needed for moderate pain.  Marland Kitchen  levalbuterol (XOPENEX HFA) 45 MCG/ACT inhaler Inhale 2 puffs into the lungs every 6 (six) hours as needed for wheezing.  Marland Kitchen levocetirizine (XYZAL) 5 MG tablet Take 1 tablet (5 mg total) by mouth every evening.  . metoprolol succinate (TOPROL-XL) 25 MG 24 hr tablet Take 25 mg by mouth daily.  . montelukast (SINGULAIR) 10 MG tablet TAKE 1 TABLET BY MOUTH EVERYDAY AT BEDTIME  . nabumetone (RELAFEN) 500 MG tablet Take 500 mg by mouth 3 (three) times daily.  . nitroGLYCERIN (NITROSTAT) 0.4 MG SL tablet Place 1 tablet (0.4 mg total) under the tongue every 5 (five) minutes as needed for chest pain.  Marland Kitchen Olopatadine HCl 0.2 % SOLN Place 1 drop into both eyes daily as needed.  Marland Kitchen omeprazole (PRILOSEC) 40 MG capsule TAKE 1 CAPSULE BY MOUTH EVERY DAY 30 MINUTES BEFORE BREAKFAST  . pregabalin (LYRICA) 200 MG capsule Take 200 mg by mouth 3 (three) times daily.   Marland Kitchen tiZANidine (ZANAFLEX) 4 MG tablet TAKE 1 TABLET (4 MG TOTAL) BY MOUTH EVERY 12 (TWELVE) HOURS AS NEEDED FOR MUSCLE SPASMS.  Marland Kitchen triamcinolone ointment (KENALOG) 0.1 % Apply 1 application topically 2 (two) times daily.  . Vilazodone HCl (VIIBRYD) 20 MG TABS Take 1 tablet (20 mg total) by mouth daily.   Current Facility-Administered Medications for the 05/07/20 encounter (Office Visit) with Tommie Raymond, NP  Medication  . 0.9 %  sodium chloride infusion     Allergies:   Eggs or egg-derived products, Latex, Other, and Codeine   Social History   Tobacco Use  . Smoking status: Never Smoker  . Smokeless tobacco: Never Used  Substance Use Topics  . Alcohol use: No  . Drug use: No     Family Hx: The patient's family history includes Allergic rhinitis in her brother and mother; Anxiety disorder in her mother; Arthritis in her mother; Bipolar disorder in her mother; Cancer in her paternal grandmother; Colon cancer in her paternal aunt; Depression in her mother; Diabetes in her maternal grandfather, mother, and paternal grandmother; Eating disorder in  her mother; Heart disease in her mother; Hyperlipidemia in her mother; Hypertension in her maternal grandfather, mother, and paternal grandmother; Lung cancer in her maternal uncle and another family member; Mental retardation in her mother; Sleep apnea in her mother; Stroke in her mother; Thyroid disease in her mother. There is no history of Asthma, Eczema, Immunodeficiency, Urticaria, Atopy, Angioedema, Esophageal cancer, Stomach cancer, or Rectal cancer.  ROS:   Please see the history of present illness.     All other systems reviewed and are negative.  Prior CV studies:   The following studies were reviewed today:  Nuclear stress test 2016 showed apical defect small could be shifting  breast artifact, some T wave inversions noted during Lexiscan infusion  Stress test 03/03/2019:   The left ventricular ejection fraction is normal (55-65%).  Nuclear stress EF: 61%.  No T wave inversion was noted during stress.  There was no ST segment deviation noted during stress.  This is a low risk study.  Defect 1: There is a small defect of mild severity at the apex   No reversible ischemia. Small fixed apical artifact, previously seen in 2016. LVEF 61% with normal wall motion. This is a low risk study.   7-day monitor 07/05/2019:   2 hours of Atrial fibrillation noted at night with rapid ventricular response  Occasional PAC's, premature atrial contractions, and PAT, paroxsymal atrial tachycardia (with brief abberency noted-benign)  No pauses   Given CHADSVASC of 1 (Female) she does not require anticoagulation at this time. No CAD on CT of coronaries I would like to try her on Flecainide 50mg  PO BID, with Toprol 25mg  PO QD to help suppress atrial fibrillation.  Normal EF Please have her come in for treadmill, ETT 1-2 weeks after starting flecainide.  Also, let's set her up for a sleep study (AFIB on monitor happened at night)  Have her come back in and see me in 4 weeks.    Labs/Other Tests and Data Reviewed:    EKG:  An ECG dated 05/07/20 was personally reviewed today and demonstrated:  NSR with one PVC, HR 72bpm  Recent Labs: 12/26/2019: BUN 13; Creatinine, Ser 0.68; Potassium 4.4; Sodium 143; TSH 0.405 04/03/2020: ALT 45; Hemoglobin 13.0; Platelets 197.0   Recent Lipid Panel Lab Results  Component Value Date/Time   CHOL 202 (H) 12/26/2019 12:57 PM   TRIG 198 (H) 12/26/2019 12:57 PM   HDL 42 12/26/2019 12:57 PM   CHOLHDL 5.2 06/05/2017 01:29 AM   LDLCALC 125 (H) 12/26/2019 12:57 PM    Wt Readings from Last 3 Encounters:  05/07/20 244 lb 9.6 oz (110.9 kg)  04/29/20 247 lb (112 kg)  04/03/20 247 lb 2 oz (112.1 kg)     Objective:    Vital Signs:  BP 124/80   Pulse 76   Ht 5\' 4"  (1.626 m)   Wt 244 lb 9.6 oz (110.9 kg)   LMP  (LMP Unknown)   SpO2 97%   BMI 41.99 kg/m    VITAL SIGNS:  reviewed GEN:  no acute distress EYES:  sclerae anicteric, EOMI - Extraocular Movements Intact RESPIRATORY:  normal respiratory effort, symmetric expansion NEURO:  alert and oriented x 3, no obvious focal deficit PSYCH:  normal affect  ASSESSMENT & PLAN:    1.  Paroxysmal atrial fibrillation: -Found on monitor from 07/05/2019 -Maintaining NSR on flecainide 50 mg p.o. twice daily, Toprol 25 mg p.o. daily -EKG stable today with QTC at 429 -Obtain lab work  2.  History of chest pain: -Most recent work-up has been negative for CAD -Continue ASA for primary prevention -Denies anginal symptoms  3.  Morbid obesity: -Encouraged weight loss  4.  Depression/anxiety/fibromyalgia: -Managed by PCP  5.  OSA: -Follows with Dr. Radford Pax -Asking about mask adjustments>> will send message to sleep study team   COVID-19 Education: The signs and symptoms of COVID-19 were discussed with the patient and how to seek care for testing (follow up with PCP or arrange E-visit). The importance of social distancing was discussed today.  Time:   Today, I have spent 20  minutes with the patient with telehealth technology discussing the above problems.  Medication Adjustments/Labs and Tests Ordered: Current medicines are reviewed at length with the patient today.  Concerns regarding medicines are outlined above.   Tests Ordered: Orders Placed This Encounter  Procedures  . Basic metabolic panel  . Magnesium  . EKG 12-Lead    Medication Changes: No orders of the defined types were placed in this encounter.   Follow Up:  Either In Person or Virtual [place] in 1 year  Signed, Kathyrn Drown, NP  05/07/2020 12:35 PM    [place]

## 2020-05-06 ENCOUNTER — Encounter: Payer: Self-pay | Admitting: Family Medicine

## 2020-05-07 ENCOUNTER — Encounter: Payer: Self-pay | Admitting: Cardiology

## 2020-05-07 ENCOUNTER — Ambulatory Visit (INDEPENDENT_AMBULATORY_CARE_PROVIDER_SITE_OTHER): Payer: Medicare Other | Admitting: Cardiology

## 2020-05-07 ENCOUNTER — Other Ambulatory Visit: Payer: Self-pay

## 2020-05-07 VITALS — BP 124/80 | HR 76 | Ht 64.0 in | Wt 244.6 lb

## 2020-05-07 DIAGNOSIS — Z8249 Family history of ischemic heart disease and other diseases of the circulatory system: Secondary | ICD-10-CM

## 2020-05-07 DIAGNOSIS — I4891 Unspecified atrial fibrillation: Secondary | ICD-10-CM | POA: Diagnosis not present

## 2020-05-07 DIAGNOSIS — Z79899 Other long term (current) drug therapy: Secondary | ICD-10-CM

## 2020-05-07 DIAGNOSIS — G4733 Obstructive sleep apnea (adult) (pediatric): Secondary | ICD-10-CM

## 2020-05-07 LAB — BASIC METABOLIC PANEL
BUN/Creatinine Ratio: 22 (ref 9–23)
BUN: 18 mg/dL (ref 6–24)
CO2: 22 mmol/L (ref 20–29)
Calcium: 9.7 mg/dL (ref 8.7–10.2)
Chloride: 105 mmol/L (ref 96–106)
Creatinine, Ser: 0.81 mg/dL (ref 0.57–1.00)
GFR calc Af Amer: 95 mL/min/{1.73_m2} (ref >59)
GFR calc non Af Amer: 83 mL/min/{1.73_m2} (ref >59)
Glucose: 100 mg/dL — ABNORMAL HIGH (ref 65–99)
Potassium: 4.6 mmol/L (ref 3.5–5.2)
Sodium: 141 mmol/L (ref 134–144)

## 2020-05-07 LAB — MAGNESIUM: Magnesium: 1.9 mg/dL (ref 1.6–2.3)

## 2020-05-07 NOTE — Patient Instructions (Signed)
Medication Instructions:   Your physician recommends that you continue on your current medications as directed. Please refer to the Current Medication list given to you today.  *If you need a refill on your cardiac medications before your next appointment, please call your pharmacy*  Lab Work:  You will have labs drawn today: BMET/Magnesium  If you have labs (blood work) drawn today and your tests are completely normal, you will receive your results only by: Marland Kitchen MyChart Message (if you have MyChart) OR . A paper copy in the mail If you have any lab test that is abnormal or we need to change your treatment, we will call you to review the results.  Testing/Procedures:  None ordered today  Follow-Up: At [facility], you and your health needs are our priority.  As part of our continuing mission to provide you with exceptional heart care, we have created designated Provider Care Teams.  These Care Teams include your primary Cardiologist (physician) and Advanced Practice Providers (APPs -  Physician Assistants and Nurse Practitioners) who all work together to provide you with the care you need, when you need it.  We recommend signing up for the patient portal called "MyChart".  Sign up information is provided on this After Visit Summary.  MyChart is used to connect with patients for Virtual Visits (Telemedicine).  Patients are able to view lab/test results, encounter notes, upcoming appointments, etc.  Non-urgent messages can be sent to your provider as well.   To learn more about what you can do with MyChart, go to [email or web address].    Your next appointment:   12 month(s)  The format for your next appointment:   In Person  Provider:   Candee Furbish, MD

## 2020-05-08 ENCOUNTER — Telehealth: Payer: Self-pay | Admitting: *Deleted

## 2020-05-08 NOTE — Telephone Encounter (Signed)
[facility] has moved to another location and patients supplies and headgear have been misplaced. Patient will visit the new location on [address] to be refitted and order her supplies.

## 2020-05-08 NOTE — Telephone Encounter (Signed)
-----   Message from Mady Haagensen, [place] sent at 05/07/2020 12:23 PM EDT ----- Regarding: CPAP supplies Patient is here today seeing Kathyrn Drown and states that she was supposed to get new supplies for mask fitting. She has already signed the paper work and sent it. She was following up on that to make sure it has been received or see if there is anything else that needs to be done.

## 2020-05-09 ENCOUNTER — Other Ambulatory Visit: Payer: Self-pay

## 2020-05-09 ENCOUNTER — Ambulatory Visit (INDEPENDENT_AMBULATORY_CARE_PROVIDER_SITE_OTHER): Payer: Medicare Other | Admitting: Family Medicine

## 2020-05-09 ENCOUNTER — Ambulatory Visit: Payer: Self-pay

## 2020-05-09 DIAGNOSIS — M170 Bilateral primary osteoarthritis of knee: Secondary | ICD-10-CM

## 2020-05-09 NOTE — Progress Notes (Signed)
Dawn Jordan - 54 y.o. female MRN [id number]  Date of birth: Jun 27, 1966  SUBJECTIVE:  Including CC & ROS.  No chief complaint on file.   Dawn Jordan is a 54 y.o. female that is presenting with exacerbation of bilateral knee pain.  She had received steroid injections with limited improvement.  She is here today for her gel injections.   Review of Systems See HPI   HISTORY: Past Medical, Surgical, Social, and Family History Reviewed & Updated per EMR.   Pertinent Historical Findings include:  Past Medical History:  Diagnosis Date  . A-fib ([place])   . ADD (attention deficit disorder)   . Anemia   . Anginal pain ([place])   . Anxiety   . Asthma   . Back pain   . Bone spur of foot   . Chest pain   . Chronic fatigue syndrome   . COPD (chronic obstructive pulmonary disease) ([place])   . Depression   . Dyspnea   . Fibromyalgia   . GERD (gastroesophageal reflux disease)   . Headache    "weekly-monthly" (06/04/2017)  . History of gout   . History of kidney stones   . Hypertension   . IBS (irritable bowel syndrome)   . Joint pain   . Left ankle pain   . Left sciatic nerve pain   . Myofascial pain   . Neck pain   . OSA (obstructive sleep apnea)    "getting ready to retest" (06/04/2017)  . Palpitations   . Rheumatoid arthritis ([place])   . Seasonal allergies     Past Surgical History:  Procedure Laterality Date  . CARPAL TUNNEL RELEASE Left   . CYST EXCISION     "little cysts taken off both hands and left arm" (06/04/2017)  . KNEE ARTHROSCOPY Bilateral    "3 on my left; 2 on my right" (06/04/2017)  . LAPAROSCOPIC CHOLECYSTECTOMY    . TONSILLECTOMY      Family History  Problem Relation Age of Onset  . Arthritis Mother   . Hyperlipidemia Mother   . Hypertension Mother   . Stroke Mother   . Mental retardation Mother   . Diabetes Mother   . Allergic rhinitis Mother   . Heart disease Mother   . Thyroid disease Mother   . Depression Mother   . Anxiety disorder Mother   .  Bipolar disorder Mother   . Sleep apnea Mother   . Eating disorder Mother   . Diabetes Maternal Grandfather   . Hypertension Maternal Grandfather   . Diabetes Paternal Grandmother   . Hypertension Paternal Grandmother   . Cancer Paternal Grandmother        breast  . Allergic rhinitis Brother   . Colon cancer Paternal Aunt   . Lung cancer Other   . Lung cancer Maternal Uncle   . Asthma Neg Hx   . Eczema Neg Hx   . Immunodeficiency Neg Hx   . Urticaria Neg Hx   . Atopy Neg Hx   . Angioedema Neg Hx   . Esophageal cancer Neg Hx   . Stomach cancer Neg Hx   . Rectal cancer Neg Hx     Social History   Socioeconomic History  . Marital status: Married    Spouse name: Eline Chaparro  . Number of children: 0  . Years of education: Not on file  . Highest education level: Not on file  Occupational History  . Occupation: not employed-disabled  Tobacco Use  . Smoking status: Never Smoker  .  Smokeless tobacco: Never Used  Substance and Sexual Activity  . Alcohol use: No  . Drug use: No  . Sexual activity: Never  Other Topics Concern  . Not on file  Social History Narrative   Born in [place], [place]   Unemployed, on disability since 1991 for anxiety,    Education: [employer]   Single, no children, lives with her friend (used to live with her mother with stroke, now in ALF)      Social Determinants of Health   Financial Resource Strain:   . Difficulty of Paying Living Expenses:   Food Insecurity:   . Worried About Charity fundraiser in the Last Year:   . Arboriculturist in the Last Year:   Transportation Needs:   . Film/video editor (Medical):   Marland Kitchen Lack of Transportation (Non-Medical):   Physical Activity:   . Days of Exercise per Week:   . Minutes of Exercise per Session:   Stress:   . Feeling of Stress :   Social Connections:   . Frequency of Communication with Friends and Family:   . Frequency of Social Gatherings with Friends and  Family:   . Attends Religious Services:   . Active Member of Clubs or Organizations:   . Attends Archivist Meetings:   Marland Kitchen Marital Status:   Intimate Partner Violence:   . Fear of Current or Ex-Partner:   . Emotionally Abused:   Marland Kitchen Physically Abused:   . Sexually Abused:      PHYSICAL EXAM:  VS: LMP  (LMP Unknown)  Physical Exam Gen: NAD, alert, cooperative with exam, well-appearing    Aspiration/Injection Procedure Note Mayda Houtman 03-05-1966  Procedure: Injection Indications: Right knee pain   Procedure Details Consent: Risks of procedure as well as the alternatives and risks of each were explained to the (patient/caregiver).  Consent for procedure obtained. Time Out: Verified patient identification, verified procedure, site/side was marked, verified correct patient position, special equipment/implants available, medications/allergies/relevent history reviewed, required imaging and test results available.  Performed.  The area was cleaned with iodine and alcohol swabs.    The right knee superior lateral suprapatellar pouch was injected using 4 cc's of 1% lidocaine with a 22 1 1/2" needle.  The syringe was switched and a 4 mL 22 mg/mL of monovisc was injected. Ultrasound was used. Images were obtained in  Long views showing the injection.    A sterile dressing was applied.  Patient did tolerate procedure well.   Aspiration/Injection Procedure Note Emerson Cisar [birth year]  Procedure: Injection Indications: Left knee pain   Procedure Details Consent: Risks of procedure as well as the alternatives and risks of each were explained to the (patient/caregiver).  Consent for procedure obtained. Time Out: Verified patient identification, verified procedure, site/side was marked, verified correct patient position, special equipment/implants available, medications/allergies/relevent history reviewed, required imaging and test results available.  Performed.  The area was  cleaned with iodine and alcohol swabs.    The left knee superior lateral suprapatellar pouch was injected using 4 cc's of 1% lidocaine with a 22 1 1/2" needle.  The syringe was switched and 4 mL 22 mg/mL of monovisc was injected. Ultrasound was used. Images were obtained in  Long views showing the injection.    A sterile dressing was applied.  Patient did tolerate procedure well.    ASSESSMENT & PLAN:   Primary osteoarthritis of both knees Acute worsening of pain. Has tried steroid injection with limited  improvement.  - b/l gel injections  - counseled on home exercise therapy and supportive care. -Could consider physical therapy.

## 2020-05-09 NOTE — Patient Instructions (Signed)
Good to see you Please try ice  It may take a few weeks before you notice an improvement from the gel injections   Please send me a message in MyChart with any questions or updates.  Please see me back in 4 weeks.   --Dr. Raeford Razor

## 2020-05-09 NOTE — Assessment & Plan Note (Signed)
Acute worsening of pain. Has tried steroid injection with limited improvement.  - b/l gel injections  - counseled on home exercise therapy and supportive care. -Could consider physical therapy.

## 2020-05-10 ENCOUNTER — Other Ambulatory Visit (INDEPENDENT_AMBULATORY_CARE_PROVIDER_SITE_OTHER): Payer: Self-pay | Admitting: Physician Assistant

## 2020-05-10 DIAGNOSIS — R7303 Prediabetes: Secondary | ICD-10-CM

## 2020-05-27 NOTE — Progress Notes (Signed)
Called pt and she stated that " i'm good I already took everything". No further action needed.

## 2020-05-31 ENCOUNTER — Ambulatory Visit: Payer: Medicare Other | Admitting: Family Medicine

## 2020-06-13 ENCOUNTER — Ambulatory Visit: Payer: Medicare Other | Admitting: Family Medicine

## 2020-06-17 ENCOUNTER — Other Ambulatory Visit (HOSPITAL_COMMUNITY): Payer: Self-pay | Admitting: Psychiatry

## 2020-06-17 DIAGNOSIS — F900 Attention-deficit hyperactivity disorder, predominantly inattentive type: Secondary | ICD-10-CM

## 2020-06-17 DIAGNOSIS — F331 Major depressive disorder, recurrent, moderate: Secondary | ICD-10-CM

## 2020-06-19 ENCOUNTER — Other Ambulatory Visit: Payer: Self-pay | Admitting: Cardiology

## 2020-06-19 MED ORDER — METOPROLOL SUCCINATE ER 25 MG PO TB24: 25.0000 mg | ORAL_TABLET | Freq: Every day | ORAL | 3 refills | Status: DC

## 2020-06-19 NOTE — Telephone Encounter (Signed)
OK to refill.  Originally started by Dr Marlou Porch based on 07/05/19 monitor results and was on most recent office visit medication list. Refill sent to [facility]

## 2020-06-19 NOTE — Telephone Encounter (Signed)
°*  STAT* If patient is at the pharmacy, call can be transferred to refill team.   1. Which medications need to be refilled? (please list name of each medication and dose if known) metoprolol succinate (TOPROL-XL) 25 MG 24 hr tablet  2. Which pharmacy/location (including street and city if local pharmacy) is medication to be sent to? [place], [place]  3. Do they need a 30 day or 90 day supply? 90 day supply

## 2020-06-19 NOTE — Telephone Encounter (Signed)
Pt requesting a refill on metoprolol. This medication has been refilled with PCP. Would Dr. Marlou Porch like to refill this medication? Please address

## 2020-06-24 DIAGNOSIS — M0609 Rheumatoid arthritis without rheumatoid factor, multiple sites: Secondary | ICD-10-CM | POA: Diagnosis not present

## 2020-07-04 ENCOUNTER — Telehealth (INDEPENDENT_AMBULATORY_CARE_PROVIDER_SITE_OTHER): Payer: Medicare Other | Admitting: Psychiatry

## 2020-07-04 ENCOUNTER — Other Ambulatory Visit: Payer: Self-pay

## 2020-07-04 DIAGNOSIS — F4310 Post-traumatic stress disorder, unspecified: Secondary | ICD-10-CM | POA: Diagnosis not present

## 2020-07-04 DIAGNOSIS — F900 Attention-deficit hyperactivity disorder, predominantly inattentive type: Secondary | ICD-10-CM

## 2020-07-04 DIAGNOSIS — F331 Major depressive disorder, recurrent, moderate: Secondary | ICD-10-CM | POA: Diagnosis not present

## 2020-07-04 NOTE — Progress Notes (Signed)
Virtual Visit via Telephone Note  I connected with Dawn Jordan on 07/04/20 at  1:30 PM EDT by telephone and verified that I am speaking with the correct person using two identifiers.  Location: Patient: home Provider: office   I discussed the limitations, risks, security and privacy concerns of performing an evaluation and management service by telephone and the availability of in person appointments. I also discussed with the patient that there may be a patient responsible charge related to this service. The patient expressed understanding and agreed to proceed.   History of Present Illness: Dawn Jordan has been dealing with a lot of stress lately. Her close friend passed away late last month. 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 Caring for the baby has helped to decrease her depression. She finds joy in it. Her PTSD is present but being busy helps to distract her from the intrusive memories. She denies SI/HI. Her concentration and focus are good.     Observations/Objective:  General Appearance: unable to assess  Eye Contact:  unable to assess  Speech:  Clear and Coherent and Normal Rate  Volume:  Normal  Mood:  Anxious and Depressed  Affect:  Full Range  Thought Process:  Coherent and Descriptions of Associations: Circumstantial  Orientation:  Full (Time, Place, and Person)  Thought Content:  Rumination  Suicidal Thoughts:  No  Homicidal Thoughts:  No  Memory:  Immediate;   Good  Judgement:  Good  Insight:  Good  Psychomotor Activity: unable to assess  Concentration:  Concentration: Good  Recall:  Good  Fund of Knowledge:  Good  Language:  Good  Akathisia:  unable to assess  Handed:  Right  AIMS (if indicated):     Assets:  Communication Skills Desire for Improvement Financial Resources/Insurance Housing Talents/Skills Transportation Vocational/Educational  ADL's:  unable to assess  Cognition:  WNL  Sleep:         Assessment and Plan:  MDD- recurrent,  moderate; PTSD; ADHD- inattentive type  Klonopin 0.5mg  po TID prn Viibryd 20mg  po qD Strattera 40mg  po BID    Follow Up Instructions: In 2-3 months or sooner if needed   I discussed the assessment and treatment plan with the patient. The patient was provided an opportunity to ask questions and all were answered. The patient agreed with the plan and demonstrated an understanding of the instructions.   The patient was advised to call back or seek an in-person evaluation if the symptoms worsen or if the condition fails to improve as anticipated.  I provided 20 minutes of non-face-to-face time during this encounter.   Charlcie Cradle, MD

## 2020-07-09 ENCOUNTER — Other Ambulatory Visit: Payer: Self-pay | Admitting: Cardiology

## 2020-07-15 ENCOUNTER — Other Ambulatory Visit (HOSPITAL_COMMUNITY): Payer: Self-pay | Admitting: Psychiatry

## 2020-07-15 DIAGNOSIS — M797 Fibromyalgia: Secondary | ICD-10-CM | POA: Diagnosis not present

## 2020-07-15 DIAGNOSIS — R7989 Other specified abnormal findings of blood chemistry: Secondary | ICD-10-CM | POA: Diagnosis not present

## 2020-07-15 DIAGNOSIS — M2550 Pain in unspecified joint: Secondary | ICD-10-CM | POA: Diagnosis not present

## 2020-07-15 DIAGNOSIS — Z79899 Other long term (current) drug therapy: Secondary | ICD-10-CM | POA: Diagnosis not present

## 2020-07-15 DIAGNOSIS — F4310 Post-traumatic stress disorder, unspecified: Secondary | ICD-10-CM

## 2020-07-15 DIAGNOSIS — Z6841 Body Mass Index (BMI) 40.0 and over, adult: Secondary | ICD-10-CM | POA: Diagnosis not present

## 2020-07-15 DIAGNOSIS — M0609 Rheumatoid arthritis without rheumatoid factor, multiple sites: Secondary | ICD-10-CM | POA: Diagnosis not present

## 2020-07-19 MED ORDER — VIIBRYD 20 MG PO TABS: 20.0000 mg | ORAL_TABLET | Freq: Every day | ORAL | 0 refills | Status: DC

## 2020-07-19 MED ORDER — ATOMOXETINE HCL 40 MG PO CAPS: 40.0000 mg | ORAL_CAPSULE | Freq: Two times a day (BID) | ORAL | 0 refills | Status: DC

## 2020-07-19 MED ORDER — CLONAZEPAM 0.5 MG PO TABS: 0.5000 mg | ORAL_TABLET | Freq: Three times a day (TID) | ORAL | 2 refills | Status: DC | PRN

## 2020-07-25 ENCOUNTER — Other Ambulatory Visit: Payer: Self-pay | Admitting: Family Medicine

## 2020-07-31 ENCOUNTER — Encounter: Payer: Medicare Other | Admitting: Physical Medicine and Rehabilitation

## 2020-08-15 ENCOUNTER — Other Ambulatory Visit: Payer: Self-pay | Admitting: Family Medicine

## 2020-08-15 ENCOUNTER — Other Ambulatory Visit: Payer: Self-pay | Admitting: *Deleted

## 2020-08-15 DIAGNOSIS — K219 Gastro-esophageal reflux disease without esophagitis: Secondary | ICD-10-CM

## 2020-08-15 MED ORDER — LEVOCETIRIZINE DIHYDROCHLORIDE 5 MG PO TABS: 5.0000 mg | ORAL_TABLET | Freq: Every evening | ORAL | 0 refills | Status: DC

## 2020-08-19 ENCOUNTER — Other Ambulatory Visit: Payer: Self-pay | Admitting: Family Medicine

## 2020-08-22 DIAGNOSIS — M170 Bilateral primary osteoarthritis of knee: Secondary | ICD-10-CM | POA: Diagnosis not present

## 2020-08-22 DIAGNOSIS — M25562 Pain in left knee: Secondary | ICD-10-CM | POA: Diagnosis not present

## 2020-08-22 DIAGNOSIS — M25561 Pain in right knee: Secondary | ICD-10-CM | POA: Diagnosis not present

## 2020-08-25 DIAGNOSIS — Z20828 Contact with and (suspected) exposure to other viral communicable diseases: Secondary | ICD-10-CM | POA: Diagnosis not present

## 2020-08-29 ENCOUNTER — Other Ambulatory Visit: Payer: Self-pay

## 2020-08-29 ENCOUNTER — Telehealth (INDEPENDENT_AMBULATORY_CARE_PROVIDER_SITE_OTHER): Payer: Medicare Other | Admitting: Psychiatry

## 2020-08-29 DIAGNOSIS — F4310 Post-traumatic stress disorder, unspecified: Secondary | ICD-10-CM | POA: Diagnosis not present

## 2020-08-29 DIAGNOSIS — F900 Attention-deficit hyperactivity disorder, predominantly inattentive type: Secondary | ICD-10-CM | POA: Diagnosis not present

## 2020-08-29 DIAGNOSIS — F331 Major depressive disorder, recurrent, moderate: Secondary | ICD-10-CM

## 2020-08-29 MED ORDER — METHYLPHENIDATE HCL 20 MG PO TABS: 20.0000 mg | ORAL_TABLET | Freq: Two times a day (BID) | ORAL | 0 refills | Status: DC

## 2020-08-29 MED ORDER — VIIBRYD 20 MG PO TABS: 20.0000 mg | ORAL_TABLET | Freq: Every day | ORAL | 0 refills | Status: DC

## 2020-08-29 MED ORDER — CLONAZEPAM 0.5 MG PO TABS: 0.5000 mg | ORAL_TABLET | Freq: Three times a day (TID) | ORAL | 2 refills | Status: DC | PRN

## 2020-08-29 NOTE — Progress Notes (Signed)
Virtual Visit via Telephone Note  I connected with Dawn Jordan on 08/29/20 at  2:45 PM EDT by telephone and verified that I am speaking with the correct person using two identifiers.  Location: Patient: home Provider: office   I discussed the limitations, risks, security and privacy concerns of performing an evaluation and management service by telephone and the availability of in person appointments. I also discussed with the patient that there may be a patient responsible charge related to this service. The patient expressed understanding and agreed to proceed.   History of Present Illness: Dawn Jordan shares that Strattera is no longer helping. She is unable to stay goal oriented. She is moving from task to unfinished task. Dawn Jordan has been feeling depressed. The fact that a on old friend passed away recently. Dawn Jordan denies SI/HI. Despite having anxiety she only takes Klonopin a couple of times a month when coping skills do not work.    Observations/Objective:  General Appearance: unable to assess  Eye Contact:  unable to assess  Speech:  Clear and Coherent and Normal Rate  Volume:  Normal  Mood:  Depressed  Affect:  Congruent  Thought Process:  Coherent and Descriptions of Associations: Circumstantial  Orientation:  Full (Time, Place, and Person)  Thought Content:  Logical  Suicidal Thoughts:  No  Homicidal Thoughts:  No  Memory:  Immediate;   Good  Judgement:  Good  Insight:  Good  Psychomotor Activity: unable to assess  Concentration:  Concentration: Good  Recall:  Good  Fund of Knowledge:  Good  Language:  Good  Akathisia:  unable to assess  Handed:  Right  AIMS (if indicated):     Assets:  Communication Skills Desire for Improvement Financial Resources/Insurance Housing Resilience Social Support Talents/Skills Transportation Vocational/Educational  ADL's:  unable to assess  Cognition:  WNL  Sleep:         Assessment and Plan: ADHD-inattentive type; MDD- recurrent,  moderate;  Other dx: PTSD  D/c Strattera Restart Ritalin 20mg  po BID Klonopin 0.5mg  po TID prn Viibryd 20mg  po qD    Follow Up Instructions: In 3 months or sooner if needed   I discussed the assessment and treatment plan with the patient. The patient was provided an opportunity to ask questions and all were answered. The patient agreed with the plan and demonstrated an understanding of the instructions.   The patient was advised to call back or seek an in-person evaluation if the symptoms worsen or if the condition fails to improve as anticipated.  I provided 5minutes of non-face-to-face time during this encounter.   Charlcie Cradle, MD

## 2020-09-06 DIAGNOSIS — M170 Bilateral primary osteoarthritis of knee: Secondary | ICD-10-CM | POA: Diagnosis not present

## 2020-09-06 DIAGNOSIS — M1712 Unilateral primary osteoarthritis, left knee: Secondary | ICD-10-CM | POA: Diagnosis not present

## 2020-09-06 DIAGNOSIS — M1711 Unilateral primary osteoarthritis, right knee: Secondary | ICD-10-CM | POA: Diagnosis not present

## 2020-10-01 ENCOUNTER — Other Ambulatory Visit: Payer: Self-pay | Admitting: Family Medicine

## 2020-10-01 DIAGNOSIS — K219 Gastro-esophageal reflux disease without esophagitis: Secondary | ICD-10-CM

## 2020-10-07 ENCOUNTER — Telehealth: Payer: Self-pay

## 2020-10-07 NOTE — Telephone Encounter (Signed)
Patient called in thinking she may have a sinus infection. Wanting to know if provider can send something into the pharmacy for her     Please call and advise    [id number] - [place], [place] - [address]. AT [address]

## 2020-10-07 NOTE — Telephone Encounter (Signed)
Pt needs appt, can be virtual & added on tomorrow.

## 2020-10-08 ENCOUNTER — Encounter: Payer: Self-pay | Admitting: Family Medicine

## 2020-10-08 ENCOUNTER — Telehealth (INDEPENDENT_AMBULATORY_CARE_PROVIDER_SITE_OTHER): Payer: Medicare Other | Admitting: Family Medicine

## 2020-10-08 VITALS — Ht 64.0 in

## 2020-10-08 DIAGNOSIS — H9203 Otalgia, bilateral: Secondary | ICD-10-CM | POA: Diagnosis not present

## 2020-10-08 DIAGNOSIS — J302 Other seasonal allergic rhinitis: Secondary | ICD-10-CM

## 2020-10-08 DIAGNOSIS — J3089 Other allergic rhinitis: Secondary | ICD-10-CM | POA: Diagnosis not present

## 2020-10-08 DIAGNOSIS — R2231 Localized swelling, mass and lump, right upper limb: Secondary | ICD-10-CM

## 2020-10-08 MED ORDER — PREDNISONE 20 MG PO TABS: 40.0000 mg | ORAL_TABLET | Freq: Every day | ORAL | 0 refills | Status: AC

## 2020-10-08 NOTE — Progress Notes (Signed)
Virtual Visit via Video Note   I connected with Dawn Jordan on 10/08/20 by a video enabled telemedicine application and verified that I am speaking with the correct person using two identifiers.  Location patient: home Location provider:work office Persons participating in the virtual visit: patient, provider  I discussed the limitations of evaluation and management by telemedicine and the availability of in person appointments. The patient expressed understanding and agreed to proceed.   HPI: Dawn. Jordan is a 54 year old female with history of allergies, RA, PTSD, fibromyalgia, and GERD complaining of 10 to 14 days of intermittent bilateral ear pressure, "little swelling sinuses","faint" frontal pressure headache, rhinorrhea, and nasal congestion. She has not had fever, chills, change in appetite, or unusual fatigue. Negative for earache, drainage, or hearing changes.  Rhinorrhea was initially green, now it is clear. Nasal congestion is worse at night.  No ageusia or anosmia. No sick contact.  Negative for associated sore throat, stridor, cough, dyspnea, wheezing, abdominal pain, N/V, or a skin rash.  She is on Xyzal 5 mg daily, Singulair 10 mg daily, and Flonase nasal spray. She try to change Xyzal for Claritin but it did not help.  She has had similar symptoms, "allergic reaction" in the past when exposed to latex, plastic, or carpet. No known exposure.  She is also complaining about mass in right arm that she noted initially 3 to 4 years ago, it is growing. Negative for associated skin changes or unusual pain. She would like to have this removed.  ROS: See pertinent positives and negatives per HPI.  Past Medical History:  Diagnosis Date  . A-fib ([place])   . ADD (attention deficit disorder)   . Anemia   . Anginal pain ([place])   . Anxiety   . Asthma   . Back pain   . Bone spur of foot   . Chest pain   . Chronic fatigue syndrome   . COPD (chronic obstructive pulmonary disease)  ([place])   . Depression   . Dyspnea   . Fibromyalgia   . GERD (gastroesophageal reflux disease)   . Headache    "weekly-monthly" (06/04/2017)  . History of gout   . History of kidney stones   . Hypertension   . IBS (irritable bowel syndrome)   . Joint pain   . Left ankle pain   . Left sciatic nerve pain   . Myofascial pain   . Neck pain   . OSA (obstructive sleep apnea)    "getting ready to retest" (06/04/2017)  . Palpitations   . Rheumatoid arthritis ([place])   . Seasonal allergies     Past Surgical History:  Procedure Laterality Date  . CARPAL TUNNEL RELEASE Left   . CYST EXCISION     "little cysts taken off both hands and left arm" (06/04/2017)  . KNEE ARTHROSCOPY Bilateral    "3 on my left; 2 on my right" (06/04/2017)  . LAPAROSCOPIC CHOLECYSTECTOMY    . TONSILLECTOMY      Family History  Problem Relation Age of Onset  . Arthritis Mother   . Hyperlipidemia Mother   . Hypertension Mother   . Stroke Mother   . Mental retardation Mother   . Diabetes Mother   . Allergic rhinitis Mother   . Heart disease Mother   . Thyroid disease Mother   . Depression Mother   . Anxiety disorder Mother   . Bipolar disorder Mother   . Sleep apnea Mother   . Eating disorder Mother   . Diabetes  Maternal Grandfather   . Hypertension Maternal Grandfather   . Diabetes Paternal Grandmother   . Hypertension Paternal Grandmother   . Cancer Paternal Grandmother        breast  . Allergic rhinitis Brother   . Colon cancer Paternal Aunt   . Lung cancer Other   . Lung cancer Maternal Uncle   . Asthma Neg Hx   . Eczema Neg Hx   . Immunodeficiency Neg Hx   . Urticaria Neg Hx   . Atopy Neg Hx   . Angioedema Neg Hx   . Esophageal cancer Neg Hx   . Stomach cancer Neg Hx   . Rectal cancer Neg Hx     Social History   Socioeconomic History  . Marital status: Married    Spouse name: Lakin Rhine  . Number of children: 0  . Years of education: Not on file  . Highest education level:  Not on file  Occupational History  . Occupation: not employed-disabled  Tobacco Use  . Smoking status: Never Smoker  . Smokeless tobacco: Never Used  Vaping Use  . Vaping Use: Never used  Substance and Sexual Activity  . Alcohol use: No  . Drug use: No  . Sexual activity: Never  Other Topics Concern  . Not on file  Social History Narrative   Born in [place], [place]"   Unemployed, on disability since 1991 for anxiety,    Education: [employer]   Single, no children, lives with her friend (used to live with her mother with stroke, now in ALF)      Social Determinants of Health   Financial Resource Strain:   . Difficulty of Paying Living Expenses: Not on file  Food Insecurity:   . Worried About Charity fundraiser in the Last Year: Not on file  . Ran Out of Food in the Last Year: Not on file  Transportation Needs:   . Lack of Transportation (Medical): Not on file  . Lack of Transportation (Non-Medical): Not on file  Physical Activity:   . Days of Exercise per Week: Not on file  . Minutes of Exercise per Session: Not on file  Stress:   . Feeling of Stress : Not on file  Social Connections:   . Frequency of Communication with Friends and Family: Not on file  . Frequency of Social Gatherings with Friends and Family: Not on file  . Attends Religious Services: Not on file  . Active Member of Clubs or Organizations: Not on file  . Attends Archivist Meetings: Not on file  . Marital Status: Not on file  Intimate Partner Violence:   . Fear of Current or Ex-Partner: Not on file  . Emotionally Abused: Not on file  . Physically Abused: Not on file  . Sexually Abused: Not on file    Current Outpatient Medications:  .  ACTEMRA 162 MG/0.9ML SOSY, every 7 (seven) days. , Disp: , Rfl:  .  aspirin EC 81 MG tablet, Take 1 tablet (81 mg total) by mouth daily., Disp: 90 tablet, Rfl: 3 .  atomoxetine (STRATTERA) 40 MG capsule, Take 40 mg by mouth 2 (two)  times daily., Disp: , Rfl:  .  clonazePAM (KLONOPIN) 0.5 MG tablet, Take 1 tablet (0.5 mg total) by mouth 3 (three) times daily as needed for anxiety., Disp: 90 tablet, Rfl: 2 .  EPINEPHrine (EPIPEN 2-PAK) 0.3 mg/0.3 mL IJ SOAJ injection, Use for life threatening allergic reactions, Disp: 2 each, Rfl: 1 .  flecainide (TAMBOCOR) 50 MG tablet, TAKE 1 TABLET BY MOUTH TWICE A DAY, Disp: 180 tablet, Rfl: 3 .  fluticasone (FLONASE) 50 MCG/ACT nasal spray, Place 1 spray into both nostrils daily., Disp: 16 g, Rfl: 5 .  fluticasone (FLOVENT HFA) 220 MCG/ACT inhaler, Inhale 2 puffs into the lungs in the morning and at bedtime., Disp: 12 g, Rfl: 5 .  HYDROcodone-acetaminophen (NORCO/VICODIN) 5-325 MG tablet, Take 1 tablet by mouth every 6 (six) hours as needed for moderate pain., Disp: , Rfl:  .  levalbuterol (XOPENEX HFA) 45 MCG/ACT inhaler, Inhale 2 puffs into the lungs every 6 (six) hours as needed for wheezing., Disp: 1 Inhaler, Rfl: 5 .  levocetirizine (XYZAL) 5 MG tablet, Take 1 tablet (5 mg total) by mouth every evening., Disp: 30 tablet, Rfl: 0 .  methylphenidate (RITALIN) 20 MG tablet, Take 1 tablet (20 mg total) by mouth 2 (two) times daily with breakfast and lunch., Disp: 60 tablet, Rfl: 0 .  metoprolol succinate (TOPROL-XL) 25 MG 24 hr tablet, Take 1 tablet (25 mg total) by mouth daily., Disp: 90 tablet, Rfl: 3 .  montelukast (SINGULAIR) 10 MG tablet, TAKE 1 TABLET BY MOUTH EVERYDAY AT BEDTIME, Disp: 30 tablet, Rfl: 0 .  nabumetone (RELAFEN) 500 MG tablet, Take 500 mg by mouth 3 (three) times daily., Disp: , Rfl: 3 .  nitroGLYCERIN (NITROSTAT) 0.4 MG SL tablet, Place 1 tablet (0.4 mg total) under the tongue every 5 (five) minutes as needed for chest pain., Disp: 25 tablet, Rfl: prn .  Olopatadine HCl 0.2 % SOLN, Place 1 drop into both eyes daily as needed., Disp: 2.5 mL, Rfl: 5 .  omeprazole (PRILOSEC) 40 MG capsule, TAKE 1 CAPSULE BY MOUTH EVERY DAY 30 MINUTES BEFORE BREAKFAST, Disp: 90 capsule,  Rfl: 2 .  pregabalin (LYRICA) 200 MG capsule, Take 200 mg by mouth 3 (three) times daily. , Disp: , Rfl:  .  tiZANidine (ZANAFLEX) 4 MG tablet, TAKE 1 TABLET (4 MG TOTAL) BY MOUTH EVERY 12 (TWELVE) HOURS AS NEEDED FOR MUSCLE SPASMS., Disp: 180 tablet, Rfl: 1 .  triamcinolone ointment (KENALOG) 0.1 %, Apply 1 application topically 2 (two) times daily., Disp: 60 g, Rfl: 5 .  Vilazodone HCl (VIIBRYD) 20 MG TABS, Take 1 tablet (20 mg total) by mouth daily., Disp: 90 tablet, Rfl: 0 .  predniSONE (DELTASONE) 20 MG tablet, Take 2 tablets (40 mg total) by mouth daily with breakfast for 5 days., Disp: 10 tablet, Rfl: 0  Current Facility-Administered Medications:  .  0.9 %  sodium chloride infusion, 500 mL, Intravenous, Continuous, Milus Banister, MD  EXAM:  VITALS per patient if applicable:Ht 5\' 4"  (1.626 m)   LMP  (LMP Unknown)   BMI 41.99 kg/m   GENERAL: alert, oriented, appears well and in no acute distress  HEENT: atraumatic, conjunctiva clear, no obvious abnormalities on inspection of external nose and ears. Pain is not elicited with pulling ears or pressing tragus. There is no pain upon pressing maxillary sinuses, "little" pressure sensation when pressing frontal sinuses.  NECK: normal movements of the head and neck  LUNGS: on inspection no signs of respiratory distress, breathing rate appears normal, no obvious gross SOB, gasping or wheezing  CV: no obvious cyanosis  Dawn: moves all visible extremities without noticeable abnormality. Hard to evaluate but it is seems to be a nodular lesion ain right arm,above medial epicondyle. It is  mobile.It is about 2 inches.I do not appreciate skin changes or limitation of elbow ROM.  PSYCH/NEURO: pleasant  and cooperative, no obvious depression or anxiety, speech and thought processing grossly intact  ASSESSMENT AND PLAN:  Discussed the following assessment and plan:  Arm mass, right - Plan: Ambulatory referral to General Surgery ?   Lipoma. Appointment with surgeon will be arranged.  Seasonal and perennial allergic rhinitis - Plan: predniSONE (DELTASONE) 20 MG tablet I do not think symptoms suggest bacterial sinusitis, so for now I am not recommending antibiotic treatment. She has taken prednisone in the past, she has tolerated well in general. We discussed some side effects of prednisone. She agrees with prednisone 20 mg for 3 to 5 days, instructed to take medication with breakfast. Recommend stopping Xyzal for a couple weeks. Continue Singulair 10 mg daily and Flonase nasal spray daily. Nasal saline irrigations as needed. Plain Mucinex may also help. If there is not any improvement in 7 to 10 days, she was instructed to let me know.  Discomfort of both ears ? Eustachian tube dysfunction. Continue monitoring for new symptoms. We may need to consider ENT evaluation if problem is persistent.  I discussed the assessment and treatment plan with the patient. Dawn Jordan was provided an opportunity to ask questions and all were answered. She agreed with the plan and demonstrated an understanding of the instructions.   Return if symptoms worsen or fail to improve.  Hunter Pinkard [place], MD

## 2020-10-19 ENCOUNTER — Other Ambulatory Visit: Payer: Self-pay | Admitting: Family Medicine

## 2020-10-25 DIAGNOSIS — Z79899 Other long term (current) drug therapy: Secondary | ICD-10-CM | POA: Diagnosis not present

## 2020-10-25 DIAGNOSIS — M2550 Pain in unspecified joint: Secondary | ICD-10-CM | POA: Diagnosis not present

## 2020-10-25 DIAGNOSIS — M797 Fibromyalgia: Secondary | ICD-10-CM | POA: Diagnosis not present

## 2020-10-25 DIAGNOSIS — Z6841 Body Mass Index (BMI) 40.0 and over, adult: Secondary | ICD-10-CM | POA: Diagnosis not present

## 2020-10-25 DIAGNOSIS — R7989 Other specified abnormal findings of blood chemistry: Secondary | ICD-10-CM | POA: Diagnosis not present

## 2020-10-25 DIAGNOSIS — M0609 Rheumatoid arthritis without rheumatoid factor, multiple sites: Secondary | ICD-10-CM | POA: Diagnosis not present

## 2020-11-05 ENCOUNTER — Telehealth: Payer: Self-pay | Admitting: Cardiology

## 2020-11-05 NOTE — Telephone Encounter (Signed)
Reviewed this information with Dr Marlou Porch who states to schedule this pt with him or one of the apps for f/u.  Attempted to call pt to schedule, phone rang once then went to VM.  I did not leave a message.  Will continue to c/b to schedule.

## 2020-11-05 NOTE — Telephone Encounter (Signed)
Spoke with patient regarding her symptoms of chest pain and SOB. She states that her symptoms began approximately 3 weeks ago at which time she notes anterior, left sided chest pain with no associated diaphoresis, N/V or pain radiation. She states that she woke this morning around 3 am with symptoms that were concerning to her therefore prompting her call. There are no exertional qualities. She did note that her HR on her watch was elevated at 135bpm. She follows with Dr. Marlou Porch for AF and has been controlled on Flecainide. She underwent stress testing, echocardiogram and cCTA, all of which were normal with no signs of CAD. Coronary calcium score was 0 last year. She states that she has been out of her Lyrica for approximately 3 weeks and restarted 2 days ago. She also reports non-compliance with her CPAP. She is currently waiting on a new mask as she could not tolerate a nasal mask. I am reassured that her symptoms are not CAD in etiology however given her hx of AF and intermittently elevated rates, would recommend closer in person follow up and monitoring of symptoms. She wishes to contact our office for closer follow up. ED precautions reviewed. Pt understands and agrees.    Kathyrn Drown NP-C [place] Pager: [phone]

## 2020-11-05 NOTE — Telephone Encounter (Signed)
With her coronary CT scan reassuring as well as other work-up, no further cardiac work-up necessary at this time.   Our team would be happy to see her in follow-up.  Candee Furbish, MD

## 2020-11-05 NOTE — Telephone Encounter (Signed)
Pt c/o of Chest Pain: STAT if CP now or developed within 24 hours  1. Are you having CP right now? No   2. Are you experiencing any other symptoms (ex. SOB, nausea, vomiting, sweating)? Left neck/left arm pain, headaches, SOB  3. How long have you been experiencing CP? 1 week  4. Is your CP continuous or coming and going? Coming and going   5. Have you taken Nitroglycerin? Yes    Pt c/o Shortness Of Breath: STAT if SOB developed within the last 24 hours or pt is noticeably SOB on the phone  1. Are you currently SOB (can you hear that pt is SOB on the phone)? No  2. How long have you been experiencing SOB? About 2 weeks  3. Are you SOB when sitting or when up moving around? When up and moving around  4. Are you currently experiencing any other symptoms? No    ?

## 2020-11-07 DIAGNOSIS — M170 Bilateral primary osteoarthritis of knee: Secondary | ICD-10-CM | POA: Diagnosis not present

## 2020-11-11 DIAGNOSIS — D1721 Benign lipomatous neoplasm of skin and subcutaneous tissue of right arm: Secondary | ICD-10-CM | POA: Diagnosis not present

## 2020-11-11 NOTE — Telephone Encounter (Signed)
Attempted again to reach pt to schedule appt for f/u of recent CP and SOB.  Lm to cb to schedule.

## 2020-11-14 ENCOUNTER — Other Ambulatory Visit (HOSPITAL_COMMUNITY): Payer: Self-pay | Admitting: Psychiatry

## 2020-11-20 NOTE — Telephone Encounter (Signed)
Pt has not called back to schedule appt with Dr Marlou Porch for eval of chest pain.  Will close this encounter and await a return call to schedule.

## 2020-11-22 DIAGNOSIS — G894 Chronic pain syndrome: Secondary | ICD-10-CM | POA: Diagnosis not present

## 2020-11-28 NOTE — Progress Notes (Unsigned)
Subjective:   Stasha Naraine is a 54 y.o. female who presents for an Initial Medicare Annual Wellness Visit.  Review of Systems    N/A        Objective:    There were no vitals filed for this visit. There is no height or weight on file to calculate BMI.  Advanced Directives 10/21/2019 10/30/2018 06/04/2017 06/04/2017 12/06/2014  Does Patient Have a Medical Advance Directive? No No No No No  Would patient like information on creating a medical advance directive? No - Patient declined No - Patient declined Yes (Inpatient - patient defers creating a medical advance directive at this time) - Yes - Educational materials given    Current Medications (verified) Outpatient Encounter Medications as of 12/09/2020  Medication Sig   ACTEMRA 162 MG/0.9ML SOSY every 7 (seven) days.    aspirin EC 81 MG tablet Take 1 tablet (81 mg total) by mouth daily.   atomoxetine (STRATTERA) 40 MG capsule Take 40 mg by mouth 2 (two) times daily.   clonazePAM (KLONOPIN) 0.5 MG tablet Take 1 tablet (0.5 mg total) by mouth 3 (three) times daily as needed for anxiety.   EPINEPHrine (EPIPEN 2-PAK) 0.3 mg/0.3 mL IJ SOAJ injection Use for life threatening allergic reactions   flecainide (TAMBOCOR) 50 MG tablet TAKE 1 TABLET BY MOUTH TWICE A DAY   FLOVENT HFA 220 MCG/ACT inhaler INHALE TWO PUFFS INTO THE LUNGS EVERY MORNING & AT BEDTIME   fluticasone (FLONASE) 50 MCG/ACT nasal spray Place 1 spray into both nostrils daily.   HYDROcodone-acetaminophen (NORCO/VICODIN) 5-325 MG tablet Take 1 tablet by mouth every 6 (six) hours as needed for moderate pain.   levalbuterol (XOPENEX HFA) 45 MCG/ACT inhaler Inhale 2 puffs into the lungs every 6 (six) hours as needed for wheezing.   levocetirizine (XYZAL) 5 MG tablet Take 1 tablet (5 mg total) by mouth every evening.   methylphenidate (RITALIN) 20 MG tablet Take 1 tablet (20 mg total) by mouth 2 (two) times daily with breakfast and lunch.   metoprolol succinate  (TOPROL-XL) 25 MG 24 hr tablet Take 1 tablet (25 mg total) by mouth daily.   montelukast (SINGULAIR) 10 MG tablet TAKE 1 TABLET BY MOUTH EVERYDAY AT BEDTIME   nabumetone (RELAFEN) 500 MG tablet Take 500 mg by mouth 3 (three) times daily.   nitroGLYCERIN (NITROSTAT) 0.4 MG SL tablet Place 1 tablet (0.4 mg total) under the tongue every 5 (five) minutes as needed for chest pain.   Olopatadine HCl 0.2 % SOLN Place 1 drop into both eyes daily as needed.   omeprazole (PRILOSEC) 40 MG capsule TAKE 1 CAPSULE BY MOUTH EVERY DAY 30 MINUTES BEFORE BREAKFAST   pregabalin (LYRICA) 200 MG capsule Take 200 mg by mouth 3 (three) times daily.    tiZANidine (ZANAFLEX) 4 MG tablet TAKE 1 TABLET (4 MG TOTAL) BY MOUTH EVERY 12 (TWELVE) HOURS AS NEEDED FOR MUSCLE SPASMS.   triamcinolone ointment (KENALOG) 0.1 % Apply 1 application topically 2 (two) times daily.   Vilazodone HCl (VIIBRYD) 20 MG TABS Take 1 tablet (20 mg total) by mouth daily.   Facility-Administered Encounter Medications as of 12/09/2020  Medication   0.9 %  sodium chloride infusion    Allergies (verified) Eggs or egg-derived products, Latex, Other, and Codeine   History: Past Medical History:  Diagnosis Date   A-fib ([place])    ADD (attention deficit disorder)    Anemia    Anginal pain (HCC)    Anxiety    Asthma  Back pain    Bone spur of foot    Chest pain    Chronic fatigue syndrome    COPD (chronic obstructive pulmonary disease) (HCC)    Depression    Dyspnea    Fibromyalgia    GERD (gastroesophageal reflux disease)    Headache    "weekly-monthly" (06/04/2017)   History of gout    History of kidney stones    Hypertension    IBS (irritable bowel syndrome)    Joint pain    Left ankle pain    Left sciatic nerve pain    Myofascial pain    Neck pain    OSA (obstructive sleep apnea)    "getting ready to retest" (06/04/2017)   Palpitations    Rheumatoid arthritis (HCC)    Seasonal allergies     Past Surgical History:  Procedure Laterality Date   CARPAL TUNNEL RELEASE Left    CYST EXCISION     "little cysts taken off both hands and left arm" (06/04/2017)   KNEE ARTHROSCOPY Bilateral    "3 on my left; 2 on my right" (06/04/2017)   LAPAROSCOPIC CHOLECYSTECTOMY     TONSILLECTOMY     Family History  Problem Relation Age of Onset   Arthritis Mother    Hyperlipidemia Mother    Hypertension Mother    Stroke Mother    Mental retardation Mother    Diabetes Mother    Allergic rhinitis Mother    Heart disease Mother    Thyroid disease Mother    Depression Mother    Anxiety disorder Mother    Bipolar disorder Mother    Sleep apnea Mother    Eating disorder Mother    Diabetes Maternal Grandfather    Hypertension Maternal Grandfather    Diabetes Paternal Grandmother    Hypertension Paternal Grandmother    Cancer Paternal Grandmother        breast   Allergic rhinitis Brother    Colon cancer Paternal Aunt    Lung cancer Other    Lung cancer Maternal Uncle    Asthma Neg Hx    Eczema Neg Hx    Immunodeficiency Neg Hx    Urticaria Neg Hx    Atopy Neg Hx    Angioedema Neg Hx    Esophageal cancer Neg Hx    Stomach cancer Neg Hx    Rectal cancer Neg Hx    Social History   Socioeconomic History   Marital status: Married    Spouse name: Deyana Wnuk   Number of children: 0   Years of education: Not on file   Highest education level: Not on file  Occupational History   Occupation: not employed-disabled  Tobacco Use   Smoking status: Never Smoker   Smokeless tobacco: Never Used  Vaping Use   Vaping Use: Never used  Substance and Sexual Activity   Alcohol use: No   Drug use: No   Sexual activity: Never  Other Topics Concern   Not on file  Social History Narrative   Born in [place], [place]   Unemployed, on disability since 1991 for anxiety,    Education: Tree surgeon   Single, no  children, lives with her friend (used to live with her mother with stroke, now in ALF)      Social Determinants of Health   Financial Resource Strain: Not on file  Food Insecurity: Not on file  Transportation Needs: Not on file  Physical Activity: Not on file  Stress: Not on  file  Social Connections: Not on file    Tobacco Counseling Counseling given: Not Answered   Clinical Intake:                 Diabetic?No          Activities of Daily Living No flowsheet data found.  Patient Care Team: Martinique, Betty G, MD as PCP - General (Family Medicine) Jerline Pain, MD as PCP - Cardiology (Cardiology) Gaynelle Arabian, MD as Consulting Physician (Orthopedic Surgery)  Indicate any recent Medical Services you may have received from other than [facility] providers in the past year (date may be approximate).     Assessment:   This is a routine wellness examination for Nguyet.  Hearing/Vision screen No exam data present  Dietary issues and exercise activities discussed:    Goals   None    Depression Screen PHQ 2/9 Scores 12/26/2019 09/18/2019  PHQ - 2 Score 6 2  PHQ- 9 Score 26 -    Fall Risk Fall Risk  09/18/2019 08/31/2019  Falls in the past year? 1 1  Number falls in past yr: 1 0  Comment no recent fall -  Injury with Fall? 0 1  Risk for fall due to : History of fall(s);Impaired balance/gait Impaired mobility;Impaired balance/gait  Follow up - Falls prevention discussed    FALL RISK PREVENTION PERTAINING TO THE HOME:  Any stairs in or around the home? {YES/NO:21197} If so, are there any without handrails? No  Home free of loose throw rugs in walkways, pet beds, electrical cords, etc? Yes  Adequate lighting in your home to reduce risk of falls? Yes   ASSISTIVE DEVICES UTILIZED TO PREVENT FALLS:  Life alert? {YES/NO:21197} Use of a cane, walker or w/c? {YES/NO:21197} Grab bars in the bathroom? {YES/NO:21197} Shower chair or bench in shower?  {YES/NO:21197} Elevated toilet seat or a handicapped toilet? {YES/NO:21197}  TIMED UP AND GO:  Was the test performed? {YES/NO:21197}.  Length of time to ambulate 10 feet: *** sec.   {Appearance of ZOXW:9604540}  Cognitive Function:        Immunizations Immunization History  Administered Date(s) Administered   Td 12/08/2015    TDAP status: Up to date  {Flu Vaccine status:2101806}  {Pneumococcal vaccine status:2101807}  {Covid-19 vaccine status:2101808}  Qualifies for Shingles Vaccine? Yes   Zostavax completed No   Shingrix Completed?: No.    Education has been provided regarding the importance of this vaccine. Patient has been advised to call insurance company to determine out of pocket expense if they have not yet received this vaccine. Advised may also receive vaccine at local pharmacy or Health Dept. Verbalized acceptance and understanding.  Screening Tests Health Maintenance  Topic Date Due   COVID-19 Vaccine (1) Never done   PAP SMEAR-Modifier  Never done   MAMMOGRAM  04/14/2020   INFLUENZA VACCINE  02/05/2029 (Originally 07/07/2020)   TETANUS/TDAP  12/07/2025   COLONOSCOPY  08/05/2027   Hepatitis C Screening  Completed   HIV Screening  Completed    Health Maintenance  Health Maintenance Due  Topic Date Due   COVID-19 Vaccine (1) Never done   PAP SMEAR-Modifier  Never done   MAMMOGRAM  04/14/2020    Colorectal cancer screening: Type of screening: Colonoscopy. Completed 08/04/2017. Repeat every 10 years  Mammogram status: Ordered 12/09/2020. Pt provided with contact info and advised to call to schedule appt.   Bone Density Status: Not required at this age   Lung Cancer Screening: (Low Dose CT Chest recommended if  Age 81-80 years, 30 pack-year currently smoking OR have quit w/in 15years.) does not qualify.   Lung Cancer Screening Referral: N/A   Additional Screening:  Hepatitis C Screening: does qualify; Completed 04/16/2020  Vision  Screening: Recommended annual ophthalmology exams for early detection of glaucoma and other disorders of the eye. Is the patient up to date with their annual eye exam?  {YES/NO:21197} Who is the provider or what is the name of the office in which the patient attends annual eye exams? *** If pt is not established with a provider, would they like to be referred to a provider to establish care? {YES/NO:21197}.   Dental Screening: Recommended annual dental exams for proper oral hygiene  Community Resource Referral / Chronic Care Management: CRR required this visit?  No   CCM required this visit?  No      Plan:     I have personally reviewed and noted the following in the patients chart:    Medical and social history  Use of alcohol, tobacco or illicit drugs   Current medications and supplements  Functional ability and status  Nutritional status  Physical activity  Advanced directives  List of other physicians  Hospitalizations, surgeries, and ER visits in previous 12 months  Vitals  Screenings to include cognitive, depression, and falls  Referrals and appointments  In addition, I have reviewed and discussed with patient certain preventive protocols, quality metrics, and best practice recommendations. A written personalized care plan for preventive services as well as general preventive health recommendations were provided to patient.     Theodora Blow, LPN   [id number]   Nurse Notes: None

## 2020-12-09 ENCOUNTER — Ambulatory Visit: Payer: Medicare Other

## 2020-12-12 ENCOUNTER — Telehealth (HOSPITAL_COMMUNITY): Payer: Medicare Other | Admitting: Psychiatry

## 2020-12-12 ENCOUNTER — Other Ambulatory Visit (HOSPITAL_COMMUNITY): Payer: Self-pay | Admitting: Psychiatry

## 2020-12-12 ENCOUNTER — Other Ambulatory Visit: Payer: Self-pay

## 2020-12-12 DIAGNOSIS — F331 Major depressive disorder, recurrent, moderate: Secondary | ICD-10-CM

## 2020-12-12 DIAGNOSIS — F4310 Post-traumatic stress disorder, unspecified: Secondary | ICD-10-CM

## 2020-12-12 DIAGNOSIS — F900 Attention-deficit hyperactivity disorder, predominantly inattentive type: Secondary | ICD-10-CM

## 2020-12-12 MED ORDER — CLONAZEPAM 0.5 MG PO TABS: 0.5000 mg | ORAL_TABLET | Freq: Three times a day (TID) | ORAL | 0 refills | Status: DC | PRN

## 2020-12-12 MED ORDER — METHYLPHENIDATE HCL 20 MG PO TABS
20.0000 mg | ORAL_TABLET | Freq: Two times a day (BID) | ORAL | 0 refills | Status: DC
Start: 2020-12-12 — End: 2021-03-13

## 2020-12-12 MED ORDER — METHYLPHENIDATE HCL 20 MG PO TABS: 20.0000 mg | ORAL_TABLET | Freq: Two times a day (BID) | ORAL | 0 refills | Status: DC

## 2020-12-12 MED ORDER — ATOMOXETINE HCL 40 MG PO CAPS: 40.0000 mg | ORAL_CAPSULE | Freq: Two times a day (BID) | ORAL | 0 refills | Status: DC

## 2020-12-12 MED ORDER — VIIBRYD 20 MG PO TABS: 20.0000 mg | ORAL_TABLET | Freq: Every day | ORAL | 0 refills | Status: DC

## 2020-12-12 NOTE — Progress Notes (Unsigned)
Virtual Visit via Telephone Note  I connected with Dawn Jordan on 12/12/20 at  2:00 PM EST by telephone and verified that I am speaking with the correct person using two identifiers.  Location: Patient: home Provider: office   I discussed the limitations, risks, security and privacy concerns of performing an evaluation and management service by telephone and the availability of in person appointments. I also discussed with the patient that there may be a patient responsible charge related to this service. The patient expressed understanding and agreed to proceed.   History of Present Illness: Dawn Jordan states her depression is on/off. 2-3 days a week she feels overwhelmed and has difficulty concentrating and completing tasks. She feels helpless and overly emotional. She is stressed about COVID. Her mother lives in a NH and is on palliative care. 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 It makes her worry about her mom's health but also not being able to see her if COVID gets worse. Dawn Jordan takes Klonopin 2-3 times a week.  She has had 4 people who recently passed away due to various reasons. It has been hard. She is not going out much.  Dawn Jordan is dealing with a lot of pain. Her sleep is poor. She has trouble falling asleep due to pain and racing thoughts. Dawn Jordan is trying to get the right components of CPAP mask so that she can still use it. Triggers of death of elderly people or parents dying cause intrusive memories and tears. Her concentration   Observations/Objective:   Assessment and Plan:   Follow Up Instructions:    I discussed the assessment and treatment plan with the patient. The patient was provided an opportunity to ask questions and all were answered. The patient agreed with the plan and demonstrated an understanding of the instructions.   The patient was advised to call back or seek an in-person evaluation if the symptoms worsen or  if the condition fails to improve as anticipated.  I provided *** minutes of non-face-to-face time during this encounter.   Oletta Darter, MD

## 2020-12-16 DIAGNOSIS — Z23 Encounter for immunization: Secondary | ICD-10-CM | POA: Diagnosis not present

## 2020-12-25 ENCOUNTER — Ambulatory Visit (INDEPENDENT_AMBULATORY_CARE_PROVIDER_SITE_OTHER): Payer: Medicare Other | Admitting: Psychology

## 2020-12-25 DIAGNOSIS — F331 Major depressive disorder, recurrent, moderate: Secondary | ICD-10-CM | POA: Diagnosis not present

## 2020-12-26 ENCOUNTER — Ambulatory Visit (INDEPENDENT_AMBULATORY_CARE_PROVIDER_SITE_OTHER): Payer: Medicare Other

## 2020-12-26 DIAGNOSIS — Z1231 Encounter for screening mammogram for malignant neoplasm of breast: Secondary | ICD-10-CM | POA: Diagnosis not present

## 2020-12-26 DIAGNOSIS — Z0000 Encounter for general adult medical examination without abnormal findings: Secondary | ICD-10-CM

## 2020-12-26 NOTE — Patient Instructions (Signed)
Dawn Jordan , Thank you for taking time to come for your Medicare Wellness Visit. I appreciate your ongoing commitment to your health goals. Please review the following plan we discussed and let me know if I can assist you in the future.   Screening recommendations/referrals: Colonoscopy: Up to date, next due 08/05/2027 Mammogram: Currently due, orders placed this visit  Bone Density: Not due until age 56  Recommended yearly ophthalmology/optometry visit for glaucoma screening and checkup Recommended yearly dental visit for hygiene and checkup  Vaccinations: Influenza vaccine: Currently due, if you wish to receive you may do so at our office or at your local pharmacy  Pneumococcal vaccine: Not due until age 43  Tdap vaccine: Up to date, next due 12/07/2025 Shingles vaccine: Currently due for Shingrix, if you wish to receive we recommend that you do so at your local pharmacy.     Advanced directives: Advance directive discussed with you today. Even though you declined this today please call our office should you change your mind and we can give you the proper paperwork for you to fill out.   Conditions/risks identified: None   Next appointment: None   Preventive Care 40-64 Years, Female Preventive care refers to lifestyle choices and visits with your health care provider that can promote health and wellness. What does preventive care include?  A yearly physical exam. This is also called an annual well check.  Dental exams once or twice a year.  Routine eye exams. Ask your health care provider how often you should have your eyes checked.  Personal lifestyle choices, including:  Daily care of your teeth and gums.  Regular physical activity.  Eating a healthy diet.  Avoiding tobacco and drug use.  Limiting alcohol use.  Practicing safe sex.  Taking low-dose aspirin daily starting at age 12.  Taking vitamin and mineral supplements as recommended by your health care  provider. What happens during an annual well check? The services and screenings done by your health care provider during your annual well check will depend on your age, overall health, lifestyle risk factors, and family history of disease. Counseling  Your health care provider may ask you questions about your:  Alcohol use.  Tobacco use.  Drug use.  Emotional well-being.  Home and relationship well-being.  Sexual activity.  Eating habits.  Work and work Statistician.  Method of birth control.  Menstrual cycle.  Pregnancy history. Screening  You may have the following tests or measurements:  Height, weight, and BMI.  Blood pressure.  Lipid and cholesterol levels. These may be checked every 5 years, or more frequently if you are over 4 years old.  Skin check.  Lung cancer screening. You may have this screening every year starting at age 62 if you have a 30-pack-year history of smoking and currently smoke or have quit within the past 15 years.  Fecal occult blood test (FOBT) of the stool. You may have this test every year starting at age 5.  Flexible sigmoidoscopy or colonoscopy. You may have a sigmoidoscopy every 5 years or a colonoscopy every 10 years starting at age 8.  Hepatitis C blood test.  Hepatitis B blood test.  Sexually transmitted disease (STD) testing.  Diabetes screening. This is done by checking your blood sugar (glucose) after you have not eaten for a while (fasting). You may have this done every 1-3 years.  Mammogram. This may be done every 1-2 years. Talk to your health care provider about when you should start having  regular mammograms. This may depend on whether you have a family history of breast cancer.  BRCA-related cancer screening. This may be done if you have a family history of breast, ovarian, tubal, or peritoneal cancers.  Pelvic exam and Pap test. This may be done every 3 years starting at age 5. Starting at age 41, this may be  done every 5 years if you have a Pap test in combination with an HPV test.  Bone density scan. This is done to screen for osteoporosis. You may have this scan if you are at high risk for osteoporosis. Discuss your test results, treatment options, and if necessary, the need for more tests with your health care provider. Vaccines  Your health care provider may recommend certain vaccines, such as:  Influenza vaccine. This is recommended every year.  Tetanus, diphtheria, and acellular pertussis (Tdap, Td) vaccine. You may need a Td booster every 10 years.  Zoster vaccine. You may need this after age 16.  Pneumococcal 13-valent conjugate (PCV13) vaccine. You may need this if you have certain conditions and were not previously vaccinated.  Pneumococcal polysaccharide (PPSV23) vaccine. You may need one or two doses if you smoke cigarettes or if you have certain conditions. Talk to your health care provider about which screenings and vaccines you need and how often you need them. This information is not intended to replace advice given to you by your health care provider. Make sure you discuss any questions you have with your health care provider. Document Released: 12/20/2015 Document Revised: 08/12/2016 Document Reviewed: 09/24/2015 Elsevier Interactive Patient Education  2017 [employer] Prevention in the Home Falls can cause injuries. They can happen to people of all ages. There are many things you can do to make your home safe and to help prevent falls. What can I do on the outside of my home?  Regularly fix the edges of walkways and driveways and fix any cracks.  Remove anything that might make you trip as you walk through a door, such as a raised step or threshold.  Trim any bushes or trees on the path to your home.  Use bright outdoor lighting.  Clear any walking paths of anything that might make someone trip, such as rocks or tools.  Regularly check to see if  handrails are loose or broken. Make sure that both sides of any steps have handrails.  Any raised decks and porches should have guardrails on the edges.  Have any leaves, snow, or ice cleared regularly.  Use sand or salt on walking paths during winter.  Clean up any spills in your garage right away. This includes oil or grease spills. What can I do in the bathroom?  Use night lights.  Install grab bars by the toilet and in the tub and shower. Do not use towel bars as grab bars.  Use non-skid mats or decals in the tub or shower.  If you need to sit down in the shower, use a plastic, non-slip stool.  Keep the floor dry. Clean up any water that spills on the floor as soon as it happens.  Remove soap buildup in the tub or shower regularly.  Attach bath mats securely with double-sided non-slip rug tape.  Do not have throw rugs and other things on the floor that can make you trip. What can I do in the bedroom?  Use night lights.  Make sure that you have a light by your bed that is easy to reach.  Do not use any sheets or blankets that are too big for your bed. They should not hang down onto the floor.  Have a firm chair that has side arms. You can use this for support while you get dressed.  Do not have throw rugs and other things on the floor that can make you trip. What can I do in the kitchen?  Clean up any spills right away.  Avoid walking on wet floors.  Keep items that you use a lot in easy-to-reach places.  If you need to reach something above you, use a strong step stool that has a grab bar.  Keep electrical cords out of the way.  Do not use floor polish or wax that makes floors slippery. If you must use wax, use non-skid floor wax.  Do not have throw rugs and other things on the floor that can make you trip. What can I do with my stairs?  Do not leave any items on the stairs.  Make sure that there are handrails on both sides of the stairs and use them. Fix  handrails that are broken or loose. Make sure that handrails are as long as the stairways.  Check any carpeting to make sure that it is firmly attached to the stairs. Fix any carpet that is loose or worn.  Avoid having throw rugs at the top or bottom of the stairs. If you do have throw rugs, attach them to the floor with carpet tape.  Make sure that you have a light switch at the top of the stairs and the bottom of the stairs. If you do not have them, ask someone to add them for you. What else can I do to help prevent falls?  Wear shoes that:  Do not have high heels.  Have rubber bottoms.  Are comfortable and fit you well.  Are closed at the toe. Do not wear sandals.  If you use a stepladder:  Make sure that it is fully opened. Do not climb a closed stepladder.  Make sure that both sides of the stepladder are locked into place.  Ask someone to hold it for you, if possible.  Clearly mark and make sure that you can see:  Any grab bars or handrails.  First and last steps.  Where the edge of each step is.  Use tools that help you move around (mobility aids) if they are needed. These include:  Canes.  Walkers.  Scooters.  Crutches.  Turn on the lights when you go into a dark area. Replace any light bulbs as soon as they burn out.  Set up your furniture so you have a clear path. Avoid moving your furniture around.  If any of your floors are uneven, fix them.  If there are any pets around you, be aware of where they are.  Review your medicines with your doctor. Some medicines can make you feel dizzy. This can increase your chance of falling. Ask your doctor what other things that you can do to help prevent falls. This information is not intended to replace advice given to you by your health care provider. Make sure you discuss any questions you have with your health care provider. Document Released: 09/19/2009 Document Revised: 04/30/2016 Document Reviewed:  12/28/2014 Elsevier Interactive Patient Education  2017 [employer].

## 2020-12-26 NOTE — Progress Notes (Signed)
Subjective:   Dawn Jordan is a 55 y.o. female who presents for an Initial Medicare Annual Wellness Visit.   Virtual Visit via Video Note  I connected with Dawn Jordan on 12/26/20 at  1:15 PM EST by a video enabled telemedicine application and verified that I am speaking with the correct person using two identifiers.  Location: Patient: Home  Provider: Office    I discussed the limitations of evaluation and management by telemedicine and the availability of in person appointments. The patient expressed understanding and agreed to proceed.        Ofilia Neas, LPN   Review of Systems    N/A  Cardiac Risk Factors include: hypertension     Objective:    Today's Vitals   12/26/20 1322  PainSc: 9    There is no height or weight on file to calculate BMI.  Advanced Directives 12/26/2020 10/21/2019 10/30/2018 06/04/2017 06/04/2017 12/06/2014  Does Patient Have a Medical Advance Directive? No No No No No No  Would patient like information on creating a medical advance directive? No - Patient declined No - Patient declined No - Patient declined Yes (Inpatient - patient defers creating a medical advance directive at this time) - Yes - Educational materials given    Current Medications (verified) Outpatient Encounter Medications as of 12/26/2020  Medication Sig  . ACTEMRA 162 MG/0.9ML SOSY every 7 (seven) days.   Marland Kitchen atomoxetine (STRATTERA) 40 MG capsule Take 1 capsule (40 mg total) by mouth 2 (two) times daily.  . clonazePAM (KLONOPIN) 0.5 MG tablet Take 1 tablet (0.5 mg total) by mouth 3 (three) times daily as needed for anxiety.  . flecainide (TAMBOCOR) 50 MG tablet TAKE 1 TABLET BY MOUTH TWICE A DAY  . FLOVENT HFA 220 MCG/ACT inhaler INHALE TWO PUFFS INTO THE LUNGS EVERY MORNING & AT BEDTIME  . fluticasone (FLONASE) 50 MCG/ACT nasal spray Place 1 spray into both nostrils daily.  Marland Kitchen HYDROcodone-acetaminophen (NORCO/VICODIN) 5-325 MG tablet Take 1 tablet by mouth every 6 (six)  hours as needed for moderate pain.  Marland Kitchen levalbuterol (XOPENEX HFA) 45 MCG/ACT inhaler Inhale 2 puffs into the lungs every 6 (six) hours as needed for wheezing.  Marland Kitchen levocetirizine (XYZAL) 5 MG tablet Take 1 tablet (5 mg total) by mouth every evening.  . methylphenidate (RITALIN) 20 MG tablet Take 1 tablet (20 mg total) by mouth 2 (two) times daily with breakfast and lunch.  . metoprolol succinate (TOPROL-XL) 25 MG 24 hr tablet Take 1 tablet (25 mg total) by mouth daily.  . montelukast (SINGULAIR) 10 MG tablet TAKE 1 TABLET BY MOUTH EVERYDAY AT BEDTIME  . nabumetone (RELAFEN) 500 MG tablet Take 500 mg by mouth 3 (three) times daily.  . Olopatadine HCl 0.2 % SOLN Place 1 drop into both eyes daily as needed.  Marland Kitchen omeprazole (PRILOSEC) 40 MG capsule TAKE 1 CAPSULE BY MOUTH EVERY DAY 30 MINUTES BEFORE BREAKFAST  . pregabalin (LYRICA) 200 MG capsule Take 200 mg by mouth 3 (three) times daily.   Marland Kitchen tiZANidine (ZANAFLEX) 4 MG tablet TAKE 1 TABLET (4 MG TOTAL) BY MOUTH EVERY 12 (TWELVE) HOURS AS NEEDED FOR MUSCLE SPASMS.  Marland Kitchen triamcinolone ointment (KENALOG) 0.1 % Apply 1 application topically 2 (two) times daily.  . Vilazodone HCl (VIIBRYD) 20 MG TABS Take 1 tablet (20 mg total) by mouth daily.  Marland Kitchen aspirin EC 81 MG tablet Take 1 tablet (81 mg total) by mouth daily. (Patient not taking: Reported on 12/26/2020)  . EPINEPHrine (EPIPEN 2-PAK) 0.3  mg/0.3 mL IJ SOAJ injection Use for life threatening allergic reactions (Patient not taking: Reported on 12/26/2020)  . methylphenidate (RITALIN) 20 MG tablet Take 1 tablet (20 mg total) by mouth 2 (two) times daily with breakfast and lunch.  . methylphenidate (RITALIN) 20 MG tablet Take 1 tablet (20 mg total) by mouth 2 (two) times daily with breakfast and lunch.  . nitroGLYCERIN (NITROSTAT) 0.4 MG SL tablet Place 1 tablet (0.4 mg total) under the tongue every 5 (five) minutes as needed for chest pain. (Patient not taking: Reported on 12/26/2020)   Facility-Administered  Encounter Medications as of 12/26/2020  Medication  . 0.9 %  sodium chloride infusion    Allergies (verified) Eggs or egg-derived products, Latex, Other, and Codeine   History: Past Medical History:  Diagnosis Date  . A-fib ([place])   . ADD (attention deficit disorder)   . Anemia   . Anginal pain ([place])   . Anxiety   . Asthma   . Back pain   . Bone spur of foot   . Chest pain   . Chronic fatigue syndrome   . COPD (chronic obstructive pulmonary disease) ([place])   . Depression   . Dyspnea   . Fibromyalgia   . GERD (gastroesophageal reflux disease)   . Headache    "weekly-monthly" (06/04/2017)  . History of gout   . History of kidney stones   . Hypertension   . IBS (irritable bowel syndrome)   . Joint pain   . Left ankle pain   . Left sciatic nerve pain   . Myofascial pain   . Neck pain   . OSA (obstructive sleep apnea)    "getting ready to retest" (06/04/2017)  . Palpitations   . Rheumatoid arthritis ([place])   . Seasonal allergies    Past Surgical History:  Procedure Laterality Date  . CARPAL TUNNEL RELEASE Left   . CYST EXCISION     "little cysts taken off both hands and left arm" (06/04/2017)  . KNEE ARTHROSCOPY Bilateral    "3 on my left; 2 on my right" (06/04/2017)  . LAPAROSCOPIC CHOLECYSTECTOMY    . TONSILLECTOMY     Family History  Problem Relation Age of Onset  . Arthritis Mother   . Hyperlipidemia Mother   . Hypertension Mother   . Stroke Mother   . Mental retardation Mother   . Diabetes Mother   . Allergic rhinitis Mother   . Heart disease Mother   . Thyroid disease Mother   . Depression Mother   . Anxiety disorder Mother   . Bipolar disorder Mother   . Sleep apnea Mother   . Eating disorder Mother   . Diabetes Maternal Grandfather   . Hypertension Maternal Grandfather   . Diabetes Paternal Grandmother   . Hypertension Paternal Grandmother   . Cancer Paternal Grandmother        breast  . Allergic rhinitis Brother   . Colon cancer Paternal Aunt    . Lung cancer Other   . Lung cancer Maternal Uncle   . Asthma Neg Hx   . Eczema Neg Hx   . Immunodeficiency Neg Hx   . Urticaria Neg Hx   . Atopy Neg Hx   . Angioedema Neg Hx   . Esophageal cancer Neg Hx   . Stomach cancer Neg Hx   . Rectal cancer Neg Hx    Social History   Socioeconomic History  . Marital status: Married    Spouse name: Gerre Yoshino  . Number  of children: 0  . Years of education: Not on file  . Highest education level: Not on file  Occupational History  . Occupation: not employed-disabled  Tobacco Use  . Smoking status: Never Smoker  . Smokeless tobacco: Never Used  Vaping Use  . Vaping Use: Never used  Substance and Sexual Activity  . Alcohol use: No  . Drug use: No  . Sexual activity: Never  Other Topics Concern  . Not on file  Social History Narrative   Born in [place], [place]   Unemployed, on disability since 1991 for anxiety,    Education: [employer] college   Single, no children, lives with her friend (used to live with her mother with stroke, now in ALF)      Social Determinants of Health   Financial Resource Strain: [place]   . Difficulty of Paying Living Expenses: Not hard at all  Food Insecurity: No Food Insecurity  . Worried About Charity fundraiser in the Last Year: Never true  . Ran Out of Food in the Last Year: Never true  Transportation Needs: No Transportation Needs  . Lack of Transportation (Medical): No  . Lack of Transportation (Non-Medical): No  Physical Activity: Inactive  . Days of Exercise per Week: 0 days  . Minutes of Exercise per Session: 0 min  Stress: No Stress Concern Present  . Feeling of Stress : Not at all  Social Connections: Moderately Integrated  . Frequency of Communication with Friends and Family: More than three times a week  . Frequency of Social Gatherings with Friends and Family: More than three times a week  . Attends Religious Services: Never  . Active Member of Clubs or  Organizations: Yes  . Attends Archivist Meetings: Never  . Marital Status: Married    Tobacco Counseling Counseling given: Not Answered   Clinical Intake:  Pre-visit preparation completed: Yes  Pain : 0-10 Pain Score: 9  Pain Type: Chronic pain Pain Location:  (Joint) Pain Descriptors / Indicators: Sharp Pain Onset: More than a month ago Pain Frequency: Constant     Nutritional Risks: None Diabetes: No CBG done?: No Did pt. bring in CBG monitor from home?: No  How often do you need to have someone help you when you read instructions, pamphlets, or other written materials from your doctor or pharmacy?: 1 - Never What is the last grade level you completed in school?: Some college  Diabetic?No   Interpreter Needed?: No  Information entered by :: [place] of Daily Living In your present state of health, do you have any difficulty performing the following activities: 12/26/2020  Hearing? N  Vision? N  Walking or climbing stairs? Y  Dressing or bathing? N  Doing errands, shopping? N  Preparing Food and eating ? N  Using the Toilet? N  In the past six months, have you accidently leaked urine? N  Do you have problems with loss of bowel control? Y  Comment occassionally if eats greasy foods  Managing your Medications? N  Managing your Finances? N  Housekeeping or managing your Housekeeping? N  Some recent data might be hidden    Patient Care Team: [place], Betty G, MD as PCP - General (Family Medicine) Jerline Pain, MD as PCP - Cardiology (Cardiology) Gaynelle Arabian, MD as Consulting Physician (Orthopedic Surgery)  Indicate any recent Medical Services you may have received from other than [facility] providers in the past year (date may be approximate).  Assessment:   This is a routine wellness examination for Dawn Jordan.  Hearing/Vision screen  Hearing Screening   125Hz  250Hz  500Hz  1000Hz  2000Hz  3000Hz  4000Hz  6000Hz  8000Hz   Right ear:            Left ear:           Vision Screening Comments: Patient states gets her eye examined once per year   Dietary issues and exercise activities discussed: Current Exercise Habits: The patient does not participate in regular exercise at present  Goals    . Exercise 3x per week (30 min per time)    . Weight (lb) < 200 lb (90.7 kg)      Depression Screen PHQ 2/9 Scores 12/26/2020 12/26/2019 09/18/2019  PHQ - 2 Score 1 6 2   PHQ- 9 Score 1 26 -    Fall Risk Fall Risk  12/26/2020 09/18/2019 08/31/2019  Falls in the past year? 0 1 1  Number falls in past yr: 0 1 0  Comment - no recent fall -  Injury with Fall? 0 0 1  Risk for fall due to : No Fall Risks History of fall(s);Impaired balance/gait Impaired mobility;Impaired balance/gait  Follow up Falls evaluation completed;Falls prevention discussed - Falls prevention discussed    FALL RISK PREVENTION PERTAINING TO THE HOME:  Any stairs in or around the home? No  If so, are there any without handrails? No  Home free of loose throw rugs in walkways, pet beds, electrical cords, etc? Yes  Adequate lighting in your home to reduce risk of falls? Yes   ASSISTIVE DEVICES UTILIZED TO PREVENT FALLS:  Life alert? No  Use of a cane, walker or w/c? No  Grab bars in the bathroom? No  Shower chair or bench in shower? Yes  Elevated toilet seat or a handicapped toilet? No    Cognitive Function:   Normal cognitive status assessed by direct observation by this Nurse Health Advisor. No abnormalities found.        Immunizations Immunization History  Administered Date(s) Administered  . Td 12/08/2015    TDAP status: Up to date  Flu Vaccine status: Declined, Education has been provided regarding the importance of this vaccine but patient still declined. Advised may receive this vaccine at local pharmacy or Health Dept. Aware to provide a copy of the vaccination record if obtained from local pharmacy or Health Dept. Verbalized acceptance and  understanding.  Pneumococcal vaccine status: Due, Education has been provided regarding the importance of this vaccine. Advised may receive this vaccine at local pharmacy or Health Dept. Aware to provide a copy of the vaccination record if obtained from local pharmacy or Health Dept. Verbalized acceptance and understanding.  Covid-19 vaccine status: Information provided on how to obtain vaccines.   Qualifies for Shingles Vaccine? Yes   Zostavax completed No   Shingrix Completed?: No.    Education has been provided regarding the importance of this vaccine. Patient has been advised to call insurance company to determine out of pocket expense if they have not yet received this vaccine. Advised may also receive vaccine at local pharmacy or Health Dept. Verbalized acceptance and understanding.  Screening Tests Health Maintenance  Topic Date Due  . COVID-19 Vaccine (1) Never done  . PAP SMEAR-Modifier  Never done  . MAMMOGRAM  04/14/2020  . INFLUENZA VACCINE  02/05/2029 (Originally 07/07/2020)  . TETANUS/TDAP  12/07/2025  . COLONOSCOPY (Pts 45-42yrs Insurance coverage will need to be confirmed)  08/05/2027  . Hepatitis C Screening  Completed  .  HIV Screening  Completed    Health Maintenance  Health Maintenance Due  Topic Date Due  . COVID-19 Vaccine (1) Never done  . PAP SMEAR-Modifier  Never done  . MAMMOGRAM  04/14/2020    Colorectal cancer screening: Type of screening: Colonoscopy. Completed 08/04/2017. Repeat every 10 years  Mammogram status: Ordered 12/26/2020. Pt provided with contact info and advised to call to schedule appt.   Bone Density Status: Not due at this age   Lung Cancer Screening: (Low Dose CT Chest recommended if Age 22-80 years, 30 pack-year currently smoking OR have quit w/in 15years.) does not qualify.   Lung Cancer Screening Referral: N/A   Additional Screening:  Hepatitis C Screening: does qualify; Completed 04/16/2020   Vision Screening: Recommended  annual ophthalmology exams for early detection of glaucoma and other disorders of the eye. Is the patient up to date with their annual eye exam?  Yes  Who is the provider or what is the name of the office in which the patient attends annual eye exams? Dr. Manuella Ghazi  If pt is not established with a provider, would they like to be referred to a provider to establish care? No .   Dental Screening: Recommended annual dental exams for proper oral hygiene  Community Resource Referral / Chronic Care Management: CRR required this visit?  No   CCM required this visit?  No      Plan:     I have personally reviewed and noted the following in the patient's chart:   . Medical and social history . Use of alcohol, tobacco or illicit drugs  . Current medications and supplements . Functional ability and status . Nutritional status . Physical activity . Advanced directives . List of other physicians . Hospitalizations, surgeries, and ER visits in previous 12 months . Vitals . Screenings to include cognitive, depression, and falls . Referrals and appointments  In addition, I have reviewed and discussed with patient certain preventive protocols, quality metrics, and best practice recommendations. A written personalized care plan for preventive services as well as general preventive health recommendations were provided to patient.     Ofilia Neas, LPN   [id number]   Nurse Notes: None

## 2020-12-27 ENCOUNTER — Telehealth: Payer: Self-pay | Admitting: Family Medicine

## 2020-12-27 NOTE — Telephone Encounter (Signed)
Pt had pfizer for 1 and 2nd dose and moderna for her booster. Pt does not have her vaccination card handy will bring to next appt with dr Martinique to scan into her chart

## 2021-01-07 ENCOUNTER — Ambulatory Visit (INDEPENDENT_AMBULATORY_CARE_PROVIDER_SITE_OTHER): Payer: Medicare Other | Admitting: Psychology

## 2021-01-07 DIAGNOSIS — F331 Major depressive disorder, recurrent, moderate: Secondary | ICD-10-CM

## 2021-01-09 DIAGNOSIS — Z20822 Contact with and (suspected) exposure to covid-19: Secondary | ICD-10-CM | POA: Diagnosis not present

## 2021-01-10 ENCOUNTER — Other Ambulatory Visit: Payer: Self-pay | Admitting: Family Medicine

## 2021-01-28 DIAGNOSIS — M0609 Rheumatoid arthritis without rheumatoid factor, multiple sites: Secondary | ICD-10-CM | POA: Diagnosis not present

## 2021-01-28 DIAGNOSIS — R7989 Other specified abnormal findings of blood chemistry: Secondary | ICD-10-CM | POA: Diagnosis not present

## 2021-01-28 DIAGNOSIS — M797 Fibromyalgia: Secondary | ICD-10-CM | POA: Diagnosis not present

## 2021-01-28 DIAGNOSIS — M2550 Pain in unspecified joint: Secondary | ICD-10-CM | POA: Diagnosis not present

## 2021-01-28 DIAGNOSIS — Z6841 Body Mass Index (BMI) 40.0 and over, adult: Secondary | ICD-10-CM | POA: Diagnosis not present

## 2021-01-28 DIAGNOSIS — Z79899 Other long term (current) drug therapy: Secondary | ICD-10-CM | POA: Diagnosis not present

## 2021-01-29 ENCOUNTER — Ambulatory Visit (INDEPENDENT_AMBULATORY_CARE_PROVIDER_SITE_OTHER): Payer: Medicare Other | Admitting: Family Medicine

## 2021-01-29 ENCOUNTER — Encounter: Payer: Self-pay | Admitting: Family Medicine

## 2021-01-29 ENCOUNTER — Other Ambulatory Visit: Payer: Self-pay

## 2021-01-29 VITALS — BP 120/70 | HR 80 | Resp 16 | Ht 64.0 in | Wt 241.0 lb

## 2021-01-29 DIAGNOSIS — R519 Headache, unspecified: Secondary | ICD-10-CM | POA: Diagnosis not present

## 2021-01-29 DIAGNOSIS — L02811 Cutaneous abscess of head [any part, except face]: Secondary | ICD-10-CM

## 2021-01-29 MED ORDER — DOXYCYCLINE HYCLATE 100 MG PO TABS: 100.0000 mg | ORAL_TABLET | Freq: Two times a day (BID) | ORAL | 0 refills | Status: AC

## 2021-01-29 NOTE — Patient Instructions (Signed)
A few things to remember from today's visit:   Scalp abscess - Plan: doxycycline (VIBRA-TABS) 100 MG tablet  Headache, unspecified headache type  Warm compresses. If recurrent we may need to have surgeon removing lesions, ? Sebaceous cyst.  Please be sure medication list is accurate. If a new problem present, please set up appointment sooner than planned today.

## 2021-01-29 NOTE — Progress Notes (Signed)
Chief Complaint  Patient presents with  . cyst on neck   HPI: [name] is a 55 y.o. female, who is here today complaining of tender lesion right lower occipital area. Noted lesion 3-4 days ago. It seems to be growing. No hx of trauma. Pain is constant, severe.  She has not noted fever,chills,sore throat,or oral lesions. She is rubbing alcohol and neosporin.  Occipital headache. She is not sure if it is caused by above problem ot past of her chronic headache. She is taking Goody powders daily.  Not identified exacerbating or alleviating factors. No associated visual changes,N/V,or focal deficit.  Review of Systems  Constitutional: Positive for fatigue. Negative for appetite change.  Genitourinary: Negative for decreased urine volume, dysuria and hematuria.  Musculoskeletal: Positive for arthralgias and myalgias. Negative for gait problem.  Skin: Negative for pallor and rash.  Allergic/Immunologic: Positive for environmental allergies.  Neurological: Negative for syncope and facial asymmetry.  Rest see pertinent positives and negatives per HPI.  Current Outpatient Medications on File Prior to Visit  Medication Sig Dispense Refill  . ACTEMRA 162 MG/0.9ML SOSY every 7 (seven) days.     Marland Kitchen aspirin EC 81 MG tablet Take 1 tablet (81 mg total) by mouth daily. (Patient not taking: Reported on 12/26/2020) 90 tablet 3  . atomoxetine (STRATTERA) 40 MG capsule Take 1 capsule (40 mg total) by mouth 2 (two) times daily. 180 capsule 0  . clonazePAM (KLONOPIN) 0.5 MG tablet Take 1 tablet (0.5 mg total) by mouth 3 (three) times daily as needed for anxiety. 90 tablet 0  . EPINEPHrine (EPIPEN 2-PAK) 0.3 mg/0.3 mL IJ SOAJ injection Use for life threatening allergic reactions (Patient not taking: Reported on 12/26/2020) 2 each 1  . flecainide (TAMBOCOR) 50 MG tablet TAKE 1 TABLET BY MOUTH TWICE A DAY 180 tablet 3  . FLOVENT HFA 220 MCG/ACT inhaler INHALE TWO PUFFS INTO THE LUNGS EVERY MORNING  & AT BEDTIME 12 g 0  . fluticasone (FLONASE) 50 MCG/ACT nasal spray Place 1 spray into both nostrils daily. 16 g 5  . HYDROcodone-acetaminophen (NORCO/VICODIN) 5-325 MG tablet Take 1 tablet by mouth every 6 (six) hours as needed for moderate pain.    Marland Kitchen levalbuterol (XOPENEX HFA) 45 MCG/ACT inhaler Inhale 2 puffs into the lungs every 6 (six) hours as needed for wheezing. 1 Inhaler 5  . levocetirizine (XYZAL) 5 MG tablet Take 1 tablet (5 mg total) by mouth every evening. 30 tablet 0  . methylphenidate (RITALIN) 20 MG tablet Take 1 tablet (20 mg total) by mouth 2 (two) times daily with breakfast and lunch. 60 tablet 0  . methylphenidate (RITALIN) 20 MG tablet Take 1 tablet (20 mg total) by mouth 2 (two) times daily with breakfast and lunch. 60 tablet 0  . methylphenidate (RITALIN) 20 MG tablet Take 1 tablet (20 mg total) by mouth 2 (two) times daily with breakfast and lunch. 60 tablet 0  . metoprolol succinate (TOPROL-XL) 25 MG 24 hr tablet Take 1 tablet (25 mg total) by mouth daily. 90 tablet 3  . montelukast (SINGULAIR) 10 MG tablet TAKE 1 TABLET BY MOUTH EVERYDAY AT BEDTIME 30 tablet 0  . nabumetone (RELAFEN) 500 MG tablet Take 500 mg by mouth 3 (three) times daily.  3  . nitroGLYCERIN (NITROSTAT) 0.4 MG SL tablet Place 1 tablet (0.4 mg total) under the tongue every 5 (five) minutes as needed for chest pain. (Patient not taking: Reported on 12/26/2020) 25 tablet prn  . Olopatadine HCl 0.2 %  SOLN Place 1 drop into both eyes daily as needed. 2.5 mL 5  . omeprazole (PRILOSEC) 40 MG capsule TAKE 1 CAPSULE BY MOUTH EVERY DAY 30 MINUTES BEFORE BREAKFAST 90 capsule 2  . pregabalin (LYRICA) 200 MG capsule Take 200 mg by mouth 3 (three) times daily.     Marland Kitchen tiZANidine (ZANAFLEX) 4 MG tablet TAKE 1 TABLET (4 MG TOTAL) BY MOUTH EVERY 12 (TWELVE) HOURS AS NEEDED FOR MUSCLE SPASMS. 180 tablet 1  . triamcinolone ointment (KENALOG) 0.1 % Apply 1 application topically 2 (two) times daily. 60 g 5  . Vilazodone HCl  (VIIBRYD) 20 MG TABS Take 1 tablet (20 mg total) by mouth daily. 90 tablet 0   Current Facility-Administered Medications on File Prior to Visit  Medication Dose Route Frequency Provider Last Rate Last Admin  . 0.9 %  sodium chloride infusion  500 mL Intravenous Continuous Milus Banister, MD         Past Medical History:  Diagnosis Date  . A-fib ([place])   . ADD (attention deficit disorder)   . Anemia   . Anginal pain ([place])   . Anxiety   . Asthma   . Back pain   . Bone spur of foot   . Chest pain   . Chronic fatigue syndrome   . COPD (chronic obstructive pulmonary disease) ([place])   . Depression   . Dyspnea   . Fibromyalgia   . GERD (gastroesophageal reflux disease)   . Headache    "weekly-monthly" (06/04/2017)  . History of gout   . History of kidney stones   . Hypertension   . IBS (irritable bowel syndrome)   . Joint pain   . Left ankle pain   . Left sciatic nerve pain   . Myofascial pain   . Neck pain   . OSA (obstructive sleep apnea)    "getting ready to retest" (06/04/2017)  . Palpitations   . Rheumatoid arthritis ([place])   . Seasonal allergies    Allergies  Allergen Reactions  . Eggs Or Egg-Derived Products Diarrhea  . Latex Anaphylaxis, Hives and Itching    "Anaphylaxis is only when I am in a closed area (ex: car with balloons)"  . Other Other (See Comments)    Sinus headache from new plastics, carpets, etc.  . Codeine     Headaches     Social History   Socioeconomic History  . Marital status: Married    Spouse name: Debora Stockdale  . Number of children: 0  . Years of education: Not on file  . Highest education level: Not on file  Occupational History  . Occupation: not employed-disabled  Tobacco Use  . Smoking status: Never Smoker  . Smokeless tobacco: Never Used  Vaping Use  . Vaping Use: Never used  Substance and Sexual Activity  . Alcohol use: No  . Drug use: No  . Sexual activity: Never  Other Topics Concern  . Not on file  Social  History Narrative   Born in [place], [place]   Unemployed, on disability since 1991 for anxiety,    Education: [employer]   Single, no children, lives with her friend (used to live with her mother with stroke, now in ALF)      Social Determinants of Health   Financial Resource Strain: [place]   . Difficulty of Paying Living Expenses: Not hard at all  Food Insecurity: No Food Insecurity  . Worried About Charity fundraiser in the Last  Year: Never true  . Ran Out of Food in the Last Year: Never true  Transportation Needs: No Transportation Needs  . Lack of Transportation (Medical): No  . Lack of Transportation (Non-Medical): No  Physical Activity: Inactive  . Days of Exercise per Week: 0 days  . Minutes of Exercise per Session: 0 min  Stress: No Stress Concern Present  . Feeling of Stress : Not at all  Social Connections: Moderately Integrated  . Frequency of Communication with Friends and Family: More than three times a week  . Frequency of Social Gatherings with Friends and Family: More than three times a week  . Attends Religious Services: Never  . Active Member of Clubs or Organizations: Yes  . Attends Archivist Meetings: Never  . Marital Status: Married    Vitals:   01/29/21 1432  BP: 120/70  Pulse: 80  Resp: 16  SpO2: 97%   Body mass index is 41.37 kg/m.  Physical Exam Vitals and nursing note reviewed.  Constitutional:      Appearance: She is well-groomed.  HENT:     Head: Normocephalic and atraumatic.      Mouth/Throat:     Mouth: Mucous membranes are moist.     Pharynx: Oropharynx is clear.  Eyes:     Conjunctiva/sclera: Conjunctivae normal.  Cardiovascular:     Rate and Rhythm: Normal rate and regular rhythm.     Heart sounds: No murmur heard.   Pulmonary:     Effort: Pulmonary effort is normal. No respiratory distress.     Breath sounds: Normal breath sounds.  Lymphadenopathy:     Head:     Right side of head:  No submandibular adenopathy.     Left side of head: No submandibular adenopathy.     Cervical: No cervical adenopathy.  Neurological:     General: No focal deficit present.     Mental Status: She is alert and oriented to person, place, and time.     Gait: Gait normal.  Psychiatric:        Mood and Affect: Mood is not anxious or depressed.   ASSESSMENT AND PLAN:  [name] was seen today for cyst on neck.  Diagnoses and all orders for this visit:  Scalp abscess Not ready to I&D. Warm compresses. Oral abx x 7 days. Instructed about warning signs.  -     doxycycline (VIBRA-TABS) 100 MG tablet; Take 1 tablet (100 mg total) by mouth 2 (two) times daily for 7 days.  Headache, unspecified headache type ? Tension headache. Could be aggravated by above problem. She has seen neuro in the past. I do not think imaging is needed today. Monitor for new symptoms.  Return if symptoms worsen or fail to improve.   Betty G. Martinique, MD  [facility]. [place] office.  A few things to remember from today's visit:   Scalp abscess - Plan: doxycycline (VIBRA-TABS) 100 MG tablet  Headache, unspecified headache type  Warm compresses. If recurrent we may need to have surgeon removing lesions, ? Sebaceous cyst.  Please be sure medication list is accurate. If a new problem present, please set up appointment sooner than planned today.

## 2021-02-12 ENCOUNTER — Other Ambulatory Visit (HOSPITAL_COMMUNITY): Payer: Self-pay | Admitting: Psychiatry

## 2021-02-12 ENCOUNTER — Other Ambulatory Visit: Payer: Self-pay | Admitting: Family Medicine

## 2021-02-12 DIAGNOSIS — F4310 Post-traumatic stress disorder, unspecified: Secondary | ICD-10-CM

## 2021-03-13 ENCOUNTER — Telehealth (INDEPENDENT_AMBULATORY_CARE_PROVIDER_SITE_OTHER): Payer: Medicare Other | Admitting: Psychiatry

## 2021-03-13 ENCOUNTER — Other Ambulatory Visit: Payer: Self-pay

## 2021-03-13 DIAGNOSIS — F4310 Post-traumatic stress disorder, unspecified: Secondary | ICD-10-CM

## 2021-03-13 DIAGNOSIS — F900 Attention-deficit hyperactivity disorder, predominantly inattentive type: Secondary | ICD-10-CM

## 2021-03-13 DIAGNOSIS — F331 Major depressive disorder, recurrent, moderate: Secondary | ICD-10-CM | POA: Diagnosis not present

## 2021-03-13 MED ORDER — METHYLPHENIDATE HCL 20 MG PO TABS
20.0000 mg | ORAL_TABLET | Freq: Two times a day (BID) | ORAL | 0 refills | Status: DC
Start: 2021-03-13 — End: 2021-04-24

## 2021-03-13 MED ORDER — CLONAZEPAM 0.5 MG PO TABS: 0.5000 mg | ORAL_TABLET | Freq: Three times a day (TID) | ORAL | 0 refills | Status: DC | PRN

## 2021-03-13 MED ORDER — VIIBRYD 40 MG PO TABS: 40.0000 mg | ORAL_TABLET | Freq: Every day | ORAL | 0 refills | Status: DC

## 2021-03-13 MED ORDER — METHYLPHENIDATE HCL 20 MG PO TABS: 20.0000 mg | ORAL_TABLET | Freq: Two times a day (BID) | ORAL | 0 refills | Status: DC

## 2021-03-13 MED ORDER — ATOMOXETINE HCL 40 MG PO CAPS: 40.0000 mg | ORAL_CAPSULE | Freq: Two times a day (BID) | ORAL | 0 refills | Status: DC

## 2021-03-13 NOTE — Progress Notes (Signed)
Virtual Visit via Telephone Note  I connected with Dawn Jordan on 03/13/21 at 10:00 AM EDT by telephone and verified that I am speaking with the correct person using two identifiers.  Location: Patient: home Provider: office   I discussed the limitations, risks, security and privacy concerns of performing an evaluation and management service by telephone and the availability of in person appointments. I also discussed with the patient that there may be a patient responsible charge related to this service. The patient expressed understanding and agreed to proceed.   History of Present Illness: Dawn Jordan states her depression is starting to improve. She still struggles but the spring weather is helping. Over the last 2 weeks she has only had a few days of depression. Her motivation and energy are slowly improving. Her anhedonia is starting to improve. Her anxiety remains high. She has had a few panic attacks. She rarely takes Klonopin because she doesn't want to get addicted.  Dawn Jordan shares that her stress tolerance is generally low. Her concentration is ok and she admits that is not always using other tips and tricks to manage her ADHD. She denies SI/HI. Her PTSD remains high. She has trouble with new people and crowds. The Viibyrd is helping and she is tolerating it.    Observations/Objective:  General Appearance: unable to assess  Eye Contact:  unable to assess  Speech:  Clear and Coherent and Normal Rate  Volume:  Normal  Mood:  Anxious and Depressed  Affect:  Full Range  Thought Process:  Coherent and Descriptions of Associations: Circumstantial  Orientation:  Full (Time, Place, and Person)  Thought Content:  Logical  Suicidal Thoughts:  No  Homicidal Thoughts:  No  Memory:  Immediate;   Good  Judgement:  Good  Insight:  Good  Psychomotor Activity: unable to assess  Concentration:  Concentration: Fair  Recall:  Good  Fund of Knowledge:  Good  Language:  Good  Akathisia:  unable to  assess  Handed:  Right  AIMS (if indicated):     Assets:  Communication Skills Desire for Improvement Financial Resources/Insurance Housing Intimacy Leisure Time Social Support Talents/Skills Transportation Vocational/Educational  ADL's:  unable to assess  Cognition:  WNL  Sleep:         Assessment and Plan: Depression screen [facility] 2/9 03/13/2021 12/26/2020 12/26/2019 09/18/2019  Decreased Interest 0 0 3 1  Down, Depressed, Hopeless 1 1 3 1   PHQ - 2 Score 1 1 6 2   Altered sleeping - 0 3 -  Tired, decreased energy - 0 3 -  Change in appetite - 0 3 -  Feeling bad or failure about yourself  - 0 3 -  Trouble concentrating - 0 3 -  Moving slowly or fidgety/restless - 0 3 -  Suicidal thoughts - 0 2 -  PHQ-9 Score - 1 26 -  Difficult doing work/chores - Not difficult at all Extremely dIfficult -    Flowsheet Row Video Visit from 03/13/2021 in [facility]  C-SSRS RISK CATEGORY No Risk      -increase Viibryd to target mood and anxiety  1. PTSD (post-traumatic stress disorder) - clonazePAM (KLONOPIN) 0.5 MG tablet; Take 1 tablet (0.5 mg total) by mouth 3 (three) times daily as needed for anxiety.  Dispense: 90 tablet; Refill: 0 - Vilazodone HCl (VIIBRYD) 40 MG TABS; Take 1 tablet (40 mg total) by mouth daily.  Dispense: 90 tablet; Refill: 0  2. Attention deficit hyperactivity disorder (ADHD), predominantly inattentive type -  atomoxetine (STRATTERA) 40 MG capsule; Take 1 capsule (40 mg total) by mouth 2 (two) times daily.  Dispense: 180 capsule; Refill: 0 - methylphenidate (RITALIN) 20 MG tablet; Take 1 tablet (20 mg total) by mouth 2 (two) times daily with breakfast and lunch.  Dispense: 60 tablet; Refill: 0 - methylphenidate (RITALIN) 20 MG tablet; Take 1 tablet (20 mg total) by mouth 2 (two) times daily with breakfast and lunch.  Dispense: 60 tablet; Refill: 0  3. MDD (major depressive disorder), recurrent episode, moderate (HCC) -  Vilazodone HCl (VIIBRYD) 40 MG TABS; Take 1 tablet (40 mg total) by mouth daily.  Dispense: 90 tablet; Refill: 0    Follow Up Instructions: In 2 months or sooner if needed   I discussed the assessment and treatment plan with the patient. The patient was provided an opportunity to ask questions and all were answered. The patient agreed with the plan and demonstrated an understanding of the instructions.   The patient was advised to call back or seek an in-person evaluation if the symptoms worsen or if the condition fails to improve as anticipated.  I provided 23 minutes of non-face-to-face time during this encounter.   Charlcie Cradle, MD

## 2021-03-20 DIAGNOSIS — M5459 Other low back pain: Secondary | ICD-10-CM | POA: Diagnosis not present

## 2021-03-20 DIAGNOSIS — M5136 Other intervertebral disc degeneration, lumbar region: Secondary | ICD-10-CM | POA: Diagnosis not present

## 2021-03-20 DIAGNOSIS — M5030 Other cervical disc degeneration, unspecified cervical region: Secondary | ICD-10-CM | POA: Diagnosis not present

## 2021-03-20 DIAGNOSIS — Z79891 Long term (current) use of opiate analgesic: Secondary | ICD-10-CM | POA: Diagnosis not present

## 2021-03-24 ENCOUNTER — Other Ambulatory Visit: Payer: Self-pay | Admitting: Family Medicine

## 2021-04-22 ENCOUNTER — Other Ambulatory Visit: Payer: Self-pay | Admitting: Family Medicine

## 2021-04-24 ENCOUNTER — Other Ambulatory Visit: Payer: Self-pay

## 2021-04-24 ENCOUNTER — Other Ambulatory Visit: Payer: Self-pay | Admitting: Family Medicine

## 2021-04-24 ENCOUNTER — Other Ambulatory Visit: Payer: Self-pay | Admitting: Cardiology

## 2021-04-24 ENCOUNTER — Telehealth (INDEPENDENT_AMBULATORY_CARE_PROVIDER_SITE_OTHER): Payer: Medicare Other | Admitting: Psychiatry

## 2021-04-24 DIAGNOSIS — K219 Gastro-esophageal reflux disease without esophagitis: Secondary | ICD-10-CM

## 2021-04-24 DIAGNOSIS — F900 Attention-deficit hyperactivity disorder, predominantly inattentive type: Secondary | ICD-10-CM

## 2021-04-24 DIAGNOSIS — F331 Major depressive disorder, recurrent, moderate: Secondary | ICD-10-CM

## 2021-04-24 DIAGNOSIS — F4310 Post-traumatic stress disorder, unspecified: Secondary | ICD-10-CM | POA: Diagnosis not present

## 2021-04-24 MED ORDER — METHYLPHENIDATE HCL 20 MG PO TABS: 20.0000 mg | ORAL_TABLET | Freq: Three times a day (TID) | ORAL | 0 refills | Status: DC

## 2021-04-24 MED ORDER — ATOMOXETINE HCL 40 MG PO CAPS: 40.0000 mg | ORAL_CAPSULE | Freq: Two times a day (BID) | ORAL | 0 refills | Status: DC

## 2021-04-24 MED ORDER — VIIBRYD 40 MG PO TABS: 40.0000 mg | ORAL_TABLET | Freq: Every day | ORAL | 0 refills | Status: DC

## 2021-04-24 MED ORDER — CLONAZEPAM 0.5 MG PO TABS
0.5000 mg | ORAL_TABLET | Freq: Three times a day (TID) | ORAL | 2 refills | Status: DC | PRN
Start: 2021-04-24 — End: 2021-07-17

## 2021-04-24 MED ORDER — METHYLPHENIDATE HCL 20 MG PO TABS
20.0000 mg | ORAL_TABLET | Freq: Three times a day (TID) | ORAL | 0 refills | Status: DC
Start: 2021-04-24 — End: 2021-06-26
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
## 2021-06-13 ENCOUNTER — Telehealth: Payer: Self-pay | Admitting: Family Medicine

## 2021-06-13 ENCOUNTER — Other Ambulatory Visit: Payer: Self-pay | Admitting: *Deleted

## 2021-06-13 MED ORDER — MONTELUKAST SODIUM 10 MG PO TABS: 10.0000 mg | ORAL_TABLET | Freq: Every day | ORAL | 0 refills | Status: DC

## 2021-06-13 NOTE — Telephone Encounter (Signed)
Courtesy refill sent in. Called patient and advised of refill being sent in and to keep appointment for further refills. Patient verbalized understanding.

## 2021-06-13 NOTE — Telephone Encounter (Signed)
Patient called requesting courtesy refill for singulair. Patient was last seen in 03/2020, I did advise patient that she would need an appointment. Patient scheduled for 06/26/2021. Patient would like refill sent to [facility] ([id number]).   Best contact number [phone]

## 2021-06-18 DIAGNOSIS — M797 Fibromyalgia: Secondary | ICD-10-CM | POA: Diagnosis not present

## 2021-06-18 DIAGNOSIS — Z79899 Other long term (current) drug therapy: Secondary | ICD-10-CM | POA: Diagnosis not present

## 2021-06-18 DIAGNOSIS — M2550 Pain in unspecified joint: Secondary | ICD-10-CM | POA: Diagnosis not present

## 2021-06-18 DIAGNOSIS — R7989 Other specified abnormal findings of blood chemistry: Secondary | ICD-10-CM | POA: Diagnosis not present

## 2021-06-18 DIAGNOSIS — Z6841 Body Mass Index (BMI) 40.0 and over, adult: Secondary | ICD-10-CM | POA: Diagnosis not present

## 2021-06-18 DIAGNOSIS — M0609 Rheumatoid arthritis without rheumatoid factor, multiple sites: Secondary | ICD-10-CM | POA: Diagnosis not present

## 2021-06-19 DIAGNOSIS — G894 Chronic pain syndrome: Secondary | ICD-10-CM | POA: Diagnosis not present

## 2021-06-26 ENCOUNTER — Encounter: Payer: Self-pay | Admitting: Family Medicine

## 2021-06-26 ENCOUNTER — Ambulatory Visit (INDEPENDENT_AMBULATORY_CARE_PROVIDER_SITE_OTHER): Payer: Medicare Other | Admitting: Family Medicine

## 2021-06-26 ENCOUNTER — Other Ambulatory Visit: Payer: Self-pay

## 2021-06-26 VITALS — BP 130/80 | Ht 64.0 in | Wt 240.0 lb

## 2021-06-26 DIAGNOSIS — J3089 Other allergic rhinitis: Secondary | ICD-10-CM

## 2021-06-26 DIAGNOSIS — K219 Gastro-esophageal reflux disease without esophagitis: Secondary | ICD-10-CM | POA: Diagnosis not present

## 2021-06-26 DIAGNOSIS — J302 Other seasonal allergic rhinitis: Secondary | ICD-10-CM

## 2021-06-26 DIAGNOSIS — Z91038 Other insect allergy status: Secondary | ICD-10-CM

## 2021-06-26 DIAGNOSIS — J4541 Moderate persistent asthma with (acute) exacerbation: Secondary | ICD-10-CM | POA: Diagnosis not present

## 2021-06-26 DIAGNOSIS — H1013 Acute atopic conjunctivitis, bilateral: Secondary | ICD-10-CM | POA: Diagnosis not present

## 2021-06-26 DIAGNOSIS — H1010 Acute atopic conjunctivitis, unspecified eye: Secondary | ICD-10-CM

## 2021-06-26 MED ORDER — LEVALBUTEROL TARTRATE 45 MCG/ACT IN AERO: INHALATION_SPRAY | RESPIRATORY_TRACT | 2 refills | Status: DC

## 2021-06-26 MED ORDER — LEVOCETIRIZINE DIHYDROCHLORIDE 5 MG PO TABS: 5.0000 mg | ORAL_TABLET | Freq: Every evening | ORAL | 5 refills | Status: DC

## 2021-06-26 MED ORDER — FLUTICASONE PROPIONATE 50 MCG/ACT NA SUSP: NASAL | 5 refills | Status: DC

## 2021-06-26 MED ORDER — EPINEPHRINE 0.3 MG/0.3ML IJ SOAJ: INTRAMUSCULAR | 1 refills | Status: DC

## 2021-06-26 MED ORDER — MONTELUKAST SODIUM 10 MG PO TABS: 10.0000 mg | ORAL_TABLET | Freq: Every day | ORAL | 5 refills | Status: DC

## 2021-06-26 MED ORDER — FLUTICASONE PROPIONATE HFA 220 MCG/ACT IN AERO: INHALATION_SPRAY | RESPIRATORY_TRACT | 5 refills | Status: DC

## 2021-06-26 MED ORDER — OLOPATADINE HCL 0.2 % OP SOLN: 1.0000 [drp] | Freq: Every day | OPHTHALMIC | 5 refills | Status: DC | PRN

## 2021-06-26 NOTE — Progress Notes (Signed)
RE: Dawn Jordan MRN: [id number] DOB: [birth year] Date of Telemedicine Visit: 06/26/2021  Referring provider: [place], Betty G, MD Primary care provider: Martinique, Betty G, MD  Chief Complaint: Asthma (Says she has had a few issues since she has been without Singular. Coughing, wheezing, tight chest. Since she refilled her medications the wheezing has decreased. ), Sinus Problem (Pollen causes issues.), and Allergic Rhinitis  (Out of Xyzal and pollen is high causes issues.)   Telemedicine Follow Up Visit via Telephone: I connected with Dawn Jordan for a follow up on 06/26/21 by telephone and verified that I am speaking with the correct person using two identifiers.   I discussed the limitations, risks, security and privacy concerns of performing an evaluation and management service by telephone and the availability of in person appointments. I also discussed with the patient that there may be a patient responsible charge related to this service. The patient expressed understanding and agreed to proceed.  Patient is at home  Provider is at the home office.  Visit start time: 61 Visit end time: 23 Insurance consent/check in by: [facility] consent and medical assistant/nurse: C[place]  History of Present Illness: She is a 55 y.o. female, who is being followed for asthma, allergic rhinitis, allergic conjunctivitis, Atopic dermatitis, and allergy to stinging insects and ants. Her previous allergy office visit was on 03/29/2020 with Gareth Morgan, [place]. At today's visit, she reports her asthma had been well controlled until about 1 month ago when she ran out of montelukast. At that time, she began to develop shortness of breath, wheeze, and dry cough. She uses her Flovent 220 and albuterol inhalers interchangeably with little relief of symptoms. She has recently refilled her montelukast and reports her symptoms have started to resolve. Allergic rhinitis is reported as moderately well controlled with nasal  congestion as the main symptom for which she continues Xyzal 5 mg once a day and occasionally uses Flonase and saline nasal drainage. Allergic conjunctivitis is reported as poorly controlled with symptoms including red and itchy eyes for which she occasionally uses a lubricating eye drop and occasionally uses an allergy eye drop. Reflux is well controlled with omeprazole once a day. Atopic dermatitis is reported as moderately well controlled with dry skin on her arms and legs as well as red and itchy patches on bilateral upper arms. She continues to avoid stinging insects and ants without any incidences or epinephrine use since her last visit to this clinic. Her current medications are listed in the chart.   Assessment and Plan: Dawn Jordan is a 55 y.o. female with: Patient Instructions  Moderate persistent asthma    Increase Flovent 220- to 2 puffs twice a day with a spacer to prevent cough or wheeze Continue montelukast 10 mg once a day to prevent cough or wheeze Continue Xopenex 2 puffs every 4-6 hours as needed for cough or wheeze  Seasonal and perennial allergic rhinitis (indoor molds, outdoor molds, cockroach, dust mites) Continue allergen avoidance measures as listed below Restart Xyzal 5 mg once a day as needed for a runny nose Continue Flonase 1 spray in each nostril once a day as needed for a stuffy nose.  In the right nostril, point the applicator out toward the right ear. In the left nostril, point the applicator out toward the left ear Consider saline nasal rinses as needed for nasal symptoms. Use this before any medicated nasal sprays for best result  Allergic conjunctivitis Continue Pataday one drop per eye once daily as needed for  red or itchy eyes  Reflux Continue dietary and lifestyle modifications as listed below Continue omeprazole 40 mg once a day as previously prescribed. Take this medication 30 minutes before the first meal of the day for best results  History of allergy to  stinging insect and fire ant bite Continue to avoid stinging insects and fire ants. In case of an allergic reaction, take Benadryl 50 mg every 4 hours, and if life-threatening symptoms occur, inject with EpiPen 0.3 mg.  Allergy to metal Continue to avoid triggering metals (nickel, chromium, copper sulfate)  Atopic dermatitis Continue a daily moisturizer For red itchy areas below your face, apply triamcinolone 0.1% ointment twice a day as needed  Call the clinic if this treatment plan is not working well for you.  Follow up in the clinic in 6 months or sooner if needed  Return in about 6 months (around 12/27/2021), or if symptoms worsen or fail to improve.  Meds ordered this encounter  Medications   EPINEPHrine (EPIPEN 2-PAK) 0.3 mg/0.3 mL IJ SOAJ injection    Sig: Use for life threatening allergic reactions    Dispense:  2 each    Refill:  1    Dispense generic [employer]   fluticasone (FLOVENT HFA) 220 MCG/ACT inhaler    Sig: INHALE TWO PUFFS INTO THE LUNGS EVERY MORNING & AT BEDTIME    Dispense:  12 g    Refill:  5   fluticasone (FLONASE) 50 MCG/ACT nasal spray    Sig: PLACE ONE SPRAY IN EACH NOSTRIL EVERY DAY    Dispense:  16 g    Refill:  5   Olopatadine HCl 0.2 % SOLN    Sig: Place 1 drop into both eyes daily as needed.    Dispense:  2.5 mL    Refill:  5   montelukast (SINGULAIR) 10 MG tablet    Sig: Take 1 tablet (10 mg total) by mouth at bedtime.    Dispense:  30 tablet    Refill:  5   levocetirizine (XYZAL) 5 MG tablet    Sig: Take 1 tablet (5 mg total) by mouth every evening.    Dispense:  30 tablet    Refill:  5   levalbuterol (XOPENEX HFA) 45 MCG/ACT inhaler    Sig: INHALE TWO PUFFS INTO THE LUNGS EVERY 6 HOURS AS NEEDED FOR WHEEZING    Dispense:  15 g    Refill:  2    Medication List:  Current Outpatient Medications  Medication Sig Dispense Refill   ACTEMRA 162 MG/0.9ML SOSY every 7 (seven) days.      atomoxetine (STRATTERA) 40 MG capsule Take 1 capsule  (40 mg total) by mouth 2 (two) times daily. 180 capsule 0   clonazePAM (KLONOPIN) 0.5 MG tablet Take 1 tablet (0.5 mg total) by mouth 3 (three) times daily as needed for anxiety. 90 tablet 2   flecainide (TAMBOCOR) 50 MG tablet TAKE 1 TABLET BY MOUTH TWICE A DAY 180 tablet 3   methylphenidate (RITALIN) 20 MG tablet Take 1 tablet (20 mg total) by mouth 3 (three) times daily with meals. 90 tablet 0   metoprolol succinate (TOPROL-XL) 25 MG 24 hr tablet Take 1 tablet (25 mg total) by mouth daily. 30 tablet 0   nabumetone (RELAFEN) 500 MG tablet Take 500 mg by mouth 3 (three) times daily.  3   nitroGLYCERIN (NITROSTAT) 0.4 MG SL tablet Place 1 tablet (0.4 mg total) under the tongue every 5 (five) minutes as needed for chest  pain. 25 tablet prn   omeprazole (PRILOSEC) 40 MG capsule TAKE ONE CAPSULE BY MOUTH EVERY MORNING 30 MINUTES BEFORE MEALS 90 capsule 0   oxyCODONE (OXY IR/ROXICODONE) 5 MG immediate release tablet oxycodone 5 mg tablet  TAKE 1 TABLET 3 TIMES A DAY BY ORAL ROUTE AS NEEDED.     pregabalin (LYRICA) 200 MG capsule Take 200 mg by mouth 3 (three) times daily.      triamcinolone ointment (KENALOG) 0.1 % Apply 1 application topically 2 (two) times daily. 60 g 5   EPINEPHrine (EPIPEN 2-PAK) 0.3 mg/0.3 mL IJ SOAJ injection Use for life threatening allergic reactions 2 each 1   fluticasone (FLONASE) 50 MCG/ACT nasal spray PLACE ONE SPRAY IN EACH NOSTRIL EVERY DAY 16 g 5   fluticasone (FLOVENT HFA) 220 MCG/ACT inhaler INHALE TWO PUFFS INTO THE LUNGS EVERY MORNING & AT BEDTIME 12 g 5   levalbuterol (XOPENEX HFA) 45 MCG/ACT inhaler INHALE TWO PUFFS INTO THE LUNGS EVERY 6 HOURS AS NEEDED FOR WHEEZING 15 g 2   levocetirizine (XYZAL) 5 MG tablet Take 1 tablet (5 mg total) by mouth every evening. 30 tablet 5   montelukast (SINGULAIR) 10 MG tablet Take 1 tablet (10 mg total) by mouth at bedtime. 30 tablet 5   Olopatadine HCl 0.2 % SOLN Place 1 drop into both eyes daily as needed. 2.5 mL 5   Current  Facility-Administered Medications  Medication Dose Route Frequency Provider Last Rate Last Admin   0.9 %  sodium chloride infusion  500 mL Intravenous Continuous Milus Banister, MD       Allergies: Allergies  Allergen Reactions   Eggs Or Egg-Derived Products Diarrhea   Latex Anaphylaxis, Hives and Itching    "Anaphylaxis is only when I am in a closed area (ex: car with balloons)"   Other Other (See Comments)    Sinus headache from new plastics, carpets, etc.   Codeine     Headaches    I reviewed her past medical history, social history, family history, and environmental history and no significant changes have been reported from previous visit on 03/29/2020.  Objective: Physical Exam Not obtained as encounter was done via telephone.   Previous notes and tests were reviewed.  I discussed the assessment and treatment plan with the patient. The patient was provided an opportunity to ask questions and all were answered. The patient agreed with the plan and demonstrated an understanding of the instructions.   The patient was advised to call back or seek an in-person evaluation if the symptoms worsen or if the condition fails to improve as anticipated.  I provided 40 minutes of non-face-to-face time during this encounter.  It was my pleasure to participate in [place] care today. Please feel free to contact me with any questions or concerns.   Sincerely,  Gareth Morgan, FNP

## 2021-06-26 NOTE — Patient Instructions (Addendum)
Moderate persistent asthma    Increase Flovent 220- to 2 puffs twice a day with a spacer to prevent cough or wheeze Continue montelukast 10 mg once a day to prevent cough or wheeze Continue Xopenex 2 puffs every 4-6 hours as needed for cough or wheeze  Seasonal and perennial allergic rhinitis (indoor molds, outdoor molds, cockroach, dust mites) Continue allergen avoidance measures as listed below Restart Xyzal 5 mg once a day as needed for a runny nose Continue Flonase 1 spray in each nostril once a day as needed for a stuffy nose.  In the right nostril, point the applicator out toward the right ear. In the left nostril, point the applicator out toward the left ear Consider saline nasal rinses as needed for nasal symptoms. Use this before any medicated nasal sprays for best result  Allergic conjunctivitis Continue Pataday one drop per eye once daily as needed for red or itchy eyes  Reflux Continue dietary and lifestyle modifications as listed below Continue omeprazole 40 mg once a day as previously prescribed. Take this medication 30 minutes before the first meal of the day for best results  History of allergy to stinging insect and fire ant bite Continue to avoid stinging insects and fire ants. In case of an allergic reaction, take Benadryl 50 mg every 4 hours, and if life-threatening symptoms occur, inject with EpiPen 0.3 mg.  Allergy to metal Continue to avoid triggering metals (nickel, chromium, copper sulfate)  Atopic dermatitis Continue a daily moisturizer For red itchy areas below your face, apply triamcinolone 0.1% ointment twice a day as needed  Call the clinic if this treatment plan is not working well for you.  Follow up in the clinic in 6 months or sooner if needed

## 2021-07-10 ENCOUNTER — Other Ambulatory Visit: Payer: Self-pay | Admitting: Family Medicine

## 2021-07-16 ENCOUNTER — Other Ambulatory Visit: Payer: Self-pay | Admitting: Cardiology

## 2021-07-17 ENCOUNTER — Other Ambulatory Visit: Payer: Self-pay

## 2021-07-17 ENCOUNTER — Telehealth (INDEPENDENT_AMBULATORY_CARE_PROVIDER_SITE_OTHER): Payer: Medicare Other | Admitting: Psychiatry

## 2021-07-17 DIAGNOSIS — F4310 Post-traumatic stress disorder, unspecified: Secondary | ICD-10-CM

## 2021-07-17 DIAGNOSIS — F331 Major depressive disorder, recurrent, moderate: Secondary | ICD-10-CM

## 2021-07-17 DIAGNOSIS — F900 Attention-deficit hyperactivity disorder, predominantly inattentive type: Secondary | ICD-10-CM

## 2021-07-17 MED ORDER — METHYLPHENIDATE HCL 20 MG PO TABS: 20.0000 mg | ORAL_TABLET | Freq: Three times a day (TID) | ORAL | 0 refills | Status: DC

## 2021-07-17 MED ORDER — VILAZODONE HCL 40 MG PO TABS: 40.0000 mg | ORAL_TABLET | Freq: Every day | ORAL | 0 refills | Status: DC

## 2021-07-17 MED ORDER — ATOMOXETINE HCL 40 MG PO CAPS: 40.0000 mg | ORAL_CAPSULE | Freq: Two times a day (BID) | ORAL | 0 refills | Status: DC

## 2021-07-17 MED ORDER — CLONAZEPAM 0.5 MG PO TABS: 0.5000 mg | ORAL_TABLET | Freq: Three times a day (TID) | ORAL | 2 refills | Status: DC | PRN

## 2021-07-17 NOTE — Progress Notes (Signed)
Virtual Visit via Telephone Note  I connected with Dawn Jordan on 07/17/21 at 10:00 AM EDT by telephone and verified that I am speaking with the correct person using two identifiers.  Location: Patient: home Provider: office   I discussed the limitations, risks, security and privacy concerns of performing an evaluation and management service by telephone and the availability of in person appointments. I also discussed with the patient that there may be a patient responsible charge related to this service. The patient expressed understanding and agreed to proceed.   History of Present Illness: Dawn Jordan shares that her depression is an ongoing stressor. Her pain level and weight gain contribute to her mood. She is an emotional eater and it is hard to control. She gets mad at herself when she is eating because she is not engaging in any exercise. Dawn Jordan needs to lose weight so that she can have knee replacement surgery. Dawn Jordan tried to do the [employer] loss program and felt is too expensive for the service provided. Dawn Jordan feels depressed every day. She has a lot of negative self thoughts and guilt. Dawn Jordan denies isolation and anhedonia. She is active in her community. Her sleep is poor and her energy is low. Her concentration is poor and she is overwhelmed. Dawn Jordan denies SI/HI.    Observations/Objective:  General Appearance: unable to assess  Eye Contact:  unable to assess  Speech:  Clear and Coherent and Normal Rate  Volume:  Normal  Mood:  Anxious and Depressed  Affect:  Congruent and Full Range  Thought Process:  Coherent and Descriptions of Associations: Circumstantial  Orientation:  Full (Time, Place, and Person)  Thought Content:  Logical  Suicidal Thoughts:  No  Homicidal Thoughts:  No  Memory:  Immediate;   Good  Judgement:  Good  Insight:  Good  Psychomotor Activity: unable to assess  Concentration:  Concentration: Good and Attention Span: Good  Recall:  Good  Fund of Knowledge:   Good  Language:  Good  Akathisia:  unable to assess  Handed:  unable to assess  AIMS (if indicated):     Assets:  Communication Skills Desire for Improvement Financial Resources/Insurance [place] Talents/Skills Transportation Vocational/Educational  ADL's:  unable to assess  Cognition:  WNL  Sleep:        Assessment and Plan: Depression screen [facility] 2/9 07/17/2021 04/24/2021 03/13/2021 12/26/2020 12/26/2019  Decreased Interest 1 0 0 0 3  Down, Depressed, Hopeless '3 1 1 1 3  '$ PHQ - 2 Score '4 1 1 1 6  '$ Altered sleeping 3 - - 0 3  Tired, decreased energy 3 - - 0 3  Change in appetite 3 - - 0 3  Feeling bad or failure about yourself  3 - - 0 3  Trouble concentrating 3 - - 0 3  Moving slowly or fidgety/restless 0 - - 0 3  Suicidal thoughts 0 - - 0 2  PHQ-9 Score 19 - - 1 26  Difficult doing work/chores Very difficult - - Not difficult at all Extremely dIfficult    Flowsheet Row Video Visit from 07/17/2021 in [facility] Video Visit from 04/24/2021 in [facility] Video Visit from 03/13/2021 in [facility] No Risk No Risk No Risk       - referred for therapy  1. MDD (major depressive disorder), recurrent episode, moderate (HCC) - Vilazodone HCl (VIIBRYD) 40 MG TABS; Take 1 tablet (  40 mg total) by mouth daily.  Dispense: 90 tablet; Refill: 0  2. Attention deficit hyperactivity disorder (ADHD), predominantly inattentive type - atomoxetine (STRATTERA) 40 MG capsule; Take 1 capsule (40 mg total) by mouth 2 (two) times daily.  Dispense: 180 capsule; Refill: 0 - methylphenidate (RITALIN) 20 MG tablet; Take 1 tablet (20 mg total) by mouth 3 (three) times daily with meals.  Dispense: 90 tablet; Refill: 0  3. PTSD (post-traumatic stress disorder) - clonazePAM (KLONOPIN) 0.5 MG tablet; Take 1 tablet (0.5 mg total) by  mouth 3 (three) times daily as needed for anxiety.  Dispense: 90 tablet; Refill: 2 - Vilazodone HCl (VIIBRYD) 40 MG TABS; Take 1 tablet (40 mg total) by mouth daily.  Dispense: 90 tablet; Refill: 0    Follow Up Instructions: In 2-3 months or sooner if needed   I discussed the assessment and treatment plan with the patient. The patient was provided an opportunity to ask questions and all were answered. The patient agreed with the plan and demonstrated an understanding of the instructions.   The patient was advised to call back or seek an in-person evaluation if the symptoms worsen or if the condition fails to improve as anticipated.  I provided 26 minutes of non-face-to-face time during this encounter.   Charlcie Cradle, MD

## 2021-07-24 ENCOUNTER — Other Ambulatory Visit: Payer: Self-pay | Admitting: Family Medicine

## 2021-07-29 ENCOUNTER — Other Ambulatory Visit: Payer: Self-pay | Admitting: Cardiology

## 2021-07-29 ENCOUNTER — Ambulatory Visit (INDEPENDENT_AMBULATORY_CARE_PROVIDER_SITE_OTHER): Payer: Medicare Other | Admitting: Clinical

## 2021-07-29 ENCOUNTER — Other Ambulatory Visit: Payer: Self-pay

## 2021-07-29 DIAGNOSIS — F32A Depression, unspecified: Secondary | ICD-10-CM | POA: Diagnosis not present

## 2021-07-29 DIAGNOSIS — F902 Attention-deficit hyperactivity disorder, combined type: Secondary | ICD-10-CM

## 2021-07-29 DIAGNOSIS — R632 Polyphagia: Secondary | ICD-10-CM

## 2021-07-29 DIAGNOSIS — F4310 Post-traumatic stress disorder, unspecified: Secondary | ICD-10-CM

## 2021-07-29 DIAGNOSIS — F419 Anxiety disorder, unspecified: Secondary | ICD-10-CM | POA: Diagnosis not present
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
## 2021-08-13 ENCOUNTER — Ambulatory Visit (INDEPENDENT_AMBULATORY_CARE_PROVIDER_SITE_OTHER): Payer: Medicare Other | Admitting: Clinical

## 2021-08-13 ENCOUNTER — Other Ambulatory Visit: Payer: Self-pay

## 2021-08-13 DIAGNOSIS — F32A Depression, unspecified: Secondary | ICD-10-CM

## 2021-08-13 DIAGNOSIS — R632 Polyphagia: Secondary | ICD-10-CM | POA: Diagnosis not present

## 2021-08-13 DIAGNOSIS — F4310 Post-traumatic stress disorder, unspecified: Secondary | ICD-10-CM | POA: Diagnosis not present

## 2021-08-13 DIAGNOSIS — F902 Attention-deficit hyperactivity disorder, combined type: Secondary | ICD-10-CM | POA: Diagnosis not present

## 2021-08-13 DIAGNOSIS — F419 Anxiety disorder, unspecified: Secondary | ICD-10-CM

## 2021-08-13 NOTE — Progress Notes (Signed)
   THERAPIST PROGRESS NOTE  Session Time: 11am  Participation Level: Active  Behavioral Response: Casual and NeatAlertNA  Type of Therapy: Individual Therapy  Treatment Goals addressed: Coping  Interventions: CBT  Summary: Dawn Jordan is a 55 y.o. female who appears to be in physical pain today. Pt states she is experiencing knee pain and arthritis. Pt discussed anxiety triggers and her family's dynamics. Pt reports her mother can increase her anxiety. 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 Pt states she has grown accustomed to caring for her mother but says it can be challenging. Pt reports having an brother but states him having difficulty managing his emotions. Additionally pt discussed her relationship with food and hx of binge eating. Pt states eating several non-nutritious meals throughout the day but reports she plan to look into joining the [employer] and participating in yoga.   Suicidal/Homicidal: Pt denies SI/HI no plan, intent or attempt to harm self or others reported.  Therapist Response: CSW provided pt with list of provider who specialize in treating eating disorders(Heather Kitchens 336 [phone]; [place] [id number]; [facility] [id number]; [employer]). CSW probed for feedback on anxiety triggers. CSW reviewed and provided pt with information on healthy vs unhealthy coping methods. CSW actively listened as pt discussed family dynamics and how it contributes to her anxiety.  Plan: Return again in 2 weeks.  Diagnosis: Axis I: PTSD    ADHD combined type    Binge eating    Anxiety and depression    Axis II: No diagnosis    Yvette Rack, LCSW 08/13/2021

## 2021-08-18 ENCOUNTER — Other Ambulatory Visit: Payer: Self-pay | Admitting: Cardiology

## 2021-08-22 ENCOUNTER — Other Ambulatory Visit: Payer: Self-pay | Admitting: Family Medicine

## 2021-08-22 DIAGNOSIS — K219 Gastro-esophageal reflux disease without esophagitis: Secondary | ICD-10-CM

## 2021-08-27 ENCOUNTER — Telehealth: Payer: Self-pay | Admitting: *Deleted

## 2021-08-27 ENCOUNTER — Other Ambulatory Visit: Payer: Self-pay | Admitting: Cardiology

## 2021-08-27 ENCOUNTER — Ambulatory Visit (HOSPITAL_COMMUNITY): Payer: Medicare Other | Admitting: Clinical

## 2021-08-27 NOTE — Telephone Encounter (Signed)
   Name: Dawn Jordan  DOB: 03/23/1966  MRN: [id number]  Primary Cardiologist: Candee Furbish, MD  Chart reviewed as part of pre-operative protocol coverage. Because of Dawn Jordan's past medical history and time since last visit, she will require a follow-up visit in order to better assess preoperative cardiovascular risk.  Last OV was 05/2020, >1 year ago. OK to see APP.  Pre-op covering staff: - Please schedule appointment and call patient to inform them.  - Please contact requesting surgeon's office via preferred method (i.e, phone, fax) to inform them of need for appointment prior to surgery.  No meds listed to hold at this time (not on [place] per chart).  Charlie Pitter, PA-C  08/27/2021, 10:17 AM

## 2021-08-27 NOTE — Telephone Encounter (Signed)
   [facility] Pre-operative Risk Assessment    Patient Name: Dawn Jordan  DOB: [birth year] MRN: [id number]  HEARTCARE STAFF:  - IMPORTANT!!!!!! Under Visit Info/Reason for Call, type in Other and utilize the format Clearance MM/DD/YY or Clearance TBD. Do not use dashes or single digits. - Please review there is not already an duplicate clearance open for this procedure. - If request is for dental extraction, please clarify the # of teeth to be extracted. - If the patient is currently at the dentist's office, call Pre-Op Callback Staff (MA/nurse) to input urgent request.  - If the patient is not currently in the dentist office, please route to the Pre-Op pool.  Request for surgical clearance:  What type of surgery is being performed?  RIGHT TOTAL KNEE ARTHROPLASTY    When is this surgery scheduled?  11/24/21  What type of clearance is required (medical clearance vs. Pharmacy clearance to hold med vs. Both)?  MEDICAL  Are there any medications that need to be held prior to surgery and how long?  N/A  Practice name and name of physician performing surgery?  [facility] / DR. ALLUSIO  What is the office phone number?  [phone]   7.   What is the office fax number?  [phone] ATTN:  KELLY  8.   Anesthesia type (None, local, MAC, general) ?  CHOICE   Dawn Jordan 08/27/2021, 6:59 AM  _________________________________________________________________   (provider comments below)

## 2021-08-27 NOTE — Telephone Encounter (Signed)
Pt has been scheduled to see Dr. Marlou Porch 11/05/2021 and clearance will be addressed at that time Will route back to the requesting surgeon's office to make them aware.

## 2021-09-01 ENCOUNTER — Telehealth: Payer: Self-pay | Admitting: Cardiology

## 2021-09-01 NOTE — Telephone Encounter (Signed)
Let's have her come in this Thursday 9/29 at [place], MD

## 2021-09-01 NOTE — Telephone Encounter (Signed)
The patient called in with chest pain, headache, left should pain, neck pain, and shortness of breath. She is not currently having these symptoms but they have occurred over the last week and not necessarily at the same time. She has no diaphoresis or N/V. Her heart rate is mostly in the 70's but on occasion she will see it in the low 100's but it does not stay elevated. She has a hx of Afib on Fle[place]. She has had prior cardiac work up that was normal. Patient states that is recently set up her knee replacement surgery and this is causing her a lot of anxiety and she is very scared to have it done. We discussed ways to help with anxiety with medication and none medication methods. She has not missed any doses of medication. Discussed used of PRN nitro and clonazepam if symptoms return. ED precautions given. Next OV 11/30 with Dr. Marlou Porch. Will forward to Dr. Marlou Porch for advisement.

## 2021-09-01 NOTE — Telephone Encounter (Signed)
Pt c/o of Chest Pain: STAT if CP now or developed within 24 hours  1. Are you having CP right now? Never answered this question  2. Are you experiencing any other symptoms (ex. SOB, nausea, vomiting, sweating)? headache, left shoulder pain, neck pain, shortness of breath.  3. How long have you been experiencing CP? For about a week  4. Is your CP continuous or coming and going? Coming and going   5. Have you taken Nitroglycerin? "I took one nitro, one day last week, ended up taking  two the next day"   Per message sent to scheduling from patient ^ ?

## 2021-09-01 NOTE — Progress Notes (Signed)
Cardiology Office Note:    Date:  09/04/2021   ID:  Dawn Jordan, DOB 04-02-1966, MRN [id number]  PCP:  Martinique, Betty G, MD  Cardiologist:  Candee Furbish, MD  Electrophysiologist:  None   Referring MD: Martinique, Betty G, MD     History of Present Illness:    Dawn Jordan is a 55 y.o. adult with a hx of palpitations, atrial fibrillation, anxiety and GERD. Dawn Jordan presents today for preop clearance. On 08/27/2021 telephone call, they reported chest pains. Today, [name] reports chest pain episodes that have been tight, burning and squeezing but mostly pressure. Episodes also spreads to Dawn Jordan's neck and arms and appears at rest, standing or going out. Dawn Jordan come on and off and last for about 10 - 20 minutes. Dawn Jordan includes these episodes had happened before but were not bothersome. Dawn Jordan states taking Prilosec 40 mg.   Dawn Jordan report when using to have the episodes back then, Dawn Jordan noticed them coming when eating [place] food as well. Dawn Jordan prior nurse told Dawn Jordan it might just be her anxiety. Dawn Jordan reports during the school years, Dawn Jordan would have panic attacks due to Dawn Jordan being bully or walking the mall by herself. [name] includes taking Klonopin 0.5 mg. [name] believes the anxiety was truly chest pain.   Dawn Jordan's mother was diagnosed with a heart disease that she had several blockages in her 12s-70s and had seizures. Dawn Jordan's brother had a heart attack at age 106.  Dawn Jordan signed up for the [employer] to help less tension.   Dawn Jordan currently does not drink or smoke.   They denies palpitations, lightheadedness, LE edema, NVD, dizziness, shortness of breath or syncope.   Past Medical History:  Diagnosis Date   A-fib [facility])    ADD (attention deficit disorder)    Anemia    Anginal pain (HCC)    Anxiety    Asthma    Back pain    Bone spur of foot    Chest pain    Chronic fatigue syndrome    COPD (chronic obstructive pulmonary disease) (HCC)    Depression    Dyspnea    Fibromyalgia    GERD (gastroesophageal reflux  disease)    Headache    "weekly-monthly" (06/04/2017)   History of gout    History of kidney stones    Hypertension    IBS (irritable bowel syndrome)    Joint pain    Left ankle pain    Left sciatic nerve pain    Myofascial pain    Neck pain    OSA (obstructive sleep apnea)    "getting ready to retest" (06/04/2017)   Palpitations    Rheumatoid arthritis (HCC)    Seasonal allergies     Past Surgical History:  Procedure Laterality Date   CARPAL TUNNEL RELEASE Left    CYST EXCISION     "little cysts taken off both hands and left arm" (06/04/2017)   KNEE ARTHROSCOPY Bilateral    "3 on my left; 2 on my right" (06/04/2017)   LAPAROSCOPIC CHOLECYSTECTOMY     TONSILLECTOMY      Current Medications: Current Meds  Medication Sig   ACTEMRA 162 MG/0.9ML SOSY every 7 (seven) days.    atomoxetine (STRATTERA) 40 MG capsule Take 1 capsule (40 mg total) by mouth 2 (two) times daily.   clonazePAM (KLONOPIN) 0.5 MG tablet Take 1 tablet (0.5 mg total) by mouth 3 (three) times daily as needed for anxiety.   EPINEPHrine (EPIPEN 2-PAK) 0.3 mg/0.3 mL IJ SOAJ injection Use  for life threatening allergic reactions   fluticasone (FLONASE) 50 MCG/ACT nasal spray PLACE ONE SPRAY IN EACH NOSTRIL EVERY DAY   fluticasone (FLOVENT HFA) 220 MCG/ACT inhaler INHALE TWO PUFFS INTO THE LUNGS EVERY MORNING & AT BEDTIME   levalbuterol (XOPENEX HFA) 45 MCG/ACT inhaler INHALE TWO PUFFS INTO THE LUNGS EVERY 6 HOURS AS NEEDED FOR WHEEZING   levocetirizine (XYZAL) 5 MG tablet Take 1 tablet (5 mg total) by mouth every evening.   methylphenidate (RITALIN) 20 MG tablet Take 1 tablet (20 mg total) by mouth 3 (three) times daily with meals.   montelukast (SINGULAIR) 10 MG tablet TAKE 1 TABLET BY MOUTH EVERYDAY AT BEDTIME   nabumetone (RELAFEN) 500 MG tablet Take 500 mg by mouth 3 (three) times daily.   nitroGLYCERIN (NITROSTAT) 0.4 MG SL tablet Place 1 tablet (0.4 mg total) under the tongue every 5 (five) minutes as needed for  chest pain.   Olopatadine HCl 0.2 % SOLN Place 1 drop into both eyes daily as needed.   omeprazole (PRILOSEC) 40 MG capsule TAKE ONE CAPSULE BY MOUTH EVERY MORNING 30 MINUTES BEFORE MEALS   oxyCODONE (OXY IR/ROXICODONE) 5 MG immediate release tablet oxycodone 5 mg tablet  TAKE 1 TABLET 3 TIMES A DAY BY ORAL ROUTE AS NEEDED.   pregabalin (LYRICA) 200 MG capsule Take 200 mg by mouth 3 (three) times daily.    triamcinolone ointment (KENALOG) 0.1 % Apply 1 application topically 2 (two) times daily.   Vilazodone HCl (VIIBRYD) 40 MG TABS Take 1 tablet (40 mg total) by mouth daily.   [DISCONTINUED] flecainide (TAMBOCOR) 50 MG tablet TAKE 1 TABLET BY MOUTH TWICE A DAY   [DISCONTINUED] metoprolol succinate (TOPROL-XL) 25 MG 24 hr tablet Take 1 tablet (25 mg total) by mouth daily. Please make overdue appt with Dr Marlou Porch for future refills Thank you 1st attempt   Current Facility-Administered Medications for the 09/04/21 encounter (Office Visit) with Jerline Pain, MD  Medication   0.9 %  sodium chloride infusion     Allergies:   Eggs or egg-derived products, Latex, Other, and Codeine   Social History   Socioeconomic History   Marital status: Married    Spouse name: Aricka Goldberger   Number of children: 0   Years of education: Not on file   Highest education level: Not on file  Occupational History   Occupation: not employed-disabled  Tobacco Use   Smoking status: Never   Smokeless tobacco: Never  Vaping Use   Vaping Use: Never used  Substance and Sexual Activity   Alcohol use: No   Drug use: No   Sexual activity: Never  Other Topics Concern   Not on file  Social History Narrative   Born in [place], [place]"   Unemployed, on disability since 1991 for anxiety,    Education: Biomedical engineer   Single, no children, lives with her friend (used to live with her mother with stroke, now in ALF)      Social Determinants of Health   Financial Resource Strain: Low Risk     Difficulty of Paying Living Expenses: Not hard at all  Food Insecurity: No Food Insecurity   Worried About Charity fundraiser in the Last Year: Never true   Arboriculturist in the Last Year: Never true  Transportation Needs: No Transportation Needs   Lack of Transportation (Medical): No   Lack of Transportation (Non-Medical): No  Physical Activity: Inactive   Days of Exercise per Week: 0  days   Minutes of Exercise per Session: 0 min  Stress: No Stress Concern Present   Feeling of Stress : Not at all  Social Connections: Moderately Integrated   Frequency of Communication with Friends and Family: More than three times a week   Frequency of Social Gatherings with Friends and Family: More than three times a week   Attends Religious Services: Never   Marine scientist or Organizations: Yes   Attends Archivist Meetings: Never   Marital Status: Married     Family History: The patient's family history includes Allergic rhinitis in [place] Law's brother and mother; Anxiety disorder in [place] Bastarache's mother; Arthritis in [place] Mcclenton's mother; Bipolar disorder in [place] Mczeal's mother; Cancer in [place] S[place]'s paternal grandmother; Colon cancer in [place] Binstock's paternal aunt; Depression in [place] Monnig's mother; Diabetes in [place] Ram's maternal grandfather, mother, and paternal grandmother; Eating disorder in [place] Ramaker's mother; Heart disease in [place] Wiltsie's mother; Hyperlipidemia in [place] Pai's mother; Hypertension in [place]'s maternal grandfather, mother, and paternal grandmother; Lung cancer in [place] Wurzel's maternal uncle and another family member; Mental retardation in [place] Javier's mother; Sleep apnea in [place] Mccuen's mother; Stroke in [place] Profit's mother; Thyroid disease in [place] Pinzon's mother. There is no history of Asthma, Eczema, Immunodeficiency, Urticaria, Atopy, Angioedema, Esophageal cancer, Stomach cancer, or Rectal cancer.  ROS:   Please see the history of  present illness.    (+) chest pain  All other systems reviewed and are negative.  EKGs/Labs/Other Studies Reviewed:    The following studies were reviewed today: ETT 08/05/23:  There was no ST segment deviation noted during stress. The patient walked for 1 minutes and 23 seconds of a standard Bruce protocol treadmill test. She achieved a peak heart rate of 116 which is 69% predicted maximal heart rate. She stopped due to inability to keep up with the treadmill. At peak exercise there were no ST or T wave changes to suggest ischemia. There was also no evidence of QRS widening to suggest flecainide toxicity. This is interpreted as a nondiagnostic exercise test with regard to the question of ischemia. There was no evidence of QRS widening or flecainide toxicity at her maximal exertional effort.  LONG TERM MONITOR 06/2019:  2 hours of Atrial fibrillation noted at night with rapid ventricular response Occasional PAC's, premature atrial contractions, and PAT, paroxsymal atrial tachycardia (with brief abberency noted-benign) No pauses   Given CHADSVASC of 1 (Female) she does not require anticoagulation at this time. No CAD on CT of coronaries I would like to try her on Flecainide 50mg  PO BID, with Toprol 25mg  PO QD to help suppress atrial fibrillation.  Normal EF Please have her come in for treadmill, ETT 1-2 weeks after starting flecainide.  Also, let's set her up for a sleep study (AFIB on monitor happened at night)  Have her come back in and see me in 4 weeks.   CORONARY CT 06/2019:  IMPRESSION: 1. Coronary calcium score of 0. This was 0 percentile for age and sex matched control.   2. Normal coronary origin with right dominance.   3. No evidence of CAD.  CAD-RADS 0.   LEXISCAN 03/20:  The left ventricular ejection fraction is normal (55-65%). Nuclear stress EF: 61%. No T wave inversion was noted during stress. There was no ST segment deviation noted during stress. This is a low  risk study. Defect 1: There is a small defect of mild severity at the apex   No  reversible ischemia. Small fixed apical artifact, previously seen in 2016. LVEF 61% with normal wall motion. This is a low risk study.  ECHO 06/18:  Impressions:  - Mild LVH with LVEF approximately 55% and normal diastolic    function. Mildly calcified mitral annulus with mild mitral    regurgitation. Mild aortic regurgitation. Trivial tricuspid    regurgitation with PASP 21 mmHg.   EKG:   09/22: sinus bradycardia, HR 57 bpm, non-specific T-wave flattening  Recent Labs: No results found for requested labs within last 8760 hours.  Recent Lipid Panel    Component Value Date/Time   CHOL 202 (H) 12/26/2019 1257   TRIG 198 (H) 12/26/2019 1257   HDL 42 12/26/2019 1257   CHOLHDL 5.2 06/05/2017 0129   VLDL 54 (H) 06/05/2017 0129   LDLCALC 125 (H) 12/26/2019 1257    Physical Exam:    VS:  BP 110/70 (BP Location: Left Arm, Patient Position: Sitting, Cuff Size: Normal)   Pulse 60   Ht 5\' 4"  (1.626 m)   Wt 240 lb (108.9 kg)   LMP  (LMP Unknown)   SpO2 98%   BMI 41.20 kg/m     Wt Readings from Last 3 Encounters:  09/04/21 240 lb (108.9 kg)  06/26/21 240 lb (108.9 kg)  01/29/21 241 lb (109.3 kg)     GEN:  Well nourished, well developed in no acute distress HEENT: Normal NECK: No JVD; No carotid bruits LYMPHATICS: No lymphadenopathy CARDIAC: RRR, no murmurs, rubs, gallops RESPIRATORY:  Clear to auscultation without rales, wheezing or rhonchi  ABDOMEN: Soft, non-tender, non-distended MUSCULOSKELETAL:  No edema; No deformity  SKIN: Warm and dry NEUROLOGIC:  Alert and oriented x 3 PSYCHIATRIC:  Normal affect   ASSESSMENT:    1. Essential hypertension   2. Chest pain of uncertain etiology   3. Preop cardiovascular exam    PLAN:    Chest pain of uncertain etiology Overall reassuring ECG today, no changes from prior.  Coronary CT scan performed 2 years ago showed a coronary calcium score of 0  and no coronary artery disease by contrast.  Excellent.  She does have a brother who in his 39s had a myocardial infarction after playing hockey.  Thankfully with her calcium score of 0 and her contrasted study showing no CAD this is very reassuring especially over the next 3 to 5 years.  I do not think that any further testing is warranted.  Her chest discomfort certainly could be musculoskeletal, even exacerbated by different anxieties.  Continue with Toprol for potential microvascular disease.  No other cardiac work-up at this time.  She is also had a stress test 2 years ago as well that was normal.  Her pain is nonexertional.  She does take Prilosec as a PPI.  Preop cardiovascular exam She may proceed with knee replacement with low overall cardiac risk.  Cardiac testing was excellent in 2020.  FOLLOW UP IN 1 YEAR  Medication Adjustments/Labs and Tests Ordered: Current medicines are reviewed at length with the patient today.  Concerns regarding medicines are outlined above.  Orders Placed This Encounter  Procedures   EKG 12-Lead   Meds ordered this encounter  Medications   metoprolol succinate (TOPROL-XL) 25 MG 24 hr tablet    Sig: Take 1 tablet (25 mg total) by mouth daily.    Dispense:  90 tablet    Refill:  3   flecainide (TAMBOCOR) 50 MG tablet    Sig: Take 1 tablet (50 mg total) by mouth  2 (two) times daily.    Dispense:  180 tablet    Refill:  3    Patient Instructions  Medication Instructions:  The current medical regimen is effective;  continue present plan and medications.  *If you need a refill on your cardiac medications before your next appointment, please call your pharmacy*  Follow-Up: At [facility], you and your health needs are our priority.  As part of our continuing mission to provide you with exceptional heart care, we have created designated Provider Care Teams.  These Care Teams include your primary Cardiologist (physician) and Advanced Practice Providers  (APPs -  Physician Assistants and Nurse Practitioners) who all work together to provide you with the care you need, when you need it.  We recommend signing up for the patient portal called "MyChart".  Sign up information is provided on this After Visit Summary.  MyChart is used to connect with patients for Virtual Visits (Telemedicine).  Patients are able to view lab/test results, encounter notes, upcoming appointments, etc.  Non-urgent messages can be sent to your provider as well.   To learn more about what you can do with MyChart, go to [email or web address].    Your next appointment:   1 year(s)  The format for your next appointment:   In Person  Provider:   Candee Furbish, MD  Thank you for choosing [facility]!!      I,Jada Bradford,acting as a scribe for Candee Furbish, MD.,have documented all relevant documentation on the behalf of Candee Furbish, MD,as directed by  Candee Furbish, MD while in the presence of Candee Furbish, MD.  I, Candee Furbish, MD, have reviewed all documentation for this visit. The documentation on 09/04/21 for the exam, diagnosis, procedures, and orders are all accurate and complete.   Signed, Candee Furbish, MD  09/04/2021 9:25 AM    [facility]

## 2021-09-02 NOTE — Telephone Encounter (Signed)
Pt has been scheduled for appt as ordered by Dr Marlou Porch per Marva Panda.

## 2021-09-04 ENCOUNTER — Encounter: Payer: Self-pay | Admitting: Cardiology

## 2021-09-04 ENCOUNTER — Other Ambulatory Visit: Payer: Self-pay

## 2021-09-04 ENCOUNTER — Ambulatory Visit (INDEPENDENT_AMBULATORY_CARE_PROVIDER_SITE_OTHER): Payer: Medicare Other | Admitting: Cardiology

## 2021-09-04 VITALS — BP 110/70 | HR 60 | Ht 64.0 in | Wt 240.0 lb

## 2021-09-04 DIAGNOSIS — I10 Essential (primary) hypertension: Secondary | ICD-10-CM

## 2021-09-04 DIAGNOSIS — R079 Chest pain, unspecified: Secondary | ICD-10-CM | POA: Diagnosis not present

## 2021-09-04 DIAGNOSIS — Z01810 Encounter for preprocedural cardiovascular examination: Secondary | ICD-10-CM

## 2021-09-04 MED ORDER — FLECAINIDE ACETATE 50 MG PO TABS: 50.0000 mg | ORAL_TABLET | Freq: Two times a day (BID) | ORAL | 3 refills | Status: DC

## 2021-09-04 MED ORDER — METOPROLOL SUCCINATE ER 25 MG PO TB24
25.0000 mg | ORAL_TABLET | Freq: Every day | ORAL | 3 refills | Status: DC
Start: 2021-09-04 — End: 2022-08-27

## 2021-09-04 NOTE — Assessment & Plan Note (Signed)
Overall reassuring ECG today, no changes from prior.  Coronary CT scan performed 2 years ago showed a coronary calcium score of 0 and no coronary artery disease by contrast.  Excellent.  She does have a brother who in his 75s had a myocardial infarction after playing hockey.  Thankfully with her calcium score of 0 and her contrasted study showing no CAD this is very reassuring especially over the next 3 to 5 years.  I do not think that any further testing is warranted.  Her chest discomfort certainly could be musculoskeletal, even exacerbated by different anxieties.  Continue with Toprol for potential microvascular disease.  No other cardiac work-up at this time.  She is also had a stress test 2 years ago as well that was normal.  Her pain is nonexertional.  She does take Prilosec as a PPI.

## 2021-09-04 NOTE — Patient Instructions (Signed)
Medication Instructions:  The current medical regimen is effective;  continue present plan and medications.  *If you need a refill on your cardiac medications before your next appointment, please call your pharmacy*  Follow-Up: At [facility], you and your health needs are our priority.  As part of our continuing mission to provide you with exceptional heart care, we have created designated Provider Care Teams.  These Care Teams include your primary Cardiologist (physician) and Advanced Practice Providers (APPs -  Physician Assistants and Nurse Practitioners) who all work together to provide you with the care you need, when you need it.  We recommend signing up for the patient portal called "MyChart".  Sign up information is provided on this After Visit Summary.  MyChart is used to connect with patients for Virtual Visits (Telemedicine).  Patients are able to view lab/test results, encounter notes, upcoming appointments, etc.  Non-urgent messages can be sent to your provider as well.   To learn more about what you can do with MyChart, go to [email or web address].    Your next appointment:   1 year(s)  The format for your next appointment:   In Person  Provider:   Mark Skains, MD   Thank you for choosing [facility]!!    

## 2021-09-04 NOTE — Assessment & Plan Note (Signed)
She may proceed with knee replacement with low overall cardiac risk.  Cardiac testing was excellent in 2020.

## 2021-09-18 ENCOUNTER — Telehealth: Payer: Self-pay | Admitting: Family Medicine

## 2021-09-18 DIAGNOSIS — Z79899 Other long term (current) drug therapy: Secondary | ICD-10-CM | POA: Diagnosis not present

## 2021-09-18 DIAGNOSIS — M0609 Rheumatoid arthritis without rheumatoid factor, multiple sites: Secondary | ICD-10-CM | POA: Diagnosis not present

## 2021-09-18 NOTE — Telephone Encounter (Signed)
Patient called to follow up with paperwork that had been dropped off yesterday 10/12. Patient states she hadn't received a call yet. I let patient know that teamcare and Dr.Jordan were out of the office today and they would give a call when paperwork is done as previously stated when paperwork was dropped off.      Good callback number is [phone]

## 2021-09-19 NOTE — Telephone Encounter (Signed)
Please let her know I do not fill handicap stickers. She can request it from her pain management provider, he/she can do so if she/he feels it is appropriate. Thanks, BJ

## 2021-09-19 NOTE — Telephone Encounter (Signed)
Pt is calling checking on handicap form she dropped off. Pt is aware can take up to 5 business days

## 2021-09-22 NOTE — Telephone Encounter (Signed)
Patient notified of update  and verbalized understanding. 

## 2021-10-02 ENCOUNTER — Other Ambulatory Visit: Payer: Self-pay

## 2021-10-02 ENCOUNTER — Ambulatory Visit (HOSPITAL_BASED_OUTPATIENT_CLINIC_OR_DEPARTMENT_OTHER): Payer: Medicare Other | Admitting: Psychiatry

## 2021-10-02 DIAGNOSIS — F900 Attention-deficit hyperactivity disorder, predominantly inattentive type: Secondary | ICD-10-CM | POA: Diagnosis not present

## 2021-10-02 DIAGNOSIS — F4310 Post-traumatic stress disorder, unspecified: Secondary | ICD-10-CM | POA: Diagnosis not present

## 2021-10-02 DIAGNOSIS — F331 Major depressive disorder, recurrent, moderate: Secondary | ICD-10-CM | POA: Diagnosis not present

## 2021-10-02 MED ORDER — CLONAZEPAM 0.5 MG PO TABS: 0.5000 mg | ORAL_TABLET | Freq: Three times a day (TID) | ORAL | 2 refills | Status: DC | PRN

## 2021-10-02 MED ORDER — ATOMOXETINE HCL 60 MG PO CAPS: 60.0000 mg | ORAL_CAPSULE | Freq: Every day | ORAL | 0 refills | Status: DC

## 2021-10-02 MED ORDER — ATOMOXETINE HCL 40 MG PO CAPS: 40.0000 mg | ORAL_CAPSULE | Freq: Every day | ORAL | 0 refills | Status: DC

## 2021-10-02 MED ORDER — VILAZODONE HCL 40 MG PO TABS: 40.0000 mg | ORAL_TABLET | Freq: Every day | ORAL | 0 refills | Status: DC

## 2021-10-02 NOTE — Progress Notes (Signed)
Virtual Visit via Telephone Note  I connected with Dawn Jordan on 10/02/21 at  1:30 PM EDT by telephone and verified that I am speaking with the correct person using two identifiers.  Location: Patient: in car Provider: office   I discussed the limitations, risks, security and privacy concerns of performing an evaluation and management service by telephone and the availability of in person appointments. I also discussed with the patient that there may be a patient responsible charge related to this service. The patient expressed understanding and agreed to proceed.   History of Present Illness: Dawn Jordan is currently taking her wife to her surgery follow up appointment. They are stressed about her recovery. Depression is "not to bad" but she is having more panic attacks. It is happening abouot 3x/week. She was experiencing chest pain. She went to her cardiologist and was told her "heart looks great". The Klonopin does help. She is having knee replacement surgery in December. She had really wanted to lose weight to prepare. Dawn Jordan had made significant diet changes but gave up when her doctor's office scale did not match her scale at home. She gave up for a while but plans to start again on November 1st because she is worried about developing diabetes. At times it makes her feel hopeless about the weight loss. Dawn Jordan denies other hopelessness and anhedonia. She denies SI/HI. This week she was triggered by some of the behaviors of her toddler godson. She is working on correcting his behavior. Her sleep is good and she has a 1-2 nightmares but they were not overwhelming. She is taking Strattera and it recommended by her cardiologist that she stop Ritalin due to the A. Fib.    Observations/Objective:  General Appearance: unable to assess  Eye Contact:  unable to assess  Speech:  Clear and Coherent and Normal Rate  Volume:  Normal  Mood:  Anxious and Depressed  Affect:  Full Range  Thought Process:  Coherent  and Descriptions of Associations: Circumstantial  Orientation:  Full (Time, Place, and Person)  Thought Content:  Logical  Suicidal Thoughts:  No  Homicidal Thoughts:  No  Memory:  Immediate;   Good  Judgement:  Good  Insight:  Good  Psychomotor Activity: unable to assess  Concentration:  Concentration: Good  Recall:  Good  Fund of Knowledge:  Good  Language:  Good  Akathisia:  unable to assess  Handed:  unable to assess  AIMS (if indicated):     Assets:  Communication Skills Desire for Improvement Financial Resources/Insurance [name] Talents/Skills Transportation Vocational/Educational  ADL's:  unable to assess  Cognition:  WNL  Sleep:         Assessment and Plan: Depression screen [facility] 2/9 10/02/2021 07/29/2021 07/17/2021 04/24/2021 03/13/2021  Decreased Interest 0 0 1 0 0  Down, Depressed, Hopeless 1 0 3 1 1   PHQ - 2 Score 1 0 4 1 1   Altered sleeping - 1 3 - -  Tired, decreased energy - 1 3 - -  Change in appetite - 3 3 - -  Feeling bad or failure about yourself  - 3 3 - -  Trouble concentrating - 3 3 - -  Moving slowly or fidgety/restless - 1 0 - -  Suicidal thoughts - 0 0 - -  PHQ-9 Score - 12 19 - -  Difficult doing work/chores - Somewhat difficult Very difficult - -  Some recent data might be hidden   [place] Office Visit from 10/02/2021 in  [facility] Counselor from 07/29/2021 in [facility] Video Visit from 07/17/2021 in [facility]  C-SSRS RISK CATEGORY No Risk No Risk No Risk      D/c Ritalin, as recommended by her cardiologist due to A. Fib  1. Attention deficit hyperactivity disorder (ADHD), predominantly inattentive type - atomoxetine (STRATTERA) 60 MG capsule; Take 1 capsule (60 mg total) by mouth daily.  Dispense: 90 capsule; Refill: 0 - atomoxetine (STRATTERA) 40 MG capsule; Take 1 capsule (40  mg total) by mouth daily with lunch.  Dispense: 90 capsule; Refill: 0  2. PTSD (post-traumatic stress disorder) - clonazePAM (KLONOPIN) 0.5 MG tablet; Take 1 tablet (0.5 mg total) by mouth 3 (three) times daily as needed for anxiety.  Dispense: 90 tablet; Refill: 2 - Vilazodone HCl (VIIBRYD) 40 MG TABS; Take 1 tablet (40 mg total) by mouth daily.  Dispense: 90 tablet; Refill: 0  3. MDD (major depressive disorder), recurrent episode, moderate (HCC) - Vilazodone HCl (VIIBRYD) 40 MG TABS; Take 1 tablet (40 mg total) by mouth daily.  Dispense: 90 tablet; Refill: 0   Follow Up Instructions: In 2-3 months or sooner if needed   I discussed the assessment and treatment plan with the patient. The patient was provided an opportunity to ask questions and all were answered. The patient agreed with the plan and demonstrated an understanding of the instructions.   The patient was advised to call back or seek an in-person evaluation if the symptoms worsen or if the condition fails to improve as anticipated.  I provided 22 minutes of non-face-to-face time during this encounter.   Charlcie Cradle, MD

## 2021-10-13 DIAGNOSIS — M1711 Unilateral primary osteoarthritis, right knee: Secondary | ICD-10-CM | POA: Diagnosis not present

## 2021-11-04 ENCOUNTER — Telehealth: Payer: Self-pay

## 2021-11-04 DIAGNOSIS — M1711 Unilateral primary osteoarthritis, right knee: Secondary | ICD-10-CM | POA: Diagnosis not present

## 2021-11-04 NOTE — Telephone Encounter (Signed)
Patient needs a surgery clearance appointment, can you help her schedule it? Thank you!

## 2021-11-04 NOTE — H&P (Addendum)
TOTAL KNEE ADMISSION H&P  Patient is being admitted for right total knee arthroplasty.  Subjective:  Chief Complaint: Right knee pain.  HPI: Dawn Jordan, 55 y.o. nonbinary has a history of pain and functional disability in the right knee due to arthritis and has failed non-surgical conservative treatments for greater than 12 weeks to include corticosteriod injections and activity modification. Onset of symptoms was gradual, starting  several  years ago with gradually worsening course since that time. The patient noted no past surgery on the right knee.  Patient currently rates pain in the right knee at 7 out of 10 with activity. Patient has night pain, worsening of pain with activity and weight bearing, pain with passive range of motion, and crepitus. Patient has evidence of  bone-on-bone arthritis in the medial and patellofemoral compartments with marginal osteophyte formation  by imaging studies. There is no active infection.  Patient Active Problem List   Diagnosis Date Noted   Preop cardiovascular exam 09/04/2021   Primary osteoarthritis of both knees 04/29/2020   Morbid (severe) obesity due to excess calories ([place]) 12/04/2019   Myofascial pain dysfunction syndrome 08/31/2019   Rheumatoid arthritis involving multiple sites with positive rheumatoid factor ([place]) 08/31/2019   Persistent atrial fibrillation ([place]) 08/31/2019   History of insect sting allergy 10/21/2018   Fire ant bite 10/21/2018   Seasonal and perennial allergic rhinitis 10/21/2018   Allergic contact dermatitis due to metals 10/21/2018   Asthma 06/04/2017   Chest pain of uncertain etiology 09/73/5329   Special screening for malignant neoplasms, colon 04/06/2017   Gastroesophageal reflux disease 04/06/2017   Abdominal pain, epigastric 04/06/2017   Chronic diarrhea 04/06/2017   OSA (obstructive sleep apnea) 03/30/2017   Major depressive disorder, single episode, moderate ([place]) 03/09/2017   Moderate persistent asthma  without complication 92/42/6834   History of food allergy 01/28/2017   Dermatitis, contact 01/28/2017   Allergic rhinitis 12/22/2016   Fibromyalgia syndrome 09/21/2016   Attention deficit hyperactivity disorder (ADHD) 09/21/2016   PTSD (post-traumatic stress disorder) 09/21/2016   Chronic rheumatic arthritis ([place]) 12/07/2014    Past Medical History:  Diagnosis Date   A-fib ([place])    ADD (attention deficit disorder)    Anemia    Anginal pain (H[facility])    Anxiety    Asthma    Back pain    Bone spur of foot    Chest pain    Chronic fatigue syndrome    COPD (chronic obstructive pulmonary disease) (H[facility])    Depression    Dyspnea    Fibromyalgia    GERD (gastroesophageal reflux disease)    Headache    "weekly-monthly" (06/04/2017)   History of gout    History of kidney stones    Hypertension    IBS (irritable bowel syndrome)    Joint pain    Left ankle pain    Left sciatic nerve pain    Myofascial pain    Neck pain    OSA (obstructive sleep apnea)    "getting ready to retest" (06/04/2017)   Palpitations    Rheumatoid arthritis (HCC)    Seasonal allergies     Past Surgical History:  Procedure Laterality Date   CARPAL TUNNEL RELEASE Left    CYST EXCISION     "little cysts taken off both hands and left arm" (06/04/2017)   KNEE ARTHROSCOPY Bilateral    "3 on my left; 2 on my right" (06/04/2017)   LAPAROSCOPIC CHOLECYSTECTOMY     TONSILLECTOMY      Prior to  Admission medications   Medication Sig Start Date End Date Taking? Authorizing Provider  ACTEMRA 162 MG/0.9ML SOSY every 7 (seven) days.  10/17/18   [provider]  atomoxetine (STRATTERA) 40 MG capsule Take 1 capsule (40 mg total) by mouth daily with lunch. 10/02/21 10/02/22  Charlcie Cradle, MD  atomoxetine (STRATTERA) 60 MG capsule Take 1 capsule (60 mg total) by mouth daily. 10/02/21   Charlcie Cradle, MD  clonazePAM (KLONOPIN) 0.5 MG tablet Take 1 tablet (0.5 mg total) by mouth 3 (three) times daily as needed  for anxiety. 10/02/21   Charlcie Cradle, MD  EPINEPHrine (EPIPEN 2-PAK) 0.3 mg/0.3 mL IJ SOAJ injection Use for life threatening allergic reactions 06/26/21   Ambs, Kathrine Cords, FNP  flecainide (TAMBOCOR) 50 MG tablet Take 1 tablet (50 mg total) by mouth 2 (two) times daily. 09/04/21   Jerline Pain, MD  fluticasone (FLONASE) 50 MCG/ACT nasal spray PLACE ONE SPRAY IN EACH NOSTRIL EVERY DAY 06/26/21   Ambs, Kathrine Cords, FNP  fluticasone (FLOVENT HFA) 220 MCG/ACT inhaler INHALE TWO PUFFS INTO THE LUNGS EVERY MORNING & AT BEDTIME 08/22/21   Ambs, Kathrine Cords, FNP  levalbuterol [facility] HFA) 45 MCG/ACT inhaler INHALE TWO PUFFS INTO THE LUNGS EVERY 6 HOURS AS NEEDED FOR WHEEZING 08/22/21   Ambs, Kathrine Cords, FNP  levocetirizine (XYZAL) 5 MG tablet Take 1 tablet (5 mg total) by mouth every evening. 06/26/21   Ambs, Kathrine Cords, FNP  metoprolol succinate (TOPROL-XL) 25 MG 24 hr tablet Take 1 tablet (25 mg total) by mouth daily. 09/04/21   Jerline Pain, MD  montelukast (SINGULAIR) 10 MG tablet TAKE 1 TABLET BY MOUTH EVERYDAY AT BEDTIME 07/25/21   Ambs, Kathrine Cords, FNP  nabumetone (RELAFEN) 500 MG tablet Take 500 mg by mouth 3 (three) times daily. 07/09/18   [provider]  nitroGLYCERIN (NITROSTAT) 0.4 MG SL tablet Place 1 tablet (0.4 mg total) under the tongue every 5 (five) minutes as needed for chest pain. 01/24/20   Jerline Pain, MD  Olopatadine HCl 0.2 % SOLN Place 1 drop into both eyes daily as needed. 06/26/21   Dara Hoyer, FNP  omeprazole (PRILOSEC) 40 MG capsule TAKE ONE CAPSULE BY MOUTH EVERY MORNING 30 MINUTES BEFORE MEALS 08/22/21   Martinique, Betty G, MD  oxyCODONE (OXY IR/ROXICODONE) 5 MG immediate release tablet oxycodone 5 mg tablet  TAKE 1 TABLET 3 TIMES A DAY BY ORAL ROUTE AS NEEDED.    [provider]  pregabalin (LYRICA) 200 MG capsule Take 200 mg by mouth 3 (three) times daily.     [provider]  triamcinolone ointment (KENALOG) 0.1 % Apply 1 application topically 2 (two) times daily. 03/29/20    Dara Hoyer, FNP  Vilazodone HCl (VIIBRYD) 40 MG TABS Take 1 tablet (40 mg total) by mouth daily. 10/02/21   Charlcie Cradle, MD    Allergies  Allergen Reactions   Eggs Or Egg-Derived Products Diarrhea   Latex Anaphylaxis, Hives and Itching    "Anaphylaxis is only when I am in a closed area (ex: car with balloons)"   Other Other (See Comments)    Sinus headache from new plastics, carpets, etc.   Codeine     Headaches     Social History   Socioeconomic History   Marital status: Married    Spouse name: Aza Dantes   Number of children: 0   Years of education: Not on file   Highest education level: Not on file  Occupational History  Occupation: not employed-disabled  Tobacco Use   Smoking status: Never   Smokeless tobacco: Never  Vaping Use   Vaping Use: Never used  Substance and Sexual Activity   Alcohol use: No   Drug use: No   Sexual activity: Never  Other Topics Concern   Not on file  Social History Narrative   Born in [place], [place]"   Unemployed, on disability since 1991 for anxiety,    Education: Biomedical engineer   Single, no children, lives with her friend (used to live with her mother with stroke, now in ALF)      Social Determinants of Health   Financial Resource Strain: Low Risk    Difficulty of Paying Living Expenses: Not hard at all  Food Insecurity: No Food Insecurity   Worried About Charity fundraiser in the Last Year: Never true   Arboriculturist in the Last Year: Never true  Transportation Needs: No Transportation Needs   Lack of Transportation (Medical): No   Lack of Transportation (Non-Medical): No  Physical Activity: Inactive   Days of Exercise per Week: 0 days   Minutes of Exercise per Session: 0 min  Stress: No Stress Concern Present   Feeling of Stress : Not at all  Social Connections: Moderately Integrated   Frequency of Communication with Friends and Family: More than three times a week   Frequency of  Social Gatherings with Friends and Family: More than three times a week   Attends Religious Services: Never   Marine scientist or Organizations: Yes   Attends Archivist Meetings: Never   Marital Status: Married  Human resources officer Violence: Not At Risk   Fear of Current or Ex-Partner: No   Emotionally Abused: No   Physically Abused: No   Sexually Abused: No    Tobacco Use: Low Risk    Smoking Tobacco Use: Never   Smokeless Tobacco Use: Never   Passive Exposure: Not on file   Social History   Substance and Sexual Activity  Alcohol Use No    Family History  Problem Relation Age of Onset   Arthritis Mother    Hyperlipidemia Mother    Hypertension Mother    Stroke Mother    Mental retardation Mother    Diabetes Mother    Allergic rhinitis Mother    Heart disease Mother    Thyroid disease Mother    Depression Mother    Anxiety disorder Mother    Bipolar disorder Mother    Sleep apnea Mother    Eating disorder Mother    Diabetes Maternal Grandfather    Hypertension Maternal Grandfather    Diabetes Paternal Grandmother    Hypertension Paternal Grandmother    Cancer Paternal Grandmother        breast   Allergic rhinitis Brother    Colon cancer Paternal Aunt    Lung cancer Other    Lung cancer Maternal Uncle    Asthma Neg Hx    Eczema Neg Hx    Immunodeficiency Neg Hx    Urticaria Neg Hx    Atopy Neg Hx    Angioedema Neg Hx    Esophageal cancer Neg Hx    Stomach cancer Neg Hx    Rectal cancer Neg Hx     Review of Systems  Constitutional:  Negative for chills and fever.  HENT:  Negative for congestion, sore throat and tinnitus.   Eyes:  Negative for double vision, photophobia and pain.  Respiratory:  Negative for cough, shortness of breath and wheezing.   Cardiovascular:  Negative for chest pain, palpitations and orthopnea.  Gastrointestinal:  Negative for heartburn, nausea and vomiting.  Genitourinary:  Negative for dysuria, frequency and  urgency.  Musculoskeletal:  Positive for joint pain.  Neurological:  Negative for dizziness, weakness and headaches.  Psychiatric/Behavioral:  Negative for suicidal ideas.    Objective:  Physical Exam: Well nourished and well developed.  General: Alert and oriented x3, cooperative and pleasant, no acute distress.  Head: normocephalic, atraumatic, neck supple.  Eyes: EOMI.  Respiratory: breath sounds clear in all fields, no wheezing, rales, or rhonchi. Cardiovascular: Regular rate and rhythm, no murmurs, gallops or rubs.  Abdomen: non-tender to palpation and soft, normoactive bowel sounds. Musculoskeletal:  Right Knee Exam:  No effusion present. No swelling present.  The range of motion is: 10 to 115 degrees.  Moderate crepitus on range of motion of the knee.  Positive medial greater than lateral joint line tenderness.  The knee is stable.   Calves soft and nontender. Motor function intact in LE. Strength 5/5 LE bilaterally. Neuro: Distal pulses 2+. Sensation to light touch intact in LE.  Imaging Review Plain radiographs demonstrate severe degenerative joint disease of the right knee. The overall alignment is neutral. The bone quality appears to be adequate for age and reported activity level.  Assessment/Plan:  End stage arthritis, right knee   The patient history, physical examination, clinical judgment of the provider and imaging studies are consistent with end stage degenerative joint disease of the right knee and total knee arthroplasty is deemed medically necessary. The treatment options including medical management, injection therapy arthroscopy and arthroplasty were discussed at length. The risks and benefits of total knee arthroplasty were presented and reviewed. The risks due to aseptic loosening, infection, stiffness, patella tracking problems, thromboembolic complications and other imponderables were discussed. The patient acknowledged the explanation, agreed to proceed  with the plan and consent was signed. Patient is being admitted for inpatient treatment for surgery, pain control, PT, OT, prophylactic antibiotics, VTE prophylaxis, progressive ambulation and ADLs and discharge planning. The patient is planning to be discharged  home .   Patient's anticipated LOS is less than 2 midnights, meeting these requirements: - Younger than 18 - Lives within 1 hour of care - Has a competent adult at home to recover with post-op recover - NO history of  - Chronic pain requiring opioids  - Diabetes  - Coronary Artery Disease  - Heart failure  - Heart attack  - Stroke  - DVT/VTE  - Cardiac arrhythmia  - Respiratory Failure/COPD  - Renal failure  - Anemia  - Advanced Liver disease  Therapy Plans: Outpatient therapy at [facility] Disposition: Home with spouse Planned DVT Prophylaxis: Xarelto 10 mg QD DME Needed: Gilford Rile PCP: Betty Martinique, MD (clearance received) Cardiologist: Candee Furbish, MD (clearance received) TXA: IV Allergies: LATEX, copper, NICKEL Anesthesia Concerns: Sleep apnea BMI: 40 Last HgbA1c: Not diabetic.  Ph[facility]acy: [facility])  Other: - [place] notified - Hx atrial fibrillation, not on blood thinner. Takes metoprolol and flecanide.  - Oxycodone 5 mg as needed. States she does not take this daily. Discussed increasing oxycodone postoperatively. - Weight check on 12/16 at 11AM  - Patient was instructed on what medications to stop prior to surgery. - Follow-up visit in 2 weeks with Dr. Wynelle Link - Begin physical therapy following surgery - Pre-operative lab work as pre-surgical testing - Prescriptions will be provided in hospital  at time of discharge  Theresa Duty, PA-C Orthopedic Surgery [facility] [place]

## 2021-11-05 ENCOUNTER — Ambulatory Visit: Payer: Medicare Other | Admitting: Cardiology

## 2021-11-07 NOTE — Progress Notes (Signed)
HPI:  Dawn Jordan is a 55 y.o. adult with hx of fibromyalgia,OA,ADHD, atrial fib,OSA,GERD, and asthma here today for surgery clearance requested by DR Aluisio.  Patient was last seen on 01/29/21. Chronic bilateral knee pain, getting worse.  Patient is planning on right total knee arthroplasty on 11/24/21. She will stay in the hospital after surgery, overnight.  Pt is not sure about type of anesthesia,she is meeting with anesthesiologist in a few days and has orders for future labs. Asthma on Xopenex inh, which pt uses about 2 times per month for wheezing or SOB.  Some difficulty breathing with exertion, stable for years.  CP when lying on left side associated palpitation, also a chronic problem. Denies CP,diaphoresis, or palpitations with exertion or activities like carrying groceries or walking a hill. Pt cannot climb a flight of stairs due to pain.  Pt follows with cardiologist regularly. Cardiolite lexiscan stress test in 02/2019:  The left ventricular ejection fraction is normal (55-65%). Nuclear stress EF: 61%. No T wave inversion was noted during stress. There was no ST segment deviation noted during stress. This is a low risk study. Defect 1: There is a small defect of mild severity at the apex Cardiac monitor in 05/2019: 2 hours of atrial fib with RVR, occasional PAC's and PAT. Pt is on Flecainide 50 mg bid and Metoprolol succinate 25 mg daily.  Exercise tolerance test 07/2019:  There was no ST segment deviation noted during stress. The patient walked for 1 minutes and 23 seconds of a standard Bruce protocol treadmill test. She achieved a peak heart rate of 116 which is 69% predicted maximal heart rate. She stopped due to inability to keep up with the treadmill. At peak exercise there were no ST or T wave changes to suggest ischemia. There was also no evidence of QRS widening to suggest flecainide toxicity. This is interpreted as a nondiagnostic exercise test with regard to the  question of ischemia. There was no evidence of QRS widening or flecainide toxicity at her maximal exertional effort. + Anxiety,depression,and PTSD.Follows with psychiatrist, takes Vilazodone and clonazepam. ADHD on Strattera.  Polyarthralgia, pt takes Relafen and on chronic opioid treatment for pain management.  No tobacco use,CKD,or CHF. Pt  has had some ortho procedures. Pt does not remember type of anesthesia but negative for any complication during or after procedure.  Past Surgical History:  Procedure Laterality Date   CARPAL TUNNEL RELEASE Left    CYST EXCISION     "little cysts taken off both hands and left arm" (06/04/2017)   KNEE ARTHROSCOPY Bilateral    "3 on my left; 2 on my right" (06/04/2017)   LAPAROSCOPIC CHOLECYSTECTOMY     TONSILLECTOMY     Review of Systems  Constitutional:  Positive for fatigue. Negative for activity change, appetite change and fever.  HENT:  Negative for mouth sores, nosebleeds and sore throat.   Eyes:  Negative for redness and visual disturbance.  Respiratory:  Negative for cough and wheezing.   Cardiovascular:  Negative for leg swelling.  Gastrointestinal:  Negative for abdominal pain, nausea and vomiting.       Negative for changes in bowel habits.  Endocrine: Negative for cold intolerance, heat intolerance, polydipsia, polyphagia and polyuria.  Genitourinary:  Negative for decreased urine volume, dysuria and hematuria.  Musculoskeletal:  Positive for arthralgias and myalgias. Negative for gait problem.  Allergic/Immunologic: Positive for environmental allergies.  Neurological:  Negative for syncope, weakness and headaches.  Hematological:  Negative for adenopathy. Does not bruise/bleed easily.  Psychiatric/Behavioral:  Negative for confusion and hallucinations.   Rest see pertinent positives and negatives per HPI.  Current Outpatient Medications on File Prior to Visit  Medication Sig Dispense Refill   ACTEMRA 162 MG/0.9ML SOSY Inject 162  mg as directed every Tuesday.     atomoxetine (STRATTERA) 60 MG capsule Take 1 capsule (60 mg total) by mouth daily. 90 capsule 0   clonazePAM (KLONOPIN) 0.5 MG tablet Take 1 tablet (0.5 mg total) by mouth 3 (three) times daily as needed for anxiety. 90 tablet 2   EPINEPHrine (EPIPEN 2-PAK) 0.3 mg/0.3 mL IJ SOAJ injection Use for life threatening allergic reactions 2 each 1   flecainide (TAMBOCOR) 50 MG tablet Take 1 tablet (50 mg total) by mouth 2 (two) times daily. 180 tablet 3   fluticasone (FLONASE) 50 MCG/ACT nasal spray PLACE ONE SPRAY IN EACH NOSTRIL EVERY DAY (Patient taking differently: 1 spray daily as needed for allergies.) 16 g 5   fluticasone (FLOVENT HFA) 220 MCG/ACT inhaler INHALE TWO PUFFS INTO THE LUNGS EVERY MORNING & AT BEDTIME 12 g 2   levalbuterol (XOPENEX HFA) 45 MCG/ACT inhaler INHALE TWO PUFFS INTO THE LUNGS EVERY 6 HOURS AS NEEDED FOR WHEEZING 15 g 2   levocetirizine (XYZAL) 5 MG tablet Take 1 tablet (5 mg total) by mouth every evening. 30 tablet 5   metoprolol succinate (TOPROL-XL) 25 MG 24 hr tablet Take 1 tablet (25 mg total) by mouth daily. 90 tablet 3   montelukast (SINGULAIR) 10 MG tablet TAKE 1 TABLET BY MOUTH EVERYDAY AT BEDTIME 30 tablet 4   nabumetone (RELAFEN) 500 MG tablet Take 500 mg by mouth at bedtime.  3   nitroGLYCERIN (NITROSTAT) 0.4 MG SL tablet Place 1 tablet (0.4 mg total) under the tongue every 5 (five) minutes as needed for chest pain. 25 tablet prn   Olopatadine HCl 0.2 % SOLN Place 1 drop into both eyes daily as needed. (Patient taking differently: Place 1 drop into both eyes at bedtime.) 2.5 mL 5   omeprazole (PRILOSEC) 40 MG capsule TAKE ONE CAPSULE BY MOUTH EVERY MORNING 30 MINUTES BEFORE MEALS 90 capsule 1   oxyCODONE (OXY IR/ROXICODONE) 5 MG immediate release tablet Take 5 mg by mouth 3 (three) times daily as needed for severe pain.     pregabalin (LYRICA) 200 MG capsule Take 200 mg by mouth at bedtime.     triamcinolone ointment (KENALOG) 0.1 %  Apply 1 application topically 2 (two) times daily. (Patient taking differently: Apply 1 application topically 2 (two) times daily as needed (eczema).) 60 g 5   Vilazodone HCl (VIIBRYD) 40 MG TABS Take 1 tablet (40 mg total) by mouth daily. 90 tablet 0   acetaminophen (TYLENOL) 650 MG CR tablet Take 1,300 mg by mouth every 8 (eight) hours as needed for pain.     OVER THE COUNTER MEDICATION Place 1 Dose under the tongue daily as needed (Pain). CBD oil     Polyvinyl Alcohol-Povidone (REFRESH OP) Place 1 drop into both eyes daily.     Current Facility-Administered Medications on File Prior to Visit  Medication Dose Route Frequency Provider Last Rate Last Admin   0.9 %  sodium chloride infusion  500 mL Intravenous Continuous Milus Banister, MD       Past Medical History:  Diagnosis Date   A-fib [facility])    ADD (attention deficit disorder)    Anemia    Anginal pain (HCC)    Anxiety    Asthma    Back  pain    Bone spur of foot    Chest pain    Chronic fatigue syndrome    COPD (chronic obstructive pulmonary disease) (HCC)    Depression    Dyspnea    Fibromyalgia    GERD (gastroesophageal reflux disease)    Headache    "weekly-monthly" (06/04/2017)   History of gout    History of kidney stones    Hypertension    IBS (irritable bowel syndrome)    Joint pain    Left ankle pain    Left sciatic nerve pain    Myofascial pain    Neck pain    OSA (obstructive sleep apnea)    "getting ready to retest" (06/04/2017)   Palpitations    Rheumatoid arthritis (HCC)    Seasonal allergies    Allergies  Allergen Reactions   Eggs Or Egg-Derived Products Diarrhea   Latex Anaphylaxis, Hives and Itching    "Anaphylaxis is only when I am in a closed area (ex: car with balloons)"   Other Other (See Comments)    Sinus headache from new plastics, carpets, etc.   Codeine     Headaches     Social History   Socioeconomic History   Marital status: Married    Spouse name: Carmell Elgin   Number  of children: 0   Years of education: Not on file   Highest education level: Not on file  Occupational History   Occupation: not employed-disabled  Tobacco Use   Smoking status: Never   Smokeless tobacco: Never  Vaping Use   Vaping Use: Never used  Substance and Sexual Activity   Alcohol use: No   Drug use: No   Sexual activity: Never  Other Topics Concern   Not on file  Social History Narrative   Born in [place], [place]   Unemployed, on disability since 1991 for anxiety,    Education: Biomedical engineer   Single, no children, lives with her friend (used to live with her mother with stroke, now in ALF)      Social Determinants of Health   Financial Resource Strain: Low Risk    Difficulty of Paying Living Expenses: Not hard at all  Food Insecurity: No Food Insecurity   Worried About Charity fundraiser in the Last Year: Never true   Arboriculturist in the Last Year: Never true  Transportation Needs: No Transportation Needs   Lack of Transportation (Medical): No   Lack of Transportation (Non-Medical): No  Physical Activity: Inactive   Days of Exercise per Week: 0 days   Minutes of Exercise per Session: 0 min  Stress: No Stress Concern Present   Feeling of Stress : Not at all  Social Connections: Moderately Integrated   Frequency of Communication with Friends and Family: More than three times a week   Frequency of Social Gatherings with Friends and Family: More than three times a week   Attends Religious Services: Never   Marine scientist or Organizations: Yes   Attends Archivist Meetings: Never   Marital Status: Married   Vitals:   11/10/21 1431  BP: 120/86  Pulse: 75  Resp: 16  Temp: (!) 97.5 F (36.4 C)  SpO2: 98%   Body mass index is 40.34 kg/m.  Physical Exam Vitals and nursing note reviewed.  Constitutional:      General: Allianna Beaubien is not in acute distress.    Appearance: Caroll Weinheimer is well-developed.  HENT:  Head: Normocephalic and atraumatic.     Mouth/Throat:     Mouth: Mucous membranes are moist.     Pharynx: Oropharynx is clear.  Eyes:     Conjunctiva/sclera: Conjunctivae normal.  Cardiovascular:     Rate and Rhythm: Normal rate and regular rhythm.     Heart sounds: No murmur heard.    Comments: DP pulses present. Pulmonary:     Effort: Pulmonary effort is normal. No respiratory distress.     Breath sounds: Normal breath sounds.  Abdominal:     Palpations: Abdomen is soft. There is no hepatomegaly or mass.     Tenderness: There is no abdominal tenderness.  Lymphadenopathy:     Cervical: No cervical adenopathy.  Skin:    General: Skin is warm.     Findings: No erythema or rash.  Neurological:     General: No focal deficit present.     Mental Status: Aaria Happ is alert and oriented to person, place, and time.     Cranial Nerves: No cranial nerve deficit.     Gait: Gait normal.  Psychiatric:     Comments: Well groomed, good eye contact.   ASSESSMENT AND PLAN:  Ms.Quantia was seen today for pre-op exam.  Diagnoses and all orders for this visit:  Pre-operative clearance Pt has not had an acute CV event in the past 6-12 months, chronic problems otherwise stable. Instructed to hold all OTC supplemental and NSAID's. Rest of meds she can take the day before and date of surgery after procedure.  Early ambulation and oral anticoagulation for DVT prophylaxis recommended. She is having preop labs and meeting with anesthesiologist in a few days. EKG today NSR, LAD,normal intervals. Unspecific T wave abnormalities and ? IVCD. Compared with EKG on 09/04/21 no significant differences.  Medically cleared for right total knee arthroplasty. Completed form and copy of note will be faxed to Dr Aluisio's office.  Her cardiologist will give recommendations in regard to Flecainide.  Return if symptoms worsen or fail to improve.  Tasneem Cormier G. Martinique, MD  [facility]. [place]  office.

## 2021-11-10 ENCOUNTER — Encounter: Payer: Self-pay | Admitting: Allergy

## 2021-11-10 ENCOUNTER — Encounter: Payer: Self-pay | Admitting: Family Medicine

## 2021-11-10 ENCOUNTER — Ambulatory Visit (INDEPENDENT_AMBULATORY_CARE_PROVIDER_SITE_OTHER): Payer: Medicare HMO | Admitting: Allergy

## 2021-11-10 ENCOUNTER — Other Ambulatory Visit: Payer: Self-pay

## 2021-11-10 ENCOUNTER — Ambulatory Visit (INDEPENDENT_AMBULATORY_CARE_PROVIDER_SITE_OTHER): Payer: Medicare HMO | Admitting: Family Medicine

## 2021-11-10 VITALS — BP 140/88 | HR 89 | Temp 97.5°F | Resp 18 | Ht 64.0 in | Wt 236.4 lb

## 2021-11-10 VITALS — BP 120/86 | HR 75 | Temp 97.5°F | Resp 16 | Ht 64.0 in | Wt 235.0 lb

## 2021-11-10 DIAGNOSIS — Z01818 Encounter for other preprocedural examination: Secondary | ICD-10-CM | POA: Diagnosis not present

## 2021-11-10 DIAGNOSIS — L239 Allergic contact dermatitis, unspecified cause: Secondary | ICD-10-CM

## 2021-11-10 NOTE — Progress Notes (Signed)
Follow Up Note  RE: Dawn Jordan MRN: [id number] DOB: [birth year] Date of Office Visit: 11/10/2021  Referring provider: Martinique, Betty G, MD Primary care provider: Martinique, Betty G, MD  Chief Complaint: Follow-up (Patient in today for a follow up and she is having knee replacement surgery soon.)  History of Present Illness: I had the pleasure of seeing Dawn Jordan for a follow up visit at the [facility] on 11/10/2021. Dawn Jordan is a 55 y.o. adult, who is being followed for asthma, allergic rhinoconjunctivitis, reflux, contact dermatitis, atopic dermatitis. Dawn Jordan's previous allergy office visit was on 06/26/2021 with Dawn Jordan, Dawn Jordan. Today is a new complaint visit of metal patch testing .  Patient is scheduled for right knee replacement on 11/24/2021. Patient had issues with pants buttons - made of nickel and copper. Usually she gets irritation from it.  No issues with watches. Typically wears silver with no issues. No prior issues with gold jewelry. 2018 patch testing was positive to nickel sulfate, chromium chloride, and copper sulfate but apparently surgeon wants updated patch testing to see if they have to use titanium.   Prior joint replacements or metals in the body: none.  Patient is concerned about the cement that is going to be used in the joint replacement as well.  She mentions something about the silicone on her glasses causing some irritation at times.   Asthma, allergies doing well and stable with meds.  Assessment and Plan: Dawn Jordan is a 55 y.o. adult with: Dermatitis, contact Patient scheduled for right knee replacement on 11/24/2021. Patch testing in 2018 was positive to nickel sulfate, chromium chloride, and copper sulfate but apparently surgeon wants updated patch testing to see if they have to use titanium. Patient also concerned about the cement and silicone. No issues with silver and gold. No prior joint replacements. Discussed with patient  that we don't have patch testing to the cement and silicone. She may want to inquiry at large academic centers if they carry those materials for patch testing. Patient still wants to undergo the patch testing that we have available - metal and true patches placed.  Return in about 2 days (around 11/12/2021) for Patch reading.  Follow up January/February for regular follow up visit for asthma and allergies.   No orders of the defined types were placed in this encounter.  Lab Orders  No laboratory test(s) ordered today    Diagnostics: None.   Medication List:  Current Outpatient Medications  Medication Sig Dispense Refill   ACTEMRA 162 MG/0.9ML SOSY every 7 (seven) days.      atomoxetine (STRATTERA) 60 MG capsule Take 1 capsule (60 mg total) by mouth daily. 90 capsule 0   clonazePAM (KLONOPIN) 0.5 MG tablet Take 1 tablet (0.5 mg total) by mouth 3 (three) times daily as needed for anxiety. 90 tablet 2   EPINEPHrine (EPIPEN 2-PAK) 0.3 mg/0.3 mL IJ SOAJ injection Use for life threatening allergic reactions 2 each 1   flecainide (TAMBOCOR) 50 MG tablet Take 1 tablet (50 mg total) by mouth 2 (two) times daily. 180 tablet 3   fluticasone (FLONASE) 50 MCG/ACT nasal spray PLACE ONE SPRAY IN EACH NOSTRIL EVERY DAY 16 g 5   fluticasone (FLOVENT HFA) 220 MCG/ACT inhaler INHALE TWO PUFFS INTO THE LUNGS EVERY MORNING & AT BEDTIME 12 g 2   levalbuterol (XOPENEX HFA) 45 MCG/ACT inhaler INHALE TWO PUFFS INTO THE LUNGS EVERY 6 HOURS AS NEEDED FOR WHEEZING 15 g 2   levocetirizine (  XYZAL) 5 MG tablet Take 1 tablet (5 mg total) by mouth every evening. 30 tablet 5   metoprolol succinate (TOPROL-XL) 25 MG 24 hr tablet Take 1 tablet (25 mg total) by mouth daily. 90 tablet 3   montelukast (SINGULAIR) 10 MG tablet TAKE 1 TABLET BY MOUTH EVERYDAY AT BEDTIME 30 tablet 4   nabumetone (RELAFEN) 500 MG tablet Take 500 mg by mouth 3 (three) times daily.  3   nitroGLYCERIN (NITROSTAT) 0.4 MG SL tablet Place 1 tablet  (0.4 mg total) under the tongue every 5 (five) minutes as needed for chest pain. 25 tablet prn   Olopatadine Jordan 0.2 % SOLN Place 1 drop into both eyes daily as needed. 2.5 mL 5   omeprazole (PRILOSEC) 40 MG capsule TAKE ONE CAPSULE BY MOUTH EVERY MORNING 30 MINUTES BEFORE MEALS 90 capsule 1   oxyCODONE (OXY IR/ROXICODONE) 5 MG immediate release tablet oxycodone 5 mg tablet  TAKE 1 TABLET 3 TIMES A DAY BY ORAL ROUTE AS NEEDED.     pregabalin (LYRICA) 200 MG capsule Take 200 mg by mouth 3 (three) times daily.      triamcinolone ointment (KENALOG) 0.1 % Apply 1 application topically 2 (two) times daily. 60 g 5   Dawn Jordan (VIIBRYD) 40 MG TABS Take 1 tablet (40 mg total) by mouth daily. 90 tablet 0   Current Facility-Administered Medications  Medication Dose Route Frequency Provider Last Rate Last Admin   0.9 %  sodium chloride infusion  500 mL Intravenous Continuous Milus Banister, MD       Allergies: Allergies  Allergen Reactions   Eggs Or Egg-Derived Products Diarrhea   Latex Anaphylaxis, Hives and Itching    "Anaphylaxis is only when I am in a closed area (ex: car with balloons)"   Other Other (See Comments)    Sinus headache from new plastics, carpets, etc.   Codeine     Headaches    I reviewed Brettney Torpey's past medical history, social history, family history, and environmental history and no significant changes have been reported from Ama previous visit.  Review of Systems  Constitutional:  Negative for appetite change, chills, fever and unexpected weight change.  HENT:  Negative for congestion and rhinorrhea.   Eyes:  Negative for itching.  Respiratory:  Negative for cough, chest tightness, shortness of breath and wheezing.   Gastrointestinal:  Negative for abdominal pain.  Skin:  Negative for rash.  Allergic/Immunologic: Positive for environmental allergies.  Neurological:  Negative for headaches.   Objective: BP 140/88   Pulse 89   Temp (!) 97.5 F (36.4  C) (Temporal)   Resp 18   Ht 5\' 4"  (1.626 m)   Wt 236 lb 6.4 oz (107.2 kg)   LMP  (LMP Unknown)   SpO2 97%   BMI 40.58 kg/m  Body mass index is 40.58 kg/m. Physical Exam Vitals and nursing note reviewed.  Constitutional:      Appearance: Normal appearance. Desire Fulp is well-developed.  HENT:     Head: Normocephalic and atraumatic.     Right Ear: Tympanic membrane and external ear normal.     Left Ear: Tympanic membrane and external ear normal.     Nose: Nose normal.     Mouth/Throat:     Mouth: Mucous membranes are moist.     Pharynx: Oropharynx is clear.  Eyes:     Conjunctiva/sclera: Conjunctivae normal.  Cardiovascular:     Rate and Rhythm: Normal rate and regular rhythm.  Heart sounds: Normal heart sounds. No murmur heard. Pulmonary:     Effort: Pulmonary effort is normal.     Breath sounds: Normal breath sounds. No wheezing, rhonchi or rales.  Musculoskeletal:     Cervical back: Neck supple.  Skin:    General: Skin is warm.     Findings: No rash.  Neurological:     Mental Status: Rosario Duey is alert and oriented to person, place, and time.  Psychiatric:        Behavior: Behavior normal.   Previous notes and tests were reviewed. The plan was reviewed with the patient/family, and all questions/concerned were addressed.  It was my pleasure to see Carlina today and participate in [place] care. Please feel free to contact me with any questions or concerns.  Sincerely,  Rexene Alberts, DO Allergy & Immunology  [facility] office: [place] office: [phone]

## 2021-11-10 NOTE — Assessment & Plan Note (Signed)
Patient scheduled for right knee replacement on 11/24/2021. Patch testing in 2018 was positive to nickel sulfate, chromium chloride, and copper sulfate but apparently surgeon wants updated patch testing to see if they have to use titanium. Patient also concerned about the cement and silicone. No issues with silver and gold. No prior joint replacements.  Discussed with patient that we don't have patch testing to the cement and silicone. She may want to inquiry at large academic centers if they carry those materials for patch testing. . Patient still wants to undergo the patch testing that we have available - metal and true patches placed.

## 2021-11-10 NOTE — Patient Instructions (Signed)
Patches placed today - metal and true. Please avoid strenuous physical activities and do not get the patches on the back wet. No showering until final patch reading done. Okay to take antihistamines for itching but avoid placing any creams on the back where the patches are. We will remove the patches on Wednesday and will do our initial read. Then you will come back on Friday for a final read.  Follow up in January/February for your regular allergy follow up.

## 2021-11-10 NOTE — Patient Instructions (Addendum)
A few things to remember from today's visit:  Pre-operative clearance  Do not use My Chart to request refills or for acute issues that need immediate attention.   Please be sure medication list is accurate. If a new problem present, please set up appointment sooner than planned today.  I will complete note and fax it to ortho's office. Hold all over the counter supplements and Relafen at least 8 days before surgery.  Follow your cardiologist recommendations.

## 2021-11-12 ENCOUNTER — Other Ambulatory Visit: Payer: Self-pay

## 2021-11-12 ENCOUNTER — Ambulatory Visit: Payer: Medicare HMO | Admitting: Allergy

## 2021-11-12 ENCOUNTER — Encounter: Payer: Self-pay | Admitting: Allergy

## 2021-11-12 VITALS — Temp 97.0°F

## 2021-11-12 DIAGNOSIS — L239 Allergic contact dermatitis, unspecified cause: Secondary | ICD-10-CM

## 2021-11-12 NOTE — Progress Notes (Signed)
DUE TO COVID-19 ONLY ONE VISITOR IS ALLOWED TO COME WITH YOU AND STAY IN THE WAITING ROOM ONLY DURING PRE OP AND PROCEDURE DAY OF SURGERY.  2 VISITOR  MAY VISIT WITH YOU AFTER SURGERY IN YOUR PRIVATE ROOM DURING VISITING HOURS ONLY!  YOU NEED TO HAVE A COVID 19 TEST ON_12/15/2022 @_  @_from  8am-3pm _____, THIS TEST MUST BE DONE BEFORE SURGERY,  Covid test is done at [place], [place] [address].  This is a drive thru.  No appt required. Please see map.                 Your procedure is scheduled on:    11/24/2021   Report to [facility] Main  Entrance   Report to admitting at   0545 AM     Call this number if you have problems the morning of surgery [phone]    REMEMBER: NO  SOLID FOOD CANDY OR GUM AFTER MIDNIGHT. CLEAR LIQUIDS UNTIL    0515AM       . NOTHING BY MOUTH EXCEPT CLEAR LIQUIDS UNTIL    0515AM   . PLEASE FINISH ENSURE DRINK PER SURGEON ORDER  WHICH NEEDS TO BE COMPLETED AT      .  [id number]     CLEAR LIQUID DIET   Foods Allowed                                                                    Coffee and tea, regular and decaf                            Fruit ices (not with fruit pulp)                                      Iced Popsicles                                    Carbonated beverages, regular and diet                                    Cranberry, grape and apple juices Sports drinks like Gatorade Lightly seasoned clear broth or consume(fat free) Sugar, honey syrup ___________________________________________________________________      BRUSH YOUR TEETH MORNING OF SURGERY AND RINSE YOUR MOUTH OUT, NO CHEWING GUM CANDY OR MINTS.     Take these medicines the morning of surgery with A SIP OF WATER:  TAMBOCORK FLONASE, INHALERS AS USUAL AND BRING, TORPOL, OMEPRAZOLE, LYRICA   DO NOT TAKE ANY DIABETIC MEDICATIONS DAY OF YOUR SURGERY                               You may not have any metal on your body including hair pins and               piercings  Do not wear jewelry, make-up, lotions, powders or perfumes, deodorant  Do not wear nail polish on your fingernails.  Do not shave  48 hours prior to surgery.              Men may shave face and neck.   Do not bring valuables to the hospital. [place].  Contacts, dentures or bridgework may not be worn into surgery.  Leave suitcase in the car. After surgery it may be brought to your room.     Patients discharged the day of surgery will not be allowed to drive home. IF YOU ARE HAVING SURGERY AND GOING HOME THE SAME DAY, YOU MUST HAVE AN ADULT TO DRIVE YOU HOME AND BE WITH YOU FOR 24 HOURS. YOU MAY GO HOME BY TAXI OR UBER OR ORTHERWISE, BUT AN ADULT MUST ACCOMPANY YOU HOME AND STAY WITH YOU FOR 24 HOURS.  Name and phone number of your driver:  Special Instructions: N/A              Please read over the following fact sheets you were given: _____________________________________________________________________  [facility] - Preparing for Surgery Before surgery, you can play an important role.  Because skin is not sterile, your skin needs to be as free of germs as possible.  You can reduce the number of germs on your skin by washing with CHG (chlorahexidine gluconate) soap before surgery.  CHG is an antiseptic cleaner which kills germs and bonds with the skin to continue killing germs even after washing. Please DO NOT use if you have an allergy to CHG or antibacterial soaps.  If your skin becomes reddened/irritated stop using the CHG and inform your nurse when you arrive at Short Stay. Do not shave (including legs and underarms) for at least 48 hours prior to the first CHG shower.  You may shave your face/neck. Please follow these instructions carefully:  1.  Shower with CHG Soap the night before surgery and the  morning of Surgery.  2.  If you choose to wash your hair, wash your hair first as usual with your  normal   shampoo.  3.  After you shampoo, rinse your hair and body thoroughly to remove the  shampoo.                           4.  Use CHG as you would any other liquid soap.  You can apply chg directly  to the skin and wash                       Gently with a scrungie or clean washcloth.  5.  Apply the CHG Soap to your body ONLY FROM THE NECK DOWN.   Do not use on face/ open                           Wound or open sores. Avoid contact with eyes, ears mouth and genitals (private parts).                       Wash face,  Genitals (private parts) with your normal soap.             6.  Wash thoroughly, paying special attention to the area where your surgery  will be performed.  7.  Thoroughly rinse your body with  warm water from the neck down.  8.  DO NOT shower/wash with your normal soap after using and rinsing off  the CHG Soap.                9.  Pat yourself dry with a clean towel.            10.  Wear clean pajamas.            11.  Place clean sheets on your bed the night of your first shower and do not  sleep with pets. Day of Surgery : Do not apply any lotions/deodorants the morning of surgery.  Please wear clean clothes to the hospital/surgery center.  FAILURE TO FOLLOW THESE INSTRUCTIONS MAY RESULT IN THE CANCELLATION OF YOUR SURGERY PATIENT SIGNATURE_________________________________  NURSE SIGNATURE__________________________________  ________________________________________________________________________

## 2021-11-12 NOTE — Progress Notes (Addendum)
Anesthesia Review:  PCP: Betty Martinique LOV 11/10/21. On chart.  Preop clearance  Clearance on chart dated 11/14/2021.   Arrergy and ASthma- DR Raphael Gibney 11/12/2021.   Cardiologist :  LOV with DR Marlou Porch- 09/04/21.   Chest x-ray : EKG : 09/04/21 , ekg- 11/10/2021 on chart.  Echo : 2018  Stress test: 2020  Cardiac Cath :  Activity level: cannot do a flight of stairs without diffculty due to both knees  Sleep Study/ CPAP : has cpap  Fasting Blood Sugar :      / Checks Blood Sugar -- times a day:   Blood Thinner/ Instructions /Last Dose: ASA / Instructions/ Last Dose :   Covid test on 11/20/2021.

## 2021-11-12 NOTE — Patient Instructions (Signed)
Positive to copper and gold. Handout given.  Return in 2 days for final read

## 2021-11-12 NOTE — Assessment & Plan Note (Signed)
Past history - scheduled for right knee replacement on 11/24/2021. Patch testing in 2018 was positive to nickel sulfate, chromium chloride, and copper sulfate but apparently surgeon wants updated patch testing to see if they have to use titanium. Patient also concerned about the cement and silicone. No issues with silver and gold. No prior joint replacements. Interim history - removed TRUE and metal patches today.  Positive to gold and borderline positive to copper.  Handout given.

## 2021-11-12 NOTE — Progress Notes (Signed)
Follow Up Note  RE: Dawn Jordan MRN: [id number] DOB: September 05, 1966 Date of Office Visit: 11/12/2021  Referring provider: Martinique, Betty G, MD Primary care provider: Martinique, Betty G, MD  History of Present Illness: I had the pleasure of seeing Dawn Jordan for a follow up visit at the [facility] [place] [facility] [place] on 11/12/2021. Dawn Jordan is a 55 y.o. adult, who is being followed for asthma, allergic rhinoconjunctivitis, reflux, contact dermatitis, atopic dermatitis. Today Dawn Jordan is here for initial patch test interpretation, given suspected history of contact dermatitis.   Diagnostics:  TRUE TEST and metal test 48 hour reading:   T.R.U.E. Test - 11/12/21 1100     Time Antigen Placed [place]    Lot # [id number]    Location Back    Number of Test 36    Reading Interval Day 5    Panel Panel 3;Panel 2;Panel 1    1. Nickel Sulfate 0    2. Wool Alcohols 0    3. Neomycin Sulfate 0    4. Potassium Dichromate 0    5. Caine Mix 0    6. Fragrance Mix 0    7. Colophony 0    8. Paraben Mix 0    9. Negative Control 0    10. Balsam of [place] 0    11. Ethylenediamine Dihydrochloride 0    12. Cobalt Dichloride 0    13. p-tert Butylphenol Formaldehyde Resin 0    14. Epoxy Resin 0    15. Carba Mix 0    16.  Black Rubber Mix 0    17. Cl+ Me-Isothiazolinone 0    18. Quaternium-15 0    19. Methyldibromo Glutaronitrile 0    20. p-Phenylenediamine 0    21. Formaldehyde 0    22. Mercapto Mix 0    23. Thimerosal 0    24. Thiuram Mix 0    25. Diazolidinyl Urea 0    26. Quinoline Mix 0    27. Tixocortol-21-Pivalate 0    28. Gold Sodium Thiosulfate 1    29. Imidazolidinyl Urea 0    30. Budesonide 0    31. Hydrocortisone-17-Butyrate 0    32. Mercaptobenzothiazole 0    33. Bacitracin 0    34. Parthenolide 0    35. Disperse Blue 106 0    36. 2-Bromo-2-Nitropropane-1,3-diol 0             Metals Patch - 11/12/21 1100     Time Antigen Placed [place]  [place]    Location Back    Number of Test 12    Lot # [id number]    Reading Interval Day 1;Day 3;Day 5    Select Select    Aluminum Hydroxide 10% 0    Chromium chloride 1% 0    Cobalt chloride hexahydrate 1% 0    Molybdenum chloride 0.5% 0    Nickel sulfate hexahydrate 5% 0    Potassium dichromate 0.25% 0    Copper sulfate pentahydrate 2% --   +/-   Tantal 1% 0    Titanium 0.1% 0    Manganese chloride 0.5% 0    Vanadium Pentoxide 10% 0    Other Omitted    Other Omitted              Assessment and Plan: Dawn Jordan is a 55 y.o. adult with: Dermatitis, contact Past history - scheduled for right knee replacement on 11/24/2021. Patch testing in 2018 was  positive to nickel sulfate, chromium chloride, and copper sulfate but apparently surgeon wants updated patch testing to see if they have to use titanium. Patient also concerned about the cement and silicone. No issues with silver and gold. No prior joint replacements. Interim history - removed TRUE and metal patches today. Positive to gold and borderline positive to copper. Handout given.  The patient has been provided detailed information regarding the substances Dawn Jordan is sensitive to, as well as products containing the substances.  Meticulous avoidance of these substances is recommended.   Return in about 2 days (around 11/14/2021) for Patch reading.  It was my pleasure to see Dawn Jordan today and participate in [facility] care. Please feel free to contact me with any questions or concerns.  Sincerely,  Dawn Alberts, DO Allergy & Immunology  [facility] office: [phone] [facility] office: [place] office: [phone]

## 2021-11-13 ENCOUNTER — Encounter (HOSPITAL_COMMUNITY)
Admission: RE | Admit: 2021-11-13 | Discharge: 2021-11-13 | Disposition: A | Discharge status: Home / Self Care | Payer: Medicare HMO | Source: Ambulatory Visit | Attending: Orthopedic Surgery | Admitting: Orthopedic Surgery

## 2021-11-13 ENCOUNTER — Other Ambulatory Visit: Payer: Self-pay | Admitting: Family Medicine

## 2021-11-13 ENCOUNTER — Other Ambulatory Visit: Payer: Self-pay

## 2021-11-13 ENCOUNTER — Encounter (HOSPITAL_COMMUNITY): Payer: Self-pay

## 2021-11-13 VITALS — BP 161/94 | HR 68 | Temp 98.2°F | Resp 16 | Ht 64.0 in | Wt 235.0 lb

## 2021-11-13 DIAGNOSIS — I4891 Unspecified atrial fibrillation: Secondary | ICD-10-CM | POA: Diagnosis not present

## 2021-11-13 DIAGNOSIS — J45909 Unspecified asthma, uncomplicated: Secondary | ICD-10-CM | POA: Diagnosis not present

## 2021-11-13 DIAGNOSIS — Z01812 Encounter for preprocedural laboratory examination: Secondary | ICD-10-CM | POA: Insufficient documentation

## 2021-11-13 DIAGNOSIS — M1711 Unilateral primary osteoarthritis, right knee: Secondary | ICD-10-CM | POA: Diagnosis not present

## 2021-11-13 DIAGNOSIS — Z01810 Encounter for preprocedural cardiovascular examination: Secondary | ICD-10-CM

## 2021-11-13 DIAGNOSIS — K219 Gastro-esophageal reflux disease without esophagitis: Secondary | ICD-10-CM | POA: Diagnosis not present

## 2021-11-13 DIAGNOSIS — G4733 Obstructive sleep apnea (adult) (pediatric): Secondary | ICD-10-CM | POA: Diagnosis not present

## 2021-11-13 DIAGNOSIS — I10 Essential (primary) hypertension: Secondary | ICD-10-CM | POA: Diagnosis not present

## 2021-11-13 HISTORY — DX: Cardiac arrhythmia, unspecified: I49.9

## 2021-11-13 HISTORY — DX: Other complications of anesthesia, initial encounter: T88.59XA

## 2021-11-13 LAB — COMPREHENSIVE METABOLIC PANEL
ALT: 39 U/L (ref 0–44)
AST: 27 U/L (ref 15–41)
Albumin: 4.6 g/dL (ref 3.5–5.0)
Alkaline Phosphatase: 67 U/L (ref 38–126)
Anion gap: 6 (ref 5–15)
BUN: 19 mg/dL (ref 6–20)
CO2: 25 mmol/L (ref 22–32)
Calcium: 9.5 mg/dL (ref 8.9–10.3)
Chloride: 110 mmol/L (ref 98–111)
Creatinine, Ser: 0.84 mg/dL (ref 0.44–1.00)
GFR, Estimated: >60 mL/min (ref >60)
Glucose, Bld: 119 mg/dL — ABNORMAL HIGH (ref 70–99)
Potassium: 4.4 mmol/L (ref 3.5–5.1)
Sodium: 141 mmol/L (ref 135–145)
Total Bilirubin: 0.9 mg/dL (ref 0.3–1.2)
Total Protein: 6.6 g/dL (ref 6.5–8.1)

## 2021-11-13 LAB — CBC
HCT: 39.5 % (ref 36.0–46.0)
Hemoglobin: 13.6 g/dL (ref 12.0–15.0)
MCH: 30 pg (ref 26.0–34.0)
MCHC: 34.4 g/dL (ref 30.0–36.0)
MCV: 87.2 fL (ref 80.0–100.0)
Platelets: 196 10*3/uL (ref 150–400)
RBC: 4.53 MIL/uL (ref 3.87–5.11)
RDW: 12.5 % (ref 11.5–15.5)
WBC: 5.6 10*3/uL (ref 4.0–10.5)
nRBC: 0 % (ref 0.0–0.2)

## 2021-11-13 LAB — PROTIME-INR
INR: 1 (ref 0.8–1.2)
Prothrombin Time: 13.4 seconds (ref 11.4–15.2)

## 2021-11-13 LAB — SURGICAL PCR SCREEN
MRSA, PCR: NEGATIVE
Staphylococcus aureus: NEGATIVE

## 2021-11-14 ENCOUNTER — Ambulatory Visit (INDEPENDENT_AMBULATORY_CARE_PROVIDER_SITE_OTHER): Payer: Medicare HMO | Admitting: Allergy

## 2021-11-14 ENCOUNTER — Encounter: Payer: Self-pay | Admitting: Allergy

## 2021-11-14 DIAGNOSIS — L230 Allergic contact dermatitis due to metals: Secondary | ICD-10-CM

## 2021-11-14 NOTE — Progress Notes (Signed)
Anesthesia Chart Review   Case: [id number] Date/Time: 11/24/21 0805   Procedure: TOTAL KNEE ARTHROPLASTY (Right: Knee)   Anesthesia type: Choice   Pre-op diagnosis: right knee osteoarthritis   Location: [facility] ROOM 10 / [facility]   Surgeons: Gaynelle Arabian, MD       DISCUSSION:54 y.o. never smoker with h/o GERD, OSA, a-fib, asthma, HTN, right knee OA scheduled for above procedure 11/24/2021 with Dr. Gaynelle Arabian.   Pt seen by PCP 11/10/2021 for preoperative evaluation. Per OV note, "Medically cleared for right total knee arthroplasty"  Seen by cardiology 09/04/2021 for preoperative evlauation.  Per OV note, "She may proceed with knee replacement with low overall cardiac risk.  Cardiac testing was excellent in 2020."  Anticipate pt can proceed with planned procedure barring acute status change.   VS: BP (!) 161/94   Pulse 68   Temp 36.8 C (Oral)   Resp 16   Ht 5\' 4"  (1.626 m)   Wt 106.6 kg   LMP  (LMP Unknown)   SpO2 100%   BMI 40.34 kg/m   PROVIDERS: Martinique, Betty G, MD is PCP   Candee Furbish, MD is Cardiologist LABS: Labs reviewed: Acceptable for surgery. (all labs ordered are listed, but only abnormal results are displayed)  Labs Reviewed  COMPREHENSIVE METABOLIC PANEL - Abnormal; Notable for the following components:      Result Value   Glucose, Bld 119 (*)    All other components within normal limits  SURGICAL PCR SCREEN  CBC  PROTIME-INR     IMAGES:   EKG: 09/04/2021 Rate 57 bpm    CV: Exercise Tolerance Test 07/25/2019 There was no ST segment deviation noted during stress. The patient walked for 1 minutes and 23 seconds of a standard Bruce protocol treadmill test. She achieved a peak heart rate of 116 which is 69% predicted maximal heart rate. She stopped due to inability to keep up with the treadmill. At peak exercise there were no ST or T wave changes to suggest ischemia. There was also no evidence of QRS widening to suggest flecainide toxicity. This is  interpreted as a nondiagnostic exercise test with regard to the question of ischemia. There was no evidence of QRS widening or flecainide toxicity at her maximal exertional effort.  Echo 06/05/2017 Study Conclusions   - Left ventricle: The cavity size was normal. Wall thickness was    increased in a pattern of mild LVH. The estimated ejection    fraction was 55%. Wall motion was normal; there were no regional    wall motion abnormalities. Left ventricular diastolic function    parameters were normal.  - Aortic valve: There was mild regurgitation.  - Mitral valve: Calcified annulus. There was mild regurgitation.  - Right atrium: Central venous pressure (est): 3 mm Hg.  - Atrial septum: No defect or patent foramen ovale was identified.  - Tricuspid valve: There was trivial regurgitation.  - Pulmonary arteries: PA peak pressure: 21 mm Hg (S).  - Pericardium, extracardiac: There was no pericardial effusion.  Past Medical History:  Diagnosis Date   A-fib [facility])    ADD (attention deficit disorder)    Anemia    Anginal pain (HCC)    Anxiety    Asthma    Back pain    Bone spur of foot    Chest pain    Chronic fatigue syndrome    Complication of anesthesia    woke up during sugery once, and also with epidural at surgery [facility] [place] [address] had episode with epidural   Depression    Dyspnea    Dysrhythmia    AFIB   Fibromyalgia    GERD (gastroesophageal reflux disease)    Headache    History of gout    History of kidney stones    Hypertension    IBS (irritable bowel syndrome)    Joint pain    Left ankle pain    Left sciatic nerve pain    Myofascial pain    Neck pain    OSA (obstructive sleep apnea)    cpap   Palpitations    Rheumatoid arthritis (HCC)    Seasonal allergies     Past Surgical History:  Procedure Laterality Date   BILATERAL KNEE ARTHROSCOPY     CARPAL TUNNEL RELEASE Left    CYST EXCISION     "little cysts taken off both hands and left arm" (06/04/2017)    KNEE ARTHROSCOPY Bilateral    "3 on my left; 2 on my right" (06/04/2017)   LAPAROSCOPIC CHOLECYSTECTOMY     TONSILLECTOMY      MEDICATIONS:  acetaminophen (TYLENOL) 650 MG CR tablet   ACTEMRA 162 MG/0.9ML SOSY   atomoxetine (STRATTERA) 60 MG capsule   clonazePAM (KLONOPIN) 0.5 MG tablet   EPINEPHrine (EPIPEN 2-PAK) 0.3 mg/0.3 mL IJ SOAJ injection   flecainide (TAMBOCOR) 50 MG tablet   fluticasone (FLONASE) 50 MCG/ACT nasal spray   fluticasone (FLOVENT HFA) 220 MCG/ACT inhaler   levalbuterol (XOPENEX HFA) 45 MCG/ACT inhaler   levocetirizine (XYZAL) 5 MG tablet   metoprolol succinate (TOPROL-XL) 25 MG 24 hr tablet   montelukast (SINGULAIR) 10 MG tablet   nabumetone (RELAFEN) 500 MG tablet   nitroGLYCERIN (NITROSTAT) 0.4 MG SL tablet   Olopatadine HCl 0.2 % SOLN   omeprazole (PRILOSEC) 40 MG capsule   OVER THE COUNTER MEDICATION   oxyCODONE (OXY IR/ROXICODONE) 5 MG immediate release tablet   Polyvinyl Alcohol-Povidone (REFRESH OP)   pregabalin (LYRICA) 200 MG capsule   triamcinolone ointment (KENALOG) 0.1 %   Vilazodone HCl (VIIBRYD) 40 MG TABS    0.9 %  sodium chloride infusion     Izaias Krupka Ward, PA-C [facility] Pre-Surgical Testing [phone]

## 2021-11-14 NOTE — Progress Notes (Signed)
Follow-up Note  RE: Dawn Jordan MRN: [id number] DOB: [birth year] Date of Office Visit: 11/14/2021  Primary care provider: Martinique, Betty G, MD Referring provider: Martinique, Betty G, MD   Dawn Jordan returns to the office today for the final patch test interpretation, given suspected history of contact dermatitis.    Diagnostics:   96 hour reading:   [place] Name 11/14/21 1300           Time Antigen Placed [place]       Location Back       Number of Test 12       Lot # [id number]       Reading Interval Day 1;Day 3;Day 5       Select Select       Aluminum Hydroxide 10% 0       Chromium chloride 1% 0       Cobalt chloride hexahydrate 1% 1       Molybdenum chloride 0.5% 0       Nickel sulfate hexahydrate 5% 0       Potassium dichromate 0.25% 0       Copper sulfate pentahydrate 2% 0       Tantal 1% 0       Titanium 0.1% 0       Manganese chloride 0.5% 0       Vanadium Pentoxide 10% 0       Other Omitted       Other Omitted                T.R.U.E. Test     Row Name 11/14/21 1300           Time Antigen Placed [place] # [phone]       Location Back       Number of Test 36       Reading Interval Day 5       Panel Panel 3;Panel 2;Panel 1       1. Nickel Sulfate 0       2. Wool Alcohols 0       3. Neomycin Sulfate 0       4. Potassium Dichromate 0       5. Caine Mix 0       6. Fragrance Mix 0       7. Colophony 0       8. Paraben Mix 0       9. Negative Control 0       10. Balsam of [place] 0       11. Ethylenediamine Dihydrochloride 0       12. Cobalt Dichloride 0       13. p-tert Butylphenol Formaldehyde Resin 0       14. Epoxy Resin 0       15. Carba Mix 0       16.  Black Rubber Mix 0       17. Cl+ Me-Isothiazolinone 0       18. Quaternium-15 0       19. Methyldibromo Glutaronitrile 0       20. p-Phenylenediamine 0       21. Formaldehyde 0       22. Mercapto Mix 0       23. Thimerosal 0  24. Thiuram Mix 0       25. Diazolidinyl Urea 0       26. Quinoline Mix 0       27. Tixocortol-21-Pivalate 0       28. Gold Sodium Thiosulfate 1       29. Imidazolidinyl Urea 0       30. Budesonide 0       31. Hydrocortisone-17-Butyrate 0       32. Mercaptobenzothiazole 0       33. Bacitracin 0       34. Parthenolide 0       35. Disperse Blue 106 0       36. 2-Bromo-2-Nitropropane-1,3-diol 0                   Plan:  Allergic contact dermatitis The patient has been provided detailed information regarding the substances she is sensitive to, as well as products containing the substances.  Meticulous avoidance of these substances is recommended. If avoidance is not possible, the use of barrier creams or lotions is recommended. Cobalt chloride hexahydrate 1% and Gold sodium thiosulfate positive on testing in copper sulfate and to hydrate 2% is equivocal Testing for titanium is negative  Prudy Feeler, MD [facility]

## 2021-11-17 ENCOUNTER — Other Ambulatory Visit: Payer: Self-pay | Admitting: Family Medicine

## 2021-11-17 DIAGNOSIS — Z5181 Encounter for therapeutic drug level monitoring: Secondary | ICD-10-CM | POA: Diagnosis not present

## 2021-11-17 DIAGNOSIS — Z79899 Other long term (current) drug therapy: Secondary | ICD-10-CM | POA: Diagnosis not present

## 2021-11-17 DIAGNOSIS — G894 Chronic pain syndrome: Secondary | ICD-10-CM | POA: Diagnosis not present

## 2021-11-20 ENCOUNTER — Other Ambulatory Visit: Payer: Self-pay | Admitting: Orthopedic Surgery

## 2021-11-21 ENCOUNTER — Other Ambulatory Visit: Payer: Self-pay | Admitting: Family Medicine

## 2021-11-21 LAB — SARS CORONAVIRUS 2 (TAT 6-24 HRS): SARS Coronavirus 2: NEGATIVE

## 2021-11-23 NOTE — Anesthesia Preprocedure Evaluation (Addendum)
Anesthesia Evaluation  Patient identified by MRN, date of birth, ID band Patient awake    Reviewed: Allergy & Precautions, NPO status , Patient's Chart, lab work & pertinent test results  History of Anesthesia Complications Negative for: history of anesthetic complications  Airway Mallampati: III  TM Distance: >3 FB Neck ROM: Full    Dental   Pulmonary asthma , sleep apnea ,    Pulmonary exam normal        Cardiovascular hypertension, Pt. on medications and Pt. on home beta blockers Normal cardiovascular exam+ dysrhythmias Atrial Fibrillation    Echo 2018: EF 55%, mild LVH, no RWMA, mild AR, mild MR Myoview 2020: No reversible ischemia. Small fixed apical artifact, previously seen in 2016. LVEF 61% with normal wall motion. This is a low risk study.   Neuro/Psych Anxiety Depression negative neurological ROS     GI/Hepatic Neg liver ROS, GERD  ,  Endo/Other  Morbid obesity  Renal/GU negative Renal ROS  negative genitourinary   Musculoskeletal  (+) Arthritis , Osteoarthritis and Rheumatoid disorders,  Fibromyalgia -  Abdominal   Peds  Hematology negative hematology ROS (+)   Anesthesia Other Findings   Reproductive/Obstetrics                            Anesthesia Physical Anesthesia Plan  ASA: 3  Anesthesia Plan: Spinal   Post-op Pain Management: Regional block, Tylenol PO (pre-op) and Ketamine IV   Induction:   PONV Risk Score and Plan: 2 and Propofol infusion, Treatment may vary due to age or medical condition, Ondansetron and TIVA  Airway Management Planned: Nasal Cannula and Simple Face Mask  Additional Equipment: None  Intra-op Plan:   Post-operative Plan:   Informed Consent: I have reviewed the patients History and Physical, chart, labs and discussed the procedure including the risks, benefits and alternatives for the proposed anesthesia with the patient or authorized  representative who has indicated his/her understanding and acceptance.       Plan Discussed with:   Anesthesia Plan Comments:        Anesthesia Quick Evaluation

## 2021-11-24 ENCOUNTER — Encounter (HOSPITAL_COMMUNITY): Payer: Self-pay | Admitting: Orthopedic Surgery

## 2021-11-24 ENCOUNTER — Ambulatory Visit (HOSPITAL_COMMUNITY): Payer: Medicare HMO | Admitting: Anesthesiology

## 2021-11-24 ENCOUNTER — Encounter (HOSPITAL_COMMUNITY)
Admission: RE | Disposition: A | Discharge status: Home / Self Care | Payer: Self-pay | Source: Home / Self Care | Attending: Orthopedic Surgery

## 2021-11-24 ENCOUNTER — Ambulatory Visit (HOSPITAL_COMMUNITY): Payer: Medicare HMO | Admitting: Physician Assistant

## 2021-11-24 ENCOUNTER — Observation Stay (HOSPITAL_COMMUNITY)
Admission: RE | Admit: 2021-11-24 | Discharge: 2021-11-26 | Disposition: A | Discharge status: Home / Self Care | Payer: Medicare HMO | Attending: Orthopedic Surgery | Admitting: Orthopedic Surgery

## 2021-11-24 DIAGNOSIS — J302 Other seasonal allergic rhinitis: Secondary | ICD-10-CM | POA: Diagnosis not present

## 2021-11-24 DIAGNOSIS — M1711 Unilateral primary osteoarthritis, right knee: Principal | ICD-10-CM | POA: Diagnosis present

## 2021-11-24 DIAGNOSIS — Z79899 Other long term (current) drug therapy: Secondary | ICD-10-CM | POA: Insufficient documentation

## 2021-11-24 DIAGNOSIS — I4819 Other persistent atrial fibrillation: Secondary | ICD-10-CM | POA: Diagnosis not present

## 2021-11-24 DIAGNOSIS — J449 Chronic obstructive pulmonary disease, unspecified: Secondary | ICD-10-CM | POA: Insufficient documentation

## 2021-11-24 DIAGNOSIS — Z91040 Latex allergy status: Secondary | ICD-10-CM | POA: Insufficient documentation

## 2021-11-24 DIAGNOSIS — G4733 Obstructive sleep apnea (adult) (pediatric): Secondary | ICD-10-CM | POA: Diagnosis not present

## 2021-11-24 DIAGNOSIS — Z6841 Body Mass Index (BMI) 40.0 and over, adult: Secondary | ICD-10-CM | POA: Diagnosis not present

## 2021-11-24 DIAGNOSIS — G8918 Other acute postprocedural pain: Secondary | ICD-10-CM | POA: Diagnosis not present

## 2021-11-24 DIAGNOSIS — M179 Osteoarthritis of knee, unspecified: Secondary | ICD-10-CM | POA: Diagnosis present

## 2021-11-24 HISTORY — PX: TOTAL KNEE ARTHROPLASTY: SHX125

## 2021-11-24 SURGERY — ARTHROPLASTY, KNEE, TOTAL
Anesthesia: Spinal | Site: Knee | Laterality: Right

## 2021-11-24 MED ORDER — HYDROMORPHONE HCL 2 MG PO TABS
4.0000 mg | ORAL_TABLET | ORAL | Status: DC | PRN
Start: 2021-11-24 — End: 2021-11-26
  Administered 2021-11-25 – 2021-11-26 (×7): 4 mg via ORAL
  Filled 2021-11-24 (×7): qty 2

## 2021-11-24 MED ORDER — MIDAZOLAM HCL 2 MG/2ML IJ SOLN
1.0000 mg | INTRAMUSCULAR | Status: DC
  Administered 2021-11-24: 08:00:00 2 mg via INTRAVENOUS
  Filled 2021-11-24: qty 2

## 2021-11-24 MED ORDER — FLEET ENEMA 7-19 GM/118ML RE ENEM: 1.0000 | ENEMA | Freq: Once | RECTAL | Status: DC | PRN

## 2021-11-24 MED ORDER — FLECAINIDE ACETATE 50 MG PO TABS
50.0000 mg | ORAL_TABLET | Freq: Two times a day (BID) | ORAL | Status: DC
  Administered 2021-11-24 – 2021-11-26 (×5): 50 mg via ORAL
  Filled 2021-11-24 (×5): qty 1

## 2021-11-24 MED ORDER — ATOMOXETINE HCL 60 MG PO CAPS
60.0000 mg | ORAL_CAPSULE | Freq: Every day | ORAL | Status: DC
  Filled 2021-11-24 (×2): qty 1

## 2021-11-24 MED ORDER — METHOCARBAMOL 500 MG IVPB - SIMPLE MED
500.0000 mg | Freq: Four times a day (QID) | INTRAVENOUS | Status: DC | PRN
  Administered 2021-11-24: 11:00:00 500 mg via INTRAVENOUS
  Filled 2021-11-24: qty 50

## 2021-11-24 MED ORDER — ONDANSETRON HCL 4 MG PO TABS: 4.0000 mg | ORAL_TABLET | Freq: Four times a day (QID) | ORAL | Status: DC | PRN

## 2021-11-24 MED ORDER — POLYETHYLENE GLYCOL 3350 17 G PO PACK: 17.0000 g | PACK | Freq: Every day | ORAL | Status: DC | PRN

## 2021-11-24 MED ORDER — SODIUM CHLORIDE (PF) 0.9 % IJ SOLN
INTRAMUSCULAR | Status: AC
  Filled 2021-11-24: qty 10

## 2021-11-24 MED ORDER — KETAMINE HCL 10 MG/ML IJ SOLN
INTRAMUSCULAR | Status: DC | PRN
  Administered 2021-11-24 (×2): 20 mg via INTRAVENOUS

## 2021-11-24 MED ORDER — OXYCODONE HCL 5 MG PO TABS
ORAL_TABLET | ORAL | Status: AC
  Filled 2021-11-24: qty 1

## 2021-11-24 MED ORDER — PHENYLEPHRINE HCL (PRESSORS) 10 MG/ML IV SOLN
INTRAVENOUS | Status: AC
  Filled 2021-11-24: qty 1

## 2021-11-24 MED ORDER — PROPOFOL 500 MG/50ML IV EMUL
INTRAVENOUS | Status: DC | PRN
  Administered 2021-11-24: 75 ug/kg/min via INTRAVENOUS

## 2021-11-24 MED ORDER — BUPIVACAINE LIPOSOME 1.3 % IJ SUSP: 20.0000 mL | Freq: Once | INTRAMUSCULAR | Status: DC

## 2021-11-24 MED ORDER — PHENOL 1.4 % MT LIQD: 1.0000 | OROMUCOSAL | Status: DC | PRN

## 2021-11-24 MED ORDER — ROPIVACAINE HCL 5 MG/ML IJ SOLN
INTRAMUSCULAR | Status: DC | PRN
  Administered 2021-11-24: 30 mL via PERINEURAL

## 2021-11-24 MED ORDER — AMISULPRIDE (ANTIEMETIC) 5 MG/2ML IV SOLN: 10.0000 mg | Freq: Once | INTRAVENOUS | Status: DC | PRN

## 2021-11-24 MED ORDER — METOPROLOL SUCCINATE ER 25 MG PO TB24
25.0000 mg | ORAL_TABLET | Freq: Every day | ORAL | Status: DC
  Administered 2021-11-24 – 2021-11-26 (×3): 25 mg via ORAL
  Filled 2021-11-24 (×3): qty 1

## 2021-11-24 MED ORDER — METHOCARBAMOL 500 MG IVPB - SIMPLE MED
INTRAVENOUS | Status: AC
  Filled 2021-11-24: qty 50

## 2021-11-24 MED ORDER — DEXAMETHASONE SODIUM PHOSPHATE 10 MG/ML IJ SOLN
10.0000 mg | Freq: Once | INTRAMUSCULAR | Status: AC
  Administered 2021-11-25: 11:00:00 10 mg via INTRAVENOUS
  Filled 2021-11-24: qty 1

## 2021-11-24 MED ORDER — PROPOFOL 1000 MG/100ML IV EMUL
INTRAVENOUS | Status: AC
  Filled 2021-11-24: qty 100

## 2021-11-24 MED ORDER — TRANEXAMIC ACID-NACL 1000-0.7 MG/100ML-% IV SOLN
1000.0000 mg | INTRAVENOUS | Status: AC
  Administered 2021-11-24: 09:00:00 1000 mg via INTRAVENOUS
  Filled 2021-11-24: qty 100

## 2021-11-24 MED ORDER — MENTHOL 3 MG MT LOZG: 1.0000 | LOZENGE | OROMUCOSAL | Status: DC | PRN

## 2021-11-24 MED ORDER — STERILE WATER FOR IRRIGATION IR SOLN
Status: DC | PRN
  Administered 2021-11-24: 2000 mL

## 2021-11-24 MED ORDER — BUDESONIDE 0.5 MG/2ML IN SUSP
0.5000 mg | Freq: Two times a day (BID) | RESPIRATORY_TRACT | Status: DC
  Administered 2021-11-25 (×2): 0.5 mg via RESPIRATORY_TRACT
  Filled 2021-11-24 (×4): qty 2

## 2021-11-24 MED ORDER — PANTOPRAZOLE SODIUM 40 MG PO TBEC
40.0000 mg | DELAYED_RELEASE_TABLET | Freq: Every day | ORAL | Status: DC
  Administered 2021-11-24 – 2021-11-26 (×3): 40 mg via ORAL
  Filled 2021-11-24 (×3): qty 1

## 2021-11-24 MED ORDER — 0.9 % SODIUM CHLORIDE (POUR BTL) OPTIME
TOPICAL | Status: DC | PRN
  Administered 2021-11-24: 09:00:00 1000 mL

## 2021-11-24 MED ORDER — ONDANSETRON HCL 4 MG/2ML IJ SOLN
INTRAMUSCULAR | Status: AC
  Filled 2021-11-24: qty 2

## 2021-11-24 MED ORDER — ACETAMINOPHEN 500 MG PO TABS: 1000.0000 mg | ORAL_TABLET | Freq: Once | ORAL | Status: DC

## 2021-11-24 MED ORDER — ONDANSETRON HCL 4 MG/2ML IJ SOLN
INTRAMUSCULAR | Status: DC | PRN
  Administered 2021-11-24: 4 mg via INTRAVENOUS

## 2021-11-24 MED ORDER — DOCUSATE SODIUM 100 MG PO CAPS
100.0000 mg | ORAL_CAPSULE | Freq: Two times a day (BID) | ORAL | Status: DC
  Administered 2021-11-24 – 2021-11-26 (×5): 100 mg via ORAL
  Filled 2021-11-24 (×5): qty 1

## 2021-11-24 MED ORDER — DIPHENHYDRAMINE HCL 12.5 MG/5ML PO ELIX
12.5000 mg | ORAL_SOLUTION | ORAL | Status: DC | PRN
  Administered 2021-11-26: 10:00:00 25 mg via ORAL
  Filled 2021-11-24: qty 10

## 2021-11-24 MED ORDER — FENTANYL CITRATE PF 50 MCG/ML IJ SOSY: 25.0000 ug | PREFILLED_SYRINGE | INTRAMUSCULAR | Status: DC | PRN

## 2021-11-24 MED ORDER — OXYCODONE HCL 5 MG PO TABS
10.0000 mg | ORAL_TABLET | ORAL | Status: DC | PRN
  Administered 2021-11-24 (×2): 15 mg via ORAL
  Administered 2021-11-25: 08:00:00 10 mg via ORAL
  Administered 2021-11-25 – 2021-11-26 (×3): 15 mg via ORAL
  Filled 2021-11-24: qty 3
  Filled 2021-11-24: qty 2
  Filled 2021-11-24 (×5): qty 3

## 2021-11-24 MED ORDER — POVIDONE-IODINE 10 % EX SWAB
2.0000 "application " | Freq: Once | CUTANEOUS | Status: AC
  Administered 2021-11-24: 2 via TOPICAL

## 2021-11-24 MED ORDER — LACTATED RINGERS IV SOLN: INTRAVENOUS | Status: DC

## 2021-11-24 MED ORDER — PREGABALIN 100 MG PO CAPS
200.0000 mg | ORAL_CAPSULE | Freq: Every day | ORAL | Status: DC
  Administered 2021-11-24 – 2021-11-25 (×2): 200 mg via ORAL
  Filled 2021-11-24 (×2): qty 2

## 2021-11-24 MED ORDER — ACETAMINOPHEN 10 MG/ML IV SOLN
1000.0000 mg | Freq: Four times a day (QID) | INTRAVENOUS | Status: DC
  Administered 2021-11-24: 09:00:00 1000 mg via INTRAVENOUS
  Filled 2021-11-24: qty 100

## 2021-11-24 MED ORDER — BUPIVACAINE IN DEXTROSE 0.75-8.25 % IT SOLN
INTRATHECAL | Status: DC | PRN
  Administered 2021-11-24: 1.6 mL via INTRATHECAL

## 2021-11-24 MED ORDER — ACETAMINOPHEN 500 MG PO TABS
1000.0000 mg | ORAL_TABLET | Freq: Four times a day (QID) | ORAL | Status: AC
  Administered 2021-11-24 – 2021-11-25 (×4): 1000 mg via ORAL
  Filled 2021-11-24 (×4): qty 2

## 2021-11-24 MED ORDER — OXYCODONE HCL 5 MG/5ML PO SOLN: 5.0000 mg | Freq: Once | ORAL | Status: AC | PRN

## 2021-11-24 MED ORDER — OXYCODONE HCL 5 MG PO TABS
5.0000 mg | ORAL_TABLET | ORAL | Status: DC | PRN
  Filled 2021-11-24: qty 2

## 2021-11-24 MED ORDER — LEVALBUTEROL HCL 0.63 MG/3ML IN NEBU: 0.6300 mg | INHALATION_SOLUTION | Freq: Four times a day (QID) | RESPIRATORY_TRACT | Status: DC | PRN

## 2021-11-24 MED ORDER — RIVAROXABAN 10 MG PO TABS
10.0000 mg | ORAL_TABLET | Freq: Every day | ORAL | Status: DC
  Administered 2021-11-25 – 2021-11-26 (×2): 10 mg via ORAL
  Filled 2021-11-24 (×2): qty 1

## 2021-11-24 MED ORDER — BISACODYL 10 MG RE SUPP: 10.0000 mg | Freq: Every day | RECTAL | Status: DC | PRN

## 2021-11-24 MED ORDER — CEFAZOLIN SODIUM-DEXTROSE 2-4 GM/100ML-% IV SOLN
2.0000 g | INTRAVENOUS | Status: AC
  Administered 2021-11-24: 09:00:00 2 g via INTRAVENOUS
  Filled 2021-11-24: qty 100

## 2021-11-24 MED ORDER — SODIUM CHLORIDE 0.9 % IR SOLN
Status: DC | PRN
  Administered 2021-11-24: 1000 mL

## 2021-11-24 MED ORDER — BUPIVACAINE LIPOSOME 1.3 % IJ SUSP
INTRAMUSCULAR | Status: AC
  Filled 2021-11-24: qty 20

## 2021-11-24 MED ORDER — SODIUM CHLORIDE 0.9 % IV SOLN: INTRAVENOUS | Status: DC

## 2021-11-24 MED ORDER — CLONAZEPAM 0.5 MG PO TABS: 0.5000 mg | ORAL_TABLET | Freq: Three times a day (TID) | ORAL | Status: DC | PRN

## 2021-11-24 MED ORDER — DEXAMETHASONE SODIUM PHOSPHATE 10 MG/ML IJ SOLN
INTRAMUSCULAR | Status: AC
  Filled 2021-11-24: qty 1

## 2021-11-24 MED ORDER — KETAMINE HCL-SODIUM CHLORIDE 100-0.9 MG/10ML-% IV SOSY
PREFILLED_SYRINGE | INTRAVENOUS | Status: AC
  Filled 2021-11-24: qty 10

## 2021-11-24 MED ORDER — FENTANYL CITRATE PF 50 MCG/ML IJ SOSY
50.0000 ug | PREFILLED_SYRINGE | INTRAMUSCULAR | Status: DC
  Administered 2021-11-24: 08:00:00 100 ug via INTRAVENOUS
  Filled 2021-11-24: qty 2

## 2021-11-24 MED ORDER — NITROGLYCERIN 0.4 MG SL SUBL: 0.4000 mg | SUBLINGUAL_TABLET | SUBLINGUAL | Status: DC | PRN

## 2021-11-24 MED ORDER — DEXAMETHASONE SODIUM PHOSPHATE 10 MG/ML IJ SOLN
8.0000 mg | Freq: Once | INTRAMUSCULAR | Status: AC
  Administered 2021-11-24: 09:00:00 8 mg via INTRAVENOUS

## 2021-11-24 MED ORDER — ONDANSETRON HCL 4 MG/2ML IJ SOLN: 4.0000 mg | Freq: Once | INTRAMUSCULAR | Status: DC | PRN

## 2021-11-24 MED ORDER — MIDAZOLAM HCL 2 MG/2ML IJ SOLN
INTRAMUSCULAR | Status: AC
  Filled 2021-11-24: qty 2

## 2021-11-24 MED ORDER — SODIUM CHLORIDE (PF) 0.9 % IJ SOLN
INTRAMUSCULAR | Status: DC | PRN
  Administered 2021-11-24: 60 mL

## 2021-11-24 MED ORDER — MORPHINE SULFATE (PF) 2 MG/ML IV SOLN
0.5000 mg | INTRAVENOUS | Status: DC | PRN
  Administered 2021-11-24: 14:00:00 1 mg via INTRAVENOUS
  Filled 2021-11-24: qty 1

## 2021-11-24 MED ORDER — LEVALBUTEROL TARTRATE 45 MCG/ACT IN AERO: 2.0000 | INHALATION_SPRAY | Freq: Four times a day (QID) | RESPIRATORY_TRACT | Status: DC | PRN

## 2021-11-24 MED ORDER — HYDROMORPHONE HCL 2 MG PO TABS
2.0000 mg | ORAL_TABLET | ORAL | Status: DC | PRN
  Administered 2021-11-24 – 2021-11-25 (×2): 2 mg via ORAL
  Filled 2021-11-24 (×2): qty 1

## 2021-11-24 MED ORDER — CEFAZOLIN SODIUM-DEXTROSE 2-4 GM/100ML-% IV SOLN
2.0000 g | Freq: Four times a day (QID) | INTRAVENOUS | Status: AC
  Administered 2021-11-24 (×2): 2 g via INTRAVENOUS
  Filled 2021-11-24 (×2): qty 100

## 2021-11-24 MED ORDER — PROPOFOL 10 MG/ML IV BOLUS
INTRAVENOUS | Status: DC | PRN
  Administered 2021-11-24: 10 mg via INTRAVENOUS
  Administered 2021-11-24: 5 mg via INTRAVENOUS

## 2021-11-24 MED ORDER — METHOCARBAMOL 500 MG PO TABS
500.0000 mg | ORAL_TABLET | Freq: Four times a day (QID) | ORAL | Status: DC | PRN
  Administered 2021-11-24 – 2021-11-26 (×6): 500 mg via ORAL
  Filled 2021-11-24 (×6): qty 1

## 2021-11-24 MED ORDER — ONDANSETRON HCL 4 MG/2ML IJ SOLN: 4.0000 mg | Freq: Four times a day (QID) | INTRAMUSCULAR | Status: DC | PRN

## 2021-11-24 MED ORDER — OXYCODONE HCL 5 MG PO TABS
5.0000 mg | ORAL_TABLET | Freq: Once | ORAL | Status: AC | PRN
  Administered 2021-11-24: 11:00:00 5 mg via ORAL

## 2021-11-24 MED ORDER — METOCLOPRAMIDE HCL 5 MG/ML IJ SOLN: 5.0000 mg | Freq: Three times a day (TID) | INTRAMUSCULAR | Status: DC | PRN

## 2021-11-24 MED ORDER — METOCLOPRAMIDE HCL 5 MG PO TABS: 5.0000 mg | ORAL_TABLET | Freq: Three times a day (TID) | ORAL | Status: DC | PRN

## 2021-11-24 MED ORDER — MIDAZOLAM HCL 2 MG/2ML IJ SOLN
INTRAMUSCULAR | Status: DC | PRN
  Administered 2021-11-24: 2 mg via INTRAVENOUS

## 2021-11-24 SURGICAL SUPPLY — 61 items
BAG COUNTER SPONGE SURGICOUNT (BAG) IMPLANT
BAG SPEC THK2 15X12 ZIP CLS (MISCELLANEOUS) ×1
BAG SPNG CNTER NS LX DISP (BAG)
BAG SURGICOUNT SPONGE COUNTING (BAG)
BAG ZIPLOCK 12X15 (MISCELLANEOUS) ×3 IMPLANT
BLADE SAG 18X100X1.27 (BLADE) ×3 IMPLANT
BLADE SAW SGTL 11.0X1.19X90.0M (BLADE) ×3 IMPLANT
BNDG ELASTIC 6X5.8 VLCR STR LF (GAUZE/BANDAGES/DRESSINGS) ×3 IMPLANT
BOWL SMART MIX CTS (DISPOSABLE) ×3 IMPLANT
BSPLAT TIB 5D D CMNT STM RT (Knees) ×1 IMPLANT
CEMENT HV SMART SET (Cement) ×6 IMPLANT
CLOSURE STERI-STRIP 1/2X4 (GAUZE/BANDAGES/DRESSINGS) ×1
CLOSURE WOUND 1/2 X4 (GAUZE/BANDAGES/DRESSINGS) ×2
CLSR STERI-STRIP ANTIMIC 1/2X4 (GAUZE/BANDAGES/DRESSINGS) ×1 IMPLANT
COMP FEM CMT PS STD 9 RT (Joint) ×3 IMPLANT
COMPONENT FEM CMT PS STD 9 RT (Joint) IMPLANT
COVER SURGICAL LIGHT HANDLE (MISCELLANEOUS) ×3 IMPLANT
CUFF TOURN SGL QUICK 34 (TOURNIQUET CUFF) ×3
CUFF TRNQT CYL 34X4.125X (TOURNIQUET CUFF) ×1 IMPLANT
DECANTER SPIKE VIAL GLASS SM (MISCELLANEOUS) ×3 IMPLANT
DRAPE INCISE IOBAN 66X45 STRL (DRAPES) ×3 IMPLANT
DRAPE U-SHAPE 47X51 STRL (DRAPES) ×3 IMPLANT
DRSG AQUACEL AG ADV 3.5X10 (GAUZE/BANDAGES/DRESSINGS) ×3 IMPLANT
DURAPREP 26ML APPLICATOR (WOUND CARE) ×3 IMPLANT
ELECT REM PT RETURN 15FT ADLT (MISCELLANEOUS) ×3 IMPLANT
GLOVE SRG 8 PF TXTR STRL LF DI (GLOVE) ×1 IMPLANT
GLOVE SURG ENC MOIS LTX SZ6.5 (GLOVE) ×3 IMPLANT
GLOVE SURG ENC MOIS LTX SZ8 (GLOVE) ×6 IMPLANT
GLOVE SURG UNDER POLY LF SZ7 (GLOVE) ×3 IMPLANT
GLOVE SURG UNDER POLY LF SZ8 (GLOVE) ×3
GLOVE SURG UNDER POLY LF SZ8.5 (GLOVE) ×3 IMPLANT
GOWN STRL REUS W/TWL LRG LVL3 (GOWN DISPOSABLE) ×6 IMPLANT
GOWN STRL REUS W/TWL XL LVL3 (GOWN DISPOSABLE) ×3 IMPLANT
HANDPIECE INTERPULSE COAX TIP (DISPOSABLE) ×3
HDLS TROCR DRIL PIN KNEE 75 (PIN) ×8
HOLDER FOLEY CATH W/STRAP (MISCELLANEOUS) IMPLANT
IMMOBILIZER KNEE 20 (SOFTGOODS) ×3
IMMOBILIZER KNEE 20 THIGH 36 (SOFTGOODS) ×1 IMPLANT
INSERT TIBIAL VE R CD6-9X10 (Insert) ×2 IMPLANT
KIT TURNOVER KIT A (KITS) IMPLANT
MANIFOLD NEPTUNE II (INSTRUMENTS) ×3 IMPLANT
NS IRRIG 1000ML POUR BTL (IV SOLUTION) ×3 IMPLANT
PACK TOTAL KNEE CUSTOM (KITS) ×3 IMPLANT
PADDING CAST COTTON 6X4 STRL (CAST SUPPLIES) ×4 IMPLANT
PIN DRILL HDLS TROCAR 75 4PK (PIN) IMPLANT
PROTECTOR NERVE ULNAR (MISCELLANEOUS) ×3 IMPLANT
SCREW FEMALE HEX FIX 25X2.5 (ORTHOPEDIC DISPOSABLE SUPPLIES) ×2 IMPLANT
SET HNDPC FAN SPRY TIP SCT (DISPOSABLE) ×1 IMPLANT
STEM POLY PAT PLY 32M KNEE (Knees) ×2 IMPLANT
STEM TIBIA 5 DEG SZ D R KNEE (Knees) IMPLANT
STRIP CLOSURE SKIN 1/2X4 (GAUZE/BANDAGES/DRESSINGS) ×4 IMPLANT
SUT MNCRL AB 4-0 PS2 18 (SUTURE) ×3 IMPLANT
SUT STRATAFIX 0 PDS 27 VIOLET (SUTURE) ×3
SUT VIC AB 2-0 CT1 27 (SUTURE) ×9
SUT VIC AB 2-0 CT1 TAPERPNT 27 (SUTURE) ×3 IMPLANT
SUTURE STRATFX 0 PDS 27 VIOLET (SUTURE) ×1 IMPLANT
TIBIA STEM 5 DEG SZ D R KNEE (Knees) ×3 IMPLANT
TRAY FOLEY MTR SLVR 16FR STAT (SET/KITS/TRAYS/PACK) ×3 IMPLANT
TUBE SUCTION HIGH CAP CLEAR NV (SUCTIONS) ×3 IMPLANT
WATER STERILE IRR 1000ML POUR (IV SOLUTION) ×6 IMPLANT
WRAP KNEE MAXI GEL POST OP (GAUZE/BANDAGES/DRESSINGS) ×3 IMPLANT

## 2021-11-24 NOTE — Interval H&P Note (Signed)
History and Physical Interval Note:  11/24/2021 6:27 AM  Dawn Jordan  has presented today for surgery, with the diagnosis of right knee osteoarthritis.  The various methods of treatment have been discussed with the patient and family. After consideration of risks, benefits and other options for treatment, the patient has consented to  Procedure(s): TOTAL KNEE ARTHROPLASTY (Right) as a surgical intervention.  The patient's history has been reviewed, patient examined, no change in status, stable for surgery.  I have reviewed the patient's chart and labs.  Questions were answered to the patient's satisfaction.     Pilar Plate Nekesha Font

## 2021-11-24 NOTE — Anesthesia Procedure Notes (Signed)
Spinal  Patient location during procedure: OR Reason for block: surgical anesthesia Staffing Performed: anesthesiologist  Anesthesiologist: Lidia Collum, MD Preanesthetic Checklist Completed: patient identified, IV checked, risks and benefits discussed, surgical consent, monitors and equipment checked, pre-op evaluation and timeout performed Spinal Block Patient position: sitting Prep: DuraPrep and site prepped and draped Patient monitoring: continuous pulse ox, blood pressure and heart rate Approach: midline Location: L3-4 Injection technique: single-shot Needle Needle type: Pencan and Tuohy  Needle gauge: 24 G Needle length: 10 cm Assessment Events: CSF return Additional Notes Functioning IV was confirmed and monitors were applied. Sterile prep and drape, including hand hygiene and sterile gloves were used. The patient was positioned and the spine was prepped. The skin was anesthetized with lidocaine.  CSE technique using 17ga Tuohy with LOR syringe. LOR at 9 cm. Spinal needle placed through Tuohy and free flow of clear CSF obtained prior to injecting local anesthetic into the CSF. The needle was carefully withdrawn. The patient tolerated the procedure well.

## 2021-11-24 NOTE — Anesthesia Postprocedure Evaluation (Signed)
Anesthesia Post Note  Patient: Dawn Jordan  Procedure(s) Performed: TOTAL KNEE ARTHROPLASTY (Right: Knee)     Patient location during evaluation: PACU Anesthesia Type: Spinal Level of consciousness: oriented and awake and alert Pain management: pain level controlled Vital Signs Assessment: post-procedure vital signs reviewed and stable Respiratory status: spontaneous breathing, respiratory function stable and nonlabored ventilation Cardiovascular status: blood pressure returned to baseline and stable Postop Assessment: no headache, no backache, no apparent nausea or vomiting and spinal receding Anesthetic complications: no   No notable events documented.  Last Vitals:  Vitals:   11/24/21 1052 11/24/21 1100  BP:  136/81  Pulse: (!) 55 (!) 52  Resp: 12 11  Temp:  36.6 C  SpO2: 100% 100%    Last Pain:  Vitals:   11/24/21 1127  TempSrc:   PainSc: 5                  Lidia Collum

## 2021-11-24 NOTE — Plan of Care (Signed)
°  Problem: Education: Goal: Knowledge of the prescribed therapeutic regimen will improve Outcome: Progressing Goal: Individualized Educational Video(s) Outcome: Progressing   Problem: Activity: Goal: Ability to avoid complications of mobility impairment will improve Outcome: Progressing Goal: Range of joint motion will improve Outcome: Progressing   Problem: Clinical Measurements: Goal: Postoperative complications will be avoided or minimized Outcome: Progressing   Problem: Pain Management: Goal: Pain level will decrease with appropriate interventions Outcome: Not Progressing   Problem: Skin Integrity: Goal: Will show signs of wound healing Outcome: Progressing

## 2021-11-24 NOTE — Evaluation (Signed)
Physical Therapy Evaluation Patient Details Name: Dawn Jordan MRN: [id number] DOB: [birth year] Today's Date: 11/24/2021  History of Present Illness  Pt is 55 yo nonbinary admitted on 11/24/21 for R TKA.  Pt with hx including arthritis bil knee, obesity, RA, afib, OSA, fibromyalgia, PTSD, ADHD, bil knee arthroscopy  Clinical Impression  Pt is s/p TKA resulting in the deficits listed below (see PT Problem List). At baseline, pt is independent.  She lives in [place] with elevator to enter.  Pt's spouse will be with her for several days and then a friend is coming to assist.  Attempted to see pt earlier this afternoon but pain was severe, pain now a 5/10 and pt agreeable to PT.  Pt with good quad activation and able to ambulate 20' in room.  Her O2 was 99% on RA so left on RA and notified RN.  Pt expected to progress well as pain controlled. Pt will benefit from skilled PT to increase their independence and safety with mobility to allow discharge to the venue listed below.       Recommendations for follow up therapy are one component of a multi-disciplinary discharge planning process, led by the attending physician.  Recommendations may be updated based on patient status, additional functional criteria and insurance authorization.  Follow Up Recommendations Follow physician's recommendations for discharge plan and follow up therapies    Assistance Recommended at Discharge Frequent or constant Supervision/Assistance  Functional Status Assessment Patient has had a recent decline in their functional status and demonstrates the ability to make significant improvements in function in a reasonable and predictable amount of time.  Equipment Recommendations  Rolling walker (2 wheels)    Recommendations for Other Services       Precautions / Restrictions Precautions Precautions: Fall Required Braces or Orthoses: Knee Immobilizer - Right Knee Immobilizer - Right: Discontinue once straight leg raise with  < 10 degree lag Restrictions Weight Bearing Restrictions: Yes RLE Weight Bearing: Weight bearing as tolerated      Mobility  Bed Mobility Overal bed mobility: Needs Assistance Bed Mobility: Supine to Sit     Supine to sit: Min assist     General bed mobility comments: Min A for R LE    Transfers Overall transfer level: Needs assistance Equipment used: Rolling walker (2 wheels) Transfers: Sit to/from Stand Sit to Stand: Min guard           General transfer comment: Min guard for sfety , cued for hand placmenet and R LE management; increased time/effort to stand    Ambulation/Gait Ambulation/Gait assistance: Min guard Gait Distance (Feet): 20 Feet Assistive device: Rolling walker (2 wheels) Gait Pattern/deviations: Step-to pattern;Decreased stride length;Decreased weight shift to right Gait velocity: decreased     General Gait Details: Antalgic but steady gait with RW; able to bear some weight on R; cued for sequencing and RW  Stairs            Wheelchair Mobility    Modified Rankin (Stroke Patients Only)       Balance Overall balance assessment: Needs assistance Sitting-balance support: No upper extremity supported Sitting balance-Leahy Scale: Good     Standing balance support: Bilateral upper extremity supported;Reliant on assistive device for balance Standing balance-Leahy Scale: Poor Standing balance comment: steady  with RW                             Pertinent Vitals/Pain Pain Assessment: 0-10 Pain Score: 5  Pain  Location: R knee Pain Descriptors / Indicators: Discomfort Pain Intervention(s): Limited activity within patient's tolerance;Monitored during session    Home Living Family/patient expects to be discharged to:: Private residence Living Arrangements:  (spouse, Dawn Jordan, and their 52 yo foster son) Available Help at Discharge: Available 24 hours/day;Family;Friend(s) Type of Home: Apartment (condo) Home Access: Elevator        Home Layout: One level (Long hallway to get to apt) Home Equipment: Rollator (4 wheels)      Prior Function Prior Level of Function : Independent/Modified Independent;Driving             Mobility Comments: Could ambulate in community ADLs Comments: Independent with ADLs, IADLs, takes care of toddler during the day     Hand Dominance        Extremity/Trunk Assessment   Upper Extremity Assessment Upper Extremity Assessment: Overall WFL for tasks assessed    Lower Extremity Assessment Lower Extremity Assessment: LLE deficits/detail;RLE deficits/detail RLE Deficits / Details: Expected post op changers; ROM : ankle and hip WFL, knee 0 to 50 degrees; MMT: ankle 5/5, hip and knee 3/5 not further tested RLE Sensation: WNL LLE Deficits / Details: ROM WFL; MMT 5/5; does report also need TKA LLE Sensation: WNL    Cervical / Trunk Assessment Cervical / Trunk Assessment: Normal  Communication   Communication: No difficulties  Cognition Arousal/Alertness: Awake/alert Behavior During Therapy: WFL for tasks assessed/performed Overall Cognitive Status: Within Functional Limits for tasks assessed                                          General Comments      Exercises     Assessment/Plan    PT Assessment Patient needs continued PT services  PT Problem List Decreased strength;Decreased mobility;Decreased safety awareness;Decreased range of motion;Decreased activity tolerance;Decreased balance;Pain       PT Treatment Interventions DME instruction;Therapeutic activities;Modalities;Gait training;Therapeutic exercise;Patient/family education;Stair training;Functional mobility training;Balance training    PT Goals (Current goals can be found in the Care Plan section)  Acute Rehab PT Goals Patient Stated Goal: return home; keep up with 55 yo PT Goal Formulation: With patient/family Time For Goal Achievement: 12/08/21 Potential to Achieve Goals: Good     Frequency 7X/week   Barriers to discharge        Co-evaluation               AM-PAC PT "6 Clicks" Mobility  Outcome Measure Help needed turning from your back to your side while in a flat bed without using bedrails?: None Help needed moving from lying on your back to sitting on the side of a flat bed without using bedrails?: A Little Help needed moving to and from a bed to a chair (including a wheelchair)?: A Little Help needed standing up from a chair using your arms (e.g., wheelchair or bedside chair)?: A Little Help needed to walk in hospital room?: A Little Help needed climbing 3-5 steps with a railing? : A Little 6 Click Score: 19    End of Session Equipment Utilized During Treatment: Gait belt Activity Tolerance: Patient tolerated treatment well Patient left: in chair;with call bell/phone within reach;with family/visitor present (Pt's partner staying - did not place alarm) Nurse Communication: Mobility status;Other (comment) (O2 sats 99% - left on RA) PT Visit Diagnosis: Other abnormalities of gait and mobility (R26.89);Muscle weakness (generalized) (M62.81);Pain Pain - Right/Left: Right Pain - part of  body: Knee    Time: 0102-7253 PT Time Calculation (min) (ACUTE ONLY): 33 min   Charges:   PT Evaluation $PT Eval Low Complexity: 1 Low PT Treatments $Therapeutic Activity: 8-22 mins        Abran Richard, PT Acute Rehab Services Pager [phone] [facility] [phone]   Karlton Lemon 11/24/2021, 6:03 PM

## 2021-11-24 NOTE — Op Note (Signed)
OPERATIVE REPORT-TOTAL KNEE ARTHROPLASTY   Pre-operative diagnosis- Osteoarthritis  Right knee(s)  Post-operative diagnosis- Osteoarthritis Right knee(s)  Procedure-  Right  Total Knee Arthroplasty (Zimmer Person[employer] Titanium knee)  Surgeon- Dione Plover. Alezandra Egli, MD  Assistant- Grover Canavan, PA-C   Anesthesia-   Adductor canal block and spinal  EBL- 25 ml   Drains None  Tourniquet time-  Total Tourniquet Time Documented: Thigh (Right) - 42 minutes Total: Thigh (Right) - 42 minutes     Complications- None  Condition-PACU - hemodynamically stable.   Brief Clinical Note  Dawn Jordan is a 55 y.o. year old adult with end stage OA of her right knee with progressively worsening pain and dysfunction. She has constant pain, with activity and at rest and significant functional deficits with difficulties even with ADLs. She has had extensive non-op management including analgesics, injections of cortisone and viscosupplements, and home exercise program, but remains in significant pain with significant dysfunction.Radiographs show bone on bone arthritis medial and patellofemoral. She presents now for right Total Knee Arthroplasty.     Procedure in detail---   The patient is brought into the operating room and positioned supine on the operating table. After successful administration of  Adductor canal block and spinal,   a tourniquet is placed high on the  Right thigh(s) and the lower extremity is prepped and draped in the usual sterile fashion. Time out is performed by the operating team and then the  Right lower extremity is wrapped in Esmarch, knee flexed and the tourniquet inflated to 300 mmHg.       A midline incision is made with a ten blade through the subcutaneous tissue to the level of the extensor mechanism. A fresh blade is used to make a medial parapatellar arthrotomy. Soft tissue over the proximal medial tibia is subperiosteally elevated to the joint line with a knife and into the  semimembranosus bursa with a Cobb elevator. Soft tissue over the proximal lateral tibia is elevated with attention being paid to avoiding the patellar tendon on the tibial tubercle. The patella is everted, knee flexed 90 degrees and the ACL and PCL are removed. Findings are bone on bone medial and patellofemoral with large global osteophytes        The drill is used to create a starting hole in the distal femur and the canal is thoroughly irrigated with sterile saline to remove the fatty contents. The 5 degree Right  valgus alignment guide is placed into the femoral canal and the distal femoral cutting block is pinned to remove 10 mm off the distal femur. Resection is made with an oscillating saw.      The tibia is subluxed forward and the menisci are removed. The extramedullary alignment guide is placed referencing proximally at the medial aspect of the tibial tubercle and distally along the second metatarsal axis and tibial crest. The block is pinned to remove 49mm off the more deficient medial  side. Resection is made with an oscillating saw. Size D is the most appropriate size for the tibia and the proximal tibia is prepared with the modular drill and keel punch for that size.      The femoral sizing guide is placed and size 9 is most appropriate. Rotation is marked off the epicondylar axis and confirmed by creating a rectangular flexion gap at 90 degrees. The size 9 cutting block is pinned in this rotation and the anterior, posterior and chamfer cuts are made with the oscillating saw. The intercondylar block is then placed  and that cut is made.      Trial size D tibial component, trial size 9 posterior stabilized femur and a 10  mm posterior stabilized fixed bearing insert trial is placed. Full extension is achieved with excellent varus/valgus and anterior/posterior balance throughout full range of motion. The patella is everted and thickness measured to be 23  mm. Free hand resection is taken to 14 mm, a 32  template is placed, lug holes are drilled, trial patella is placed, and it tracks normally. Osteophytes are removed off the posterior femur with the trial in place. All trials are removed and the cut bone surfaces prepared with pulsatile lavage. Cement is mixed and once ready for implantation, the size D tibial implant, size  9 posterior stabilized femoral component, and the size 32 patella are cemented in place and the patella is held with the clamp. The trial insert is placed and the knee held in full extension. The Exparel (20 ml mixed with 60 ml saline) is injected into the extensor mechanism, posterior capsule, medial and lateral gutters and subcutaneous tissues.  All extruded cement is removed and once the cement is hard the permanent 10 mm posterior stabilized rotating platform insert is placed into the tibial tray.      The wound is copiously irrigated with saline solution and the extensor mechanism closed with # 0 Stratofix suture. The tourniquet is released for a total tourniquet time of 42  minutes. Flexion against gravity is 140 degrees and the patella tracks normally. Subcutaneous tissue is closed with 2.0 vicryl and subcuticular with running 4.0 Monocryl. The incision is cleaned and dried and steri-strips and a bulky sterile dressing are applied. The limb is placed into a knee immobilizer and the patient is awakened and transported to recovery in stable condition.      Please note that a surgical assistant was a medical necessity for this procedure in order to perform it in a safe and expeditious manner. Surgical assistant was necessary to retract the ligaments and vital neurovascular structures to prevent injury to them and also necessary for proper positioning of the limb to allow for anatomic placement of the prosthesis.   Dione Plover Durant Scibilia, MD    11/24/2021, 9:34 AM

## 2021-11-24 NOTE — Progress Notes (Signed)
AssistedDr. Carolyn Witman with right, ultrasound guided, adductor canal block. Side rails up, monitors on throughout procedure. See vital signs in flow sheet. Tolerated Procedure well.  

## 2021-11-24 NOTE — Transfer of Care (Signed)
Immediate Anesthesia Transfer of Care Note  Patient: Dawn Jordan  Procedure(s) Performed: TOTAL KNEE ARTHROPLASTY (Right: Knee)  Patient Location: PACU  Anesthesia Type:Spinal  Level of Consciousness: awake  Airway & Oxygen Therapy: Patient Spontanous Breathing and Patient connected to nasal cannula oxygen  Post-op Assessment: Report given to RN and Post -op Vital signs reviewed and stable  Post vital signs: Reviewed and stable  Last Vitals:  Vitals Value Taken Time  BP 140/76 11/24/21 1005  Temp    Pulse 72 11/24/21 1007  Resp 14 11/24/21 1007  SpO2 100 % 11/24/21 1007  Vitals shown include unvalidated device data.  Last Pain:  Vitals:   11/24/21 0805  TempSrc:   PainSc: Asleep      Patients Stated Pain Goal: 4 (69/43/70 0525)  Complications: No notable events documented.

## 2021-11-24 NOTE — Progress Notes (Signed)
Orthopedic Tech Progress Note Patient Details:  Dawn Jordan [birth year] [id number]  CPM Left Knee Left Knee Flexion (Degrees): 10 Left Knee Extension (Degrees): 40 CPM Right Knee CPM Right Knee: On  Post Interventions Patient Tolerated: Well  Dawn Jordan Dawn Jordan 11/24/2021, 10:52 AM

## 2021-11-24 NOTE — Anesthesia Procedure Notes (Signed)
Anesthesia Regional Block: Adductor canal block   Pre-Anesthetic Checklist: , timeout performed,  Correct Patient, Correct Site, Correct Laterality,  Correct Procedure, Correct Position, site marked,  Risks and benefits discussed,  Surgical consent,  Pre-op evaluation,  At surgeon's request and post-op pain management  Laterality: Right  Prep: chloraprep       Needles:  Injection technique: Single-shot  Needle Type: Echogenic Stimulator Needle     Needle Length: 10cm  Needle Gauge: 20     Additional Needles:   Procedures:,,,, ultrasound used (permanent image in chart),,    Narrative:  Start time: 11/24/2021 7:58 AM End time: 11/24/2021 8:02 AM Injection made incrementally with aspirations every 5 mL.  Performed by: Personally  Anesthesiologist: Lidia Collum, MD  Additional Notes: Standard monitors applied. Skin prepped. Good needle visualization with ultrasound. Injection made in 5cc increments with no resistance to injection. Patient tolerated the procedure well.

## 2021-11-24 NOTE — Care Plan (Signed)
Ortho Bundle Case Management Note  Patient Details  Name: Dawn Jordan MRN: [id number] Date of Birth: [birth year]  R TKA on 11-24-21 DCP:  Home with spouse DME:  RW ordered through [facility] PT:  [facility] on 11-27-21                   DME Arranged:  Gilford Rile rolling DME Agency:  [employer]  HH Arranged:  NA [place] Agency:  NA  Additional Comments: Please contact me with any questions of if this plan should need to change.  Marianne Sofia, RN,CCM [facility]  [phone] 11/24/2021, 5:07 PM

## 2021-11-24 NOTE — Discharge Instructions (Addendum)
 Dawn Aluisio, MD Total Joint Specialist [facility] [address] [place],  [address] [phone]  TOTAL KNEE REPLACEMENT POSTOPERATIVE DIRECTIONS    Knee Rehabilitation, Guidelines Following Surgery  Results after knee surgery are often greatly improved when you follow the exercise, range of motion and muscle strengthening exercises prescribed by your doctor. Safety measures are also important to protect the knee from further injury. If any of these exercises cause you to have increased pain or swelling in your knee joint, decrease the amount until you are comfortable again and slowly increase them. If you have problems or questions, call your caregiver or physical therapist for advice.   BLOOD CLOT PREVENTION Take a 10 mg Xarelto once a day for three weeks following surgery. Then take an 81 mg Aspirin once a day for three weeks. Then discontinue Aspirin. You may resume your vitamins/supplements once you have discontinued the Xarelto. Do not take any NSAIDs (Advil, Aleve, Ibuprofen, Meloxicam, etc.) until you have discontinued the Xarelto.   HOME CARE INSTRUCTIONS  Remove items at home which could result in a fall. This includes throw rugs or furniture in walking pathways.  ICE to the affected knee as much as tolerated. Icing helps control swelling. If the swelling is well controlled you will be more comfortable and rehab easier. Continue to use ice on the knee for pain and swelling from surgery. You may notice swelling that will progress down to the foot and ankle. This is normal after surgery. Elevate the leg when you are not up walking on it.    Continue to use the breathing machine which will help keep your temperature down. It is common for your temperature to cycle up and down following surgery, especially at night when you are not up moving around and exerting yourself. The breathing machine keeps your lungs expanded and your temperature  down. Do not place pillow under the operative knee, focus on keeping the knee straight while resting  DIET You may resume your previous home diet once you are discharged from the hospital.  DRESSING / WOUND CARE / SHOWERING Keep your bulky bandage on for 2 days. On the third post-operative day you may remove the Ace bandage and gauze. There is a waterproof adhesive bandage on your skin which will stay in place until your first follow-up appointment. Once you remove this you will not need to place another bandage You may begin showering 3 days following surgery, but do not submerge the incision under water.  ACTIVITY For the first 5 days, the key is rest and control of pain and swelling Do your home exercises twice a day starting on post-operative day 3. On the days you go to physical therapy, just do the home exercises once that day. You should rest, ice and elevate the leg for 50 minutes out of every hour. Get up and walk/stretch for 10 minutes per hour. After 5 days you can increase your activity slowly as tolerated. Walk with your walker as instructed. Use the walker until you are comfortable transitioning to a cane. Walk with the cane in the opposite hand of the operative leg. You may discontinue the cane once you are comfortable and walking steadily. Avoid periods of inactivity such as sitting longer than an hour when not asleep. This helps prevent blood clots.  You may discontinue the knee immobilizer once you are able to perform a straight leg raise while lying down. You may resume a sexual relationship in one month or   when given the OK by your doctor.  You may return to work once you are cleared by your doctor.  Do not drive a car for 6 weeks or until released by your surgeon.  Do not drive while taking narcotics.  TED HOSE STOCKINGS Wear the elastic stockings on both legs for three weeks following surgery during the day. You may remove them at night for sleeping.  WEIGHT  BEARING Weight bearing as tolerated with assist device (walker, cane, etc) as directed, use it as long as suggested by your surgeon or therapist, typically at least 4-6 weeks.  POSTOPERATIVE CONSTIPATION PROTOCOL Constipation - defined medically as fewer than three stools per week and severe constipation as less than one stool per week.  One of the most common issues patients have following surgery is constipation.  Even if you have a regular bowel pattern at home, your normal regimen is likely to be disrupted due to multiple reasons following surgery.  Combination of anesthesia, postoperative narcotics, change in appetite and fluid intake all can affect your bowels.  In order to avoid complications following surgery, here are some recommendations in order to help you during your recovery period.  Colace (docusate) - Pick up an over-the-counter form of Colace or another stool softener and take twice a day as long as you are requiring postoperative pain medications.  Take with a full glass of water daily.  If you experience loose stools or diarrhea, hold the colace until you stool forms back up. If your symptoms do not get better within 1 week or if they get worse, check with your doctor. Dulcolax (bisacodyl) - Pick up over-the-counter and take as directed by the product packaging as needed to assist with the movement of your bowels.  Take with a full glass of water.  Use this product as needed if not relieved by Colace only.  MiraLax (polyethylene glycol) - Pick up over-the-counter to have on hand. MiraLax is a solution that will increase the amount of water in your bowels to assist with bowel movements.  Take as directed and can mix with a glass of water, juice, soda, coffee, or tea. Take if you go more than two days without a movement. Do not use MiraLax more than once per day. Call your doctor if you are still constipated or irregular after using this medication for 7 days in a row.  If you continue  to have problems with postoperative constipation, please contact the office for further assistance and recommendations.  If you experience "the worst abdominal pain ever" or develop nausea or vomiting, please contact the office immediatly for further recommendations for treatment.  ITCHING If you experience itching with your medications, try taking only a single pain pill, or even half a pain pill at a time.  You can also use Benadryl over the counter for itching or also to help with sleep.   MEDICATIONS See your medication summary on the "After Visit Summary" that the nursing staff will review with you prior to discharge.  You may have some home medications which will be placed on hold until you complete the course of blood thinner medication.  It is important for you to complete the blood thinner medication as prescribed by your surgeon.  Continue your approved medications as instructed at time of discharge.  PRECAUTIONS If you experience chest pain or shortness of breath - call 911 immediately for transfer to the hospital emergency department.  If you develop a fever greater that 101 F, purulent   drainage from wound, increased redness or drainage from wound, foul odor from the wound/dressing, or calf pain - CONTACT YOUR SURGEON.                                                   FOLLOW-UP APPOINTMENTS Make sure you keep all of your appointments after your operation with your surgeon and caregivers. You should call the office at the above phone number and make an appointment for approximately two weeks after the date of your surgery or on the date instructed by your surgeon outlined in the "After Visit Summary".  RANGE OF MOTION AND STRENGTHENING EXERCISES  Rehabilitation of the knee is important following a knee injury or an operation. After just a few days of immobilization, the muscles of the thigh which control the knee become weakened and shrink (atrophy). Knee exercises are designed to build up  the tone and strength of the thigh muscles and to improve knee motion. Often times heat used for twenty to thirty minutes before working out will loosen up your tissues and help with improving the range of motion but do not use heat for the first two weeks following surgery. These exercises can be done on a training (exercise) mat, on the floor, on a table or on a bed. Use what ever works the best and is most comfortable for you Knee exercises include:  Leg Lifts - While your knee is still immobilized in a splint or cast, you can do straight leg raises. Lift the leg to 60 degrees, hold for 3 sec, and slowly lower the leg. Repeat 10-20 times 2-3 times daily. Perform this exercise against resistance later as your knee gets better.  Quad and Hamstring Sets - Tighten up the muscle on the front of the thigh (Quad) and hold for 5-10 sec. Repeat this 10-20 times hourly. Hamstring sets are done by pushing the foot backward against an object and holding for 5-10 sec. Repeat as with quad sets.  Leg Slides: Lying on your back, slowly slide your foot toward your buttocks, bending your knee up off the floor (only go as far as is comfortable). Then slowly slide your foot back down until your leg is flat on the floor again. Angel Wings: Lying on your back spread your legs to the side as far apart as you can without causing discomfort.  A rehabilitation program following serious knee injuries can speed recovery and prevent re-injury in the future due to weakened muscles. Contact your doctor or a physical therapist for more information on knee rehabilitation.   POST-OPERATIVE OPIOID TAPER INSTRUCTIONS: It is important to wean off of your opioid medication as soon as possible. If you do not need pain medication after your surgery it is ok to stop day one. Opioids include: Codeine, Hydrocodone(Norco, Vicodin), Oxycodone(Percocet, oxycontin) and hydromorphone amongst others.  Long term and even short term use of opiods can  cause: Increased pain response Dependence Constipation Depression Respiratory depression And more.  Withdrawal symptoms can include Flu like symptoms Nausea, vomiting And more Techniques to manage these symptoms Hydrate well Eat regular healthy meals Stay active Use relaxation techniques(deep breathing, meditating, yoga) Do Not substitute Alcohol to help with tapering If you have been on opioids for less than two weeks and do not have pain than it is ok to stop all together.  Plan to   wean off of opioids This plan should start within one week post op of your joint replacement. Maintain the same interval or time between taking each dose and first decrease the dose.  Cut the total daily intake of opioids by one tablet each day Next start to increase the time between doses. The last dose that should be eliminated is the evening dose.   IF YOU ARE TRANSFERRED TO A SKILLED REHAB FACILITY If the patient is transferred to a skilled rehab facility following release from the hospital, a list of the current medications will be sent to the facility for the patient to continue.  When discharged from the skilled rehab facility, please have the facility set up the patient's Home Health Physical Therapy prior to being released. Also, the skilled facility will be responsible for providing the patient with their medications at time of release from the facility to include their pain medication, the muscle relaxants, and their blood thinner medication. If the patient is still at the rehab facility at time of the two week follow up appointment, the skilled rehab facility will also need to assist the patient in arranging follow up appointment in our office and any transportation needs.  MAKE SURE YOU:  Understand these instructions.  Get help right away if you are not doing well or get worse.   DENTAL ANTIBIOTICS:  In most cases prophylactic antibiotics for Dental procdeures after total joint surgery are  not necessary.  Exceptions are as follows:  1. History of prior total joint infection  2. Severely immunocompromised (Organ Transplant, cancer chemotherapy, Rheumatoid biologic meds such as Humera)  3. Poorly controlled diabetes (A1C &gt; 8.0, blood glucose over 200)  If you have one of these conditions, contact your surgeon for an antibiotic prescription, prior to your dental procedure.    Pick up stool softner and laxative for home use following surgery while on pain medications. Do not submerge incision under water. Please use good hand washing techniques while changing dressing each day. May shower starting three days after surgery. Please use a clean towel to pat the incision dry following showers. Continue to use ice for pain and swelling after surgery. Do not use any lotions or creams on the incision until instructed by your surgeon.  Information on my medicine - XARELTO (Rivaroxaban)  Why was Xarelto prescribed for you? Xarelto was prescribed for you to reduce the risk of blood clots forming after orthopedic surgery. The medical term for these abnormal blood clots is venous thromboembolism (VTE).  What do you need to know about xarelto ? Take your Xarelto ONCE DAILY at the same time every day. You may take it either with or without food.  If you have difficulty swallowing the tablet whole, you may crush it and mix in applesauce just prior to taking your dose.  Take Xarelto exactly as prescribed by your doctor and DO NOT stop taking Xarelto without talking to the doctor who prescribed the medication.  Stopping without other VTE prevention medication to take the place of Xarelto may increase your risk of developing a clot.  After discharge, you should have regular check-up appointments with your healthcare provider that is prescribing your Xarelto.    What do you do if you miss a dose? If you miss a dose, take it as soon as you remember on the same day then  continue your regularly scheduled once daily regimen the next day. Do not take two doses of Xarelto on the same day.   Important   Safety Information A possible side effect of Xarelto is bleeding. You should call your healthcare provider right away if you experience any of the following: Bleeding from an injury or your nose that does not stop. Unusual colored urine (red or dark brown) or unusual colored stools (red or black). Unusual bruising for unknown reasons. A serious fall or if you hit your head (even if there is no bleeding).  Some medicines may interact with Xarelto and might increase your risk of bleeding while on Xarelto. To help avoid this, consult your healthcare provider or pharmacist prior to using any new prescription or non-prescription medications, including herbals, vitamins, non-steroidal anti-inflammatory drugs (NSAIDs) and supplements.  This website has more information on Xarelto: www.xarelto.com.    

## 2021-11-25 DIAGNOSIS — I4819 Other persistent atrial fibrillation: Secondary | ICD-10-CM | POA: Diagnosis not present

## 2021-11-25 DIAGNOSIS — G4733 Obstructive sleep apnea (adult) (pediatric): Secondary | ICD-10-CM | POA: Diagnosis not present

## 2021-11-25 DIAGNOSIS — M1711 Unilateral primary osteoarthritis, right knee: Secondary | ICD-10-CM | POA: Diagnosis not present

## 2021-11-25 DIAGNOSIS — Z79899 Other long term (current) drug therapy: Secondary | ICD-10-CM | POA: Diagnosis not present

## 2021-11-25 DIAGNOSIS — Z6841 Body Mass Index (BMI) 40.0 and over, adult: Secondary | ICD-10-CM | POA: Diagnosis not present

## 2021-11-25 DIAGNOSIS — Z91040 Latex allergy status: Secondary | ICD-10-CM | POA: Diagnosis not present

## 2021-11-25 DIAGNOSIS — J449 Chronic obstructive pulmonary disease, unspecified: Secondary | ICD-10-CM | POA: Diagnosis not present

## 2021-11-25 LAB — CBC
HCT: 33.7 % — ABNORMAL LOW (ref 36.0–46.0)
Hemoglobin: 11.4 g/dL — ABNORMAL LOW (ref 12.0–15.0)
MCH: 29.8 pg (ref 26.0–34.0)
MCHC: 33.8 g/dL (ref 30.0–36.0)
MCV: 88.2 fL (ref 80.0–100.0)
Platelets: 189 10*3/uL (ref 150–400)
RBC: 3.82 MIL/uL — ABNORMAL LOW (ref 3.87–5.11)
RDW: 12.6 % (ref 11.5–15.5)
WBC: 16 10*3/uL — ABNORMAL HIGH (ref 4.0–10.5)
nRBC: 0 % (ref 0.0–0.2)

## 2021-11-25 LAB — BASIC METABOLIC PANEL
Anion gap: 5 (ref 5–15)
BUN: 17 mg/dL (ref 6–20)
CO2: 27 mmol/L (ref 22–32)
Calcium: 8.9 mg/dL (ref 8.9–10.3)
Chloride: 108 mmol/L (ref 98–111)
Creatinine, Ser: 1.11 mg/dL — ABNORMAL HIGH (ref 0.44–1.00)
GFR, Estimated: 59 mL/min — ABNORMAL LOW (ref >60)
Glucose, Bld: 182 mg/dL — ABNORMAL HIGH (ref 70–99)
Potassium: 4.8 mmol/L (ref 3.5–5.1)
Sodium: 140 mmol/L (ref 135–145)

## 2021-11-25 MED ORDER — RIVAROXABAN 10 MG PO TABS
10.0000 mg | ORAL_TABLET | Freq: Every day | ORAL | 0 refills | Status: AC
Start: 2021-11-26 — End: 2021-12-16

## 2021-11-25 MED ORDER — METHOCARBAMOL 500 MG PO TABS
500.0000 mg | ORAL_TABLET | Freq: Four times a day (QID) | ORAL | 0 refills | Status: DC | PRN
Start: 2021-11-25 — End: 2022-11-10

## 2021-11-25 MED ORDER — HYDROMORPHONE HCL 2 MG PO TABS
2.0000 mg | ORAL_TABLET | Freq: Four times a day (QID) | ORAL | 0 refills | Status: DC | PRN
Start: 2021-11-25 — End: 2022-03-05

## 2021-11-25 NOTE — Progress Notes (Addendum)
Subjective: 1 Day Post-Op Procedure(s) (LRB): TOTAL KNEE ARTHROPLASTY (Right) Patient reports pain as moderate.   Patient seen in rounds by Dr. Wynelle Link. Patient had issues with pain control last night. On chronic oxycodone at home, though denies taking this on a daily basis. Switched to PO dilaudid last night with some improvement. Denies chest pain, SOB, or calf pain. Foley catheter removed this AM.  We will continue therapy today, ambulated 20' yesterday.   Objective: Vital signs in last 24 hours: Temp:  [97.6 F (36.4 C)-98.4 F (36.9 C)] 98.3 F (36.8 C) (12/20 0735) Pulse Rate:  [52-73] 63 (12/20 0735) Resp:  [11-16] 16 (12/20 0735) BP: (95-147)/(63-95) 119/80 (12/20 0735) SpO2:  [95 %-100 %] 98 % (12/20 0735)  Intake/Output from previous day:  Intake/Output Summary (Last 24 hours) at 11/25/2021 0804 Last data filed at 11/25/2021 0962 Gross per 24 hour  Intake 2748.82 ml  Output 1725 ml  Net 1023.82 ml     Intake/Output this shift: No intake/output data recorded.  Labs: Recent Labs    11/25/21 0350  HGB 11.4*   Recent Labs    11/25/21 0350  WBC 16.0*  RBC 3.82*  HCT 33.7*  PLT 189   Recent Labs    11/25/21 0350  NA 140  K 4.8  CL 108  CO2 27  BUN 17  CREATININE 1.11*  GLUCOSE 182*  CALCIUM 8.9   No results for input(s): LABPT, INR in the last 72 hours.  Exam: General - Patient is Alert and Oriented Extremity - Neurologically intact Neurovascular intact Sensation intact distally Dorsiflexion/Plantar flexion intact Dressing - dressing C/D/I Motor Function - intact, moving foot and toes well on exam.   Past Medical History:  Diagnosis Date   A-fib ([place])    ADD (attention deficit disorder)    Anemia    Anginal pain ([facility])    Anxiety    Asthma    Back pain    Bone spur of foot    Chest pain    Chronic fatigue syndrome    Complication of anesthesia    woke up during sugery once, and also with epidural at [facility] [address] had episode with epidural   Depression    Dyspnea    Dysrhythmia    AFIB   Fibromyalgia    GERD (gastroesophageal reflux disease)    Headache    History of gout    History of kidney stones    Hypertension    IBS (irritable bowel syndrome)    Joint pain    Left ankle pain    Left sciatic nerve pain    Myofascial pain    Neck pain    OSA (obstructive sleep apnea)    cpap   Palpitations    Rheumatoid arthritis ([facility])    Seasonal allergies     Assessment/Plan: 1 Day Post-Op Procedure(s) (LRB): TOTAL KNEE ARTHROPLASTY (Right) Principal Problem:   OA (osteoarthritis) of knee Active Problems:   Primary osteoarthritis of right knee  Estimated body mass index is 40.34 kg/m as calculated from the following:   Height as of this encounter: 5\' 4"  (1.626 m).   Weight as of this encounter: 106.6 kg. Advance diet Up with therapy D/C IV fluids   Patient's anticipated LOS is less than 2 midnights, meeting these requirements: - Younger than 85 - Lives within 1 hour of care - Has a competent adult at home to recover with post-op recover - NO history of  - Chronic pain  requiring opiods  - Diabetes  - Coronary Artery Disease  - Heart failure  - Heart attack  - Stroke  - DVT/VTE  - Respiratory Failure/COPD  - Renal failure  - Anemia  - Advanced Liver disease  DVT Prophylaxis - Xarelto Weight bearing as tolerated. Continue therapy.  Plan is to go Home after hospital stay. Possible discharge later today if progresses with therapy and pain better controlled. Scheduled for OPPT at [facility]. Follow-up in the office in 2 weeks.  The PDMP database was reviewed today prior to any opioid medications being prescribed to this patient.  Theresa Duty, PA-C Orthopedic Surgery [phone] 11/25/2021, 8:04 AM

## 2021-11-25 NOTE — Plan of Care (Signed)
  Problem: Activity: Goal: Ability to avoid complications of mobility impairment will improve Outcome: Progressing   Problem: Clinical Measurements: Goal: Postoperative complications will be avoided or minimized Outcome: Progressing   Problem: Pain Management: Goal: Pain level will decrease with appropriate interventions Outcome: Progressing   Problem: Skin Integrity: Goal: Will show signs of wound healing Outcome: Progressing   

## 2021-11-25 NOTE — Progress Notes (Signed)
Chaplain engaged in an initial visit with Dawn Jordan.  Chaplain and Dawn Jordan spent some time talking about Financial controller.  Dawn Jordan shared about why it is so important to have an Scientist, physiological.  Dawn Jordan shared about the passing of her god brother and what the process felt and looked like.  Dawn Jordan believes having an Advanced Directive would have made a difference.  Because of that experience Dawn Jordan has conversations with those closest to her about completing an Advanced Directive and having those important discussions.  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   Chaplain provided presence, listening and support. Dawn Jordan has a copy on the Advanced Directive.  Dawn Jordan would like to assign her spouse as her healthcare agent.  Chaplain explained that marriage is a legally binding agreement that does mean medical staff will reach out to the spouse first.     11/25/21 1300  Clinical Encounter Type  Visited With Patient  Visit Type Initial;Spiritual support;Social support

## 2021-11-25 NOTE — Progress Notes (Signed)
Physical Therapy Treatment Patient Details Name: Dawn Jordan MRN: [id number] DOB: Aug 22, 1966 Today's Date: 11/25/2021   History of Present Illness Pt is 55 yo nonbinary admitted on 11/24/21 for R TKA.  Pt with hx including arthritis bil knee, obesity, RA, afib, OSA, fibromyalgia, PTSD, ADHD, bil knee arthroscopy    PT Comments    POD # 1 pm session Assisted out of recliner to amb.  General transfer comment: 25% VC's on proper hand placement and safety with turns.  Also required increased time. Also assisted with a toilet transfer with VC's on safety. General Gait Details: slow but steady gait but limited by increased c/o pain.  Only amb to and from bathroom. General bed mobility comments: demonstarted and instructed how to use a belt to self assist LE Pt did NOT meet her mobility goals due to PAIN level and ability to control.     Recommendations for follow up therapy are one component of a multi-disciplinary discharge planning process, led by the attending physician.  Recommendations may be updated based on patient status, additional functional criteria and insurance authorization.  Follow Up Recommendations  Outpatient PT     Assistance Recommended at Discharge    Equipment Recommendations  Rolling walker (2 wheels)    Recommendations for Other Services       Precautions / Restrictions Precautions Precautions: Fall Precaution Comments: instructed no pillow under knee Restrictions Weight Bearing Restrictions: No     Mobility  Bed Mobility Overal bed mobility: Needs Assistance Bed Mobility: Sit to Supine     Supine to sit: Min assist Sit to supine: Min assist   General bed mobility comments: demonstarted and instructed how to use a belt to self assist LE    Transfers Overall transfer level: Needs assistance Equipment used: Rolling walker (2 wheels) Transfers: Sit to/from Stand Sit to Stand: Min guard;Min assist           General transfer comment: 25% VC's on  proper hand placement and safety with turns.  Also required increased time. Also assisted with a toilet transfer with VC's on safety.    Ambulation/Gait Ambulation/Gait assistance: Min guard Gait Distance (Feet): 22 Feet (11 feet x 2 to and from bathroom) Assistive device: Rolling walker (2 wheels) Gait Pattern/deviations: Step-to pattern;Decreased stride length;Decreased weight shift to right Gait velocity: decreased     General Gait Details: slow but steady gait but limited by increased c/o pain.  Only amb to and from bathroom.   Stairs Stairs:  (No stairs or curb to enter CONDO)           Engineer, building services Rankin (Stroke Patients Only)       Balance                                            Cognition Arousal/Alertness: Awake/alert Behavior During Therapy: WFL for tasks assessed/performed Overall Cognitive Status: Within Functional Limits for tasks assessed                                 General Comments: AxO x 3 very pleasant        Exercises  Unable to perform due to increased pain level     General Comments        Pertinent Vitals/Pain Pain Assessment: 0-10 Pain Location: R knee  Pain Descriptors / Indicators: Discomfort;Operative site guarding;Grimacing;Sore;Tender;Throbbing Pain Intervention(s): Monitored during session;Premedicated before session;Repositioned;Ice applied    Home Living                          Prior Function            PT Goals (current goals can now be found in the care plan section) Progress towards PT goals: Progressing toward goals    Frequency    7X/week      PT Plan Current plan remains appropriate    Co-evaluation              AM-PAC PT "6 Clicks" Mobility   Outcome Measure  Help needed turning from your back to your side while in a flat bed without using bedrails?: A Little Help needed moving from lying on your back to sitting on the side  of a flat bed without using bedrails?: A Little Help needed moving to and from a bed to a chair (including a wheelchair)?: A Little Help needed standing up from a chair using your arms (e.g., wheelchair or bedside chair)?: A Little Help needed to walk in hospital room?: A Little Help needed climbing 3-5 steps with a railing? : A Lot 6 Click Score: 17    End of Session Equipment Utilized During Treatment: Gait belt Activity Tolerance: Patient limited by pain;Patient limited by fatigue Patient left: in bed Nurse Communication: Mobility status PT Visit Diagnosis: Other abnormalities of gait and mobility (R26.89);Muscle weakness (generalized) (M62.81);Pain Pain - Right/Left: Right Pain - part of body: Knee     Time: 1352-1418 PT Time Calculation (min) (ACUTE ONLY): 26 min  Charges:  $Gait Training: 8-22 mins $Therapeutic Activity: 8-22 mins                     {Tylisha Danis  PTA Acute  Rehabilitation Services Pager      [phone] Office      [phone]

## 2021-11-25 NOTE — Progress Notes (Signed)
Physical Therapy Treatment Patient Details Name: Dawn Jordan MRN: [id number] DOB: May 19, 1966 Today's Date: 11/25/2021   History of Present Illness Pt is 55 yo nonbinary admitted on 11/24/21 for R TKA.  Pt with hx including arthritis bil knee, obesity, RA, afib, OSA, fibromyalgia, PTSD, ADHD, bil knee arthroscopy    PT Comments    POD # 1 am session Assisted OOB .  General bed mobility comments: demonstarted and instructed how to use a belt to self assist LE.  General transfer comment: 25% VC's on proper hand placement and safety with turns.  Also required increased time. General Gait Details: slow but steady gait but limited by increased c/o pain.  Recommendations for follow up therapy are one component of a multi-disciplinary discharge planning process, led by the attending physician.  Recommendations may be updated based on patient status, additional functional criteria and insurance authorization.  Follow Up Recommendations  Outpatient PT     Assistance Recommended at Discharge    Equipment Recommendations  Rolling walker (2 wheels)    Recommendations for Other Services       Precautions / Restrictions Precautions Precautions: Fall Precaution Comments: instructed no pillow under knee Restrictions Weight Bearing Restrictions: No     Mobility  Bed Mobility Overal bed mobility: Needs Assistance Bed Mobility: Supine to Sit     Supine to sit: Min assist     General bed mobility comments: demonstarted and instructed how to use a belt to self assist LE    Transfers Overall transfer level: Needs assistance Equipment used: Rolling walker (2 wheels) Transfers: Sit to/from Stand Sit to Stand: Min guard;Min assist           General transfer comment: 25% VC's on proper hand placement and safety with turns.  Also required increased time.    Ambulation/Gait Ambulation/Gait assistance: Min guard Gait Distance (Feet): 55 Feet Assistive device: Rolling walker (2  wheels) Gait Pattern/deviations: Step-to pattern;Decreased stride length;Decreased weight shift to right Gait velocity: decreased     General Gait Details: slow but steady gait but limited by increased c/o pain.   Stairs Stairs:  (No stairs or curb to enter CONDO)           Engineer, building services Rankin (Stroke Patients Only)       Balance                                            Cognition Arousal/Alertness: Awake/alert Behavior During Therapy: WFL for tasks assessed/performed Overall Cognitive Status: Within Functional Limits for tasks assessed                                 General Comments: AxO x 3 very pleasant        Exercises  Unable to perform pain level 10/10 after walking    General Comments        Pertinent Vitals/Pain Pain Assessment: 0-10 Pain Location: R knee Pain Descriptors / Indicators: Discomfort;Operative site guarding;Grimacing;Sore;Tender;Throbbing Pain Intervention(s): Monitored during session;Premedicated before session;Repositioned;Ice applied    Home Living                          Prior Function            PT Goals (current goals can now be found  in the care plan section) Progress towards PT goals: Progressing toward goals    Frequency    7X/week      PT Plan Current plan remains appropriate    Co-evaluation              AM-PAC PT "6 Clicks" Mobility   Outcome Measure  Help needed turning from your back to your side while in a flat bed without using bedrails?: A Little Help needed moving from lying on your back to sitting on the side of a flat bed without using bedrails?: A Little Help needed moving to and from a bed to a chair (including a wheelchair)?: A Little Help needed standing up from a chair using your arms (e.g., wheelchair or bedside chair)?: A Little Help needed to walk in hospital room?: A Little Help needed climbing 3-5 steps with a railing?  : A Lot 6 Click Score: 17    End of Session Equipment Utilized During Treatment: Gait belt Activity Tolerance: Patient limited by pain;Patient limited by fatigue Patient left: in chair;with call bell/phone within reach Nurse Communication: Mobility status PT Visit Diagnosis: Other abnormalities of gait and mobility (R26.89);Muscle weakness (generalized) (M62.81);Pain Pain - Right/Left: Right Pain - part of body: Knee     Time: 1000-1030 PT Time Calculation (min) (ACUTE ONLY): 30 min  Charges:  $Gait Training: 8-22 mins $Therapeutic Activity: 8-22 mins                    Rica Koyanagi  PTA Acute  Rehabilitation Services Pager      [phone] Office      [phone]

## 2021-11-25 NOTE — TOC Transition Note (Signed)
Transition of Care [facility]) - CM/SW Discharge Note  Patient Details  Name: Dawn Jordan MRN: [id number] Date of Birth: 09/15/1966  [facility]) CM/SW Contact:  Sherie Don, LCSW Phone Number: 11/25/2021, 9:32 AM  Clinical Narrative: Patient is expected to discharge home after working with PT. CSW met with patient to confirm discharge plan. Patient will discharge home with OPPT at [facility]. Patient has elevated toilets at home, but will need a rolling walker. [employer] delivered walker to patient's room. Patient asked about living will paperwork, so consult was made to chaplain. TOC signing off.  Final next level of care: OP Rehab Barriers to Discharge: No Barriers Identified  Patient Goals and CMS Choice Patient states their goals for this hospitalization and ongoing recovery are:: Discharge home with OPPT and get paperwork for living will CMS Medicare.gov Compare Post Acute Care list provided to:: Patient Choice offered to / list presented to : Patient  Discharge Plan and Services       DME Arranged: Walker rolling DME Agency: [employer] Representative spoke with at DME Agency: Prearranged in orthopedist's office HH Arranged: NA [place] Agency: NA  Readmission Risk Interventions No flowsheet data found.

## 2021-11-25 NOTE — Progress Notes (Signed)
Chaplain let nurse know that paperwork for Advanced Directive, Healthcare POA is on the unit and available to the patient.  Nurse stated she would provide paperwork to [facility].  Chaplain will provide some education over paperwork.     11/25/21 1100  Clinical Encounter Type  Visited With Health care provider  Visit Type Initial;Social support

## 2021-11-26 ENCOUNTER — Encounter (HOSPITAL_COMMUNITY): Payer: Self-pay | Admitting: Orthopedic Surgery

## 2021-11-26 DIAGNOSIS — M1711 Unilateral primary osteoarthritis, right knee: Secondary | ICD-10-CM | POA: Diagnosis not present

## 2021-11-26 DIAGNOSIS — Z91040 Latex allergy status: Secondary | ICD-10-CM | POA: Diagnosis not present

## 2021-11-26 DIAGNOSIS — Z6841 Body Mass Index (BMI) 40.0 and over, adult: Secondary | ICD-10-CM | POA: Diagnosis not present

## 2021-11-26 DIAGNOSIS — G4733 Obstructive sleep apnea (adult) (pediatric): Secondary | ICD-10-CM | POA: Diagnosis not present

## 2021-11-26 DIAGNOSIS — J449 Chronic obstructive pulmonary disease, unspecified: Secondary | ICD-10-CM | POA: Diagnosis not present

## 2021-11-26 DIAGNOSIS — I4819 Other persistent atrial fibrillation: Secondary | ICD-10-CM | POA: Diagnosis not present

## 2021-11-26 DIAGNOSIS — Z96651 Presence of right artificial knee joint: Secondary | ICD-10-CM | POA: Diagnosis not present

## 2021-11-26 DIAGNOSIS — Z79899 Other long term (current) drug therapy: Secondary | ICD-10-CM | POA: Diagnosis not present

## 2021-11-26 LAB — CBC
HCT: 32.7 % — ABNORMAL LOW (ref 36.0–46.0)
Hemoglobin: 11.1 g/dL — ABNORMAL LOW (ref 12.0–15.0)
MCH: 30.2 pg (ref 26.0–34.0)
MCHC: 33.9 g/dL (ref 30.0–36.0)
MCV: 88.9 fL (ref 80.0–100.0)
Platelets: 181 10*3/uL (ref 150–400)
RBC: 3.68 MIL/uL — ABNORMAL LOW (ref 3.87–5.11)
RDW: 12.7 % (ref 11.5–15.5)
WBC: 16.1 10*3/uL — ABNORMAL HIGH (ref 4.0–10.5)
nRBC: 0 % (ref 0.0–0.2)

## 2021-11-26 LAB — BASIC METABOLIC PANEL
Anion gap: 4 — ABNORMAL LOW (ref 5–15)
BUN: 19 mg/dL (ref 6–20)
CO2: 27 mmol/L (ref 22–32)
Calcium: 8.6 mg/dL — ABNORMAL LOW (ref 8.9–10.3)
Chloride: 108 mmol/L (ref 98–111)
Creatinine, Ser: 0.87 mg/dL (ref 0.44–1.00)
GFR, Estimated: >60 mL/min (ref >60)
Glucose, Bld: 131 mg/dL — ABNORMAL HIGH (ref 70–99)
Potassium: 4.5 mmol/L (ref 3.5–5.1)
Sodium: 139 mmol/L (ref 135–145)

## 2021-11-26 NOTE — Plan of Care (Signed)
°  Problem: Education: Goal: Knowledge of the prescribed therapeutic regimen will improve Outcome: Progressing   Problem: Clinical Measurements: Goal: Postoperative complications will be avoided or minimized Outcome: Progressing   

## 2021-11-26 NOTE — Plan of Care (Signed)
  Problem: Activity: Goal: Ability to avoid complications of mobility impairment will improve Outcome: Progressing   Problem: Clinical Measurements: Goal: Postoperative complications will be avoided or minimized Outcome: Progressing   Problem: Pain Management: Goal: Pain level will decrease with appropriate interventions Outcome: Progressing   Problem: Skin Integrity: Goal: Will show signs of wound healing Outcome: Progressing   

## 2021-11-26 NOTE — Progress Notes (Signed)
Physical Therapy Treatment Patient Details Name: Dawn Jordan MRN: [id number] DOB: 12-30-1965 Today's Date: 11/26/2021   History of Present Illness Pt is 55 yo nonbinary admitted on 11/24/21 for R TKA.  Pt with hx including arthritis bil knee, obesity, RA, afib, OSA, fibromyalgia, PTSD, ADHD, bil knee arthroscopy    PT Comments    POD # 2 Pt requesting to nap till after 11 am then do her PT.  AxO x 3 very pleasant.  Assisted OOB to amb hallway.  General bed mobility comments: demonstarted and instructed how to use a belt to self assist LE self able with increased time.  General transfer comment: self able with increased time.  Also assisted with a toilet transfer. General Gait Details: tolerated an increased distance in hallway and to and from bathroom.  Then returned to room to perform some TE's following HEP handout.  Instructed on proper tech, freq as well as use of ICE.   Addressed all mobility questions, discussed appropriate activity, educated on use of ICE.  Pt ready for D/C to home. Pt has met her mobility goals and pain control to D/C to home after one PT session.   Recommendations for follow up therapy are one component of a multi-disciplinary discharge planning process, led by the attending physician.  Recommendations may be updated based on patient status, additional functional criteria and insurance authorization.  Follow Up Recommendations        Assistance Recommended at Discharge Frequent or constant Supervision/Assistance  Equipment Recommendations       Recommendations for Other Services       Precautions / Restrictions Precautions Precautions: Fall Precaution Comments: instructed no pillow under knee Restrictions Weight Bearing Restrictions: No RLE Weight Bearing: Weight bearing as tolerated     Mobility  Bed Mobility Overal bed mobility: Needs Assistance Bed Mobility: Supine to Sit;Sit to Supine     Supine to sit: Min guard Sit to supine: Min guard    General bed mobility comments: demonstarted and instructed how to use a belt to self assist LE self able with increased time    Transfers Overall transfer level: Needs assistance Equipment used: Rolling walker (2 wheels) Transfers: Sit to/from Stand Sit to Stand: Supervision;Min guard           General transfer comment: self able with increased time.  Also assisted with a toilet transfer.    Ambulation/Gait Ambulation/Gait assistance: Supervision Gait Distance (Feet): 32 Feet Assistive device: Rolling walker (2 wheels) Gait Pattern/deviations: Step-to pattern;Decreased stride length;Decreased weight shift to right Gait velocity: decreased     General Gait Details: tolerated an increased distance in hallway and to and from bathroom   Stairs Stairs:  (no stairs to enter CONDO)           Wheelchair Mobility    Modified Rankin (Stroke Patients Only)       Balance                                            Cognition Arousal/Alertness: Awake/alert Behavior During Therapy: WFL for tasks assessed/performed Overall Cognitive Status: Within Functional Limits for tasks assessed                                 General Comments: AxO x 3 very pleasant        Exercises  Total Knee Replacement TE's following HEP handout 10 reps B LE ankle pumps 05 reps towel squeezes 05 reps knee presses 05 reps heel slides  05 reps SAQ's 05 reps SLR's 05 reps ABD Educated on use of gait belt to assist with TE's Followed by ICE     General Comments        Pertinent Vitals/Pain Pain Assessment: 0-10 Pain Score: 5  Pain Location: R knee Pain Descriptors / Indicators: Discomfort;Operative site guarding;Grimacing;Sore;Tender;Throbbing Pain Intervention(s): Monitored during session;Premedicated before session;Repositioned;Ice applied    Home Living                          Prior Function            PT Goals (current goals  can now be found in the care plan section) Progress towards PT goals: Progressing toward goals    Frequency    7X/week      PT Plan Current plan remains appropriate    Co-evaluation              AM-PAC PT "6 Clicks" Mobility   Outcome Measure  Help needed turning from your back to your side while in a flat bed without using bedrails?: A Little Help needed moving from lying on your back to sitting on the side of a flat bed without using bedrails?: A Little Help needed moving to and from a bed to a chair (including a wheelchair)?: A Little Help needed standing up from a chair using your arms (e.g., wheelchair or bedside chair)?: A Little Help needed to walk in hospital room?: A Little Help needed climbing 3-5 steps with a railing? : A Little 6 Click Score: 18    End of Session Equipment Utilized During Treatment: Gait belt Activity Tolerance: Patient tolerated treatment well Patient left: in bed;with call bell/phone within reach Nurse Communication: Mobility status PT Visit Diagnosis: Other abnormalities of gait and mobility (R26.89);Muscle weakness (generalized) (M62.81);Pain Pain - Right/Left: Right Pain - part of body: Knee     Time: 2080-2233 PT Time Calculation (min) (ACUTE ONLY): 39 min  Charges:  $Gait Training: 8-22 mins $Therapeutic Exercise: 8-22 mins $Therapeutic Activity: 8-22 mins                     {Senita Corredor  PTA Acute  Rehabilitation Services Pager      [phone] Office      [phone]

## 2021-11-26 NOTE — Progress Notes (Signed)
° °  Subjective: 2 Days Post-Op Procedure(s) (LRB): TOTAL KNEE ARTHROPLASTY (Right) Patient reports pain as mild.   Patient seen in rounds for Dr. Wynelle Link. Patient is well, and has had no acute complaints or problems. Pain improved this AM. Denies chest pain or SOB. Plan is to go Home after hospital stay.  Objective: Vital signs in last 24 hours: Temp:  [97.9 F (36.6 C)-98.6 F (37 C)] 98 F (36.7 C) (12/21 0544) Pulse Rate:  [68-74] 74 (12/21 0544) Resp:  [16-18] 16 (12/21 0544) BP: (114-134)/(60-83) 114/60 (12/21 0544) SpO2:  [97 %-100 %] 99 % (12/21 0544)  Intake/Output from previous day:  Intake/Output Summary (Last 24 hours) at 11/26/2021 0931 Last data filed at 11/26/2021 0800 Gross per 24 hour  Intake 1200 ml  Output 400 ml  Net 800 ml    Intake/Output this shift: No intake/output data recorded.  Labs: Recent Labs    11/25/21 0350 11/26/21 0347  HGB 11.4* 11.1*   Recent Labs    11/25/21 0350 11/26/21 0347  WBC 16.0* 16.1*  RBC 3.82* 3.68*  HCT 33.7* 32.7*  PLT 189 181   Recent Labs    11/25/21 0350 11/26/21 0347  NA 140 139  K 4.8 4.5  CL 108 108  CO2 27 27  BUN 17 19  CREATININE 1.11* 0.87  GLUCOSE 182* 131*  CALCIUM 8.9 8.6*   No results for input(s): LABPT, INR in the last 72 hours.  Exam: General - Patient is Alert and Oriented Extremity - Neurologically intact Neurovascular intact Sensation intact distally Dorsiflexion/Plantar flexion intact Dressing/Incision - clean, dry, and intact. Bulky dressing removed, Aquacel in place. Motor Function - intact, moving foot and toes well on exam.   Past Medical History:  Diagnosis Date   A-fib ([place])    ADD (attention deficit disorder)    Anemia    Anginal pain ([facility])    Anxiety    Asthma    Back pain    Bone spur of foot    Chest pain    Chronic fatigue syndrome    Complication of anesthesia    woke up during sugery once, and also with epidural at [facility] [address] had  episode with epidural   Depression    Dyspnea    Dysrhythmia    AFIB   Fibromyalgia    GERD (gastroesophageal reflux disease)    Headache    History of gout    History of kidney stones    Hypertension    IBS (irritable bowel syndrome)    Joint pain    Left ankle pain    Left sciatic nerve pain    Myofascial pain    Neck pain    OSA (obstructive sleep apnea)    cpap   Palpitations    Rheumatoid arthritis (HCC)    Seasonal allergies     Assessment/Plan: 2 Days Post-Op Procedure(s) (LRB): TOTAL KNEE ARTHROPLASTY (Right) Principal Problem:   OA (osteoarthritis) of knee Active Problems:   Primary osteoarthritis of right knee  Estimated body mass index is 40.34 kg/m as calculated from the following:   Height as of this encounter: 5\' 4"  (1.626 m).   Weight as of this encounter: 106.6 kg. Up with therapy  DVT Prophylaxis - Xarelto Weight-bearing as tolerated  Plan for discharge later today once cleared by PT.  Theresa Duty, PA-C Orthopedic Surgery [phone] 11/26/2021, 9:31 AM

## 2021-11-26 NOTE — Progress Notes (Signed)
Provided discharge education/instructions, all questions and concerns addressed, Pt not in acute distress, RW in the room. Pt discharged home with belongings accompanied by wife.

## 2021-11-28 DIAGNOSIS — M25561 Pain in right knee: Secondary | ICD-10-CM | POA: Diagnosis not present

## 2021-12-02 NOTE — Discharge Summary (Addendum)
Patient ID: Dawn Jordan MRN: [id number] DOB/AGE: 1966-10-03 55 y.o.  Admit date: 11/24/2021 Discharge date: 11/26/2021  Admission Diagnoses:  Principal Problem:   OA (osteoarthritis) of knee Active Problems:   Primary osteoarthritis of right knee   Discharge Diagnoses:  Same  Past Medical History:  Diagnosis Date   A-fib ([place])    ADD (attention deficit disorder)    Anemia    Anginal pain (HCC)    Anxiety    Asthma    Back pain    Bone spur of foot    Chest pain    Chronic fatigue syndrome    Complication of anesthesia    woke up during sugery once, and also with epidural at surgery [facility] on [address] had episode with epidural   Depression    Dyspnea    Dysrhythmia    AFIB   Fibromyalgia    GERD (gastroesophageal reflux disease)    Headache    History of gout    History of kidney stones    Hypertension    IBS (irritable bowel syndrome)    Joint pain    Left ankle pain    Left sciatic nerve pain    Myofascial pain    Neck pain    OSA (obstructive sleep apnea)    cpap   Palpitations    Rheumatoid arthritis (HCC)    Seasonal allergies     Surgeries: Procedure(s): TOTAL KNEE ARTHROPLASTY on 11/24/2021   Consultants:   Discharged Condition: Improved  Hospital Course: Dawn Jordan is an 55 y.o. adult who was admitted 11/24/2021 for operative treatment ofOA (osteoarthritis) of knee. Patient has severe unremitting pain that affects sleep, daily activities, and work/hobbies. After pre-op clearance the patient was taken to the operating room on 11/24/2021 and underwent  Procedure(s): TOTAL KNEE ARTHROPLASTY.    Patient was given perioperative antibiotics:  Anti-infectives (From admission, onward)    Start     Dose/Rate Route Frequency Ordered Stop   11/24/21 1330  ceFAZolin (ANCEF) IVPB 2g/100 mL premix        2 g 200 mL/hr over 30 Minutes Intravenous Every 6 hours 11/24/21 1031 11/24/21 2047   11/24/21 0600  ceFAZolin (ANCEF) IVPB 2g/100 mL premix         2 g 200 mL/hr over 30 Minutes Intravenous On call to O.R. 11/24/21 0558 11/24/21 0830        Patient was given sequential compression devices, early ambulation, and chemoprophylaxis to prevent DVT.  Patient benefited maximally from hospital stay and there were no complications.    Recent vital signs: No data found.   Recent laboratory studies: No results for input(s): WBC, HGB, HCT, PLT, NA, K, CL, CO2, BUN, CREATININE, GLUCOSE, INR, CALCIUM in the last 72 hours.  Invalid input(s): PT, 2   Discharge Medications:   Allergies as of 11/26/2021       Reactions   Eggs Or Egg-derived Products Diarrhea   Latex Anaphylaxis, Hives, Itching   "Anaphylaxis is only when I am in a closed area (ex: car with balloons)"   Other Other (See Comments)   Sinus headache from new plastics, carpets, etc.   Codeine    Headaches         Medication List     STOP taking these medications    nabumetone 500 MG tablet Commonly known as: RELAFEN   OVER THE COUNTER MEDICATION   oxyCODONE 5 MG immediate release tablet Commonly known as: Oxy IR/ROXICODONE       TAKE these  medications    acetaminophen 650 MG CR tablet Commonly known as: TYLENOL Take 1,300 mg by mouth every 8 (eight) hours as needed for pain. Notes to patient: Last dose given 12/20 10:56am   Actemra 162 MG/0.9ML Sosy Generic drug: Tocilizumab Inject 162 mg as directed every Tuesday. Notes to patient: Resume home regimen   atomoxetine 60 MG capsule Commonly known as: STRATTERA Take 1 capsule (60 mg total) by mouth daily. Notes to patient: Resume home regimen   clonazePAM 0.5 MG tablet Commonly known as: KLONOPIN Take 1 tablet (0.5 mg total) by mouth 3 (three) times daily as needed for anxiety. Notes to patient: Resume home regimen   EPINEPHrine 0.3 mg/0.3 mL Soaj injection Commonly known as: EpiPen 2-Pak Use for life threatening allergic reactions Notes to patient: Resume home regimen   flecainide 50 MG  tablet Commonly known as: TAMBOCOR Take 1 tablet (50 mg total) by mouth 2 (two) times daily.   fluticasone 220 MCG/ACT inhaler Commonly known as: Flovent HFA INHALE TWO PUFFS INTO THE LUNGS EVERY MORNING & AT BEDTIME Notes to patient: Resume home regimen   fluticasone 50 MCG/ACT nasal spray Commonly known as: FLONASE PLACE ONE SPRAY IN EACH NOSTRIL EVERY DAY What changed:  how much to take when to take this reasons to take this additional instructions Notes to patient: Resume home regimen   HYDROmorphone 2 MG tablet Commonly known as: DILAUDID Take 1-2 tablets (2-4 mg total) by mouth every 6 (six) hours as needed for moderate pain or severe pain. Notes to patient: Last dose given 12/21 10:20am   levalbuterol 45 MCG/ACT inhaler Commonly known as: XOPENEX HFA INHALE TWO PUFFS INTO THE LUNGS EVERY 6 HOURS AS NEEDED FOR WHEEZING Notes to patient: Resume home regimen   levocetirizine 5 MG tablet Commonly known as: XYZAL Take 1 tablet (5 mg total) by mouth every evening. Notes to patient: Resume home regimen   methocarbamol 500 MG tablet Commonly known as: ROBAXIN Take 1 tablet (500 mg total) by mouth every 6 (six) hours as needed for muscle spasms. Notes to patient: Last dose given 12/21 10:20am   metoprolol succinate 25 MG 24 hr tablet Commonly known as: TOPROL-XL Take 1 tablet (25 mg total) by mouth daily.   montelukast 10 MG tablet Commonly known as: SINGULAIR TAKE 1 TABLET BY MOUTH EVERYDAY AT BEDTIME Notes to patient: Resume home regimen   nitroGLYCERIN 0.4 MG SL tablet Commonly known as: NITROSTAT Place 1 tablet (0.4 mg total) under the tongue every 5 (five) minutes as needed for chest pain. Notes to patient: Resume home regimen   Olopatadine HCl 0.2 % Soln Place 1 drop into both eyes daily as needed. Notes to patient: Resume home regimen   omeprazole 40 MG capsule Commonly known as: PRILOSEC TAKE ONE CAPSULE BY MOUTH EVERY MORNING 30 MINUTES BEFORE  MEALS Notes to patient: Resume home regimen   pregabalin 200 MG capsule Commonly known as: LYRICA Take 200 mg by mouth at bedtime.   REFRESH OP Place 1 drop into both eyes daily. Notes to patient: Resume home regimen   rivaroxaban 10 MG Tabs tablet Commonly known as: XARELTO Take 1 tablet (10 mg total) by mouth daily with breakfast for 20 days. Then take one 81 mg aspirin once a day for three weeks. Then discontinue aspirin.   triamcinolone ointment 0.1 % Commonly known as: KENALOG Apply 1 application topically 2 (two) times daily. What changed:  when to take this reasons to take this Notes to patient: Resume home regimen  Vilazodone HCl 40 MG Tabs Commonly known as: Viibryd Take 1 tablet (40 mg total) by mouth daily. Notes to patient: Resume home regimen               Discharge Care Instructions  (From admission, onward)           Start     Ordered   11/25/21 0000  Weight bearing as tolerated        11/25/21 0808   11/25/21 0000  Change dressing       Comments: You may remove the bulky bandage (ACE wrap and gauze) two days after surgery. You will have an adhesive waterproof bandage underneath. Leave this in place until your first follow-up appointment.   11/25/21 0808            Diagnostic Studies: No results found.  Disposition: Discharge disposition: 01-Home or Self Care       Discharge Instructions     Call MD / Call 911   Complete by: As directed    If you experience chest pain or shortness of breath, CALL 911 and be transported to the hospital emergency room.  If you develope a fever above 101 F, pus (white drainage) or increased drainage or redness at the wound, or calf pain, call your surgeon's office.   Change dressing   Complete by: As directed    You may remove the bulky bandage (ACE wrap and gauze) two days after surgery. You will have an adhesive waterproof bandage underneath. Leave this in place until your first follow-up  appointment.   Constipation Prevention   Complete by: As directed    Drink plenty of fluids.  Prune juice may be helpful.  You may use a stool softener, such as Colace (over the counter) 100 mg twice a day.  Use MiraLax (over the counter) for constipation as needed.   Diet - low sodium heart healthy   Complete by: As directed    Do not put a pillow under the knee. Place it under the heel.   Complete by: As directed    Driving restrictions   Complete by: As directed    No driving for two weeks   Post-operative opioid taper instructions:   Complete by: As directed    POST-OPERATIVE OPIOID TAPER INSTRUCTIONS: It is important to wean off of your opioid medication as soon as possible. If you do not need pain medication after your surgery it is ok to stop day one. Opioids include: Codeine, Hydrocodone(Norco, Vicodin), Oxycodone(Percocet, oxycontin) and hydromorphone amongst others.  Long term and even short term use of opiods can cause: Increased pain response Dependence Constipation Depression Respiratory depression And more.  Withdrawal symptoms can include Flu like symptoms Nausea, vomiting And more Techniques to manage these symptoms Hydrate well Eat regular healthy meals Stay active Use relaxation techniques(deep breathing, meditating, yoga) Do Not substitute Alcohol to help with tapering If you have been on opioids for less than two weeks and do not have pain than it is ok to stop all together.  Plan to wean off of opioids This plan should start within one week post op of your joint replacement. Maintain the same interval or time between taking each dose and first decrease the dose.  Cut the total daily intake of opioids by one tablet each day Next start to increase the time between doses. The last dose that should be eliminated is the evening dose.      TED hose   Complete by: As  directed    Use stockings (TED hose) for three weeks on both leg(s).  You may remove them at  night for sleeping.   Weight bearing as tolerated   Complete by: As directed         Follow-up Information     Gaynelle Arabian, MD. Go on 12/09/2021.   Specialty: Orthopedic Surgery Why: You are scheduled for a follow up appointment on 12-09-20 at 1:45 pm. Contact information: [address] [place] [address] [phone]                  Signed: Theresa Duty 12/02/2021, 12:53 PM

## 2021-12-04 DIAGNOSIS — M25561 Pain in right knee: Secondary | ICD-10-CM | POA: Diagnosis not present

## 2021-12-05 DIAGNOSIS — M25561 Pain in right knee: Secondary | ICD-10-CM | POA: Diagnosis not present

## 2021-12-09 DIAGNOSIS — M25561 Pain in right knee: Secondary | ICD-10-CM | POA: Diagnosis not present

## 2021-12-11 DIAGNOSIS — M25661 Stiffness of right knee, not elsewhere classified: Secondary | ICD-10-CM | POA: Diagnosis not present

## 2021-12-11 DIAGNOSIS — M25561 Pain in right knee: Secondary | ICD-10-CM | POA: Diagnosis not present

## 2021-12-12 DIAGNOSIS — M25561 Pain in right knee: Secondary | ICD-10-CM | POA: Diagnosis not present

## 2021-12-15 DIAGNOSIS — M25561 Pain in right knee: Secondary | ICD-10-CM | POA: Diagnosis not present

## 2021-12-15 DIAGNOSIS — M25661 Stiffness of right knee, not elsewhere classified: Secondary | ICD-10-CM | POA: Diagnosis not present

## 2021-12-17 DIAGNOSIS — M25661 Stiffness of right knee, not elsewhere classified: Secondary | ICD-10-CM | POA: Diagnosis not present

## 2021-12-17 DIAGNOSIS — M25561 Pain in right knee: Secondary | ICD-10-CM | POA: Diagnosis not present

## 2021-12-19 ENCOUNTER — Other Ambulatory Visit (HOSPITAL_COMMUNITY): Payer: Self-pay | Admitting: Psychiatry

## 2021-12-19 DIAGNOSIS — M797 Fibromyalgia: Secondary | ICD-10-CM | POA: Diagnosis not present

## 2021-12-19 DIAGNOSIS — M2550 Pain in unspecified joint: Secondary | ICD-10-CM | POA: Diagnosis not present

## 2021-12-19 DIAGNOSIS — Z6838 Body mass index (BMI) 38.0-38.9, adult: Secondary | ICD-10-CM | POA: Diagnosis not present

## 2021-12-19 DIAGNOSIS — Z79899 Other long term (current) drug therapy: Secondary | ICD-10-CM | POA: Diagnosis not present

## 2021-12-19 DIAGNOSIS — M25561 Pain in right knee: Secondary | ICD-10-CM | POA: Diagnosis not present

## 2021-12-19 DIAGNOSIS — M25661 Stiffness of right knee, not elsewhere classified: Secondary | ICD-10-CM | POA: Diagnosis not present

## 2021-12-19 DIAGNOSIS — R7989 Other specified abnormal findings of blood chemistry: Secondary | ICD-10-CM | POA: Diagnosis not present

## 2021-12-19 DIAGNOSIS — E669 Obesity, unspecified: Secondary | ICD-10-CM | POA: Diagnosis not present

## 2021-12-19 DIAGNOSIS — F4310 Post-traumatic stress disorder, unspecified: Secondary | ICD-10-CM

## 2021-12-19 DIAGNOSIS — M150 Primary generalized (osteo)arthritis: Secondary | ICD-10-CM | POA: Diagnosis not present

## 2021-12-19 DIAGNOSIS — M0609 Rheumatoid arthritis without rheumatoid factor, multiple sites: Secondary | ICD-10-CM | POA: Diagnosis not present

## 2021-12-22 DIAGNOSIS — M25661 Stiffness of right knee, not elsewhere classified: Secondary | ICD-10-CM | POA: Diagnosis not present

## 2021-12-22 DIAGNOSIS — M25561 Pain in right knee: Secondary | ICD-10-CM | POA: Diagnosis not present

## 2021-12-24 ENCOUNTER — Other Ambulatory Visit: Payer: Self-pay | Admitting: Family Medicine

## 2021-12-24 DIAGNOSIS — M25661 Stiffness of right knee, not elsewhere classified: Secondary | ICD-10-CM | POA: Diagnosis not present

## 2021-12-24 DIAGNOSIS — M25561 Pain in right knee: Secondary | ICD-10-CM | POA: Diagnosis not present

## 2021-12-24 DIAGNOSIS — K219 Gastro-esophageal reflux disease without esophagitis: Secondary | ICD-10-CM

## 2021-12-25 ENCOUNTER — Telehealth (HOSPITAL_BASED_OUTPATIENT_CLINIC_OR_DEPARTMENT_OTHER): Payer: Medicare HMO | Admitting: Psychiatry

## 2021-12-25 ENCOUNTER — Other Ambulatory Visit: Payer: Self-pay

## 2021-12-25 DIAGNOSIS — F4310 Post-traumatic stress disorder, unspecified: Secondary | ICD-10-CM | POA: Diagnosis not present

## 2021-12-25 DIAGNOSIS — F331 Major depressive disorder, recurrent, moderate: Secondary | ICD-10-CM

## 2021-12-25 DIAGNOSIS — F900 Attention-deficit hyperactivity disorder, predominantly inattentive type: Secondary | ICD-10-CM

## 2021-12-25 DIAGNOSIS — R632 Polyphagia: Secondary | ICD-10-CM | POA: Diagnosis not present

## 2021-12-25 DIAGNOSIS — R69 Illness, unspecified: Secondary | ICD-10-CM | POA: Diagnosis not present

## 2021-12-25 MED ORDER — ATOMOXETINE HCL 60 MG PO CAPS: 60.0000 mg | ORAL_CAPSULE | Freq: Every day | ORAL | 0 refills | Status: DC

## 2021-12-25 MED ORDER — VILAZODONE HCL 40 MG PO TABS: 40.0000 mg | ORAL_TABLET | Freq: Every day | ORAL | 0 refills | Status: DC

## 2021-12-25 NOTE — Progress Notes (Signed)
Virtual Visit via Video Note  I connected with [name] on 12/25/21 at  2:00 PM EST by a video enabled telemedicine application and verified that I am speaking with the correct person using two identifiers.  Location: Patient: home Provider: office   I discussed the limitations of evaluation and management by telemedicine and the availability of in person appointments. The patient expressed understanding and agreed to proceed.  History of Present Illness: Steffani is recovering from knee replacement surgery in December. Tamika is now in PT and is slowly recovering. Philana stopped taking the Strattera right after the surgery and plans to restart it soon. Marytza's concentration is poor and she is not able to focus enough to complete tasks. Kymia rarely takes her pain meds and been forced to slow down or stop a lot of activities. Suhaila shares the anxiety is a little worse because she can't do a lot physically. She has not been taking the Klonopin since the surgery. Aminta denies any binge eating episodes because of lack of access to vehicles. Darbi is eating 3 meals and is snacking. She lost 10 lbs before the surgery. The PTSD comes and goes. [place] experiences some intrusive memories with triggers. She rarely has flashbacks. The symptoms happen about 3x/week. Due to poor sleep related to pain Georgene is not experiencing any nightmares. The depression is mostly due to knee surgery recovery. Adylin is not able to do much physically. Sebastian denies anhedonia but the motivation to do things is low. Out the last 14 days Shavonna has been depressed about 6 of them. On those days Khaleah feels overwhelmed, tired, sad and unmotivated. Zera denies passive thoughts of death and denies SI/HI.    Observations/Objective: Psychiatric Specialty Exam: ROS  There were no vitals taken for this visit.There is no height or weight on file to calculate BMI.  General Appearance: Casual and Neat  Eye Contact:  Good  Speech:  Clear and Coherent and  Normal Rate  Volume:  Normal  Mood:  Depressed  Affect:  Full Range  Thought Process:  Coherent and Descriptions of Associations: Circumstantial  Orientation:  Full (Time, Place, and Person)  Thought Content:  Rumination  Suicidal Thoughts:  No  Homicidal Thoughts:  No  Memory:  Immediate;   Good  Judgement:  Good  Insight:  Good  Psychomotor Activity:  Normal  Concentration:  Concentration: Good  Recall:  Good  Fund of Knowledge:  Good  Language:  Good  Akathisia:  No  Handed:  Right  AIMS (if indicated):     Assets:  Communication Skills Desire for Improvement Financial Resources/Insurance Housing Intimacy Resilience Social Support Talents/Skills Vocational/Educational  ADL's:  Intact  Cognition:  WNL  Sleep:        Assessment and Plan: Depression screen [facility] 2/9 12/25/2021 10/02/2021 07/29/2021 07/17/2021 04/24/2021  Decreased Interest 0 0 0 1 0  Down, Depressed, Hopeless 1 1 0 3 1  PHQ - 2 Score 1 1 0 4 1  Altered sleeping - - 1 3 -  Tired, decreased energy - - 1 3 -  Change in appetite - - 3 3 -  Feeling bad or failure about yourself  - - 3 3 -  Trouble concentrating - - 3 3 -  Moving slowly or fidgety/restless - - 1 0 -  Suicidal thoughts - - 0 0 -  PHQ-9 Score - - 12 19 -  Difficult doing work/chores - - Somewhat difficult Very difficult -  Some recent data might be hidden  Flowsheet Row Video Visit from 12/25/2021 in [facility] Admission (Discharged) from 11/24/2021 in [facility] Office Visit from 10/02/2021 in [facility] No Risk No Risk No Risk      - restart Strattera 60mg  po qD. She was taking 100mg  prior to December 2022.  - no refills on Klonopin. She has 2 bottles at home    1. Attention deficit hyperactivity disorder (ADHD), predominantly inattentive type - atomoxetine (STRATTERA) 60 MG capsule; Take 1 capsule (60 mg  total) by mouth daily.  Dispense: 90 capsule; Refill: 0  2. PTSD (post-traumatic stress disorder) - Vilazodone HCl (VIIBRYD) 40 MG TABS; Take 1 tablet (40 mg total) by mouth daily.  Dispense: 90 tablet; Refill: 0  3. MDD (major depressive disorder), recurrent episode, moderate (HCC) - Vilazodone HCl (VIIBRYD) 40 MG TABS; Take 1 tablet (40 mg total) by mouth daily.  Dispense: 90 tablet; Refill: 0  4. Binge eating    Follow Up Instructions: In 2-3 months or sooner if needed   I discussed the assessment and treatment plan with the patient. The patient was provided an opportunity to ask questions and all were answered. The patient agreed with the plan and demonstrated an understanding of the instructions.   The patient was advised to call back or seek an in-person evaluation if the symptoms worsen or if the condition fails to improve as anticipated.  I provided 23 minutes of non-face-to-face time during this encounter.   Charlcie Cradle, MD

## 2021-12-26 DIAGNOSIS — M25661 Stiffness of right knee, not elsewhere classified: Secondary | ICD-10-CM | POA: Diagnosis not present

## 2021-12-26 DIAGNOSIS — M25561 Pain in right knee: Secondary | ICD-10-CM | POA: Diagnosis not present

## 2021-12-29 ENCOUNTER — Telehealth: Payer: Self-pay

## 2021-12-29 ENCOUNTER — Ambulatory Visit (INDEPENDENT_AMBULATORY_CARE_PROVIDER_SITE_OTHER): Payer: Medicare HMO

## 2021-12-29 VITALS — Ht 64.0 in | Wt 235.0 lb

## 2021-12-29 DIAGNOSIS — Z0000 Encounter for general adult medical examination without abnormal findings: Secondary | ICD-10-CM | POA: Diagnosis not present

## 2021-12-29 NOTE — Telephone Encounter (Signed)
Contacted patient on preferred number listed in notes for  scheduled AWV. Visit was in progress, patient refused to complete. Encounter nurse note completed.

## 2021-12-29 NOTE — Progress Notes (Signed)
Subjective:   Dawn Jordan is a 56 y.o. female who presents for Medicare Annual/Subsequent preventive examination.  Review of Systems    No ROS      Objective:    There were no vitals filed for this visit. There is no height or weight on file to calculate BMI.  Advanced Directives 11/13/2021 12/26/2020 10/21/2019 10/30/2018 06/04/2017 06/04/2017 12/06/2014  Does Patient Have a Medical Advance Directive? No No No No No No No  Would patient like information on creating a medical advance directive? - No - Patient declined No - Patient declined No - Patient declined Yes (Inpatient - patient defers creating a medical advance directive at this time) - Yes - Educational materials given    Current Medications (verified) Outpatient Encounter Medications as of 12/29/2021  Medication Sig   acetaminophen (TYLENOL) 650 MG CR tablet Take 1,300 mg by mouth every 8 (eight) hours as needed for pain.   ACTEMRA 162 MG/0.9ML SOSY Inject 162 mg as directed every Tuesday.   atomoxetine (STRATTERA) 60 MG capsule Take 1 capsule (60 mg total) by mouth daily.   clonazePAM (KLONOPIN) 0.5 MG tablet Take 1 tablet (0.5 mg total) by mouth 3 (three) times daily as needed for anxiety.   cyclobenzaprine (FLEXERIL) 10 MG tablet Take 10 mg by mouth 3 (three) times daily as needed for muscle spasms.   EPINEPHrine (EPIPEN 2-PAK) 0.3 mg/0.3 mL IJ SOAJ injection Use for life threatening allergic reactions (Patient not taking: Reported on 12/25/2021)   flecainide (TAMBOCOR) 50 MG tablet Take 1 tablet (50 mg total) by mouth 2 (two) times daily.   fluticasone (FLONASE) 50 MCG/ACT nasal spray PLACE ONE SPRAY IN EACH NOSTRIL EVERY DAY (Patient taking differently: 1 spray daily as needed for allergies.)   fluticasone (FLOVENT HFA) 220 MCG/ACT inhaler INHALE TWO PUFFS INTO THE LUNGS EVERY MORNING & AT BEDTIME   HYDROmorphone (DILAUDID) 2 MG tablet Take 1-2 tablets (2-4 mg total) by mouth every 6 (six) hours as needed for moderate pain or  severe pain. (Patient not taking: Reported on 12/25/2021)   levalbuterol (XOPENEX HFA) 45 MCG/ACT inhaler INHALE TWO PUFFS INTO THE LUNGS EVERY 6 HOURS AS NEEDED FOR WHEEZING   levocetirizine (XYZAL) 5 MG tablet Take 1 tablet (5 mg total) by mouth every evening.   methocarbamol (ROBAXIN) 500 MG tablet Take 1 tablet (500 mg total) by mouth every 6 (six) hours as needed for muscle spasms. (Patient not taking: Reported on 12/25/2021)   metoprolol succinate (TOPROL-XL) 25 MG 24 hr tablet Take 1 tablet (25 mg total) by mouth daily.   montelukast (SINGULAIR) 10 MG tablet TAKE 1 TABLET BY MOUTH EVERYDAY AT BEDTIME   nitroGLYCERIN (NITROSTAT) 0.4 MG SL tablet Place 1 tablet (0.4 mg total) under the tongue every 5 (five) minutes as needed for chest pain.   Olopatadine HCl 0.2 % SOLN Place 1 drop into both eyes daily as needed.   omeprazole (PRILOSEC) 40 MG capsule TAKE 1 CAPSULE BY MOUTH EVERY DAY 30 MINUTES BEFORE BREAKFAST   Polyvinyl Alcohol-Povidone (REFRESH OP) Place 1 drop into both eyes daily.   pregabalin (LYRICA) 200 MG capsule Take 200 mg by mouth at bedtime.   triamcinolone ointment (KENALOG) 0.1 % Apply 1 application topically 2 (two) times daily. (Patient taking differently: Apply 1 application topically 2 (two) times daily as needed (eczema).)   Vilazodone HCl (VIIBRYD) 40 MG TABS Take 1 tablet (40 mg total) by mouth daily.   Facility-Administered Encounter Medications as of 12/29/2021  Medication  0.9 %  sodium chloride infusion    Allergies (verified) Eggs or egg-derived products, Latex, Other, and Codeine   History: Past Medical History:  Diagnosis Date   A-fib ([place])    ADD (attention deficit disorder)    Anemia    Anginal pain (HCC)    Anxiety    Asthma    Back pain    Bone spur of foot    Chest pain    Chronic fatigue syndrome    Complication of anesthesia    woke up during sugery once, and also with epidural at [facility] on [address] had episode with epidural    Depression    Dyspnea    Dysrhythmia    AFIB   Fibromyalgia    GERD (gastroesophageal reflux disease)    Headache    History of gout    History of kidney stones    Hypertension    IBS (irritable bowel syndrome)    Joint pain    Left ankle pain    Left sciatic nerve pain    Myofascial pain    Neck pain    OSA (obstructive sleep apnea)    cpap   Palpitations    Rheumatoid arthritis (HCC)    Seasonal allergies    Past Surgical History:  Procedure Laterality Date   BILATERAL KNEE ARTHROSCOPY     CARPAL TUNNEL RELEASE Left    CYST EXCISION     "little cysts taken off both hands and left arm" (06/04/2017)   KNEE ARTHROSCOPY Bilateral    "3 on my left; 2 on my right" (06/04/2017)   LAPAROSCOPIC CHOLECYSTECTOMY     TONSILLECTOMY     TOTAL KNEE ARTHROPLASTY Right 11/24/2021   Procedure: TOTAL KNEE ARTHROPLASTY;  Surgeon: Gaynelle Arabian, MD;  Location: [facility];  Service: Orthopedics;  Laterality: Right;   Family History  Problem Relation Age of Onset   Arthritis Mother    Hyperlipidemia Mother    Hypertension Mother    Stroke Mother    Mental retardation Mother    Diabetes Mother    Allergic rhinitis Mother    Heart disease Mother    Thyroid disease Mother    Depression Mother    Anxiety disorder Mother    Bipolar disorder Mother    Sleep apnea Mother    Eating disorder Mother    Diabetes Maternal Grandfather    Hypertension Maternal Grandfather    Diabetes Paternal Grandmother    Hypertension Paternal Grandmother    Cancer Paternal Grandmother        breast   Allergic rhinitis Brother    Colon cancer Paternal Aunt    Lung cancer Other    Lung cancer Maternal Uncle    Asthma Neg Hx    Eczema Neg Hx    Immunodeficiency Neg Hx    Urticaria Neg Hx    Atopy Neg Hx    Angioedema Neg Hx    Esophageal cancer Neg Hx    Stomach cancer Neg Hx    Rectal cancer Neg Hx    Social History   Socioeconomic History   Marital status: Married    Spouse name: Cheyenne Bordeaux   Number of children: 0   Years of education: Not on file   Highest education level: Not on file  Occupational History   Occupation: not employed-disabled  Tobacco Use   Smoking status: Never   Smokeless tobacco: Never  Vaping Use   Vaping Use: Former  Substance and Sexual Activity  Alcohol use: No   Drug use: No   Sexual activity: Never  Other Topics Concern   Not on file  Social History Narrative   Born in [place], [place]   Unemployed, on disability since 1991 for anxiety,    Education: Biomedical engineer   Single, no children, lives with her friend (used to live with her mother with stroke, now in ALF)      Social Determinants of Health   Financial Resource Strain: Not on file  Food Insecurity: Not on file  Transportation Needs: Not on file  Physical Activity: Not on file  Stress: Not on file  Social Connections: Not on file    Clinical Intake:    Diabetic? No    Activities of Daily Living In your present state of health, do you have any difficulty performing the following activities: 11/24/2021 11/13/2021  Hearing? N N  Vision? N N  Difficulty concentrating or making decisions? Tempie Donning  Walking or climbing stairs? Y Y  Dressing or bathing? N N  Doing errands, shopping? N N  Some recent data might be hidden    Patient Care Team: Martin[place], Betty G, MD as PCP - General (Family Medicine) Jerline Pain, MD as PCP - Cardiology (Cardiology) Gaynelle Arabian, MD as Consulting Physician (Orthopedic Surgery)  Indicate any recent Medical Services you may have received from other than [facility] providers in the past year (date may be approximate).     Assessment:   This is a routine wellness examination for Basia.  Virtual Visit via Telephone Note  I connected with  Ardie Dragoo on 12/29/21 at  1:00 PM EST by telephone and verified that I am speaking with the correct person using two identifiers.  Location: Patient: Home Provider:  Office Persons participating in the virtual visit: patient/Nurse Health Advisor   I discussed the limitations, risks, security and privacy concerns of performing an evaluation and management service by telephone and the availability of in person appointments. The patient expressed understanding and agreed to proceed.  Interactive audio and video telecommunications were attempted between this nurse and patient, however failed, due to patient having technical difficulties OR patient did not have access to video capability.  We continued and completed visit with audio only.  Some vital signs may be absent or patient reported.   Criselda Peaches, LPN   Hearing/Vision screen No results found.  Dietary issues and exercise activities discussed:     Goals Addressed   None    Depression Screen PHQ 2/9 Scores 12/26/2020 12/26/2019 09/18/2019  PHQ - 2 Score 1 6 2   PHQ- 9 Score 1 26 -  Some encounter information is confidential and restricted. Go to Review Flowsheets activity to see all data.    Fall Risk Fall Risk  12/26/2020 09/18/2019 08/31/2019  Falls in the past year? 0 1 1  Number falls in past yr: 0 1 0  Comment - no recent fall -  Injury with Fall? 0 0 1  Risk for fall due to : No Fall Risks History of fall(s);Impaired balance/gait Impaired mobility;Impaired balance/gait  Follow up Falls evaluation completed;Falls prevention discussed - Falls prevention discussed    FALL RISK PREVENTION PERTAINING TO THE HOME:  ASSISTIVE DEVICES UTILIZED TO PREVENT FALLS:  TIMED UP AND GO:  Cognitive Function:     Immunizations Immunization History  Administered Date(s) Administered   Td 12/08/2015   Screening Tests Health Maintenance  Topic Date Due   COVID-19 Vaccine (1) Never done  Zoster Vaccines- Shingrix (1 of 2) Never done   PAP SMEAR-Modifier  Never done   MAMMOGRAM  04/14/2020   INFLUENZA VACCINE  02/05/2029 (Originally 07/07/2021)   TETANUS/TDAP  12/07/2025   COLONOSCOPY  (Pts 45-31yrs Insurance coverage will need to be confirmed)  08/05/2027   Hepatitis C Screening  Completed   HIV Screening  Completed   HPV VACCINES  Aged Out    Health Maintenance  Health Maintenance Due  Topic Date Due   COVID-19 Vaccine (1) Never done   Zoster Vaccines- Shingrix (1 of 2) Never done   PAP SMEAR-Modifier  Never done   MAMMOGRAM  04/14/2020      Additional Screening:   Vision Screening: Recommended annual ophthalmology exams for early detection of glaucoma and other disorders of the eye. Is the patient up to date with their annual eye exam?   Who is the provider or what is the name of the office in which the patient attends annual eye exams?  If pt is not established with a provider, would they like to be referred to a provider to establish care? .   Dental Screening: Recommended annual dental exams for proper oral hygiene  Community Resource Referral / Chronic Care Management: CRR required this visit?    CCM required this visit?       Plan:     I have personally reviewed and noted the following in the patients chart:   Medical and social history Use of alcohol, tobacco or illicit drugs  Current medications and supplements including opioid prescriptions.  Functional ability and status Nutritional status Physical activity Advanced directives List of other physicians Hospitalizations, surgeries, and ER visits in previous 12 months Vitals Screenings to include cognitive, depression, and falls Referrals and appointments  In addition, I have reviewed and discussed with patient certain preventive protocols, quality metrics, and best practice recommendations. A written personalized care plan for preventive services as well as general preventive health recommendations were provided to patient.     Criselda Peaches, LPN   [id number]   Nurse Notes: Visit was in progress Patient refused to complete visit.

## 2021-12-30 DIAGNOSIS — M25561 Pain in right knee: Secondary | ICD-10-CM | POA: Diagnosis not present

## 2021-12-30 DIAGNOSIS — Z4789 Encounter for other orthopedic aftercare: Secondary | ICD-10-CM | POA: Diagnosis not present

## 2021-12-30 DIAGNOSIS — M25661 Stiffness of right knee, not elsewhere classified: Secondary | ICD-10-CM | POA: Diagnosis not present

## 2022-01-02 DIAGNOSIS — M25561 Pain in right knee: Secondary | ICD-10-CM | POA: Diagnosis not present

## 2022-01-02 DIAGNOSIS — M25661 Stiffness of right knee, not elsewhere classified: Secondary | ICD-10-CM | POA: Diagnosis not present

## 2022-01-07 DIAGNOSIS — M25661 Stiffness of right knee, not elsewhere classified: Secondary | ICD-10-CM | POA: Diagnosis not present

## 2022-01-07 DIAGNOSIS — M25561 Pain in right knee: Secondary | ICD-10-CM | POA: Diagnosis not present

## 2022-01-09 DIAGNOSIS — M25661 Stiffness of right knee, not elsewhere classified: Secondary | ICD-10-CM | POA: Diagnosis not present

## 2022-01-09 DIAGNOSIS — M25561 Pain in right knee: Secondary | ICD-10-CM | POA: Diagnosis not present

## 2022-01-12 DIAGNOSIS — M25661 Stiffness of right knee, not elsewhere classified: Secondary | ICD-10-CM | POA: Diagnosis not present

## 2022-01-12 DIAGNOSIS — M25561 Pain in right knee: Secondary | ICD-10-CM | POA: Diagnosis not present

## 2022-01-14 DIAGNOSIS — M25561 Pain in right knee: Secondary | ICD-10-CM | POA: Diagnosis not present

## 2022-01-14 DIAGNOSIS — M25661 Stiffness of right knee, not elsewhere classified: Secondary | ICD-10-CM | POA: Diagnosis not present

## 2022-01-16 DIAGNOSIS — M25661 Stiffness of right knee, not elsewhere classified: Secondary | ICD-10-CM | POA: Diagnosis not present

## 2022-01-20 DIAGNOSIS — M25661 Stiffness of right knee, not elsewhere classified: Secondary | ICD-10-CM | POA: Diagnosis not present

## 2022-01-22 DIAGNOSIS — M25661 Stiffness of right knee, not elsewhere classified: Secondary | ICD-10-CM | POA: Diagnosis not present

## 2022-01-23 DIAGNOSIS — M25661 Stiffness of right knee, not elsewhere classified: Secondary | ICD-10-CM | POA: Diagnosis not present

## 2022-01-28 IMAGING — DX DG RIBS W/ CHEST 3+V*R*
4 series · 4 of 4 positions shown · non-contrast
Comparison: Rib series 02/17/2016.

CLINICAL DATA: Right rib cage and right upper quadrant pain.

EXAM:
RIGHT RIBS AND CHEST - 3+ VIEW

[chest pa]
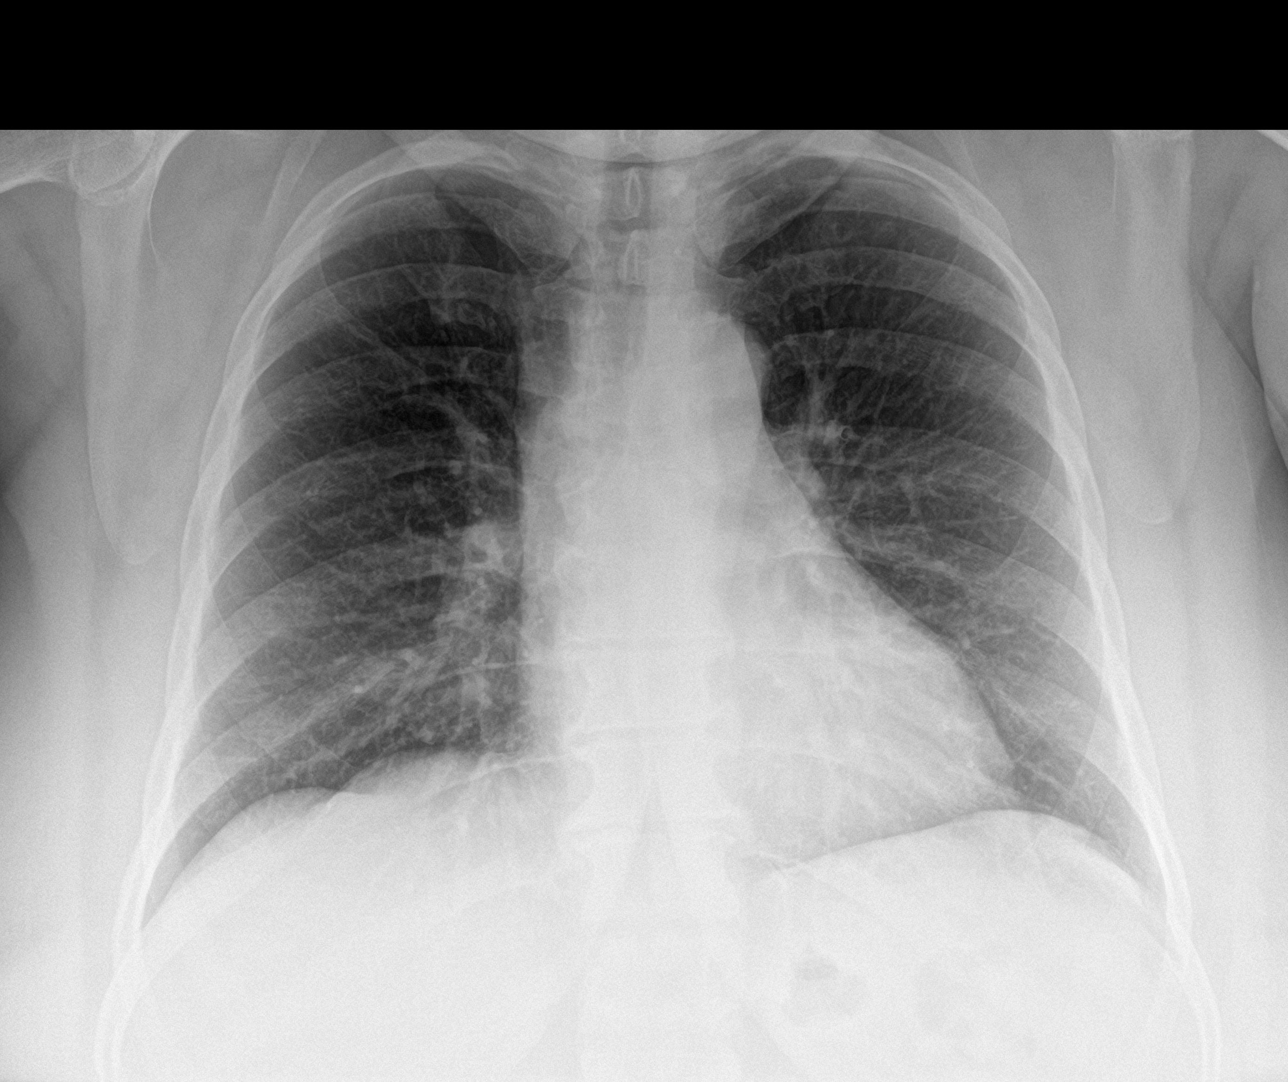

[rib pa (1 of 2)]
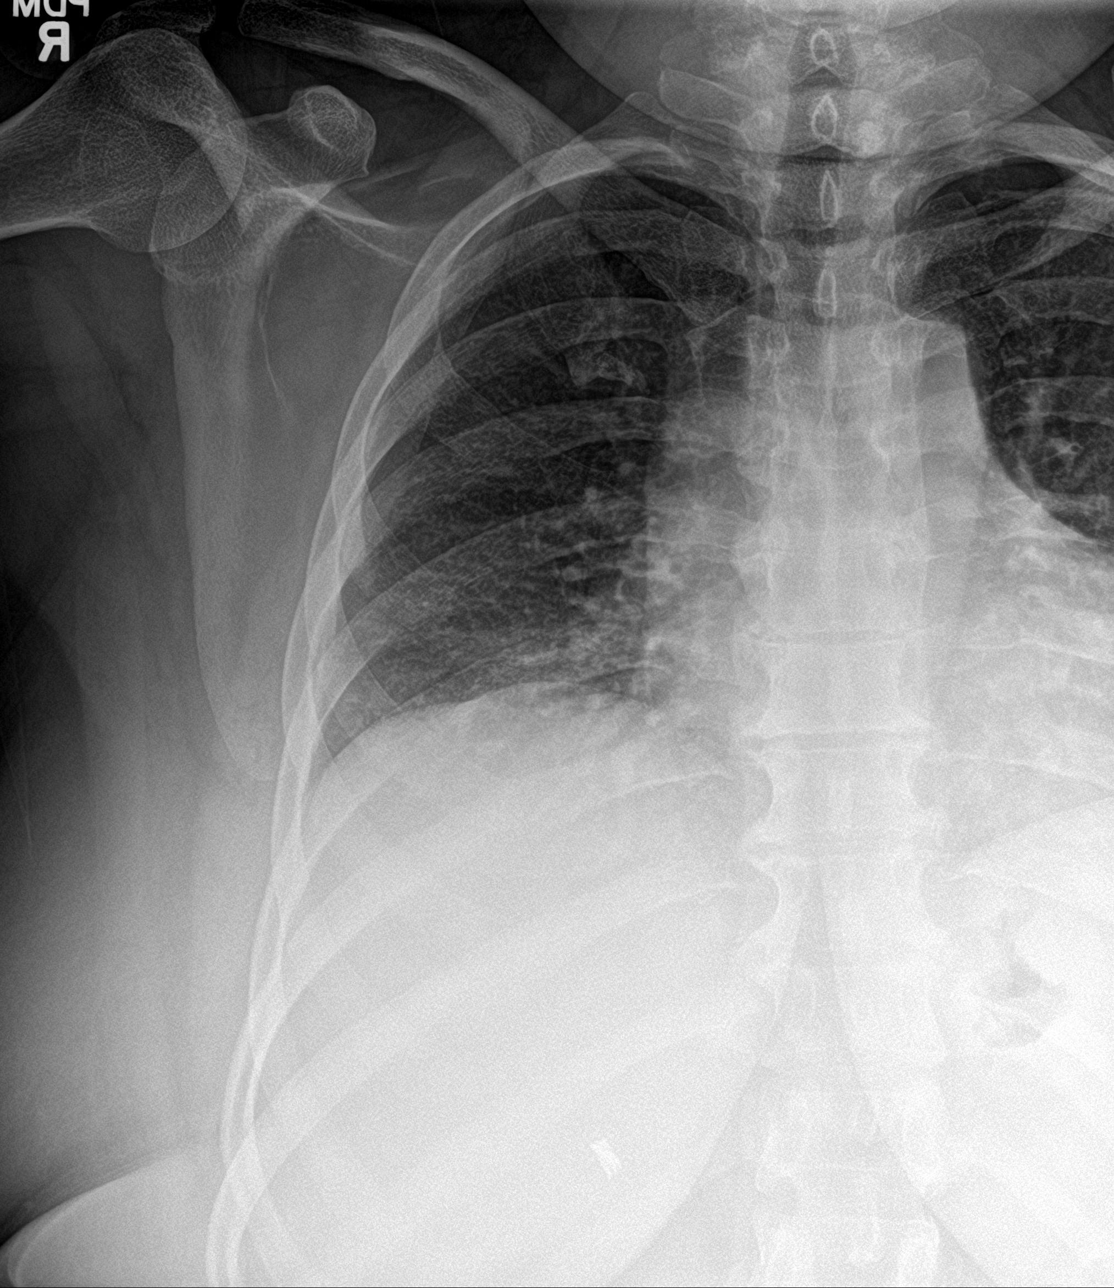

[rib pa (2 of 2)]
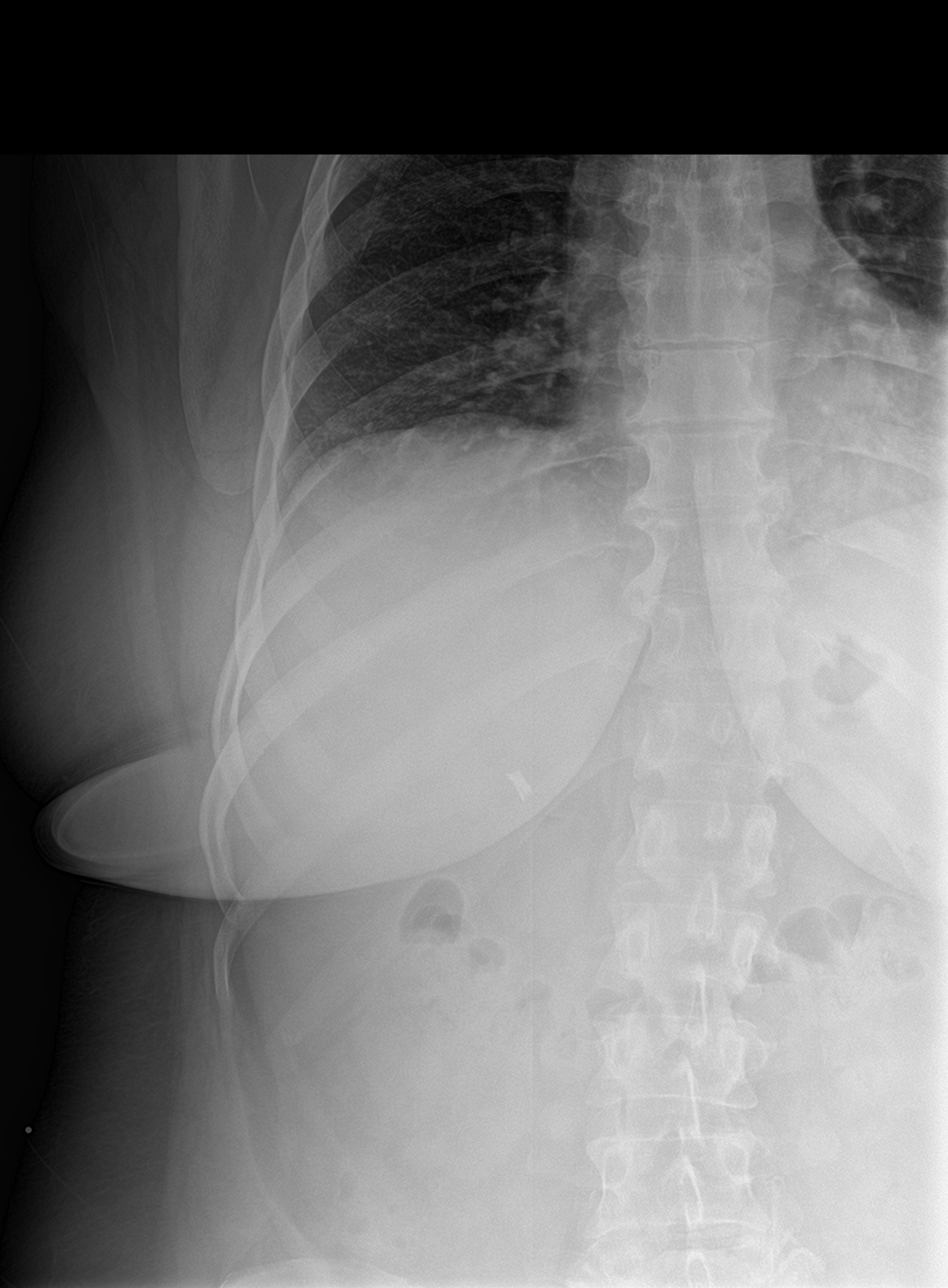

[rib obl]
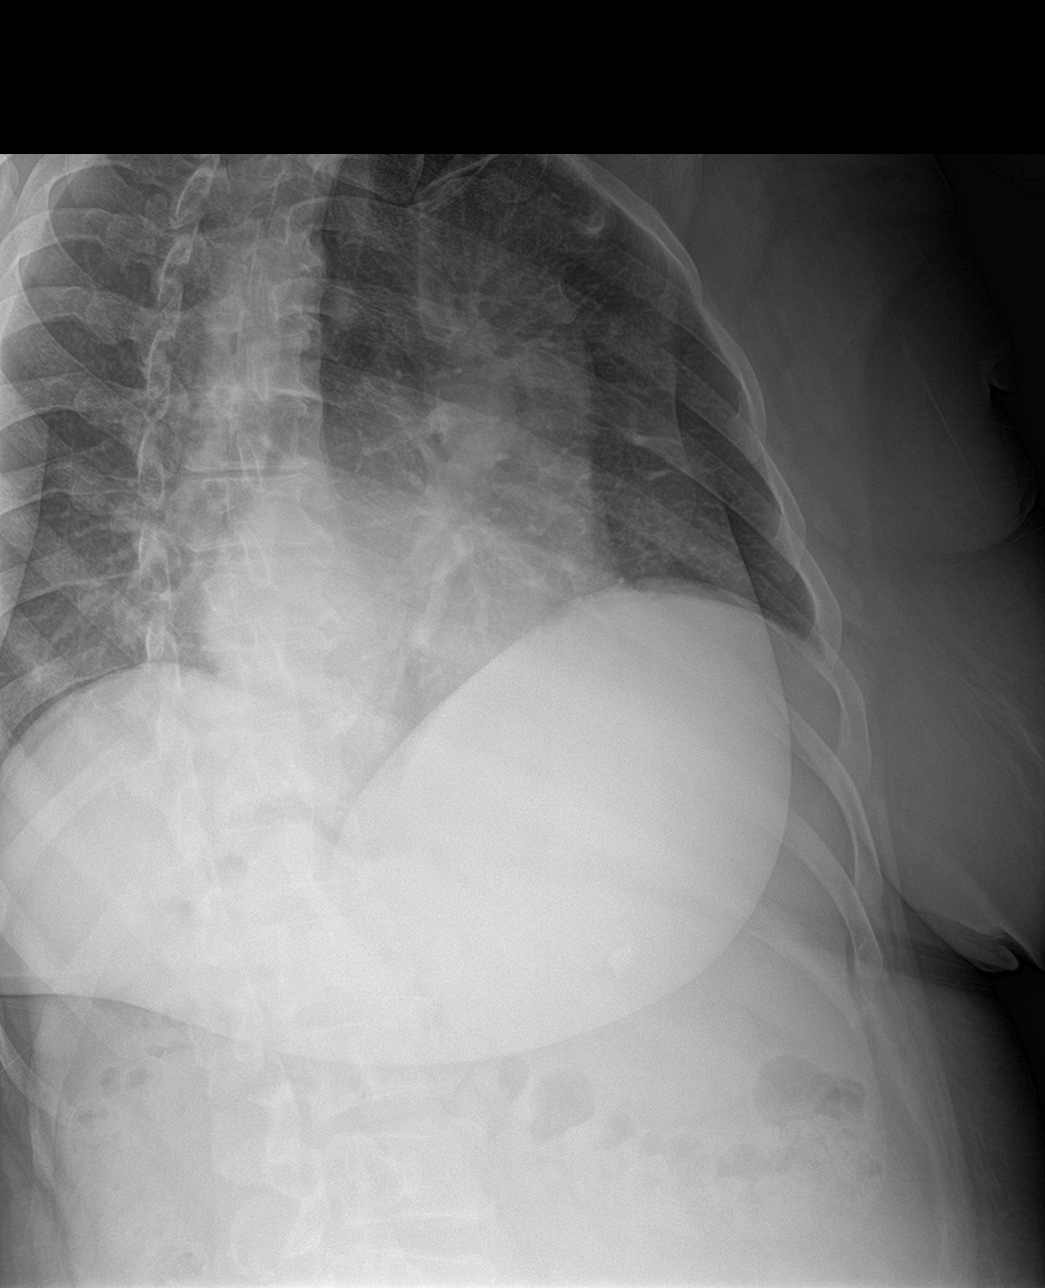

[4 of 4 positions shown; findings below may reference images not displayed]

FINDINGS: Mediastinum hilar structures normal. Heart size stable. Low lung
volumes. Very mild interstitial prominence noted bilaterally. Mild
pneumonitis cannot be excluded. No pleural effusion or pneumothorax.
No acute or focal bony abnormality identified. No evidence of
displaced rib fracture. Surgical clips right upper quadrant.
IMPRESSION: 1. Low lung volumes. Mild bilateral interstitial prominence. Mild
pneumonitis cannot be excluded.

2. No acute or focal bony abnormality. No evidence of displaced rib
fracture or pneumothorax.

## 2022-02-02 DIAGNOSIS — M25661 Stiffness of right knee, not elsewhere classified: Secondary | ICD-10-CM | POA: Diagnosis not present

## 2022-02-06 DIAGNOSIS — M25661 Stiffness of right knee, not elsewhere classified: Secondary | ICD-10-CM | POA: Diagnosis not present

## 2022-02-13 ENCOUNTER — Other Ambulatory Visit: Payer: Self-pay | Admitting: Family Medicine

## 2022-02-13 DIAGNOSIS — M5416 Radiculopathy, lumbar region: Secondary | ICD-10-CM | POA: Diagnosis not present

## 2022-02-15 ENCOUNTER — Emergency Department (HOSPITAL_BASED_OUTPATIENT_CLINIC_OR_DEPARTMENT_OTHER): Payer: Medicare HMO

## 2022-02-15 ENCOUNTER — Other Ambulatory Visit: Payer: Self-pay

## 2022-02-15 ENCOUNTER — Encounter (HOSPITAL_BASED_OUTPATIENT_CLINIC_OR_DEPARTMENT_OTHER): Payer: Self-pay | Admitting: Emergency Medicine

## 2022-02-15 ENCOUNTER — Emergency Department (HOSPITAL_BASED_OUTPATIENT_CLINIC_OR_DEPARTMENT_OTHER)
Admission: EM | Admit: 2022-02-15 | Discharge: 2022-02-15 | Disposition: A | Discharge status: Home / Self Care | Payer: Medicare HMO | Attending: Emergency Medicine | Admitting: Emergency Medicine

## 2022-02-15 DIAGNOSIS — Z87442 Personal history of urinary calculi: Secondary | ICD-10-CM | POA: Insufficient documentation

## 2022-02-15 DIAGNOSIS — R911 Solitary pulmonary nodule: Secondary | ICD-10-CM | POA: Diagnosis not present

## 2022-02-15 DIAGNOSIS — K5730 Diverticulosis of large intestine without perforation or abscess without bleeding: Secondary | ICD-10-CM | POA: Diagnosis not present

## 2022-02-15 DIAGNOSIS — Z91040 Latex allergy status: Secondary | ICD-10-CM | POA: Diagnosis not present

## 2022-02-15 DIAGNOSIS — R1012 Left upper quadrant pain: Secondary | ICD-10-CM | POA: Insufficient documentation

## 2022-02-15 DIAGNOSIS — K8689 Other specified diseases of pancreas: Secondary | ICD-10-CM | POA: Diagnosis not present

## 2022-02-15 DIAGNOSIS — R109 Unspecified abdominal pain: Secondary | ICD-10-CM | POA: Diagnosis not present

## 2022-02-15 DIAGNOSIS — R1032 Left lower quadrant pain: Secondary | ICD-10-CM | POA: Insufficient documentation

## 2022-02-15 LAB — URINALYSIS, ROUTINE W REFLEX MICROSCOPIC
Bilirubin Urine: NEGATIVE
Glucose, UA: NEGATIVE mg/dL
Ketones, ur: NEGATIVE mg/dL
Leukocytes,Ua: NEGATIVE
Nitrite: NEGATIVE
Protein, ur: NEGATIVE mg/dL
Specific Gravity, Urine: 1.023 (ref 1.005–1.030)
pH: 5 (ref 5.0–8.0)

## 2022-02-15 LAB — CBC WITH DIFFERENTIAL/PLATELET
Abs Immature Granulocytes: 0.03 10*3/uL (ref 0.00–0.07)
Basophils Absolute: 0 10*3/uL (ref 0.0–0.1)
Basophils Relative: 1 %
Eosinophils Absolute: 0.2 10*3/uL (ref 0.0–0.5)
Eosinophils Relative: 3 %
HCT: 39.2 % (ref 36.0–46.0)
Hemoglobin: 13.3 g/dL (ref 12.0–15.0)
Immature Granulocytes: 1 %
Lymphocytes Relative: 28 %
Lymphs Abs: 1.4 10*3/uL (ref 0.7–4.0)
MCH: 29 pg (ref 26.0–34.0)
MCHC: 33.9 g/dL (ref 30.0–36.0)
MCV: 85.6 fL (ref 80.0–100.0)
Monocytes Absolute: 0.5 10*3/uL (ref 0.1–1.0)
Monocytes Relative: 9 %
Neutro Abs: 3 10*3/uL (ref 1.7–7.7)
Neutrophils Relative %: 58 %
Platelets: 152 10*3/uL (ref 150–400)
RBC: 4.58 MIL/uL (ref 3.87–5.11)
RDW: 13.2 % (ref 11.5–15.5)
WBC: 5.1 10*3/uL (ref 4.0–10.5)
nRBC: 0 % (ref 0.0–0.2)

## 2022-02-15 LAB — COMPREHENSIVE METABOLIC PANEL
ALT: 43 U/L (ref 0–44)
AST: 25 U/L (ref 15–41)
Albumin: 4.4 g/dL (ref 3.5–5.0)
Alkaline Phosphatase: 66 U/L (ref 38–126)
Anion gap: 7 (ref 5–15)
BUN: 16 mg/dL (ref 6–20)
CO2: 25 mmol/L (ref 22–32)
Calcium: 9.4 mg/dL (ref 8.9–10.3)
Chloride: 108 mmol/L (ref 98–111)
Creatinine, Ser: 0.81 mg/dL (ref 0.44–1.00)
GFR, Estimated: >60 mL/min (ref >60)
Glucose, Bld: 102 mg/dL — ABNORMAL HIGH (ref 70–99)
Potassium: 4.3 mmol/L (ref 3.5–5.1)
Sodium: 140 mmol/L (ref 135–145)
Total Bilirubin: 0.7 mg/dL (ref 0.3–1.2)
Total Protein: 6.5 g/dL (ref 6.5–8.1)

## 2022-02-15 LAB — LIPASE, BLOOD: Lipase: 19 U/L (ref 11–51)

## 2022-02-15 LAB — D-DIMER, QUANTITATIVE: D-Dimer, Quant: 0.31 ug/mL-FEU (ref 0.00–0.50)

## 2022-02-15 MED ORDER — METHOCARBAMOL 1000 MG/10ML IJ SOLN
500.0000 mg | Freq: Once | INTRAMUSCULAR | Status: AC
  Administered 2022-02-15: 500 mg via INTRAVENOUS
  Filled 2022-02-15: qty 10

## 2022-02-15 MED ORDER — CEPHALEXIN 500 MG PO CAPS: 1000.0000 mg | ORAL_CAPSULE | Freq: Two times a day (BID) | ORAL | 0 refills | Status: DC

## 2022-02-15 MED ORDER — METHOCARBAMOL 500 MG PO TABS: 500.0000 mg | ORAL_TABLET | Freq: Four times a day (QID) | ORAL | 0 refills | Status: DC | PRN

## 2022-02-15 MED ORDER — CEPHALEXIN 250 MG PO CAPS
1000.0000 mg | ORAL_CAPSULE | Freq: Once | ORAL | Status: AC
  Administered 2022-02-15: 1000 mg via ORAL
  Filled 2022-02-15: qty 4

## 2022-02-15 MED ORDER — HYDROMORPHONE HCL 1 MG/ML IJ SOLN
1.0000 mg | Freq: Once | INTRAMUSCULAR | Status: AC
  Administered 2022-02-15: 1 mg via INTRAVENOUS
  Filled 2022-02-15: qty 1

## 2022-02-15 MED ORDER — DIPHENHYDRAMINE HCL 50 MG/ML IJ SOLN
25.0000 mg | Freq: Once | INTRAMUSCULAR | Status: AC
Start: 2022-02-15 — End: 2022-02-15
  Administered 2022-02-15: 25 mg via INTRAVENOUS
  Filled 2022-02-15: qty 1

## 2022-02-15 MED ORDER — OXYCODONE-ACETAMINOPHEN 5-325 MG PO TABS: 1.0000 | ORAL_TABLET | Freq: Four times a day (QID) | ORAL | 0 refills | Status: DC | PRN

## 2022-02-15 MED ORDER — IOHEXOL 350 MG/ML SOLN
100.0000 mL | Freq: Once | INTRAVENOUS | Status: AC | PRN
  Administered 2022-02-15: 100 mL via INTRAVENOUS

## 2022-02-15 NOTE — Discharge Instructions (Signed)
1.  Your urinalysis has some white blood cells and red blood cells in it.  This may be suggestive of infection.  A urine culture has been added.  At this time because you are having a lot of flank pain, you are being treated with an antibiotic called Keflex.  Have your doctor follow-up on your urine culture to determine if there is any sign of urine infection.  Return to the emergency department to get fevers chills vomiting or other concerning symptoms. ?2.  Your CT scans did not show an abnormality in the area of your pain.  At this time your pain may be related to musculoskeletal pain or possibly early kidney infection that is not being seen on CT scan. ?3.  Treat your pain with your usual pain medications.  You have been given a small course of Percocet to use if needed for pain over the next couple of days.  You have also been given Robaxin as a muscle relaxer. ?

## 2022-02-15 NOTE — ED Provider Notes (Signed)
[facility] EMERGENCY DEPT Provider Note   CSN: [id number] Arrival date & time: 02/15/22  1328     History  Chief Complaint  Patient presents with   Flank Pain    Dawn Jordan is a 56 y.o. adult.  HPI Patient flank pain since yesterday.  At first it was a sharp pain with some aching quality.  Thought it was a back strain.  She has been trying Tylenol and Goody powders.  Has not been getting much relief.  She reports that the pain is gotten more persistent and severe and she is getting some radiation around to the left anterior abdomen.  No vomiting.  No diarrhea.  No constipation.  No pain burning urgency urination.  No blood in the urine.  Patient does history of kidney stones.  She reports kidney stones and was stable for a long time.  It is worse with certain twisting movements.  At times she can find a comfortable position.  No radiation into the buttocks or legs.  No weakness numbness or tingling of the lower extremities.    Home Medications Prior to Admission medications   Medication Sig Start Date End Date Taking? Authorizing Provider  cephALEXin (KEFLEX) 500 MG capsule Take 2 capsules (1,000 mg total) by mouth 2 (two) times daily. 02/15/22  Yes Charlesetta Shanks, MD  methocarbamol (ROBAXIN) 500 MG tablet Take 1 tablet (500 mg total) by mouth every 6 (six) hours as needed for muscle spasms. 02/15/22  Yes Charlesetta Shanks, MD  oxyCODONE-acetaminophen (PERCOCET) 5-325 MG tablet Take 1-2 tablets by mouth every 6 (six) hours as needed. 02/15/22  Yes Charlesetta Shanks, MD  acetaminophen (TYLENOL) 650 MG CR tablet Take 1,300 mg by mouth every 8 (eight) hours as needed for pain.    [provider]  ACTEMRA 162 MG/0.9ML SOSY Inject 162 mg as directed every Tuesday. 10/17/18   [provider]  atomoxetine (STRATTERA) 60 MG capsule Take 1 capsule (60 mg total) by mouth daily. 12/25/21   Charlcie Cradle, MD  clonazePAM (KLONOPIN) 0.5 MG tablet Take 1 tablet (0.5 mg  total) by mouth 3 (three) times daily as needed for anxiety. 10/02/21   Charlcie Cradle, MD  cyclobenzaprine (FLEXERIL) 10 MG tablet Take 10 mg by mouth 3 (three) times daily as needed for muscle spasms.    [provider]  EPINEPHRINE 0.3 mg/0.3 mL IJ SOAJ injection Use for life threatening allergic reactions 02/13/22   Ambs, Kathrine Cords, FNP  flecainide (TAMBOCOR) 50 MG tablet Take 1 tablet (50 mg total) by mouth 2 (two) times daily. 09/04/21   Jerline Pain, MD  fluticasone (FLONASE) 50 MCG/ACT nasal spray PLACE ONE SPRAY IN EACH NOSTRIL EVERY DAY Patient taking differently: 1 spray daily as needed for allergies. 06/26/21   Dara Hoyer, FNP  fluticasone (FLOVENT HFA) 220 MCG/ACT inhaler INHALE TWO PUFFS INTO THE LUNGS EVERY MORNING & AT BEDTIME 11/17/21   Ambs, Kathrine Cords, FNP  HYDROmorphone (DILAUDID) 2 MG tablet Take 1-2 tablets (2-4 mg total) by mouth every 6 (six) hours as needed for moderate pain or severe pain. Patient not taking: Reported on 12/25/2021 11/25/21   Derl Barrow, PA  levalbuterol [facility] HFA) 45 MCG/ACT inhaler INHALE TWO PUFFS INTO THE LUNGS EVERY 6 HOURS AS NEEDED FOR WHEEZING 11/13/21   Garnet Sierras, DO  levocetirizine (XYZAL) 5 MG tablet Take 1 tablet (5 mg total) by mouth every evening. 11/13/21   Garnet Sierras, DO  methocarbamol (ROBAXIN) 500 MG tablet Take 1  tablet (500 mg total) by mouth every 6 (six) hours as needed for muscle spasms. Patient not taking: Reported on 12/25/2021 11/25/21   Theresa Duty L, PA  metoprolol succinate (TOPROL-XL) 25 MG 24 hr tablet Take 1 tablet (25 mg total) by mouth daily. 09/04/21   Jerline Pain, MD  montelukast (SINGULAIR) 10 MG tablet TAKE 1 TABLET BY MOUTH EVERYDAY AT BEDTIME 11/21/21   Ambs, Kathrine Cords, FNP  nitroGLYCERIN (NITROSTAT) 0.4 MG SL tablet Place 1 tablet (0.4 mg total) under the tongue every 5 (five) minutes as needed for chest pain. 01/24/20   Jerline Pain, MD  Olopatadine HCl 0.2 % SOLN Place 1 drop into both eyes  daily as needed. 11/13/21   Garnet Sierras, DO  omeprazole (PRILOSEC) 40 MG capsule TAKE 1 CAPSULE BY MOUTH EVERY DAY 30 MINUTES BEFORE BREAKFAST 12/24/21   Martinique, Betty G, MD  Polyvinyl Alcohol-Povidone (REFRESH OP) Place 1 drop into both eyes daily.    [provider]  pregabalin (LYRICA) 200 MG capsule Take 200 mg by mouth at bedtime.    [provider]  triamcinolone ointment (KENALOG) 0.1 % Apply 1 application topically 2 (two) times daily. Patient taking differently: Apply 1 application topically 2 (two) times daily as needed (eczema). 03/29/20   Dara Hoyer, FNP  Vilazodone HCl (VIIBRYD) 40 MG TABS Take 1 tablet (40 mg total) by mouth daily. 12/25/21   Charlcie Cradle, MD      Allergies    Eggs or egg-derived products, Latex, Other, and Codeine    Review of Systems   Review of Systems 10 Systems reviewed negative except as per HPI Physical Exam Updated Vital Signs BP 118/68 (BP Location: Right Arm)    Pulse 80    Temp 98.4 F (36.9 C) (Oral)    Resp 18    Ht '5\' 5"'$  (1.651 m)    Wt 111.1 kg    LMP  (LMP Unknown)    SpO2 99%    BMI 40.77 kg/m  Physical Exam Constitutional:      Comments: Alert nontoxic mental status clear.  No respiratory distress.  HENT:     Mouth/Throat:     Pharynx: Oropharynx is clear.  Eyes:     Extraocular Movements: Extraocular movements intact.  Cardiovascular:     Rate and Rhythm: Normal rate and regular rhythm.  Pulmonary:     Effort: Pulmonary effort is normal.     Breath sounds: Normal breath sounds.  Abdominal:     Palpations: Abdomen is soft.     Comments: Discomfort to high left lateral and upper quadrant.  No guarding.  No rash or mass or soft tissue changes  Musculoskeletal:     Comments: Patient has some reproducible pain to palpation along the left paraspinous muscle bodies in the thoracic and upper lumbar area and wrapping around laterally toward the flank.  No rashes.  Skin:    General: Skin is warm and dry.  Neurological:      General: No focal deficit present.     Mental Status: [place] Vent[place]lla is oriented to person, place, and time.     Motor: No weakness.     Coordination: Coordination normal.  Psychiatric:        Mood and Affect: Mood normal.    ED Results / Procedures / Treatments   Labs (all labs ordered are listed, but only abnormal results are displayed) Labs Reviewed  URINALYSIS, ROUTINE W REFLEX MICROSCOPIC - Abnormal; Notable for the following components:  Result Value   Hgb urine dipstick SMALL (*)    Bacteria, UA RARE (*)    All other components within normal limits  COMPREHENSIVE METABOLIC PANEL - Abnormal; Notable for the following components:   Glucose, Bld 102 (*)    All other components within normal limits  URINE CULTURE  LIPASE, BLOOD  CBC WITH DIFFERENTIAL/PLATELET  D-DIMER, QUANTITATIVE    EKG None  Radiology CT Renal Stone Study  Result Date: 02/15/2022 CLINICAL DATA:  56 year old female with acute LEFT flank, abdominal and pelvic pain for 1 day. EXAM: CT ABDOMEN AND PELVIS WITHOUT CONTRAST TECHNIQUE: Multidetector CT imaging of the abdomen and pelvis was performed following the standard protocol without IV contrast. RADIATION DOSE REDUCTION: This exam was performed according to the departmental dose-optimization program which includes automated exposure control, adjustment of the mA and/or kV according to patient size and/or use of iterative reconstruction technique. COMPARISON:  04/24/2020 ultrasound FINDINGS: Please note that parenchymal and vascular abnormalities may be missed as intravenous contrast was not administered. Lower chest: A 6 mm RIGHT LOWER lobe nodule (series 4: Image 5) and a 4 mm LEFT LOWER lobe ground-glass nodule is present (4:14). No other significant abnormalities. Hepatobiliary: The liver is unremarkable. The patient is status post cholecystectomy. There is no evidence of intrahepatic or extrahepatic biliary dilatation. Pancreas: Unremarkable Spleen:  Unremarkable Adrenals/Urinary Tract: The kidneys, adrenal glands and bladder are unremarkable. No hydronephrosis or obstructing urinary calculi. Stomach/Bowel: Stomach is within normal limits. Appendix appears normal. No evidence of bowel wall thickening, distention, or inflammatory changes. Vascular/Lymphatic: Aortic atherosclerosis. No enlarged abdominal or pelvic lymph nodes. Reproductive: Uterus and bilateral adnexa are unremarkable. Other: No ascites, focal collection or pneumoperitoneum. Musculoskeletal: No acute or suspicious bony abnormalities are noted. IMPRESSION: 1. No acute abnormality. 2. 6 mm RIGHT LOWER lobe nodule and 4 mm LEFT LOWER lobe ground-glass nodule. Non-contrast chest CT at 3-6 months is recommended. If nodules persist, subsequent management will be based upon the most suspicious nodule(s). This recommendation follows the consensus statement: Guidelines for Management of Incidental Pulmonary Nodules Detected on CT Images: From the Fleischner Society 2017; Radiology 2017; 284:228-243. 3. Aortic Atherosclerosis (ICD10-I70.0). Electronically Signed   By: Margarette Canada M.D.   On: 02/15/2022 16:17   CT Angio Abd/Pel W and/or Wo Contrast  Result Date: 02/15/2022 CLINICAL DATA:  Aneurysm EXAM: CTA ABDOMEN AND PELVIS WITHOUT AND WITH CONTRAST TECHNIQUE: Multidetector CT imaging of the abdomen and pelvis was performed using the standard protocol during bolus administration of intravenous contrast. Multiplanar reconstructed images and MIPs were obtained and reviewed to evaluate the vascular anatomy. RADIATION DOSE REDUCTION: This exam was performed according to the departmental dose-optimization program which includes automated exposure control, adjustment of the mA and/or kV according to patient size and/or use of iterative reconstruction technique. CONTRAST:  179m OMNIPAQUE IOHEXOL 350 MG/ML SOLN IV COMPARISON:  Noncontrast CT abdomen and pelvis 02/15/2022 FINDINGS: VASCULAR Aorta: Normal  caliber without aneurysm or dissection. No plaque formation. Celiac: Normal caliber, widely patent, no plaque SMA: Normal caliber, widely patent, no plaque Renals: BILATERAL renal arteries normal caliber, widely patent, no plaque IMA: Normal caliber, widely patent Inflow: Normal caliber, widely patent, no plaque Proximal Outflow: Normal caliber, widely patent, no plaque Veins: Patent Review of the MIP images confirms the above findings. NON-VASCULAR Lower chest: Basilar lung nodules as noted on prior noncontrast CT, please refer to preceding exam. Hepatobiliary: Gallbladder surgically absent.  Liver unremarkable. Pancreas: Atrophic without mass Spleen: Normal appearance Adrenals/Urinary Tract: Adrenal glands, kidneys, ureters, and  bladder normal appearance Stomach/Bowel: Distal colonic diverticulosis without evidence of diverticulitis. Stomach and remaining bowel loops unremarkable. Normal appendix. Lymphatic: No adenopathy. Reproductive: Unremarkable uterus and ovaries Other: No free air or free fluid. No hernia or inflammatory process. Musculoskeletal: Unremarkable IMPRESSION: No evidence of abdominal aortic aneurysm or dissection. Distal colonic diverticulosis without evidence of diverticulitis. No acute intra-abdominal or intrapelvic process. Multiple pulmonary nodules. Please refer to preceding noncontrast CT abdomen and pelvis of 02/15/2022 for follow-up recommendations. Electronically Signed   By: Lavonia Dana M.D.   On: 02/15/2022 21:05    Procedures Procedures    Medications Ordered in ED Medications  cephALEXin (KEFLEX) capsule 1,000 mg (has no administration in time range)  HYDROmorphone (DILAUDID) injection 1 mg (1 mg Intravenous Given 02/15/22 1611)  diphenhydrAMINE (BENADRYL) injection 25 mg (25 mg Intravenous Given 02/15/22 1610)  methocarbamol (ROBAXIN) injection 500 mg (500 mg Intravenous Given 02/15/22 1804)  HYDROmorphone (DILAUDID) injection 1 mg (1 mg Intravenous Given 02/15/22 1747)   iohexol (OMNIPAQUE) 350 MG/ML injection 100 mL (100 mLs Intravenous Contrast Given 02/15/22 2004)    ED Course/ Medical Decision Making/ A&P                           Medical Decision Making Amount and/or Complexity of Data Reviewed Labs: ordered. Radiology: ordered.  Risk Prescription drug management.   Patient presents expressing significant mount of pain in the flank and left lower quadrant.  She is very uncomfortable in appearance on arrival.  She endorses a lot of pain to palpation both in the flank but also in the left upper quadrant.  At this time differential diagnosis includes musculoskeletal pain, kidney stone, vascular emergency GI abnormality such as pancreatitis.  Lab work initiated for abdominal and flank pain.  CT scan ordered to rule out stone.  After review of CT study I have personally reviewed images, no stones or acute findings identified by radiology review.  Urinalysis is trace positive with leukocytes and red cells.  Patient is not endorsing dysuria.  She is afebrile.  CT scan does not show pyelonephritis.  Patient was treated for pain with Dilaudid and Benadryl with mild improvement.  She remained tender to palpation in the left upper quadrant.  She also remained uncomfortable in appearance at this time I had concern for possible vascular condition such as adrenal or renal infarct, or GI arterial obstruction.  Proceeded with CT angiogram.  CT angiogram without acute findings.  Patient was reassessed.  She has had some improvement in pain with an additional dose of Dilaudid.  Although pain may be musculoskeletal, with significant reproducible quality in the left anterior and flank with persisting pain even at rest, will treat empirically for possible early pyelonephritis although CT is not showing inflammatory change.  Keflex given orally.  We will also continue with muscle relaxers and short course of narcotic pain control.  Patient gets limited scripts of oxycodone IR  consistent with as described.  At this time plan will be for pain control and empiric treatment of UTI with culture pending.  Careful return precautions are reviewed.  I recommend patient follow-up with PCP for recheck within the next 3 to 5 days.  She is stable at time of discharge and pain is improved although it does persist.  She feels comfortable with discharge home and plan.        Final Clinical Impression(s) / ED Diagnoses Final diagnoses:  Flank pain    Rx / DC  Orders ED Discharge Orders          Ordered    methocarbamol (ROBAXIN) 500 MG tablet  Every 6 hours PRN        02/15/22 2117    oxyCODONE-acetaminophen (PERCOCET) 5-325 MG tablet  Every 6 hours PRN        02/15/22 2117    cephALEXin (KEFLEX) 500 MG capsule  2 times daily        02/15/22 2117              Charlesetta Shanks, MD 02/15/22 2127

## 2022-02-15 NOTE — ED Triage Notes (Signed)
Pt c/o left low back pain that radiates to left flank area onset yesterday. Pt denies injury, Nausea, vomiting or urinary symptoms. ?

## 2022-02-17 LAB — URINE CULTURE: Culture: 10000 — AB

## 2022-02-18 IMAGING — US US ABDOMEN LIMITED
1 series · 14 of 25 positions shown · non-contrast
Comparison: None.

CLINICAL DATA: Elevated transaminase levels and tenderness right
upper quadrant. Previous cholecystectomy.

EXAM:
ULTRASOUND ABDOMEN LIMITED RIGHT UPPER QUADRANT

[Series 1: us abdomen limited · 0.28mm/px · 14 of 42 slices shown]
[im 1/42]
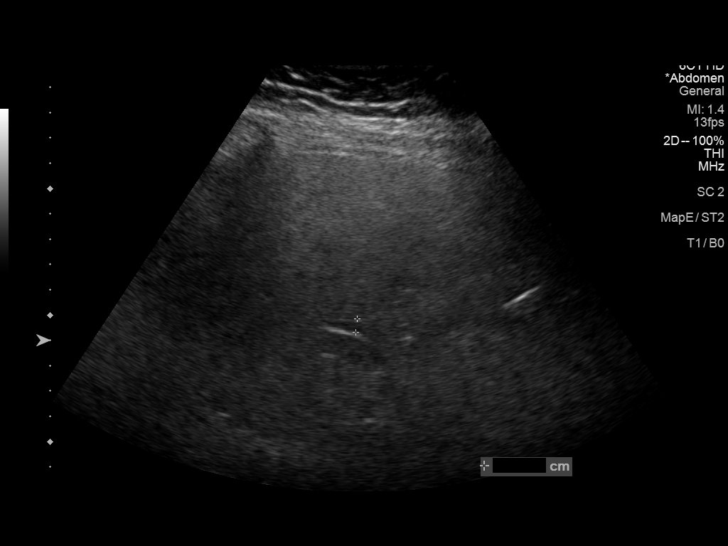
[im 4/42]
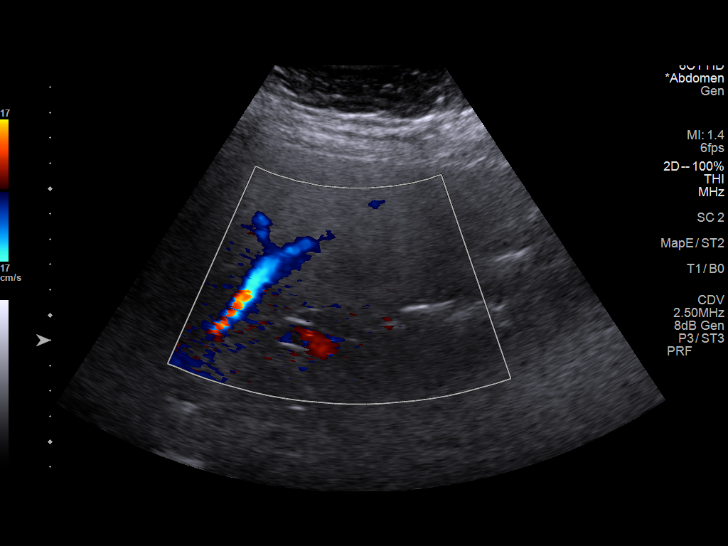
[im 7/42]
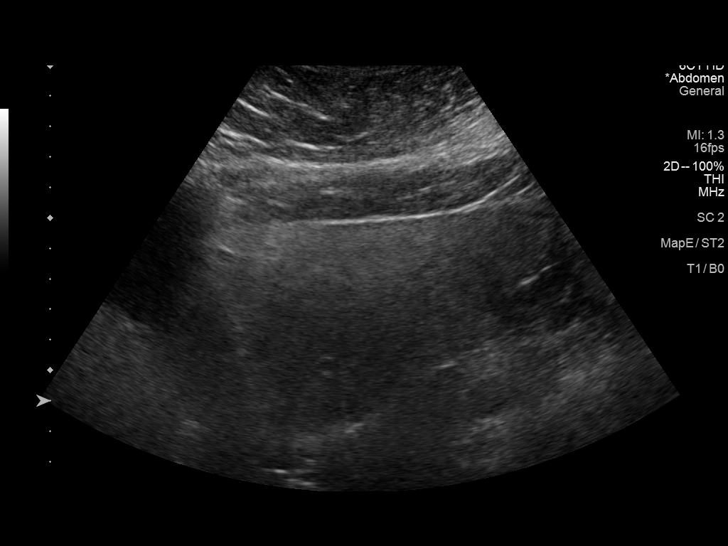
[im 11/42]
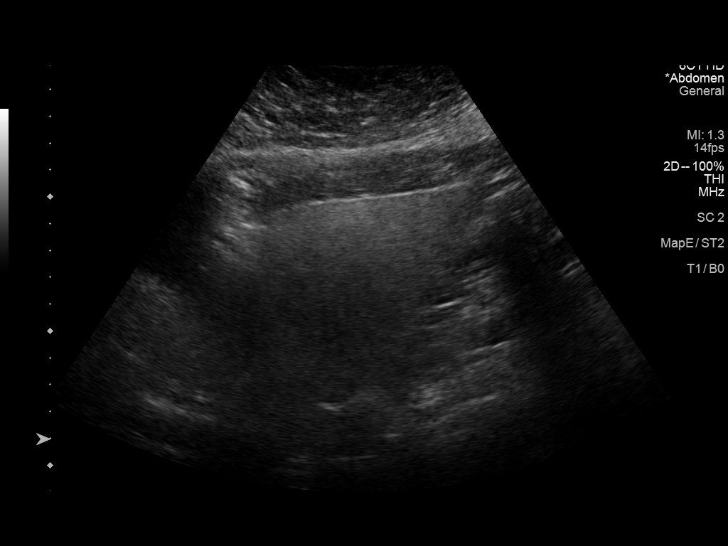
[im 14/42]
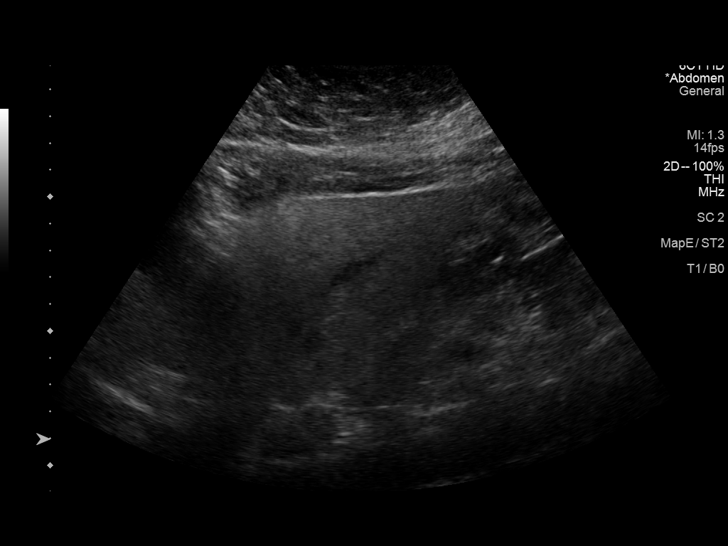
[im 16/42]
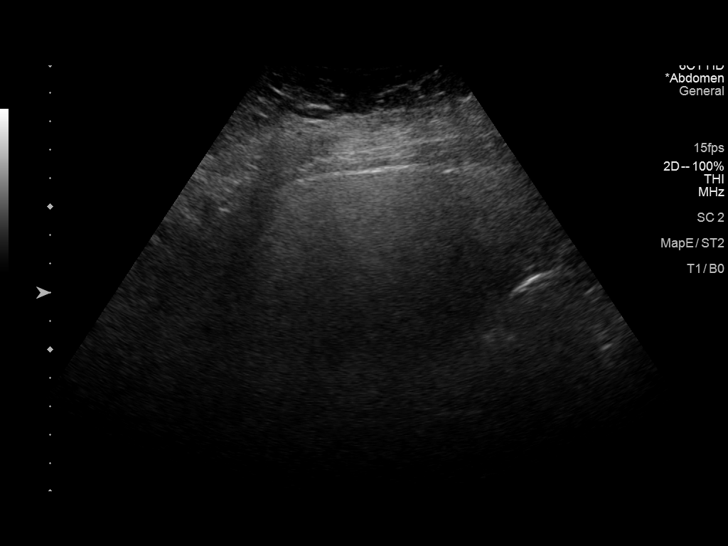
[im 19/42]
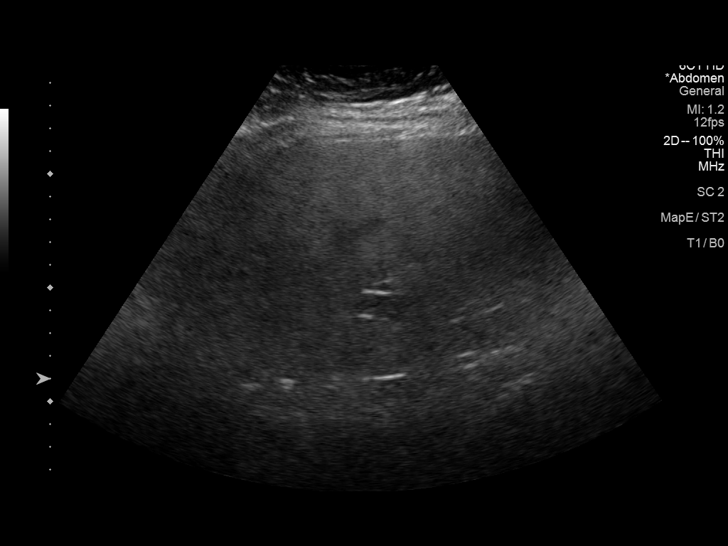
[im 23/42]
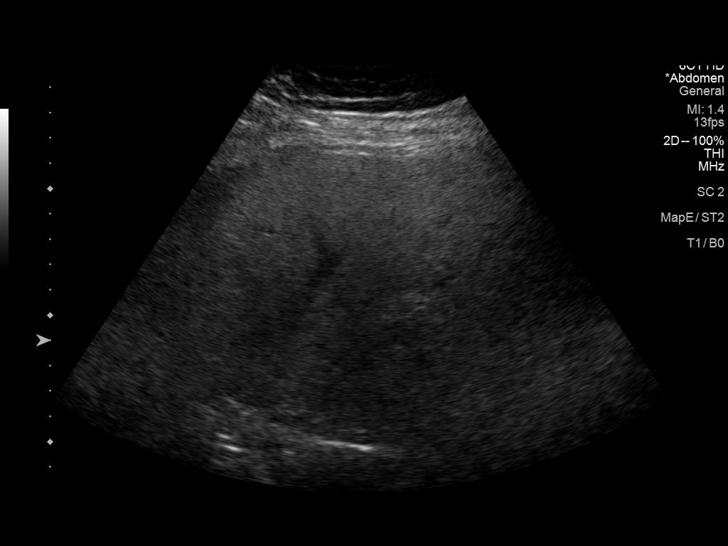
[im 26/42]
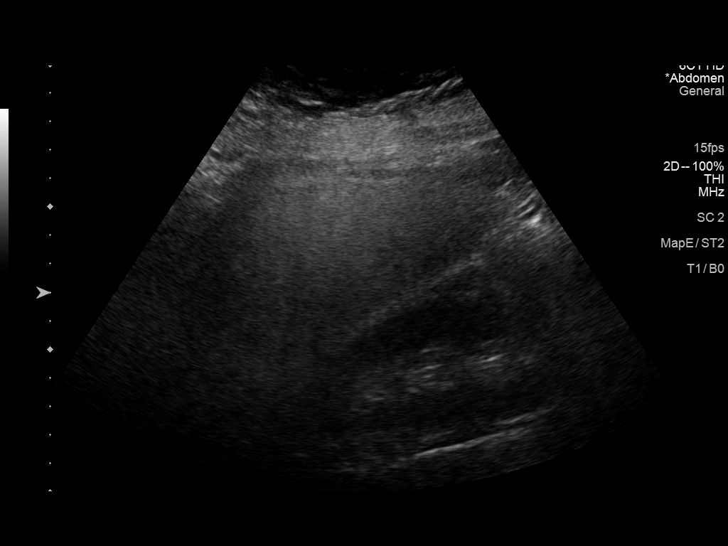
[im 28/42]
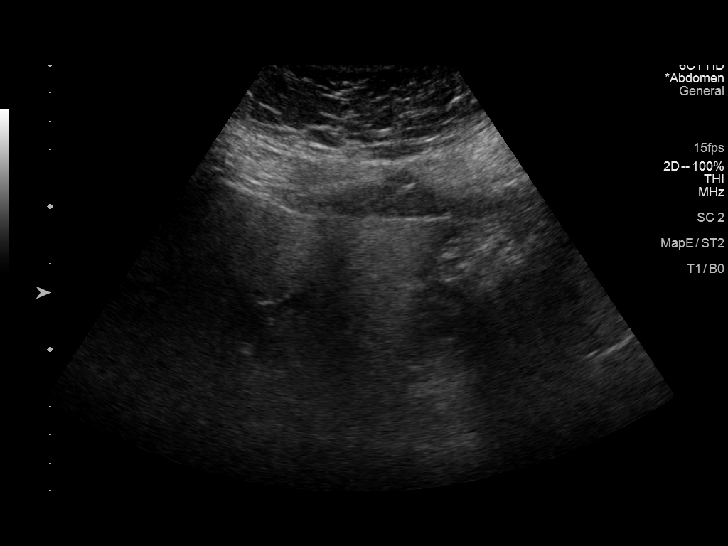
[im 31/42]
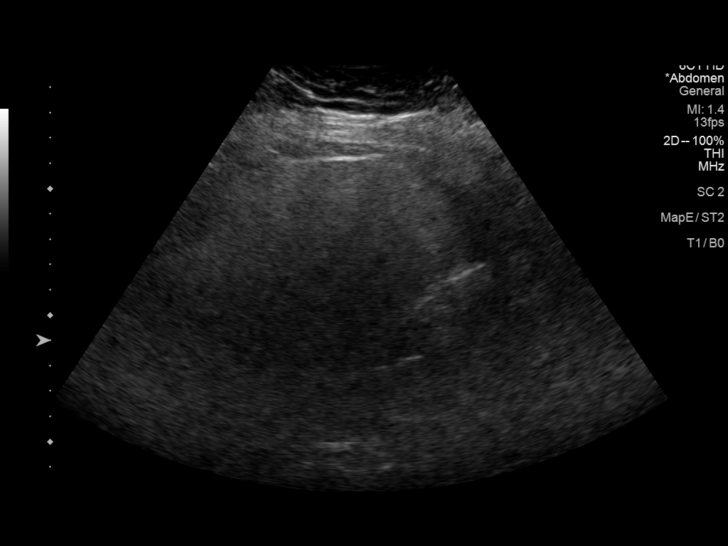
[im 35/42]
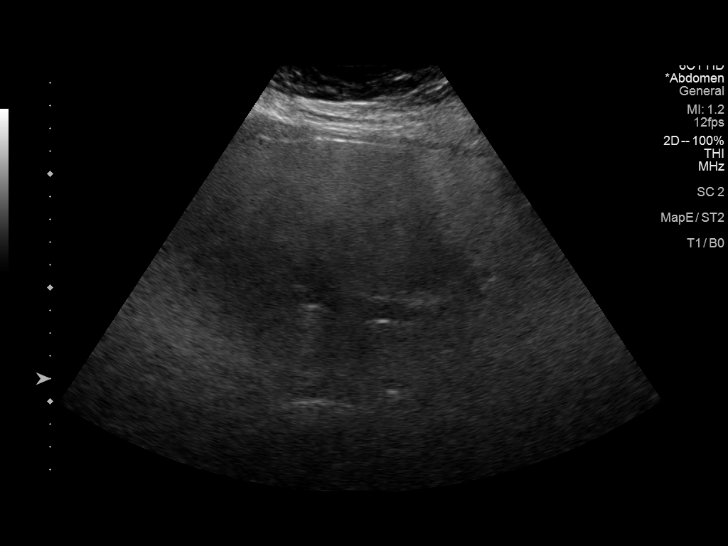
[im 38/42]
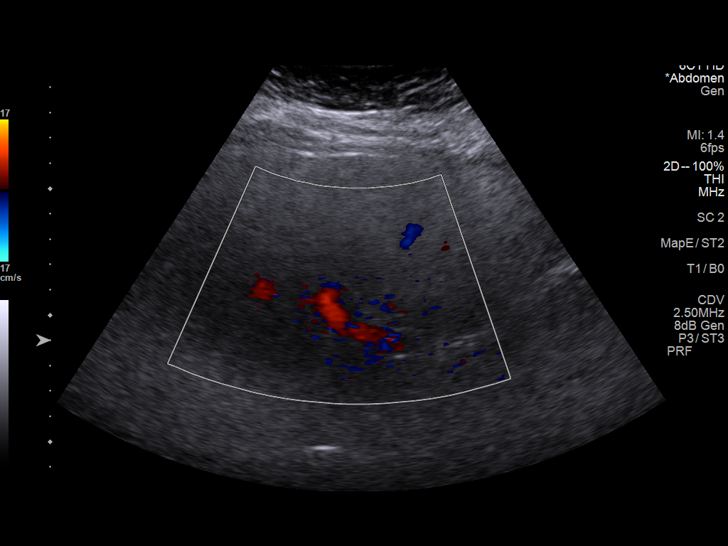
[im 42/42]
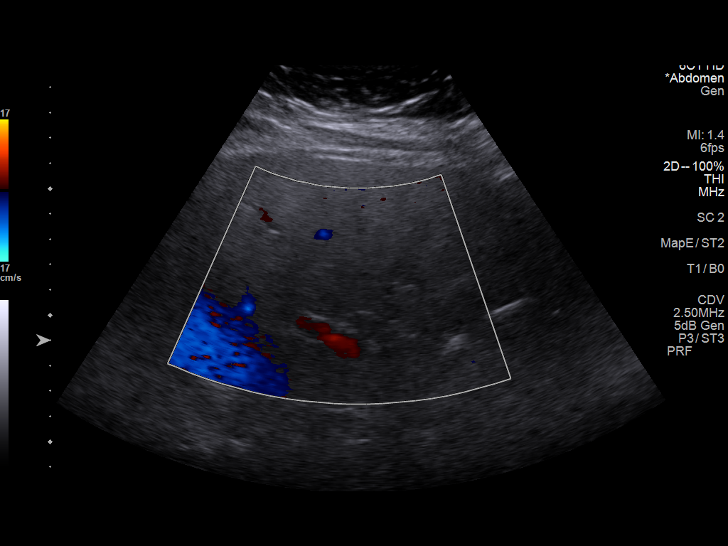

[14 of 25 positions shown; findings below may reference images not displayed]

FINDINGS: Gallbladder:

Previous cholecystectomy.

Common bile duct:

Diameter: 5.0 mm.

Liver:

Increased coarse parenchymal echogenicity compatible with steatosis
without focal mass. Portal vein is patent on color Doppler imaging
with normal direction of blood flow towards the liver.

Other: No free fluid.
IMPRESSION: 1.  Previous cholecystectomy.  No acute hepatobiliary findings.

2.  Evidence of a degree of hepatic steatosis without focal mass.

## 2022-02-25 DIAGNOSIS — M1712 Unilateral primary osteoarthritis, left knee: Secondary | ICD-10-CM | POA: Diagnosis not present

## 2022-03-02 DIAGNOSIS — M5416 Radiculopathy, lumbar region: Secondary | ICD-10-CM | POA: Diagnosis not present

## 2022-03-03 ENCOUNTER — Other Ambulatory Visit: Payer: Self-pay | Admitting: Family Medicine

## 2022-03-03 DIAGNOSIS — K219 Gastro-esophageal reflux disease without esophagitis: Secondary | ICD-10-CM

## 2022-03-05 ENCOUNTER — Telehealth: Payer: Self-pay

## 2022-03-05 ENCOUNTER — Telehealth (HOSPITAL_BASED_OUTPATIENT_CLINIC_OR_DEPARTMENT_OTHER): Payer: Medicare HMO | Admitting: Psychiatry

## 2022-03-05 DIAGNOSIS — F331 Major depressive disorder, recurrent, moderate: Secondary | ICD-10-CM | POA: Diagnosis not present

## 2022-03-05 DIAGNOSIS — R632 Polyphagia: Secondary | ICD-10-CM

## 2022-03-05 DIAGNOSIS — F900 Attention-deficit hyperactivity disorder, predominantly inattentive type: Secondary | ICD-10-CM | POA: Diagnosis not present

## 2022-03-05 DIAGNOSIS — F4310 Post-traumatic stress disorder, unspecified: Secondary | ICD-10-CM | POA: Diagnosis not present

## 2022-03-05 DIAGNOSIS — R69 Illness, unspecified: Secondary | ICD-10-CM | POA: Diagnosis not present

## 2022-03-05 MED ORDER — VILAZODONE HCL 40 MG PO TABS: 40.0000 mg | ORAL_TABLET | Freq: Every day | ORAL | 0 refills | Status: DC

## 2022-03-05 MED ORDER — ATOMOXETINE HCL 60 MG PO CAPS: 60.0000 mg | ORAL_CAPSULE | Freq: Every day | ORAL | 0 refills | Status: DC
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
## 2022-03-05 NOTE — Telephone Encounter (Signed)
Upon review of chart, pt noted to have been seen in ED 02/15/22. Has also not had annual labs drawn recently.  ?Pt notified of above. She is out at restaurant right now, but states she will call back to schedule appt. ?Appt can be scheduled in Dr Martinique 1st available. ?

## 2022-03-09 ENCOUNTER — Other Ambulatory Visit: Payer: Self-pay | Admitting: Allergy

## 2022-03-13 DIAGNOSIS — M5416 Radiculopathy, lumbar region: Secondary | ICD-10-CM | POA: Diagnosis not present

## 2022-03-18 DIAGNOSIS — M5416 Radiculopathy, lumbar region: Secondary | ICD-10-CM | POA: Diagnosis not present

## 2022-03-24 DIAGNOSIS — Z6838 Body mass index (BMI) 38.0-38.9, adult: Secondary | ICD-10-CM | POA: Diagnosis not present

## 2022-03-24 DIAGNOSIS — M797 Fibromyalgia: Secondary | ICD-10-CM | POA: Diagnosis not present

## 2022-03-24 DIAGNOSIS — M5416 Radiculopathy, lumbar region: Secondary | ICD-10-CM | POA: Diagnosis not present

## 2022-03-24 DIAGNOSIS — M5459 Other low back pain: Secondary | ICD-10-CM | POA: Diagnosis not present

## 2022-03-24 DIAGNOSIS — M2550 Pain in unspecified joint: Secondary | ICD-10-CM | POA: Diagnosis not present

## 2022-03-24 DIAGNOSIS — M0609 Rheumatoid arthritis without rheumatoid factor, multiple sites: Secondary | ICD-10-CM | POA: Diagnosis not present

## 2022-03-24 DIAGNOSIS — R7989 Other specified abnormal findings of blood chemistry: Secondary | ICD-10-CM | POA: Diagnosis not present

## 2022-03-24 DIAGNOSIS — Z79899 Other long term (current) drug therapy: Secondary | ICD-10-CM | POA: Diagnosis not present

## 2022-03-24 DIAGNOSIS — E669 Obesity, unspecified: Secondary | ICD-10-CM | POA: Diagnosis not present

## 2022-03-24 DIAGNOSIS — M1991 Primary osteoarthritis, unspecified site: Secondary | ICD-10-CM | POA: Diagnosis not present

## 2022-03-31 NOTE — Progress Notes (Deleted)
? ?Complete physical exam ? ?Patient: Dawn Jordan   DOB: [birth year]   56 y.o. Adult  MRN: [id number] ? ?Subjective:  ?  ?No chief complaint on file. ? ? ?Dawn Jordan is a 56 y.o. adult who presents today for a complete physical exam. Dawn Jordan reports consuming a {diet types:17450} diet. {types:19826} Dawn Jordan generally feels {DESC; WELL/FAIRLY WELL/POORLY:18703}. Dawn Jordan reports sleeping {DESC; WELL/FAIRLY WELL/POORLY:18703}. Dawn Jordan {does/does not:200015} have additional problems to discuss today.  ? ? ?Most recent fall risk assessment: ? ?  12/29/2021  ?  1:00 PM  ?Fall Risk   ?Falls in the past year? 0  ?Number falls in past yr: 0  ?Injury with Fall? 0  ? ?  ?Most recent depression screenings: ? ?  03/05/2022  ?  2:22 PM 12/29/2021  ? 12:58 PM  ?PHQ 2/9 Scores  ?PHQ - 2 Score  0  ?  ? Information is confidential and restricted. Go to Review Flowsheets to unlock data.  ? ? ?{VISON DENTAL STD PSA (Optional):27386} ? ?{History (Optional):23778} ? ?Patient Care Team: ?Martinique, Betty G, MD as PCP - General (Family Medicine) ?Jerline Pain, MD as PCP - Cardiology (Cardiology) ?Gaynelle Arabian, MD as Consulting Physician (Orthopedic Surgery)  ? ?Outpatient Medications Prior to Visit  ?Medication Sig  ? acetaminophen (TYLENOL) 650 MG CR tablet Take 1,300 mg by mouth every 8 (eight) hours as needed for pain.  ? ACTEMRA 162 MG/0.9ML SOSY Inject 162 mg as directed every Tuesday.  ? atomoxetine (STRATTERA) 60 MG capsule Take 1 capsule (60 mg total) by mouth daily.  ? cephALEXin (KEFLEX) 500 MG capsule Take 2 capsules (1,000 mg total) by mouth 2 (two) times daily.  ? clonazePAM (KLONOPIN) 0.5 MG tablet Take 1 tablet (0.5 mg total) by mouth 3 (three) times daily as needed for anxiety.  ? cyclobenzaprine (FLEXERIL) 10 MG tablet Take 10 mg by mouth 3 (three) times daily as needed for muscle spasms.  ? EPINEPHRINE 0.3 mg/0.3 mL IJ SOAJ injection Use for life threatening allergic reactions (Patient not taking: Reported  on 03/05/2022)  ? flecainide (TAMBOCOR) 50 MG tablet Take 1 tablet (50 mg total) by mouth 2 (two) times daily.  ? fluticasone (FLONASE) 50 MCG/ACT nasal spray PLACE ONE SPRAY IN EACH NOSTRIL EVERY DAY (Patient taking differently: 1 spray daily as needed for allergies.)  ? fluticasone (FLOVENT HFA) 220 MCG/ACT inhaler INHALE TWO PUFFS INTO THE LUNGS EVERY MORNING & AT BEDTIME  ? levalbuterol (XOPENEX HFA) 45 MCG/ACT inhaler INHALE TWO PUFFS INTO THE LUNGS EVERY 6 HOURS AS NEEDED FOR WHEEZING  ? levocetirizine (XYZAL) 5 MG tablet Take 1 tablet (5 mg total) by mouth every evening.  ? methocarbamol (ROBAXIN) 500 MG tablet Take 1 tablet (500 mg total) by mouth every 6 (six) hours as needed for muscle spasms.  ? methocarbamol (ROBAXIN) 500 MG tablet Take 1 tablet (500 mg total) by mouth every 6 (six) hours as needed for muscle spasms.  ? metoprolol succinate (TOPROL-XL) 25 MG 24 hr tablet Take 1 tablet (25 mg total) by mouth daily.  ? montelukast (SINGULAIR) 10 MG tablet TAKE 1 TABLET BY MOUTH EVERYDAY AT BEDTIME  ? nitroGLYCERIN (NITROSTAT) 0.4 MG SL tablet Place 1 tablet (0.4 mg total) under the tongue every 5 (five) minutes as needed for chest pain. (Patient not taking: Reported on 03/05/2022)  ? Olopatadine HCl 0.2 % SOLN Place 1 drop into both eyes daily as needed.  ? omeprazole (PRILOSEC) 40 MG capsule TAKE ONE CAPSULE BY MOUTH  EVERY MORNING 30 MINUTES BEFORE MEALS  ? oxyCODONE-acetaminophen (PERCOCET) 5-325 MG tablet Take 1-2 tablets by mouth every 6 (six) hours as needed. (Patient not taking: Reported on 03/05/2022)  ? Polyvinyl Alcohol-Povidone (REFRESH OP) Place 1 drop into both eyes daily.  ? pregabalin (LYRICA) 200 MG capsule Take 200 mg by mouth at bedtime.  ? triamcinolone ointment (KENALOG) 0.1 % Apply 1 application topically 2 (two) times daily. (Patient taking differently: Apply 1 application. topically 2 (two) times daily as needed (eczema).)  ? Vilazodone HCl (VIIBRYD) 40 MG TABS Take 1 tablet (40 mg  total) by mouth daily.  ? ?Facility-Administered Medications Prior to Visit  ?Medication Dose Route Frequency Provider  ? 0.9 %  sodium chloride infusion  500 mL Intravenous Continuous Milus Banister, MD  ? ? ?ROS ? ? ? ? ?   ?Objective:  ? ?  ?LMP  (LMP Unknown)  ?{Vitals History (Optional):23777} ? ?Physical Exam  ? ?No results found for any visits on 04/01/22. ?Last CBC ?Lab Results  ?Component Value Date  ? WBC 5.1 02/15/2022  ? HGB 13.3 02/15/2022  ? HCT 39.2 02/15/2022  ? MCV 85.6 02/15/2022  ? MCH 29.0 02/15/2022  ? RDW 13.2 02/15/2022  ? PLT 152 02/15/2022  ? ?Last metabolic panel ?Lab Results  ?Component Value Date  ? GLUCOSE 102 (H) 02/15/2022  ? NA 140 02/15/2022  ? K 4.3 02/15/2022  ? CL 108 02/15/2022  ? CO2 25 02/15/2022  ? BUN 16 02/15/2022  ? CREATININE 0.81 02/15/2022  ? GFRNONAA >60 02/15/2022  ? CALCIUM 9.4 02/15/2022  ? PROT 6.5 02/15/2022  ? ALBUMIN 4.4 02/15/2022  ? LABGLOB 2.0 12/26/2019  ? AGRATIO 2.2 12/26/2019  ? BILITOT 0.7 02/15/2022  ? ALKPHOS 66 02/15/2022  ? AST 25 02/15/2022  ? ALT 43 02/15/2022  ? ANIONGAP 7 02/15/2022  ? ?Last lipids ?Lab Results  ?Component Value Date  ? CHOL 202 (H) 12/26/2019  ? HDL 42 12/26/2019  ? LDLCALC 125 (H) 12/26/2019  ? TRIG 198 (H) 12/26/2019  ? CHOLHDL 5.2 06/05/2017  ? ?Last hemoglobin A1c ?Lab Results  ?Component Value Date  ? HGBA1C 5.8 (H) 12/26/2019  ? ?Last thyroid functions ?Lab Results  ?Component Value Date  ? TSH 0.405 (L) 12/26/2019  ? T3TOTAL 103 12/26/2019  ? ?Last vitamin D ?Lab Results  ?Component Value Date  ? VD25OH 12.1 (L) 12/26/2019  ? ?Last vitamin B12 and Folate ?Lab Results  ?Component Value Date  ? [id number] 574 04/06/2017  ? ?  ?   ?Assessment & Plan:  ?  ?Routine Health Maintenance and Physical Exam ? ?Immunization History  ?Administered Date(s) Administered  ? Td 12/08/2015  ? ? ?Health Maintenance  ?Topic Date Due  ? COVID-19 Vaccine (1) Never done  ? Zoster Vaccines- Shingrix (1 of 2) Never done  ? PAP SMEAR-Modifier   Never done  ? MAMMOGRAM  04/14/2020  ? INFLUENZA VACCINE  02/05/2029 (Originally 07/07/2022)  ? TETANUS/TDAP  12/07/2025  ? COLONOSCOPY (Pts 45-82yr Insurance coverage will need to be confirmed)  08/05/2027  ? Hepatitis C Screening  Completed  ? HIV Screening  Completed  ? HPV VACCINES  Aged Out  ? ? ?Discussed health benefits of physical activity, and encouraged JWalburga Hudmanto engage in regular exercise appropriate for JAleyah Balikage and condition. ? ?Problem List Items Addressed This Visit   ?None ? ?No follow-ups on file. ? ?  ? ? ? ? ?

## 2022-04-01 ENCOUNTER — Ambulatory Visit (INDEPENDENT_AMBULATORY_CARE_PROVIDER_SITE_OTHER): Payer: Medicare HMO | Admitting: Family Medicine

## 2022-04-01 ENCOUNTER — Encounter: Payer: Self-pay | Admitting: Family Medicine

## 2022-04-01 VITALS — BP 128/80 | HR 77 | Resp 16 | Ht 65.0 in | Wt 236.4 lb

## 2022-04-01 DIAGNOSIS — Z0000 Encounter for general adult medical examination without abnormal findings: Secondary | ICD-10-CM | POA: Diagnosis not present

## 2022-04-01 DIAGNOSIS — R7303 Prediabetes: Secondary | ICD-10-CM | POA: Diagnosis not present

## 2022-04-01 DIAGNOSIS — R69 Illness, unspecified: Secondary | ICD-10-CM | POA: Diagnosis not present

## 2022-04-01 DIAGNOSIS — F331 Major depressive disorder, recurrent, moderate: Secondary | ICD-10-CM | POA: Diagnosis not present

## 2022-04-01 DIAGNOSIS — E785 Hyperlipidemia, unspecified: Secondary | ICD-10-CM | POA: Diagnosis not present

## 2022-04-01 DIAGNOSIS — I4891 Unspecified atrial fibrillation: Secondary | ICD-10-CM

## 2022-04-01 DIAGNOSIS — Z13220 Encounter for screening for lipoid disorders: Secondary | ICD-10-CM

## 2022-04-01 DIAGNOSIS — Z130 Encounter for screening for diseases of the blood and blood-forming organs and certain disorders involving the immune mechanism: Secondary | ICD-10-CM

## 2022-04-01 NOTE — Patient Instructions (Addendum)
A few things to remember from today's visit: ? ?Routine general medical examination at a health care facility ? ?Prediabetes ? ?Hyperlipidemia, unspecified hyperlipidemia type ? ?Do not use My Chart to request refills or for acute issues that need immediate attention. ?  ?Establish with gynecologist, you need a mammogram and pap smear. ?Fasting labs later this week or early next week. ?Calcium with vit D 1200 mg and 800 U respectively. ? ?Please be sure medication list is accurate. ?If a new problem present, please set up appointment sooner than planned today. ? ?Health Maintenance, Female ?Adopting a healthy lifestyle and getting preventive care are important in promoting health and wellness. Ask your health care provider about: ?The right schedule for you to have regular tests and exams. ?Things you can do on your own to prevent diseases and keep yourself healthy. ?What should I know about diet, weight, and exercise? ?Eat a healthy diet ? ?Eat a diet that includes plenty of vegetables, fruits, low-fat dairy products, and lean protein. ?Do not eat a lot of foods that are high in solid fats, added sugars, or sodium. ?Maintain a healthy weight ?Body mass index (BMI) is a measurement that can be used to identify possible weight problems. It estimates body fat based on height and weight. Your health care provider can help determine your BMI and help you achieve or maintain a healthy weight. ?Get regular exercise ?Get regular exercise. This is one of the most important things you can do for your health. Most adults should: ?Exercise for at least 150 minutes each week. The exercise should increase your heart rate and make you sweat (moderate-intensity exercise). ?Do strengthening exercises at least twice a week. This is in addition to the moderate-intensity exercise. ?Spend less time sitting. Even light physical activity can be beneficial. ?Watch cholesterol and blood lipids ?Have your blood tested for lipids and  cholesterol at 57 years of age, then have this test every 5 years. ?You may need to have your cholesterol levels checked more often if: ?Your lipid or cholesterol levels are high. ?You are older than 57 years of age. ?You are at high risk for heart disease. ?What should I know about cancer screening? ?Many types of cancers can be detected early and may often be prevented. Depending on your health history and family history, you may need to have cancer screening at various ages. This may include screening for: ?Colorectal cancer. ?Prostate cancer. ?Skin cancer. ?Lung cancer. ?What should I know about heart disease, diabetes, and high blood pressure? ?Blood pressure and heart disease ?High blood pressure causes heart disease and increases the risk of stroke. This is more likely to develop in people who have high blood pressure readings or are overweight. ?Talk with your health care provider about your target blood pressure readings. ?Have your blood pressure checked: ?Every 3-5 years if you are 27-70 years of age. ?Every year if you are 54 years old or older. ?If you are between the ages of 25 and 44 and are a current or former smoker, ask your health care provider if you should have a one-time screening for abdominal aortic aneurysm (AAA). ?Diabetes ?Have regular diabetes screenings. This checks your fasting blood sugar level. Have the screening done: ?Once every three years after age 16 if you are at a normal weight and have a low risk for diabetes. ?More often and at a younger age if you are overweight or have a high risk for diabetes. ?What should I know about preventing infection? ?Hepatitis  B ?If you have a higher risk for hepatitis B, you should be screened for this virus. Talk with your health care provider to find out if you are at risk for hepatitis B infection. ?Hepatitis C ?Blood testing is recommended for: ?Everyone born from [birth year] through 1966. ?Anyone with known risk factors for hepatitis C. ?Sexually  transmitted infections (STIs) ?You should be screened each year for STIs, including gonorrhea and chlamydia, if: ?You are sexually active and are younger than 57 years of age. ?You are older than 57 years of age and your health care provider tells you that you are at risk for this type of infection. ?Your sexual activity has changed since you were last screened, and you are at increased risk for chlamydia or gonorrhea. Ask your health care provider if you are at risk. ?Ask your health care provider about whether you are at high risk for HIV. Your health care provider may recommend a prescription medicine to help prevent HIV infection. If you choose to take medicine to prevent HIV, you should first get tested for HIV. You should then be tested every 3 months for as long as you are taking the medicine. ?Follow these instructions at home: ?Alcohol use ?Do not drink alcohol if your health care provider tells you not to drink. ?If you drink alcohol: ?Limit how much you have to 0-2 drinks a day. ?Know how much alcohol is in your drink. In the [place] one drink equals one 12 oz bottle of beer (355 mL), one 5 oz glass of wine (148 mL), or one 1? oz glass of hard liquor (44 mL). ?Lifestyle ?Do not use any products that contain nicotine or tobacco. These products include cigarettes, chewing tobacco, and vaping devices, such as e-cigarettes. If you need help quitting, ask your health care provider. ?Do not use street drugs. ?Do not share needles. ?Ask your health care provider for help if you need support or information about quitting drugs. ?General instructions ?Schedule regular health, dental, and eye exams. ?Stay current with your vaccines. ?Tell your health care provider if: ?You often feel depressed. ?You have ever been abused or do not feel safe at home. ?Summary ?Adopting a healthy lifestyle and getting preventive care are important in promoting health and wellness. ?Follow your health care provider's instructions about  healthy diet, exercising, and getting tested or screened for diseases. ?Follow your health care provider's instructions on monitoring your cholesterol and blood pressure. ?This information is not intended to replace advice given to you by your health care provider. Make sure you discuss any questions you have with your health care provider. ?Document Revised: 04/14/2021 Document Reviewed: 04/14/2021 ?Elsevier Patient Education ? [place]. ? ?

## 2022-04-01 NOTE — Progress Notes (Signed)
? ? ?HPI: ? Dawn Jordan is a 56 y.o. adult, who is here today for routine physical. ? ?Last CPE: Over a year ago. ? ?Regular exercise: Started going to the gym yesterday and planning on 2 times per week. ?Following a healthy diet: Trying to cook more at home, grilling most of the time. ? ?Chronic medical problems: OSA,chronic pain,depression,fibromyalgia, anxiety,GERD,atrial fib,ADHD,asthma,and polyarthralgia among some. ?Follows with cardiologist, Dr Marlou Porch. ? ?Immunization History  ?Administered Date(s) Administered  ? Td 12/08/2015  ? ?Health Maintenance  ?Topic Date Due  ? PAP SMEAR-Modifier  Never done  ? MAMMOGRAM  04/14/2020  ? COVID-19 Vaccine (1) 04/17/2022 (Originally 05/28/1967)  ? Zoster Vaccines- Shingrix (1 of 2) 12/11/2022 (Originally 11/27/1984)  ? INFLUENZA VACCINE  02/05/2029 (Originally 07/07/2022)  ? TETANUS/TDAP  12/07/2025  ? COLONOSCOPY (Pts 45-20yr Insurance coverage will need to be confirmed)  08/05/2027  ? Hepatitis C Screening  Completed  ? HIV Screening  Completed  ? HPV VACCINES  Aged Out  ?Not sure about last pap smear, has not established with gyn. ? ?She has no concerns today. ?Prediabetes: Negative for polydipsia,polyuria, or polyphagia. ? ?Lab Results  ?Component Value Date  ? HGBA1C 5.8 (H) 12/26/2019  ? ?HLD:On non pharmacologic treatment. ?Lab Results  ?Component Value Date  ? CHOL 202 (H) 12/26/2019  ? HDL 42 12/26/2019  ? LDLCALC 125 (H) 12/26/2019  ? TRIG 198 (H) 12/26/2019  ? CHOLHDL 5.2 06/05/2017  ? ?Review of Systems  ?Constitutional:  Positive for fatigue. Negative for appetite change and fever.  ?HENT:  Negative for hearing loss, mouth sores and sore throat.   ?Eyes:  Negative for redness and visual disturbance.  ?Respiratory:  Negative for cough, shortness of breath and wheezing.   ?Cardiovascular:  Negative for chest pain and leg swelling.  ?Gastrointestinal:  Negative for abdominal pain, nausea and vomiting.  ?     No changes in bowel habits.  ?Endocrine: Negative for  cold intolerance and heat intolerance.  ?Genitourinary:  Negative for decreased urine volume, dysuria, hematuria, vaginal bleeding and vaginal discharge.  ?Musculoskeletal:  Positive for arthralgias and myalgias. Negative for gait problem.  ?Skin:  Negative for color change and rash.  ?Allergic/Immunologic: Positive for environmental allergies.  ?Neurological:  Negative for syncope, weakness and headaches.  ?Hematological:  Negative for adenopathy. Does not bruise/bleed easily.  ?Psychiatric/Behavioral:  Negative for confusion and hallucinations.   ?All other systems reviewed and are negative. ? ?Current Outpatient Medications on File Prior to Visit  ?Medication Sig Dispense Refill  ? acetaminophen (TYLENOL) 650 MG CR tablet Take 1,300 mg by mouth every 8 (eight) hours as needed for pain.    ? ACTEMRA 162 MG/0.9ML SOSY Inject 162 mg as directed every Tuesday.    ? atomoxetine (STRATTERA) 60 MG capsule Take 1 capsule (60 mg total) by mouth daily. 90 capsule 0  ? clonazePAM (KLONOPIN) 0.5 MG tablet Take 1 tablet (0.5 mg total) by mouth 3 (three) times daily as needed for anxiety. 90 tablet 2  ? cyclobenzaprine (FLEXERIL) 10 MG tablet Take 10 mg by mouth 3 (three) times daily as needed for muscle spasms.    ? EPINEPHRINE 0.3 mg/0.3 mL IJ SOAJ injection Use for life threatening allergic reactions 2 each 0  ? flecainide (TAMBOCOR) 50 MG tablet Take 1 tablet (50 mg total) by mouth 2 (two) times daily. 180 tablet 3  ? fluticasone (FLONASE) 50 MCG/ACT nasal spray PLACE ONE SPRAY IN EACH NOSTRIL EVERY DAY (Patient taking differently: 1 spray daily as needed  for allergies.) 16 g 5  ? fluticasone (FLOVENT HFA) 220 MCG/ACT inhaler INHALE TWO PUFFS INTO THE LUNGS EVERY MORNING & AT BEDTIME 12 g 0  ? levalbuterol (XOPENEX HFA) 45 MCG/ACT inhaler INHALE TWO PUFFS INTO THE LUNGS EVERY 6 HOURS AS NEEDED FOR WHEEZING 15 g 1  ? levocetirizine (XYZAL) 5 MG tablet Take 1 tablet (5 mg total) by mouth every evening. 30 tablet 5  ?  methocarbamol (ROBAXIN) 500 MG tablet Take 1 tablet (500 mg total) by mouth every 6 (six) hours as needed for muscle spasms. 40 tablet 0  ? methocarbamol (ROBAXIN) 500 MG tablet Take 1 tablet (500 mg total) by mouth every 6 (six) hours as needed for muscle spasms. 30 tablet 0  ? metoprolol succinate (TOPROL-XL) 25 MG 24 hr tablet Take 1 tablet (25 mg total) by mouth daily. 90 tablet 3  ? montelukast (SINGULAIR) 10 MG tablet TAKE 1 TABLET BY MOUTH EVERYDAY AT BEDTIME 90 tablet 1  ? nitroGLYCERIN (NITROSTAT) 0.4 MG SL tablet Place 1 tablet (0.4 mg total) under the tongue every 5 (five) minutes as needed for chest pain. 25 tablet prn  ? Olopatadine HCl 0.2 % SOLN Place 1 drop into both eyes daily as needed. 2.5 mL 5  ? omeprazole (PRILOSEC) 40 MG capsule TAKE ONE CAPSULE BY MOUTH EVERY MORNING 30 MINUTES BEFORE MEALS 90 capsule 1  ? oxyCODONE-acetaminophen (PERCOCET) 5-325 MG tablet Take 1-2 tablets by mouth every 6 (six) hours as needed. 8 tablet 0  ? Polyvinyl Alcohol-Povidone (REFRESH OP) Place 1 drop into both eyes daily.    ? pregabalin (LYRICA) 200 MG capsule Take 200 mg by mouth at bedtime.    ? triamcinolone ointment (KENALOG) 0.1 % Apply 1 application topically 2 (two) times daily. (Patient taking differently: Apply 1 application. topically 2 (two) times daily as needed (eczema).) 60 g 5  ? Vilazodone HCl (VIIBRYD) 40 MG TABS Take 1 tablet (40 mg total) by mouth daily. 90 tablet 0  ? ?Current Facility-Administered Medications on File Prior to Visit  ?Medication Dose Route Frequency Provider Last Rate Last Admin  ? 0.9 %  sodium chloride infusion  500 mL Intravenous Continuous Milus Banister, MD      ? ?Past Medical History:  ?Diagnosis Date  ? A-fib ([place])   ? ADD (attention deficit disorder)   ? Anemia   ? Anginal pain ([place])   ? Anxiety   ? Asthma   ? Back pain   ? Bone spur of foot   ? Chest pain   ? Chronic fatigue syndrome   ? Complication of anesthesia   ? woke up during sugery once, and also with  epidural at [facility] [address] had episode with epidural  ? Depression   ? Dyspnea   ? Dysrhythmia   ? AFIB  ? Fibromyalgia   ? GERD (gastroesophageal reflux disease)   ? Headache   ? History of gout   ? History of kidney stones   ? Hypertension   ? IBS (irritable bowel syndrome)   ? Joint pain   ? Left ankle pain   ? Left sciatic nerve pain   ? Myofascial pain   ? Neck pain   ? OSA (obstructive sleep apnea)   ? cpap  ? Palpitations   ? Rheumatoid arthritis ([place])   ? Seasonal allergies   ? ?Past Surgical History:  ?Procedure Laterality Date  ? BILATERAL KNEE ARTHROSCOPY    ? CARPAL TUNNEL RELEASE Left   ?  CYST EXCISION    ? "little cysts taken off both hands and left arm" (06/04/2017)  ? KNEE ARTHROSCOPY Bilateral   ? "3 on my left; 2 on my right" (06/04/2017)  ? KNEE SURGERY    ? LAPAROSCOPIC CHOLECYSTECTOMY    ? TONSILLECTOMY    ? TOTAL KNEE ARTHROPLASTY Right 11/24/2021  ? Procedure: TOTAL KNEE ARTHROPLASTY;  Surgeon: Gaynelle Arabian, MD;  Location: [facility];  Service: Orthopedics;  Laterality: Right;  ? ? ?Allergies  ?Allergen Reactions  ? Eggs Or Egg-Derived Products Diarrhea  ? Latex Anaphylaxis, Hives and Itching  ?  "Anaphylaxis is only when I am in a closed area (ex: car with balloons)"  ? Other Other (See Comments)  ?  Sinus headache from new plastics, carpets, etc.  ? Codeine   ?  Headaches   ? ? ?Family History  ?Problem Relation Age of Onset  ? Arthritis Mother   ? Hyperlipidemia Mother   ? Hypertension Mother   ? Stroke Mother   ? Mental retardation Mother   ? Diabetes Mother   ? Allergic rhinitis Mother   ? Heart disease Mother   ? Thyroid disease Mother   ? Depression Mother   ? Anxiety disorder Mother   ? Bipolar disorder Mother   ? Sleep apnea Mother   ? Eating disorder Mother   ? Diabetes Maternal Grandfather   ? Hypertension Maternal Grandfather   ? Diabetes Paternal Grandmother   ? Hypertension Paternal Grandmother   ? Cancer Paternal Grandmother   ?     breast  ? Allergic rhinitis  Brother   ? Colon cancer Paternal Aunt   ? Lung cancer Other   ? Lung cancer Maternal Uncle   ? Asthma Neg Hx   ? Eczema Neg Hx   ? Immunodeficiency Neg Hx   ? Urticaria Neg Hx   ? Atopy Neg Hx   ? Angioedema Neg Hx   ?

## 2022-04-08 ENCOUNTER — Other Ambulatory Visit: Payer: Medicare HMO

## 2022-04-15 DIAGNOSIS — M5459 Other low back pain: Secondary | ICD-10-CM | POA: Diagnosis not present

## 2022-04-15 DIAGNOSIS — M5416 Radiculopathy, lumbar region: Secondary | ICD-10-CM | POA: Diagnosis not present

## 2022-04-20 DIAGNOSIS — M5136 Other intervertebral disc degeneration, lumbar region: Secondary | ICD-10-CM | POA: Diagnosis not present

## 2022-04-20 DIAGNOSIS — M5030 Other cervical disc degeneration, unspecified cervical region: Secondary | ICD-10-CM | POA: Diagnosis not present

## 2022-04-20 DIAGNOSIS — M5459 Other low back pain: Secondary | ICD-10-CM | POA: Diagnosis not present

## 2022-04-27 ENCOUNTER — Other Ambulatory Visit (HOSPITAL_COMMUNITY): Payer: Self-pay | Admitting: Psychiatry

## 2022-04-27 DIAGNOSIS — F900 Attention-deficit hyperactivity disorder, predominantly inattentive type: Secondary | ICD-10-CM

## 2022-05-26 ENCOUNTER — Telehealth: Payer: Self-pay

## 2022-05-26 NOTE — Telephone Encounter (Signed)
Last documented mammogram 04/14/18 Last CPE 04/01/22  LVM instructions for pt to call back. Transfer to [facility] to schedule mammogram.

## 2022-05-28 ENCOUNTER — Telehealth (HOSPITAL_BASED_OUTPATIENT_CLINIC_OR_DEPARTMENT_OTHER): Payer: Medicare HMO | Admitting: Psychiatry

## 2022-05-28 DIAGNOSIS — F331 Major depressive disorder, recurrent, moderate: Secondary | ICD-10-CM | POA: Diagnosis not present

## 2022-05-28 DIAGNOSIS — F4310 Post-traumatic stress disorder, unspecified: Secondary | ICD-10-CM

## 2022-05-28 DIAGNOSIS — R632 Polyphagia: Secondary | ICD-10-CM | POA: Diagnosis not present

## 2022-05-28 DIAGNOSIS — F900 Attention-deficit hyperactivity disorder, predominantly inattentive type: Secondary | ICD-10-CM | POA: Diagnosis not present

## 2022-05-28 DIAGNOSIS — R69 Illness, unspecified: Secondary | ICD-10-CM | POA: Diagnosis not present

## 2022-05-28 MED ORDER — ATOMOXETINE HCL 80 MG PO CAPS: 80.0000 mg | ORAL_CAPSULE | Freq: Every day | ORAL | 0 refills | Status: DC

## 2022-05-28 MED ORDER — VILAZODONE HCL 40 MG PO TABS: 40.0000 mg | ORAL_TABLET | Freq: Every day | ORAL | 0 refills | Status: DC
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
## 2022-05-29 DIAGNOSIS — M542 Cervicalgia: Secondary | ICD-10-CM | POA: Diagnosis not present

## 2022-06-01 ENCOUNTER — Other Ambulatory Visit: Payer: Self-pay | Admitting: Cardiology

## 2022-06-02 ENCOUNTER — Other Ambulatory Visit: Payer: Self-pay | Admitting: Family Medicine

## 2022-06-02 ENCOUNTER — Other Ambulatory Visit: Payer: Self-pay | Admitting: Allergy

## 2022-06-08 DIAGNOSIS — M542 Cervicalgia: Secondary | ICD-10-CM | POA: Diagnosis not present

## 2022-06-17 DIAGNOSIS — M542 Cervicalgia: Secondary | ICD-10-CM | POA: Diagnosis not present

## 2022-06-23 ENCOUNTER — Other Ambulatory Visit: Payer: Self-pay | Admitting: Family Medicine

## 2022-06-23 DIAGNOSIS — R5383 Other fatigue: Secondary | ICD-10-CM | POA: Diagnosis not present

## 2022-06-23 DIAGNOSIS — Z79899 Other long term (current) drug therapy: Secondary | ICD-10-CM | POA: Diagnosis not present

## 2022-06-23 DIAGNOSIS — Z111 Encounter for screening for respiratory tuberculosis: Secondary | ICD-10-CM | POA: Diagnosis not present

## 2022-06-23 DIAGNOSIS — M0609 Rheumatoid arthritis without rheumatoid factor, multiple sites: Secondary | ICD-10-CM | POA: Diagnosis not present

## 2022-06-29 ENCOUNTER — Other Ambulatory Visit: Payer: Self-pay | Admitting: Family Medicine

## 2022-07-01 DIAGNOSIS — H5203 Hypermetropia, bilateral: Secondary | ICD-10-CM | POA: Diagnosis not present

## 2022-07-15 ENCOUNTER — Encounter (INDEPENDENT_AMBULATORY_CARE_PROVIDER_SITE_OTHER): Payer: Self-pay

## 2022-07-27 ENCOUNTER — Other Ambulatory Visit: Payer: Self-pay | Admitting: Family Medicine

## 2022-08-17 DIAGNOSIS — M542 Cervicalgia: Secondary | ICD-10-CM | POA: Diagnosis not present

## 2022-08-17 DIAGNOSIS — M5459 Other low back pain: Secondary | ICD-10-CM | POA: Diagnosis not present

## 2022-08-17 DIAGNOSIS — M5030 Other cervical disc degeneration, unspecified cervical region: Secondary | ICD-10-CM | POA: Diagnosis not present

## 2022-08-17 DIAGNOSIS — M5136 Other intervertebral disc degeneration, lumbar region: Secondary | ICD-10-CM | POA: Diagnosis not present

## 2022-08-24 ENCOUNTER — Other Ambulatory Visit (HOSPITAL_COMMUNITY): Payer: Self-pay | Admitting: Psychiatry

## 2022-08-27 ENCOUNTER — Other Ambulatory Visit: Payer: Self-pay | Admitting: Cardiology

## 2022-08-27 ENCOUNTER — Other Ambulatory Visit: Payer: Self-pay | Admitting: Allergy

## 2022-08-27 ENCOUNTER — Telehealth (HOSPITAL_BASED_OUTPATIENT_CLINIC_OR_DEPARTMENT_OTHER): Payer: Medicare HMO | Admitting: Psychiatry

## 2022-08-27 ENCOUNTER — Other Ambulatory Visit: Payer: Self-pay | Admitting: Family Medicine

## 2022-08-27 DIAGNOSIS — F900 Attention-deficit hyperactivity disorder, predominantly inattentive type: Secondary | ICD-10-CM

## 2022-08-27 DIAGNOSIS — F4310 Post-traumatic stress disorder, unspecified: Secondary | ICD-10-CM

## 2022-08-27 DIAGNOSIS — F5081 Binge eating disorder: Secondary | ICD-10-CM | POA: Diagnosis not present

## 2022-08-27 DIAGNOSIS — R69 Illness, unspecified: Secondary | ICD-10-CM | POA: Diagnosis not present

## 2022-08-27 DIAGNOSIS — R632 Polyphagia: Secondary | ICD-10-CM

## 2022-08-27 DIAGNOSIS — F331 Major depressive disorder, recurrent, moderate: Secondary | ICD-10-CM

## 2022-08-27 MED ORDER — ATOMOXETINE HCL 100 MG PO CAPS: 100.0000 mg | ORAL_CAPSULE | Freq: Every day | ORAL | 0 refills | Status: DC

## 2022-08-27 MED ORDER — VILAZODONE HCL 40 MG PO TABS: 40.0000 mg | ORAL_TABLET | Freq: Every day | ORAL | 0 refills | Status: DC

## 2022-08-27 NOTE — Progress Notes (Signed)
Virtual Visit via Video Note  I connected with Dawn Jordan on 08/27/22 at  1:30 PM EDT by  a video enabled telemedicine application and verified that I am speaking with the correct person using two identifiers.  Location: Patient: home Provider: office   I discussed the limitations of evaluation and management by telemedicine and the availability of in person appointments. The patient expressed understanding and agreed to proceed.  History of Present Illness: Dawn Jordan shares she is "ok". She has been having some headaches on the right side of her head. Her mom was in the hospital for 1 week about 2 weeks ago due to COPD and heart issues. Her mom is doing better now. Dawn Jordan continues to care for Dawn Jordan. The mother of the Dawn Jordan is pregnant and lost one of the twins yesterday. She has carry the babies full term. The other baby is healthy and doing well.  She is disrespectful to Dawn Jordan at times. It is hard to deal with her at times. She does not want anyone to know that Dawn Jordan is raising and caring for Dawn Jordan instead of her. Dawn Jordan's friend's and family know she is raising Dawn Jordan. The mom has recently started talking about transitioning back into Dawn Jordan's life. It is making Dawn Jordan anxious. Dawn Jordan thinks it would be good for Dawn Jordan, as long as, the mother is stable and active. Dawn Jordan shares that her depression is moderate but manageable. She was down for 3 days in the last 2 weeks. She is hopeful. Dawn Jordan struggles with worthlessness. She denies anhedonia. She denies SI/HI. She is struggling with intrusive memories. She is having random nightmares but they are not every night. Dawn Jordan is still having binging episodes almost daily. Once she gains 5-10 lbs then she makes a big effort to control her diet. Her ADHD is not well controlled. She is starting things but not finishing.    Observations/Objective: Psychiatric Specialty Exam: ROS  There were no vitals taken for this visit.There is no height or weight on file to calculate BMI.  General  Appearance: Casual  Eye Contact:  Fair  Speech:  Clear and Coherent and Normal Rate  Volume:  Normal  Mood:  Anxious and Depressed  Affect:  Blunt  Thought Process:  Coherent and Descriptions of Associations: Circumstantial  Orientation:  Full (Time, Place, and Person)  Thought Content:  Rumination  Suicidal Thoughts:  No  Homicidal Thoughts:  No  Memory:  Immediate;   Good  Judgement:  Good  Insight:  Good  Psychomotor Activity:  Normal  Concentration:  Concentration: Good  Recall:  Good  Fund of Knowledge:  Good  Language:  Good  Akathisia:  No  Handed:  Right  AIMS (if indicated):     Assets:  Communication Skills Desire for Improvement Financial Resources/Insurance Housing Intimacy Leisure Time Resilience Social Support Talents/Skills Transportation Vocational/Educational  ADL's:  Intact  Cognition:  WNL  Sleep:        Assessment and Plan:     08/27/2022    1:54 PM 04/01/2022   11:52 AM 03/05/2022    2:22 PM 12/29/2021   12:58 PM 12/25/2021    2:17 PM  Depression screen PHQ 2/9  Decreased Interest 0 1 0 0 0  Down, Depressed, Hopeless 1 0 0 0 1  PHQ - 2 Score 1 1 0 0 1  Altered sleeping  2     Tired, decreased energy  0     Change in appetite  0     Feeling bad or failure  about yourself   0     Trouble concentrating  2     Moving slowly or fidgety/restless  0     Suicidal thoughts  0     PHQ-9 Score  5     Difficult doing work/chores  Not difficult at all       Flowsheet Row Video Visit from 03/05/2022 in [facility] from 02/15/2022 in [facility] Video Visit from 12/25/2021 in [place] No Risk No Risk No Risk       Pt is aware that these meds carry a teratogenic risk. Pt will discuss plan of action if she does or plans to become pregnant in the future.  Status of current problems: depression is stable. She is  struggling with binge eating and ADHD symptoms  Meds: no refill on Klonopin - she rarely takes it Discussed use of Vyvanse but she declined Increase Strattera '100mg'$  po qD for ADHD 1. MDD (major depressive disorder), recurrent episode, moderate (HCC) - Vilazodone HCl (VIIBRYD) 40 MG TABS; Take 1 tablet (40 mg total) by mouth daily.  Dispense: 90 tablet; Refill: 0  2. PTSD (post-traumatic stress disorder) - Vilazodone HCl (VIIBRYD) 40 MG TABS; Take 1 tablet (40 mg total) by mouth daily.  Dispense: 90 tablet; Refill: 0  3. Attention deficit hyperactivity disorder (ADHD), predominantly inattentive type - atomoxetine (STRATTERA) 100 MG capsule; Take 1 capsule (100 mg total) by mouth daily.  Dispense: 90 capsule; Refill: 0  4. Binge eating     Labs: none    Therapy: brief supportive therapy provided. Discussed psychosocial stressors in detail.      Collaboration of Care: Other none  Patient/Guardian was advised Release of Information must be obtained prior to any record release in order to collaborate their care with an outside provider. Patient/Guardian was advised if they have not already done so to contact the registration department to sign all necessary forms in order for [name] to release information regarding their care.   Consent: Patient/Guardian gives verbal consent for treatment and assignment of benefits for services provided during this visit. Patient/Guardian expressed understanding and agreed to proceed.     Follow Up Instructions: Follow up in 2-3 months or sooner if needed    I discussed the assessment and treatment plan with the patient. The patient was provided an opportunity to ask questions and all were answered. The patient agreed with the plan and demonstrated an understanding of the instructions.   The patient was advised to call back or seek an in-person evaluation if the symptoms worsen or if the condition fails to improve as anticipated.  I provided 28 minutes  of non-face-to-face time during this encounter.   Charlcie Cradle, MD

## 2022-08-31 ENCOUNTER — Other Ambulatory Visit: Payer: Medicare HMO

## 2022-09-02 DIAGNOSIS — H524 Presbyopia: Secondary | ICD-10-CM | POA: Diagnosis not present

## 2022-09-02 DIAGNOSIS — H52223 Regular astigmatism, bilateral: Secondary | ICD-10-CM | POA: Diagnosis not present

## 2022-09-03 ENCOUNTER — Other Ambulatory Visit: Payer: Self-pay | Admitting: Cardiology

## 2022-09-23 DIAGNOSIS — R7989 Other specified abnormal findings of blood chemistry: Secondary | ICD-10-CM | POA: Diagnosis not present

## 2022-09-23 DIAGNOSIS — M1991 Primary osteoarthritis, unspecified site: Secondary | ICD-10-CM | POA: Diagnosis not present

## 2022-09-23 DIAGNOSIS — M797 Fibromyalgia: Secondary | ICD-10-CM | POA: Diagnosis not present

## 2022-09-23 DIAGNOSIS — M0609 Rheumatoid arthritis without rheumatoid factor, multiple sites: Secondary | ICD-10-CM | POA: Diagnosis not present

## 2022-09-23 DIAGNOSIS — Z79899 Other long term (current) drug therapy: Secondary | ICD-10-CM | POA: Diagnosis not present

## 2022-09-23 DIAGNOSIS — M25512 Pain in left shoulder: Secondary | ICD-10-CM | POA: Diagnosis not present

## 2022-09-23 DIAGNOSIS — Z6841 Body Mass Index (BMI) 40.0 and over, adult: Secondary | ICD-10-CM | POA: Diagnosis not present

## 2022-10-14 ENCOUNTER — Telehealth: Payer: Self-pay

## 2022-10-14 ENCOUNTER — Telehealth: Payer: Self-pay | Admitting: *Deleted

## 2022-10-14 NOTE — Telephone Encounter (Signed)
   Pre-operative Risk Assessment    Patient Name: Dawn Jordan  DOB: November 19, 1966 MRN: [id number]      Request for Surgical Clearance    Procedure:   LEFT TOTAL KNEE ARTHROPLASTY  Date of Surgery:  Clearance 01/18/23                                 Surgeon:  DR. Lazarus Gowda Surgeon's Group or Practice Name:  [facility] Phone number:  [phone] Fax number:  [phone] ATTN: [place]   Type of Clearance Requested:   - Medical ; NO MEDICATIONS LISTED AS NEEDING TO BE HELD   Type of Anesthesia:   CHOICE   Additional requests/questions:    Jiles Prows   10/14/2022, 1:15 PM

## 2022-10-14 NOTE — Telephone Encounter (Signed)
    Primary Cardiologist:[name], MD  Chart reviewed as part of pre-operative protocol coverage. Because of Dawn Jordan's past medical history and time since last visit, he/she will require a follow-up visit in order to better assess preoperative cardiovascular risk.  Pre-op covering staff: - Please schedule appointment and call patient to inform them. - Please contact requesting surgeon's office via preferred method (i.e, phone, fax) to inform them of need for appointment prior to surgery.  If applicable, this message will also be routed to pharmacy pool and/or primary cardiologist for input on holding anticoagulant/antiplatelet agent as requested below so that this information is available at time of patient's appointment.   Deberah Pelton, NP  10/14/2022, 1:28 PM

## 2022-10-14 NOTE — Telephone Encounter (Signed)
Patient needs a surgery clearance appointment, surgery isn't until February, so can be scheduled in December/January. Thank you!

## 2022-10-14 NOTE — Telephone Encounter (Signed)
Patient stated that they will call back to schedule. Nothing further needed

## 2022-10-14 NOTE — Telephone Encounter (Signed)
Left message for the pt to call back to schedule IN OFFICE APPT FOR PRE OP. Appt to be no earlier than the end of dec 2023, preferable late Jan 2024. Procedure is set for 01/18/23

## 2022-10-15 NOTE — Telephone Encounter (Signed)
S/w the pt and she has been scheduled to see Dr. Marlou Porch 12/28/22 for pre op clearance at 3 pm.   Will update the requesting office as well.

## 2022-10-19 ENCOUNTER — Other Ambulatory Visit: Payer: Self-pay | Admitting: Allergy

## 2022-10-19 ENCOUNTER — Other Ambulatory Visit (HOSPITAL_COMMUNITY): Payer: Self-pay | Admitting: Psychiatry

## 2022-10-19 ENCOUNTER — Other Ambulatory Visit: Payer: Self-pay | Admitting: Family Medicine

## 2022-10-19 DIAGNOSIS — F900 Attention-deficit hyperactivity disorder, predominantly inattentive type: Secondary | ICD-10-CM

## 2022-10-19 DIAGNOSIS — F331 Major depressive disorder, recurrent, moderate: Secondary | ICD-10-CM

## 2022-10-19 DIAGNOSIS — F4310 Post-traumatic stress disorder, unspecified: Secondary | ICD-10-CM

## 2022-11-04 ENCOUNTER — Encounter: Payer: Self-pay | Admitting: Family Medicine

## 2022-11-04 ENCOUNTER — Ambulatory Visit (INDEPENDENT_AMBULATORY_CARE_PROVIDER_SITE_OTHER): Payer: Medicare HMO | Admitting: Family Medicine

## 2022-11-04 VITALS — BP 120/70 | HR 79 | Temp 97.7°F | Resp 16 | Ht 65.0 in | Wt 254.0 lb

## 2022-11-04 DIAGNOSIS — R051 Acute cough: Secondary | ICD-10-CM | POA: Diagnosis not present

## 2022-11-04 DIAGNOSIS — J4540 Moderate persistent asthma, uncomplicated: Secondary | ICD-10-CM | POA: Diagnosis not present

## 2022-11-04 DIAGNOSIS — K219 Gastro-esophageal reflux disease without esophagitis: Secondary | ICD-10-CM

## 2022-11-04 DIAGNOSIS — J3089 Other allergic rhinitis: Secondary | ICD-10-CM | POA: Diagnosis not present

## 2022-11-04 DIAGNOSIS — J302 Other seasonal allergic rhinitis: Secondary | ICD-10-CM | POA: Diagnosis not present

## 2022-11-04 LAB — POC COVID19 BINAXNOW: SARS Coronavirus 2 Ag: NEGATIVE

## 2022-11-04 MED ORDER — BENZONATATE 100 MG PO CAPS: 100.0000 mg | ORAL_CAPSULE | Freq: Two times a day (BID) | ORAL | 0 refills | Status: AC | PRN

## 2022-11-04 NOTE — Assessment & Plan Note (Signed)
Could be contributing to her cough. Continue Singulair 10 mg daily and Flonase nasal spray. Follows with immunologist.

## 2022-11-04 NOTE — Assessment & Plan Note (Signed)
She is not having wheezing or SOB but could be contributing to her current cough. Recommend trying Xopenex inh 2 puff every 6 hours for a week then as needed for wheezing or shortness of breath.  Continue Flovent and Singulair same dose. Follows with pulmonologist.

## 2022-11-04 NOTE — Patient Instructions (Addendum)
A few things to remember from today's visit:  Acute cough - Plan: POC COVID-19 BinaxNow, CANCELED: POCT respiratory syncytial virus It can be allergic or asthma. Try Xopenex inh 2 puff every 6 hours for a week then as needed for wheezing or shortness of breath.  Monitor for new symptoms. Avoid identified trigger factors.  If you need refills for medications you take chronically, please call your pharmacy. Do not use My Chart to request refills or for acute issues that need immediate attention. If you send a my chart message, it may take a few days to be addressed, specially if I am not in the office.  Please be sure medication list is accurate. If a new problem present, please set up appointment sooner than planned today.

## 2022-11-04 NOTE — Assessment & Plan Note (Signed)
Problem is stable. GERD precautions recommended. Continue Omeprazole 40 mg daily.

## 2022-11-04 NOTE — Assessment & Plan Note (Addendum)
We discussed possible etiologies. She does not have other associated symptoms, does not seem to be infectious. ? Allergic. Avoid possible identified trigger factors. Some of her chronic medical problems can also be contributing factors. Continue monitoring for new symptoms. Plain Mucinex may help. I do not think imaging is needed. Instructed about warning signs.

## 2022-11-04 NOTE — Progress Notes (Signed)
ACUTE VISIT Chief Complaint  Patient presents with   URI    Cough, non productive since Friday. Denies congestion. Hoarseness, could be asthma acting up.    HPI:  Dawn Jordan is a 56 y.o. adult with medical hx significant for asthma, GERD,fibromyalgia,MDD,OSA, and atrial fib here today complaining of persistent cough without any productive mucus since Friday. They deny any associated symptoms such as fever, chills, sore throat, dyspnea,or wheezing. They do not report feeling sick and have tested negative for COVID-19.   Asthma and allergic rhinitis: She is currently taking Flonase nasal spray, Flovent 220 mcg bid, Xopenex 2 puff qid prn, and Singulair 10 mg daily  for management.  They suspect a possible allergic reaction to a sachet with various scents that was recently placed in different rooms of their house. They are unsure if the coughing started before or after the sachet was introduced. They have not experienced any difficulty breathing or wheezing, and the cough seems to improve somewhat with the use of a rescue inhaler.  They have not experienced any recent exposure to sick individuals, and their close contacts, including their partner Dawn Jordan, have not shown any signs of illness.   For asthma and allergies she sees immunologist annually, Dr. Maudie Mercury. Cough This is a new problem. The problem has been unchanged. The cough is Non-productive. Associated symptoms include myalgias (no more than usual). Pertinent negatives include no chest pain, chills, ear congestion, ear pain, fever, headaches, postnasal drip, rash, rhinorrhea or shortness of breath. The symptoms are aggravated by fumes. Dawn Jordan has tried a beta-agonist inhaler and steroid inhaler for the symptoms. Dawn Jordan's past medical history is significant for asthma and environmental allergies.   They deny any symptoms of acid reflux, although they mention consuming spicy foods to alleviate any sour stomach sensations. GERD on  Omeprazole 40 mg daily.  Review of Systems  Constitutional:  Negative for chills and fever.  HENT:  Negative for ear pain, postnasal drip and rhinorrhea.   Respiratory:  Positive for cough. Negative for shortness of breath.   Cardiovascular:  Negative for chest pain.  Gastrointestinal:  Negative for abdominal pain, nausea and vomiting.  Musculoskeletal:  Positive for myalgias (no more than usual).  Skin:  Negative for rash.  Allergic/Immunologic: Positive for environmental allergies.  Neurological:  Negative for headaches.  See other pertinent positives and negatives in HPI.  Current Outpatient Medications on File Prior to Visit  Medication Sig Dispense Refill   acetaminophen (TYLENOL) 650 MG CR tablet Take 1,300 mg by mouth every 8 (eight) hours as needed for pain.     ACTEMRA 162 MG/0.9ML SOSY Inject 162 mg as directed every Tuesday.     atomoxetine (STRATTERA) 100 MG capsule Take 1 capsule (100 mg total) by mouth daily. 90 capsule 0   clonazePAM (KLONOPIN) 0.5 MG tablet Take 1 tablet (0.5 mg total) by mouth 3 (three) times daily as needed for anxiety. 90 tablet 2   EPINEPHRINE 0.3 mg/0.3 mL IJ SOAJ injection Use for life threatening allergic reactions 2 each 0   flecainide (TAMBOCOR) 50 MG tablet TAKE ONE TABLET BY MOUTH TWICE DAILY 60 tablet 0   fluticasone (FLONASE) 50 MCG/ACT nasal spray PLACE ONE SPRAY IN EACH NOSTRIL EVERY DAY 16 g 0   fluticasone (FLOVENT HFA) 220 MCG/ACT inhaler INHALE TWO PUFFS INTO THE LUNGS EVERY MORNING & AT BEDTIME 12 g 0   levalbuterol (XOPENEX HFA) 45 MCG/ACT inhaler INHALE TWO PUFFS INTO THE LUNGS EVERY 6 HOURS AS NEEDED  FOR WHEEZING 15 g 1   levocetirizine (XYZAL) 5 MG tablet Take 1 tablet (5 mg total) by mouth every evening. 30 tablet 5   methocarbamol (ROBAXIN) 500 MG tablet Take 1 tablet (500 mg total) by mouth every 6 (six) hours as needed for muscle spasms. 40 tablet 0   metoprolol succinate (TOPROL-XL) 25 MG 24 hr tablet Take 1 tablet (25 mg total)  by mouth daily. 30 tablet 0   montelukast (SINGULAIR) 10 MG tablet Take 1 tablet (10 mg total) by mouth at bedtime. 30 tablet 5   nitroGLYCERIN (NITROSTAT) 0.4 MG SL tablet Place 1 tablet (0.4 mg total) under the tongue every 5 (five) minutes as needed for chest pain. 25 tablet prn   Olopatadine HCl 0.2 % SOLN Place 1 drop into both eyes daily as needed. 2.5 mL 5   omeprazole (PRILOSEC) 40 MG capsule TAKE ONE CAPSULE BY MOUTH EVERY MORNING 30 MINUTES BEFORE MEALS 90 capsule 1   Oxycodone HCl 10 MG TABS Take 10 mg by mouth daily as needed (pain).     Polyvinyl Alcohol-Povidone (REFRESH OP) Place 1 drop into both eyes daily.     pregabalin (LYRICA) 200 MG capsule Take 200 mg by mouth at bedtime.     triamcinolone ointment (KENALOG) 0.1 % Apply 1 application topically 2 (two) times daily. (Patient taking differently: Apply 1 application  topically 2 (two) times daily as needed (eczema).) 60 g 5   Vilazodone HCl (VIIBRYD) 40 MG TABS Take 1 tablet (40 mg total) by mouth daily. 90 tablet 0   Current Facility-Administered Medications on File Prior to Visit  Medication Dose Route Frequency Provider Last Rate Last Admin   0.9 %  sodium chloride infusion  500 mL Intravenous Continuous Dawn Banister, MD       Past Medical History:  Diagnosis Date   A-fib [facility])    ADD (attention deficit disorder)    Anemia    Anginal pain (HCC)    Anxiety    Asthma    Back pain    Bone spur of foot    Chest pain    Chronic fatigue syndrome    Complication of anesthesia    woke up during sugery once, and also with epidural at [facility] [address] had episode with epidural   Depression    Dyspnea    Dysrhythmia    AFIB   Fibromyalgia    GERD (gastroesophageal reflux disease)    Headache    History of gout    History of kidney stones    Hypertension    IBS (irritable bowel syndrome)    Joint pain    Left ankle pain    Left sciatic nerve pain    Myofascial pain    Neck pain    OSA  (obstructive sleep apnea)    cpap   Palpitations    Rheumatoid arthritis (HCC)    Seasonal allergies    Allergies  Allergen Reactions   Eggs Or Egg-Derived Products Diarrhea   Latex Anaphylaxis, Hives and Itching    "Anaphylaxis is only when I am in a closed area (ex: car with balloons)"   Other Other (See Comments)    Sinus headache from new plastics, carpets, etc.   Codeine     Headaches     Social History   Socioeconomic History   Marital status: Married    Spouse name: Dawn Jordan   Number of children: 0   Years of education: Not on file  Highest education level: Not on file  Occupational History   Occupation: not employed-disabled  Tobacco Use   Smoking status: Never   Smokeless tobacco: Never  Vaping Use   Vaping Use: Former  Substance and Sexual Activity   Alcohol use: No   Drug use: No   Sexual activity: Never  Other Topics Concern   Not on file  Social History Narrative   Born in [place], [place]"   Unemployed, on disability since 1991 for anxiety,    Education: [employer]   Single, no children, lives with her friend (used to live with her mother with stroke, now in ALF)      Social Determinants of Health   Financial Resource Strain: [place]  (12/29/2021)   Overall Financial Resource Strain (CARDIA)    Difficulty of Paying Living Expenses: Not hard at all  Food Insecurity: No Food Insecurity (12/29/2021)   Hunger Vital Sign    Worried About Running Out of Food in the Last Year: Never true    [place] in the Last Year: Never true  Transportation Needs: No Transportation Needs (12/29/2021)   PRAPARE - Hydrologist (Medical): No    Lack of Transportation (Non-Medical): No  Physical Activity: Sufficiently Active (12/29/2021)   Exercise Vital Sign    Days of Exercise per Week: 3 days    Minutes of Exercise per Session: 60 min  Stress: No Stress Concern Present (12/29/2021)   [place]    Feeling of Stress : Not at all  Social Connections: [place] (12/29/2021)   Social Connection and Isolation Panel [NHANES]    Frequency of Communication with Friends and Family: More than three times a week    Frequency of Social Gatherings with Friends and Family: More than three times a week    Attends Religious Services: More than 4 times per year    Active Member of [employer] or Organizations: Yes    Attends Archivist Meetings: More than 4 times per year    Marital Status: Married   Vitals:   11/04/22 1607  BP: 120/70  Pulse: 79  Resp: 16  Temp: 97.7 F (36.5 C)  SpO2: 98%   Body mass index is 42.27 kg/m.  Physical Exam Vitals and nursing note reviewed.  Constitutional:      General: Dawn Jordan is not in acute distress.    Appearance: Dawn Jordan is well-developed. Dawn Jordan is not ill-appearing.  HENT:     Head: Normocephalic and atraumatic.     Right Ear: Tympanic membrane, ear canal and external ear normal.     Left Ear: Tympanic membrane, ear canal and external ear normal.     Nose: No rhinorrhea.     Right Turbinates: Enlarged.     Left Turbinates: Enlarged.     Mouth/Throat:     Mouth: Mucous membranes are moist.     Pharynx: Oropharynx is clear.  Eyes:     Conjunctiva/sclera: Conjunctivae normal.  Cardiovascular:     Rate and Rhythm: Normal rate and regular rhythm.     Heart sounds: No murmur heard. Pulmonary:     Effort: Pulmonary effort is normal. No respiratory distress.     Breath sounds: Normal breath sounds. No stridor.     Comments: Occasional cough during visit. Lymphadenopathy:     Head:     Right side of head: No submandibular adenopathy.  Left side of head: No submandibular adenopathy.     Cervical: No cervical adenopathy.  Skin:    General: Skin is warm.     Findings: No erythema or rash.  Neurological:     Mental Status: Dawn Jordan is alert  and oriented to person, place, and time.  Psychiatric:        Mood and Affect: Mood and affect normal.   ASSESSMENT AND PLAN:  Dawn Jordan is a 56 yo seen today for 3 days of non productive cough.   Acute cough Assessment & Plan: We discussed possible etiologies. She does not have other associated symptoms, does not seem to be infectious. ? Allergic. Avoid possible identified trigger factors. Some of her chronic medical problems can also be contributing factors. Continue monitoring for new symptoms. Plain Mucinex may help. I do not think imaging is needed. Instructed about warning signs.   Orders: -     POC COVID-19 BinaxNow -     Benzonatate; Take 1 capsule (100 mg total) by mouth 2 (two) times daily as needed for up to 10 days.  Dispense: 20 capsule; Refill: 0  Moderate persistent asthma without complication Assessment & Plan: She is not having wheezing or SOB but could be contributing to her current cough. Recommend trying Xopenex inh 2 puff every 6 hours for a week then as needed for wheezing or shortness of breath.  Continue Flovent and Singulair same dose. Follows with pulmonologist.   Gastroesophageal reflux disease, unspecified whether esophagitis present Assessment & Plan: Problem is stable. GERD precautions recommended. Continue Omeprazole 40 mg daily.   Seasonal and perennial allergic rhinitis Assessment & Plan: Could be contributing to her cough. Continue Singulair 10 mg daily and Flonase nasal spray. Follows with immunologist.    Return if symptoms worsen or fail to improve, for keep next appointment.  Dawn Jordan G. [place], MD  [facility]. [place] office.

## 2022-11-10 ENCOUNTER — Other Ambulatory Visit (HOSPITAL_COMMUNITY): Payer: Self-pay | Admitting: Psychiatry

## 2022-11-10 ENCOUNTER — Ambulatory Visit (INDEPENDENT_AMBULATORY_CARE_PROVIDER_SITE_OTHER): Payer: Medicare HMO | Admitting: Family Medicine

## 2022-11-10 ENCOUNTER — Encounter: Payer: Self-pay | Admitting: Family Medicine

## 2022-11-10 ENCOUNTER — Other Ambulatory Visit: Payer: Self-pay | Admitting: Family Medicine

## 2022-11-10 ENCOUNTER — Other Ambulatory Visit: Payer: Self-pay | Admitting: Allergy

## 2022-11-10 VITALS — BP 124/76 | HR 76 | Temp 97.7°F | Ht 65.0 in | Wt 247.6 lb

## 2022-11-10 DIAGNOSIS — R051 Acute cough: Secondary | ICD-10-CM

## 2022-11-10 DIAGNOSIS — F900 Attention-deficit hyperactivity disorder, predominantly inattentive type: Secondary | ICD-10-CM

## 2022-11-10 MED ORDER — PREDNISONE 20 MG PO TABS: ORAL_TABLET | ORAL | 0 refills | Status: DC

## 2022-11-10 MED ORDER — AZITHROMYCIN 250 MG PO TABS: ORAL_TABLET | ORAL | 0 refills | Status: AC

## 2022-11-10 NOTE — Progress Notes (Signed)
Established Patient Office Visit  Subjective   Patient ID: Dawn Jordan, adult    DOB: 1966-03-18  Age: 56 y.o. MRN: [id number]  Chief Complaint  Patient presents with   Follow-up   Wheezing        Cough    Patient complains of cough, Productive cough with clear sputum,     HPI   Patient seen with persistent cough.  She does have history of moderate persistent asthma and is on Flovent, Xopenex, and Singulair regularly.  She had onset of symptoms a day after Thanksgiving.  Mostly dry cough.  Was prescribed Ladona Ridgel on 29 November but not much improvement in cough.  She has bouts with coughing that are severe at times.  No fever.  Has had some nasal congestion.  No hemoptysis.  No dyspnea at rest.  Past Medical History:  Diagnosis Date   A-fib [facility])    ADD (attention deficit disorder)    Anemia    Anginal pain (HCC)    Anxiety    Asthma    Back pain    Bone spur of foot    Chest pain    Chronic fatigue syndrome    Complication of anesthesia    woke up during sugery once, and also with epidural at [facility] [place] [address] had episode with epidural   Depression    Dyspnea    Dysrhythmia    AFIB   Fibromyalgia    GERD (gastroesophageal reflux disease)    Headache    History of gout    History of kidney stones    Hypertension    IBS (irritable bowel syndrome)    Joint pain    Left ankle pain    Left sciatic nerve pain    Myofascial pain    Neck pain    OSA (obstructive sleep apnea)    cpap   Palpitations    Rheumatoid arthritis (HCC)    Seasonal allergies    Past Surgical History:  Procedure Laterality Date   BILATERAL KNEE ARTHROSCOPY     CARPAL TUNNEL RELEASE Left    CYST EXCISION     "little cysts taken off both hands and left arm" (06/04/2017)   KNEE ARTHROSCOPY Bilateral    "3 on my left; 2 on my right" (06/04/2017)   KNEE SURGERY     LAPAROSCOPIC CHOLECYSTECTOMY     TONSILLECTOMY     TOTAL KNEE ARTHROPLASTY Right 11/24/2021   Procedure:  TOTAL KNEE ARTHROPLASTY;  Surgeon: Gaynelle Arabian, MD;  Location: [facility];  Service: Orthopedics;  Laterality: Right;    reports that Suzane Vanderweide has never smoked. Minda Faas has never used smokeless tobacco. Kenia Teagarden reports that Janique Hoefer does not drink alcohol and does not use drugs. family history includes Allergic rhinitis in [place] Siordia's brother and mother; Anxiety disorder in [place] Mclaurin's mother; Arthritis in [place] Profit's mother; Bipolar disorder in [place] Marian's mother; Cancer in [place] Truszkowski's paternal grandmother; Colon cancer in [place] Toney's paternal aunt; Depression in [place] Herrera's mother; Diabetes in [place] Hinch's maternal grandfather, mother, and paternal grandmother; Eating disorder in [place] Yan's mother; Heart disease in [place] Basaldua's mother; Hyperlipidemia in [place] Buchbinder's mother; Hypertension in [place] Brissett's maternal grandfather, mother, and paternal grandmother; Lung cancer in [place] Standish's maternal uncle and another family member; Mental retardation in [place] Dunbar's mother; Sleep apnea in [place] Hundal's mother; Stroke in [place] Hopping's mother; Thyroid disease in [place] Lender's mother. Allergies  Allergen Reactions   Eggs Or Egg-Derived Products  Diarrhea   Latex Anaphylaxis, Hives and Itching    "Anaphylaxis is only when I am in a closed area (ex: car with balloons)"   Other Other (See Comments)    Sinus headache from new plastics, carpets, etc.   Codeine     Headaches     Review of Systems  Constitutional:  Negative for chills and fever.  HENT:  Negative for sinus pain.   Respiratory:  Positive for cough. Negative for hemoptysis.   Cardiovascular:  Negative for chest pain.      Objective:     BP 124/76 (BP Location: Left Arm, Patient Position: Sitting, Cuff Size: Normal)   Pulse 76   Temp 97.7 F (36.5 C) (Oral)   Ht '5\' 5"'$  (1.651 m)   Wt 247 lb 9.6 oz (112.3 kg)   LMP  (LMP Unknown)   SpO2 99%   BMI 41.20 kg/m  BP Readings from Last 3 Encounters:   11/10/22 124/76  11/04/22 120/70  04/01/22 128/80   Wt Readings from Last 3 Encounters:  11/10/22 247 lb 9.6 oz (112.3 kg)  11/04/22 254 lb (115.2 kg)  04/01/22 236 lb 6 oz (107.2 kg)      Physical Exam Vitals reviewed.  Constitutional:      Appearance: Dawn Jordan is obese.  HENT:     Right Ear: Tympanic membrane normal.     Left Ear: Tympanic membrane normal.  Cardiovascular:     Rate and Rhythm: Normal rate.  Pulmonary:     Effort: Pulmonary effort is normal. No respiratory distress.     Breath sounds: No rales.  Neurological:     Mental Status: Dawn Jordan is alert.      No results found for any visits on 11/10/22.    The 10-year ASCVD risk score (Arnett DK, et al., 2019) is: 3.6%    Assessment & Plan:   Patient is seen with persistent cough.  Cough comes in paroxysms.  She specifically is concerned about "walking pneumonia".  Nonfocal exam.  May have reactive airway component with her history of asthma.  No active wheezing at this time noted but she has had some subjective wheezing intermittently.  She is in no respiratory distress with pulse oximetry of 97% room air  -Start prednisone 20 mg 2 tablets daily for 5 days -Start Zithromax for 5 days -Consider chest x-ray if not improving over the next week -Follow-up immediately for any fever or increased dyspnea   Carolann Littler, MD

## 2022-11-12 ENCOUNTER — Telehealth (HOSPITAL_COMMUNITY): Payer: Medicare HMO | Admitting: Psychiatry

## 2022-11-16 ENCOUNTER — Other Ambulatory Visit: Payer: Self-pay

## 2022-11-16 ENCOUNTER — Emergency Department (HOSPITAL_COMMUNITY)
Admission: EM | Admit: 2022-11-16 | Discharge: 2022-11-17 | Disposition: A | Discharge status: Home / Self Care | Payer: Medicare HMO | Attending: Emergency Medicine | Admitting: Emergency Medicine

## 2022-11-16 ENCOUNTER — Encounter (HOSPITAL_COMMUNITY): Payer: Self-pay

## 2022-11-16 ENCOUNTER — Emergency Department (HOSPITAL_COMMUNITY): Payer: Medicare HMO

## 2022-11-16 DIAGNOSIS — R918 Other nonspecific abnormal finding of lung field: Secondary | ICD-10-CM | POA: Diagnosis not present

## 2022-11-16 DIAGNOSIS — J42 Unspecified chronic bronchitis: Secondary | ICD-10-CM | POA: Diagnosis not present

## 2022-11-16 DIAGNOSIS — R059 Cough, unspecified: Secondary | ICD-10-CM | POA: Insufficient documentation

## 2022-11-16 DIAGNOSIS — Z91040 Latex allergy status: Secondary | ICD-10-CM | POA: Diagnosis not present

## 2022-11-16 DIAGNOSIS — R0789 Other chest pain: Secondary | ICD-10-CM | POA: Insufficient documentation

## 2022-11-16 DIAGNOSIS — R110 Nausea: Secondary | ICD-10-CM | POA: Insufficient documentation

## 2022-11-16 DIAGNOSIS — R079 Chest pain, unspecified: Secondary | ICD-10-CM | POA: Diagnosis not present

## 2022-11-16 LAB — TROPONIN I (HIGH SENSITIVITY): Troponin I (High Sensitivity): 8 ng/L (ref <18)

## 2022-11-16 LAB — COMPREHENSIVE METABOLIC PANEL
ALT: 45 U/L — ABNORMAL HIGH (ref 0–44)
AST: 24 U/L (ref 15–41)
Albumin: 4.1 g/dL (ref 3.5–5.0)
Alkaline Phosphatase: 74 U/L (ref 38–126)
Anion gap: 8 (ref 5–15)
BUN: 18 mg/dL (ref 6–20)
CO2: 25 mmol/L (ref 22–32)
Calcium: 9.3 mg/dL (ref 8.9–10.3)
Chloride: 108 mmol/L (ref 98–111)
Creatinine, Ser: 0.93 mg/dL (ref 0.44–1.00)
GFR, Estimated: >60 mL/min (ref >60)
Glucose, Bld: 106 mg/dL — ABNORMAL HIGH (ref 70–99)
Potassium: 3.8 mmol/L (ref 3.5–5.1)
Sodium: 141 mmol/L (ref 135–145)
Total Bilirubin: 0.9 mg/dL (ref 0.3–1.2)
Total Protein: 6.4 g/dL — ABNORMAL LOW (ref 6.5–8.1)

## 2022-11-16 LAB — D-DIMER, QUANTITATIVE: D-Dimer, Quant: <0.27 ug/mL-FEU (ref 0.00–0.50)

## 2022-11-16 LAB — CBC
HCT: 42.4 % (ref 36.0–46.0)
Hemoglobin: 14.2 g/dL (ref 12.0–15.0)
MCH: 29.9 pg (ref 26.0–34.0)
MCHC: 33.5 g/dL (ref 30.0–36.0)
MCV: 89.3 fL (ref 80.0–100.0)
Platelets: 207 10*3/uL (ref 150–400)
RBC: 4.75 MIL/uL (ref 3.87–5.11)
RDW: 12.6 % (ref 11.5–15.5)
WBC: 12.1 10*3/uL — ABNORMAL HIGH (ref 4.0–10.5)
nRBC: 0 % (ref 0.0–0.2)

## 2022-11-16 NOTE — ED Triage Notes (Signed)
Pt BIB [facility] from home c/o left sided CP that radiates into her left shoulder and jaw that started at 5am and has been intermittent since. Pt had 324 ASA and 0.4 of nitro.

## 2022-11-16 NOTE — ED Provider Triage Note (Signed)
Emergency Medicine Provider Triage Evaluation Note  Jasmon Mattice , a 56 y.o. adult  was evaluated in triage.  Pt complains of left sided chest pain that woke her out of her sleep this AM. Lasted for 20 minutes and was relieved with nitro. Reports some SOB, recent drive to [place] yesterday. .  Review of Systems  Positive: Chest pain, SOB Negative: Nausea, vomitin  Physical Exam  BP (!) 143/92 (BP Location: Left Arm)   Pulse 72   Temp 97.7 F (36.5 C) (Oral)   Resp (!) 22   LMP  (LMP Unknown)   SpO2 100%  Gen:   Awake, no distress   Resp:  Normal effort  MSK:   Moves extremities without difficulty  Other:  +TTP of left side of chest  Medical Decision Making  Medically screening exam initiated at 3:29 PM.  Appropriate orders placed.  Cindee Mclester was informed that the remainder of the evaluation will be completed by another provider, this initial triage assessment does not replace that evaluation, and the importance of remaining in the ED until their evaluation is complete.     Osvaldo Shipper, [place] 11/16/22 1530

## 2022-11-17 MED ORDER — NITROGLYCERIN 0.4 MG SL SUBL: 0.4000 mg | SUBLINGUAL_TABLET | SUBLINGUAL | 0 refills | Status: DC | PRN

## 2022-11-17 NOTE — Discharge Instructions (Addendum)
You were seen in the ER today for chest pain. All of your bloodwork, imaging, and EKG were reassuring for no signs of a heart attack at this time. Make sure that you are able to follow up with your primary care provider and cardiologist regarding this ER visit if further workup is needed in the outpatient setting.  If you begin experiencing similar symptoms again, do not hesitate to seek emergency care again at that time.

## 2022-11-17 NOTE — ED Notes (Signed)
Pt able to understand discharge teaching with no difficulties, medications discussed, pt ambulating to lobby for discharge
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
## 2022-11-19 ENCOUNTER — Telehealth: Payer: Self-pay | Admitting: Family Medicine

## 2022-11-19 ENCOUNTER — Telehealth (HOSPITAL_BASED_OUTPATIENT_CLINIC_OR_DEPARTMENT_OTHER): Payer: Medicare HMO | Admitting: Psychiatry

## 2022-11-19 DIAGNOSIS — F331 Major depressive disorder, recurrent, moderate: Secondary | ICD-10-CM

## 2022-11-19 DIAGNOSIS — F4310 Post-traumatic stress disorder, unspecified: Secondary | ICD-10-CM | POA: Diagnosis not present

## 2022-11-19 DIAGNOSIS — F900 Attention-deficit hyperactivity disorder, predominantly inattentive type: Secondary | ICD-10-CM

## 2022-11-19 DIAGNOSIS — R69 Illness, unspecified: Secondary | ICD-10-CM | POA: Diagnosis not present

## 2022-11-19 MED ORDER — VILAZODONE HCL 40 MG PO TABS: 40.0000 mg | ORAL_TABLET | Freq: Every day | ORAL | 0 refills | Status: DC

## 2022-11-19 MED ORDER — ATOMOXETINE HCL 100 MG PO CAPS: 100.0000 mg | ORAL_CAPSULE | Freq: Every day | ORAL | 0 refills | Status: DC

## 2022-11-19 MED ORDER — CLONAZEPAM 0.5 MG PO TABS: 0.5000 mg | ORAL_TABLET | Freq: Three times a day (TID) | ORAL | 2 refills | Status: DC | PRN

## 2022-11-19 NOTE — Telephone Encounter (Signed)
Duplicate please disregard

## 2022-11-19 NOTE — Progress Notes (Signed)
Virtual Visit via Video Note  I connected with Dawn Jordan on 11/19/22 at 11:30 AM EST by  a video enabled telemedicine application and verified that I am speaking with the correct person using two identifiers.  Location: Patient: Home Provider: office   I discussed the limitations of evaluation and management by telemedicine and the availability of in person appointments. The patient expressed understanding and agreed to proceed.  History of Present Illness: Dawn Jordan shares that they are good. Dawn Jordan continues to care for Dawn Jordan, their friend's son. Dawn Jordan expressed her concerns and frustrations in dealing with Dawn Jordan's mom. Dawn Jordan is tired of dealing with her. Dawn Jordan is grieving because Dawn Jordan is back to living with his mom since Thanksgiving and is not coming back. Dawn Jordan admits she is depressed but denies isolation and anhedonia. She is having some crying spells. She is leaning on her social supports. She denies SI/HI. Her anxiety is high and she is having stress induced panic attacks 3-4x/week. She went to the ED recently due to the panic attacks. She was cleared in the ED but she is going to follow up with her cardiologist. She has random flashbacks and intrusive memories. Her ADHD is a little better with the increased dose of Straterra. She ate a lot more food than usual this past weekend while traveling. Dawn Jordan does not want her meds changed at this time.    Observations/Objective: Psychiatric Specialty Exam: ROS  There were no vitals taken for this visit.There is no height or weight on file to calculate BMI.  General Appearance: Casual and Well Groomed  Eye Contact:  Good  Speech:  Clear and Coherent and Normal Rate  Volume:  Normal  Mood:  Anxious  Affect:  Full Range  Thought Process:  Coherent and Descriptions of Associations: Circumstantial  Orientation:  Full (Time, Place, and Person)  Thought Content:  Rumination  Suicidal Thoughts:  No  Homicidal Thoughts:  No  Memory:  Immediate;   Good   Judgement:  Good  Insight:  Good  Psychomotor Activity:  Normal  Concentration:  Concentration: Good  Recall:  Good  Fund of Knowledge:  Good  Language:  Good  Akathisia:  No  Handed:  Right  AIMS (if indicated):     Assets:  Communication Skills Desire for Improvement Financial Resources/Insurance Housing Intimacy Leisure Time Resilience Social Support Talents/Skills Transportation Vocational/Educational  ADL's:  Intact  Cognition:  WNL  Sleep:        Assessment and Plan:     11/04/2022    4:13 PM 08/27/2022    1:54 PM 04/01/2022   11:52 AM 03/05/2022    2:22 PM 12/29/2021   12:58 PM  Depression screen PHQ 2/9  Decreased Interest 0 0 1 0 0  Down, Depressed, Hopeless 0 1 0 0 0  PHQ - 2 Score 0 1 1 0 0  Altered sleeping 0  2    Tired, decreased energy 0  0    Change in appetite 0  0    Feeling bad or failure about yourself  0  0    Trouble concentrating 0  2    Moving slowly or fidgety/restless 0  0    Suicidal thoughts 0  0    PHQ-9 Score 0  5    Difficult doing work/chores   Not difficult at all      [place] [facility] from 11/16/2022 in [place] Video Visit from 03/05/2022 in [facility] from  02/15/2022 in [facility]  C-SSRS RISK CATEGORY No Risk No Risk No Risk         Pt is aware that these meds carry a teratogenic risk. Pt will discuss plan of action if she does or plans to become pregnant in the future.  Status of current problems: ongoing anxiety and some depression and PTSD  Meds:  1. PTSD (post-traumatic stress disorder) - Vilazodone HCl (VIIBRYD) 40 MG TABS; Take 1 tablet (40 mg total) by mouth daily.  Dispense: 90 tablet; Refill: 0 - clonazePAM (KLONOPIN) 0.5 MG tablet; Take 1 tablet (0.5 mg total) by mouth 3 (three) times daily as needed for anxiety.  Dispense: 90 tablet; Refill: 2  2. MDD (major depressive disorder), recurrent  episode, moderate (HCC) - Vilazodone HCl (VIIBRYD) 40 MG TABS; Take 1 tablet (40 mg total) by mouth daily.  Dispense: 90 tablet; Refill: 0  3. Attention deficit hyperactivity disorder (ADHD), predominantly inattentive type - atomoxetine (STRATTERA) 100 MG capsule; Take 1 capsule (100 mg total) by mouth daily.  Dispense: 90 capsule; Refill: 0       Therapy: brief supportive therapy provided. Discussed psychosocial stressors in detail.      Collaboration of Care: Other none  Patient/Guardian was advised Release of Information must be obtained prior to any record release in order to collaborate their care with an outside provider. Patient/Guardian was advised if they have not already done so to contact the registration department to sign all necessary forms in order for [name] to release information regarding their care.   Consent: Patient/Guardian gives verbal consent for treatment and assignment of benefits for services provided during this visit. Patient/Guardian expressed understanding and agreed to proceed.     Follow Up Instructions: Follow up in 2-3 months or sooner if needed    I discussed the assessment and treatment plan with the patient. The patient was provided an opportunity to ask questions and all were answered. The patient agreed with the plan and demonstrated an understanding of the instructions.   The patient was advised to call back or seek an in-person evaluation if the symptoms worsen or if the condition fails to improve as anticipated.  I provided 19 minutes of non-face-to-face time during this encounter.   Charlcie Cradle, MD

## 2022-12-11 ENCOUNTER — Other Ambulatory Visit: Payer: Self-pay | Admitting: Family Medicine

## 2022-12-11 DIAGNOSIS — K219 Gastro-esophageal reflux disease without esophagitis: Secondary | ICD-10-CM

## 2022-12-14 DIAGNOSIS — M1712 Unilateral primary osteoarthritis, left knee: Secondary | ICD-10-CM | POA: Diagnosis not present

## 2022-12-14 NOTE — Progress Notes (Unsigned)
HPI:   Dawn Jordan is a 57 y.o. adult, who is here today to follow on recent hospital visit.  Review of Systems See other pertinent positives and negatives in HPI.  Current Outpatient Medications on File Prior to Visit  Medication Sig Dispense Refill   acetaminophen (TYLENOL) 650 MG CR tablet Take 1,300 mg by mouth every 8 (eight) hours as needed for pain.     ACTEMRA 162 MG/0.9ML SOSY Inject 162 mg as directed every Tuesday.     atomoxetine (STRATTERA) 100 MG capsule Take 1 capsule (100 mg total) by mouth daily. 90 capsule 0   clonazePAM (KLONOPIN) 0.5 MG tablet Take 1 tablet (0.5 mg total) by mouth 3 (three) times daily as needed for anxiety. 90 tablet 2   EPINEPHrine 0.3 mg/0.3 mL IJ SOAJ injection Use for life threatening allergic reactions 1 each 0   flecainide (TAMBOCOR) 50 MG tablet TAKE ONE TABLET BY MOUTH TWICE DAILY 60 tablet 0   fluticasone (FLONASE) 50 MCG/ACT nasal spray PLACE ONE SPRAY IN EACH NOSTRIL EVERY DAY 16 g 0   fluticasone (FLOVENT HFA) 220 MCG/ACT inhaler INHALE TWO PUFFS INTO THE LUNGS EVERY MORNING & AT BEDTIME 12 g 0   levalbuterol (XOPENEX HFA) 45 MCG/ACT inhaler INHALE TWO PUFFS INTO THE LUNGS EVERY 6 HOURS AS NEEDED FOR WHEEZING 15 g 1   levocetirizine (XYZAL) 5 MG tablet Take 1 tablet (5 mg total) by mouth every evening. 30 tablet 5   metoprolol succinate (TOPROL-XL) 25 MG 24 hr tablet Take 1 tablet (25 mg total) by mouth daily. 30 tablet 0   montelukast (SINGULAIR) 10 MG tablet Take 1 tablet (10 mg total) by mouth at bedtime. 30 tablet 5   nitroGLYCERIN (NITROSTAT) 0.4 MG SL tablet Place 1 tablet (0.4 mg total) under the tongue every 5 (five) minutes as needed for chest pain. 25 tablet prn   nitroGLYCERIN (NITROSTAT) 0.4 MG SL tablet Place 1 tablet (0.4 mg total) under the tongue every 5 (five) minutes as needed for chest pain. 30 tablet 0   Olopatadine HCl 0.2 % SOLN Place 1 drop into both eyes daily as needed. 2.5 mL 5   omeprazole (PRILOSEC) 40 MG  capsule TAKE ONE CAPSULE BY MOUTH EVERY MORNING 30 MINUTES BEFORE MEALS 90 capsule 1   Oxycodone HCl 10 MG TABS Take 10 mg by mouth daily as needed (pain).     Polyvinyl Alcohol-Povidone (REFRESH OP) Place 1 drop into both eyes daily.     pregabalin (LYRICA) 200 MG capsule Take 200 mg by mouth at bedtime.     triamcinolone ointment (KENALOG) 0.1 % Apply 1 application topically 2 (two) times daily. (Patient taking differently: Apply 1 application  topically 2 (two) times daily as needed (eczema).) 60 g 5   Vilazodone HCl (VIIBRYD) 40 MG TABS Take 1 tablet (40 mg total) by mouth daily. 90 tablet 0   Current Facility-Administered Medications on File Prior to Visit  Medication Dose Route Frequency Provider Last Rate Last Admin   0.9 %  sodium chloride infusion  500 mL Intravenous Continuous Milus Banister, MD        Past Medical History:  Diagnosis Date   A-fib [facility])    ADD (attention deficit disorder)    Anemia    Anginal pain (HCC)    Anxiety    Asthma    Back pain    Bone spur of foot    Chest pain    Chronic fatigue syndrome    Complication of  anesthesia    woke up during sugery once, and also with epidural at [facility] [address] had episode with epidural   Depression    Dyspnea    Dysrhythmia    AFIB   Fibromyalgia    GERD (gastroesophageal reflux disease)    Headache    History of gout    History of kidney stones    Hypertension    IBS (irritable bowel syndrome)    Joint pain    Left ankle pain    Left sciatic nerve pain    Myofascial pain    Neck pain    OSA (obstructive sleep apnea)    cpap   Palpitations    Rheumatoid arthritis (HCC)    Seasonal allergies    Allergies  Allergen Reactions   Eggs Or Egg-Derived Products Diarrhea   Latex Anaphylaxis, Hives and Itching    "Anaphylaxis is only when I am in a closed area (ex: car with balloons)"   Other Other (See Comments)    Sinus headache from new plastics, carpets, etc.   Codeine     Headaches      Social History   Socioeconomic History   Marital status: Married    Spouse name: Kniyah Khun   Number of children: 0   Years of education: Not on file   Highest education level: Not on file  Occupational History   Occupation: not employed-disabled  Tobacco Use   Smoking status: Never   Smokeless tobacco: Never  Vaping Use   Vaping Use: Former  Substance and Sexual Activity   Alcohol use: No   Drug use: No   Sexual activity: Never  Other Topics Concern   Not on file  Social History Narrative   Born in [place], [place]"   Unemployed, on disability since 1991 for anxiety,    Education: [employer]   Single, no children, lives with her friend (used to live with her mother with stroke, now in ALF)      Social Determinants of Health   Financial Resource Strain: Low Risk  (12/29/2021)   Overall Financial Resource Strain (CARDIA)    Difficulty of Paying Living Expenses: Not hard at all  Food Insecurity: No Food Insecurity (12/29/2021)   Hunger Vital Sign    Worried About Running Out of Food in the Last Year: Never true    Ran Out of Food in the Last Year: Never true  Transportation Needs: No Transportation Needs (12/29/2021)   PRAPARE - Hydrologist (Medical): No    Lack of Transportation (Non-Medical): No  Physical Activity: Sufficiently Active (12/29/2021)   Exercise Vital Sign    Days of Exercise per Week: 3 days    Minutes of Exercise per Session: 60 min  Stress: No Stress Concern Present (12/29/2021)   [place]    Feeling of Stress : Not at all  Social Connections: Grandfather (12/29/2021)   Social Connection and Isolation Panel [NHANES]    Frequency of Communication with Friends and Family: More than three times a week    Frequency of Social Gatherings with Friends and Family: More than three times a week    Attends Religious Services:  More than 4 times per year    Active Member of [employer] or Organizations: Yes    Attends Archivist Meetings: More than 4 times per year    Marital Status: Married    There  were no vitals filed for this visit. There is no height or weight on file to calculate BMI.  Physical Exam  ASSESSMENT AND PLAN:  There are no diagnoses linked to this encounter.  No orders of the defined types were placed in this encounter.   No problem-specific Assessment & Plan notes found for this encounter.   No follow-ups on file.  Aleli Navedo G. Martin[place], MD  [facility]. [place] office.

## 2022-12-15 ENCOUNTER — Encounter: Payer: Self-pay | Admitting: Family Medicine

## 2022-12-15 ENCOUNTER — Other Ambulatory Visit: Payer: Self-pay | Admitting: Family Medicine

## 2022-12-15 ENCOUNTER — Ambulatory Visit (INDEPENDENT_AMBULATORY_CARE_PROVIDER_SITE_OTHER): Payer: Medicare HMO | Admitting: Family Medicine

## 2022-12-15 VITALS — BP 128/84 | HR 92 | Temp 98.3°F | Resp 16 | Ht 65.0 in | Wt 251.0 lb

## 2022-12-15 DIAGNOSIS — R002 Palpitations: Secondary | ICD-10-CM

## 2022-12-15 DIAGNOSIS — I480 Paroxysmal atrial fibrillation: Secondary | ICD-10-CM

## 2022-12-15 DIAGNOSIS — M549 Dorsalgia, unspecified: Secondary | ICD-10-CM

## 2022-12-15 DIAGNOSIS — M797 Fibromyalgia: Secondary | ICD-10-CM

## 2022-12-15 DIAGNOSIS — F331 Major depressive disorder, recurrent, moderate: Secondary | ICD-10-CM

## 2022-12-15 DIAGNOSIS — R7303 Prediabetes: Secondary | ICD-10-CM | POA: Insufficient documentation

## 2022-12-15 DIAGNOSIS — E785 Hyperlipidemia, unspecified: Secondary | ICD-10-CM | POA: Diagnosis not present

## 2022-12-15 DIAGNOSIS — R079 Chest pain, unspecified: Secondary | ICD-10-CM

## 2022-12-15 DIAGNOSIS — R69 Illness, unspecified: Secondary | ICD-10-CM | POA: Diagnosis not present

## 2022-12-15 DIAGNOSIS — K219 Gastro-esophageal reflux disease without esophagitis: Secondary | ICD-10-CM

## 2022-12-15 HISTORY — DX: Major depressive disorder, recurrent, moderate: F33.1

## 2022-12-15 LAB — LIPID PANEL
Cholesterol: 233 mg/dL — ABNORMAL HIGH (ref 0–200)
HDL: 41.8 mg/dL (ref >39.00)
NonHDL: 190.97
Total CHOL/HDL Ratio: 6
Triglycerides: 221 mg/dL — ABNORMAL HIGH (ref 0.0–149.0)
VLDL: 44.2 mg/dL — ABNORMAL HIGH (ref 0.0–40.0)

## 2022-12-15 LAB — TSH: TSH: 0.56 u[IU]/mL (ref 0.35–5.50)

## 2022-12-15 LAB — HEMOGLOBIN A1C: Hgb A1c MFr Bld: 5.6 % (ref 4.6–6.5)

## 2022-12-15 LAB — LDL CHOLESTEROL, DIRECT: Direct LDL: 132 mg/dL

## 2022-12-15 MED ORDER — PANTOPRAZOLE SODIUM 40 MG PO TBEC: 40.0000 mg | DELAYED_RELEASE_TABLET | Freq: Every day | ORAL | 3 refills | Status: DC

## 2022-12-15 NOTE — Patient Instructions (Addendum)
A few things to remember from today's visit:  Heart palpitations - Plan: TSH  MDD (major depressive disorder), recurrent episode, moderate ([place]), Chronic  Atrial fibrillation, unspecified type ([place]), Chronic  Upper back pain on left side  Fibromyalgia syndrome  Hyperlipidemia, unspecified hyperlipidemia type - Plan: Lipid panel  Gastroesophageal reflux disease, unspecified whether esophagitis present - Plan: pantoprazole (PROTONIX) 40 MG tablet  Prediabetes - Plan: Hemoglobin A1c  Stop Omeprazole and try Pantoprazole 40 mg 30 min before breakfast. Keep appt with cardiologist. I do not think you need a neck MRI or neurosurgeon referral at this time.  If you need refills for medications you take chronically, please call your pharmacy. Do not use My Chart to request refills or for acute issues that need immediate attention. If you send a my chart message, it may take a few days to be addressed, specially if I am not in the office.  Please be sure medication list is accurate. If a new problem present, please set up appointment sooner than planned today.

## 2022-12-15 NOTE — Assessment & Plan Note (Signed)
Non pharmacologic treatment recommended for now. Further recommendations will be given according to 10 years CVD risk score and lipid panel numbers. 

## 2022-12-15 NOTE — Assessment & Plan Note (Signed)
Last HgA1C 5.8 in 12/2019. Consistency with a healthy life style encouraged for diabetes prevention.

## 2022-12-15 NOTE — Assessment & Plan Note (Signed)
Some of symptoms reported today could be related to this problem. Continue adequate sleep hygiene. Low impact regular/stretching exercises will also help.

## 2022-12-15 NOTE — Assessment & Plan Note (Signed)
This problem can be contributing to chest discomfort as well as persistent cough. She agrees with changing Omeprazole for Protonix 40 mg daily x 8 weeks. Continue GERD precautions.

## 2022-12-15 NOTE — Assessment & Plan Note (Signed)
Seen on cardiac monitor, lasted about 2 hours. Continue Metoprolol and Flecainide same dose. She has appt with cardiologist.

## 2022-12-17 NOTE — Assessment & Plan Note (Signed)
Problem has been going on for a while but getting worse. We discussed possible etiologies: ? Musculoskeletal,GERD and cardiac among some. Instructed about warning signs. Keeps appt with cardiologist.

## 2022-12-17 NOTE — Assessment & Plan Note (Signed)
Problem is stable. Following with psychiatrist.

## 2022-12-18 ENCOUNTER — Other Ambulatory Visit: Payer: Self-pay | Admitting: Cardiology

## 2022-12-24 DIAGNOSIS — M0609 Rheumatoid arthritis without rheumatoid factor, multiple sites: Secondary | ICD-10-CM | POA: Diagnosis not present

## 2022-12-25 ENCOUNTER — Telehealth: Payer: Self-pay | Admitting: *Deleted

## 2022-12-25 NOTE — Patient Outreach (Signed)
  Care Coordination   12/25/2022 Name: Dawn Jordan MRN: [id number] DOB: 19-May-1966   Care Coordination Outreach Attempts:  An unsuccessful telephone outreach was attempted today to offer the patient information about available care coordination services as a benefit of their health plan.   Follow Up Plan:  Additional outreach attempts will be made to offer the patient care coordination information and services.   Encounter Outcome:  No Answer   Care Coordination Interventions:  No, not indicated    Raina Mina, RN Care Management Coordinator [place] Office [phone]

## 2022-12-28 ENCOUNTER — Encounter: Payer: Self-pay | Admitting: Cardiology

## 2022-12-28 ENCOUNTER — Other Ambulatory Visit: Payer: Self-pay | Admitting: Cardiology

## 2022-12-28 ENCOUNTER — Ambulatory Visit: Payer: Medicare HMO | Attending: Cardiology | Admitting: Cardiology

## 2022-12-28 ENCOUNTER — Telehealth: Payer: Self-pay | Admitting: Family Medicine

## 2022-12-28 VITALS — BP 122/82 | HR 67 | Ht 65.0 in | Wt 248.8 lb

## 2022-12-28 DIAGNOSIS — R079 Chest pain, unspecified: Secondary | ICD-10-CM

## 2022-12-28 NOTE — Telephone Encounter (Signed)
Left message for patient to call back and schedule Medicare Annual Wellness Visit (AWV) either virtually or in office. Left  my Dawn Jordan number [phone]   Last AWV 12/29/21 please schedule with Nurse Health Adviser   45 min for awv-i  in office appointments 30 min for awv-s & awv-i phone/virtual appointments

## 2022-12-28 NOTE — Patient Instructions (Signed)
Medication Instructions:  The current medical regimen is effective;  continue present plan and medications.  *If you need a refill on your cardiac medications before your next appointment, please call your pharmacy*  Testing/Procedures: How to Prepare for Your Cardiac PET/CT Stress Test:  1. Please do not take these medications before your test:   Medications that may interfere with the cardiac pharmacological stress agent (ex. nitrates - including erectile dysfunction medications, isosorbide mononitrate or beta-blockers) the day of the exam. (Erectile dysfunction medication should be held for at least 72 hrs prior to test) Theophylline containing medications for 12 hours. Dipyridamole 48 hours prior to the test. Your remaining medications may be taken with water.  2. Nothing to eat or drink, except water, 3 hours prior to arrival time.   NO caffeine/decaffeinated products, or chocolate 12 hours prior to arrival.  3. NO perfume, cologne or lotion  4. Total time is 1 to 2 hours; you may want to bring reading material for the waiting time.  5. Please report to Admitting at the [facility] Main Entrance 30 minutes early for your test.  [place], [place] [address]  Diabetic Preparation:  Hold oral medications. You may take NPH and Lantus insulin. Do not take Humalog or Humulin R (Regular Insulin) the day of your test. Check blood sugars prior to leaving the house. If able to eat breakfast prior to 3 hour fasting, you may take all medications, including your insulin, Do not worry if you miss your breakfast dose of insulin - start at your next meal.  IF YOU THINK YOU MAY BE PREGNANT, OR ARE NURSING PLEASE INFORM THE TECHNOLOGIST.  In preparation for your appointment, medication and supplies will be purchased.  Appointment availability is limited, so if you need to cancel or reschedule, please call the Radiology Department at [phone]  24 hours in advance to  avoid a cancellation fee of $100.00  What to Expect After you Arrive:  Once you arrive and check in for your appointment, you will be taken to a preparation room within the Radiology Department.  A technologist or Nurse will obtain your medical history, verify that you are correctly prepped for the exam, and explain the procedure.  Afterwards,  an IV will be started in your arm and electrodes will be placed on your skin for EKG monitoring during the stress portion of the exam. Then you will be escorted to the PET/CT scanner.  There, staff will get you positioned on the scanner and obtain a blood pressure and EKG.  During the exam, you will continue to be connected to the EKG and blood pressure machines.  A small, safe amount of a radioactive tracer will be injected in your IV to obtain a series of pictures of your heart along with an injection of a stress agent.    After your Exam:  It is recommended that you eat a meal and drink a caffeinated beverage to counter act any effects of the stress agent.  Drink plenty of fluids for the remainder of the day and urinate frequently for the first couple of hours after the exam.  Your doctor will inform you of your test results within 7-10 business days.  For questions about your test or how to prepare for your test, please call: Marchia Bond, Cardiac Imaging Nurse Navigator  Gordy Clement, Cardiac Imaging Nurse Navigator Office: [phone]    Follow-Up: At [facility], you and your health needs are our priority.  As part of our continuing mission to provide you with exceptional heart care, we have created designated Provider Care Teams.  These Care Teams include your primary Cardiologist (physician) and Advanced Practice Providers (APPs -  Physician Assistants and Nurse Practitioners) who all work together to provide you with the care you need, when you need it.  We recommend signing up for the patient portal called "MyChart".  Sign up  information is provided on this After Visit Summary.  MyChart is used to connect with patients for Virtual Visits (Telemedicine).  Patients are able to view lab/test results, encounter notes, upcoming appointments, etc.  Non-urgent messages can be sent to your provider as well.   To learn more about what you can do with MyChart, go to [email or web address].    Your next appointment:   1 year(s)  Provider:   Candee Furbish, MD

## 2022-12-28 NOTE — Progress Notes (Signed)
Cardiology Office Note:    Date:  12/29/2022   ID:  Dawn Jordan, DOB 05-18-1966, MRN [id number]  PCP:  Martinique, Betty G, MD  Cardiologist:  Candee Furbish, MD  Electrophysiologist:  None   Referring MD: Martinique, Betty G, MD     History of Present Illness:    Dawn Jordan is a 57 y.o. adult with a hx of palpitations, atrial fibrillation, anxiety and GERD.  She comes in today with chest discomfort.  She also has possible upcoming knee surgery.   She has had recurrent chest discomfort.  Previously on 08/27/2021 telephone call, they reported chest pains. Today, Dawn Jordan reports chest pain episodes that have been tight, burning and squeezing but mostly pressure. Episodes also spreads to Dawn Jordan's neck and arms and appears at rest, standing or going out. Briannia come on and off and last for about 10 - 20 minutes. Dawn Jordan includes these episodes had happened before but were not bothersome. Dawn Jordan states taking Prilosec 40 mg.   Dawn Jordan report when using to have the episodes back then, Dawn Jordan noticed them coming when eating [place] food as well. Dawn Jordan prior nurse told Dawn Jordan it might just be her anxiety. [name] reports during the school years, Dawn Jordan would have panic attacks due to Dawn Jordan being bully or walking the mall by herself. Dawn Jordan includes taking Klonopin 0.5 mg. Dawn Jordan believes the anxiety was truly chest pain.   Dawn Jordan's mother was diagnosed with a heart disease that she had several blockages in her 32s-70s and had seizures. Dawn Jordan's brother had a heart attack at age 48.  Dawn Jordan signed up for the [employer] to help less tension.   Dawn Jordan currently does not drink or smoke.   12/28/2022: Chest pain.  3 AM squeezing discomfort can she palpitations as well short moving.  Took an aspirin, tried to bend over, nitroglycerin felt terrible.  Tried to relax, YouTube video relaxation techniques.  She has previously laid on her left side and noted palpitations.  Chest discomfort has been an issue for her for quite some time.  Past Medical  History:  Diagnosis Date   A-fib [facility])    ADD (attention deficit disorder)    Anemia    Anginal pain (HCC)    Anxiety    Asthma    Back pain    Bone spur of foot    Chest pain    Chronic fatigue syndrome    Complication of anesthesia    woke up during sugery once, and also with epidural at surgery [facility] [address] had episode with epidural   Depression    Dyspnea    Dysrhythmia    AFIB   Fibromyalgia    GERD (gastroesophageal reflux disease)    Headache    History of gout    History of kidney stones    Hypertension    IBS (irritable bowel syndrome)    Joint pain    Left ankle pain    Left sciatic nerve pain    Myofascial pain    Neck pain    OSA (obstructive sleep apnea)    cpap   Palpitations    Rheumatoid arthritis (HCC)    Seasonal allergies     Past Surgical History:  Procedure Laterality Date   BILATERAL KNEE ARTHROSCOPY     CARPAL TUNNEL RELEASE Left    CYST EXCISION     "little cysts taken off both hands and left arm" (06/04/2017)   KNEE ARTHROSCOPY Bilateral    "3 on my left; 2 on  my right" (06/04/2017)   KNEE SURGERY     LAPAROSCOPIC CHOLECYSTECTOMY     TONSILLECTOMY     TOTAL KNEE ARTHROPLASTY Right 11/24/2021   Procedure: TOTAL KNEE ARTHROPLASTY;  Surgeon: Gaynelle Arabian, MD;  Location: [facility];  Service: Orthopedics;  Laterality: Right;    Current Medications: Current Meds  Medication Sig   acetaminophen (TYLENOL) 650 MG CR tablet Take 1,300 mg by mouth every 8 (eight) hours as needed for pain.   ACTEMRA 162 MG/0.9ML SOSY Inject 162 mg as directed every Tuesday.   atomoxetine (STRATTERA) 100 MG capsule Take 1 capsule (100 mg total) by mouth daily.   clonazePAM (KLONOPIN) 0.5 MG tablet Take 1 tablet (0.5 mg total) by mouth 3 (three) times daily as needed for anxiety.   EPINEPHrine 0.3 mg/0.3 mL IJ SOAJ injection Use for life threatening allergic reactions   flecainide (TAMBOCOR) 50 MG tablet TAKE ONE TABLET BY MOUTH TWICE DAILY   fluticasone  (FLONASE) 50 MCG/ACT nasal spray PLACE ONE SPRAY IN EACH NOSTRIL EVERY DAY   fluticasone (FLOVENT HFA) 220 MCG/ACT inhaler INHALE TWO PUFFS INTO THE LUNGS EVERY MORNING & AT BEDTIME   levalbuterol (XOPENEX HFA) 45 MCG/ACT inhaler INHALE TWO PUFFS INTO THE LUNGS EVERY 6 HOURS AS NEEDED FOR WHEEZING   levocetirizine (XYZAL) 5 MG tablet Take 1 tablet (5 mg total) by mouth every evening.   metoprolol succinate (TOPROL-XL) 25 MG 24 hr tablet Take 1 tablet (25 mg total) by mouth daily.   montelukast (SINGULAIR) 10 MG tablet Take 1 tablet (10 mg total) by mouth at bedtime.   nitroGLYCERIN (NITROSTAT) 0.4 MG SL tablet Place 1 tablet (0.4 mg total) under the tongue every 5 (five) minutes as needed for chest pain.   Olopatadine HCl 0.2 % SOLN Place 1 drop into both eyes daily as needed.   Oxycodone HCl 10 MG TABS Take 10 mg by mouth daily as needed (pain).   pantoprazole (PROTONIX) 40 MG tablet Take 1 tablet (40 mg total) by mouth daily.   Polyvinyl Alcohol-Povidone (REFRESH OP) Place 1 drop into both eyes daily.   pregabalin (LYRICA) 200 MG capsule Take 200 mg by mouth at bedtime.   triamcinolone ointment (KENALOG) 0.1 % Apply 1 application topically 2 (two) times daily. (Patient taking differently: Apply 1 application  topically 2 (two) times daily as needed (eczema).)   Vilazodone HCl (VIIBRYD) 40 MG TABS Take 1 tablet (40 mg total) by mouth daily.   [DISCONTINUED] nitroGLYCERIN (NITROSTAT) 0.4 MG SL tablet Place 1 tablet (0.4 mg total) under the tongue every 5 (five) minutes as needed for chest pain.   Current Facility-Administered Medications for the 12/28/22 encounter (Office Visit) with Jerline Pain, MD  Medication   0.9 %  sodium chloride infusion     Allergies:   Eggs or egg-derived products, Latex, Other, and Codeine   Social History   Socioeconomic History   Marital status: Married    Spouse name: Kathrina Crosley   Number of children: 0   Years of education: Not on file   Highest  education level: Not on file  Occupational History   Occupation: not employed-disabled  Tobacco Use   Smoking status: Never   Smokeless tobacco: Never  Vaping Use   Vaping Use: Former  Substance and Sexual Activity   Alcohol use: No   Drug use: No   Sexual activity: Never  Other Topics Concern   Not on file  Social History Narrative   Born in [place], [place]  Unemployed, on disability since 1991 for anxiety,    Education: [employer]   Single, no children, lives with her friend (used to live with her mother with stroke, now in ALF)      Social Determinants of Health   Financial Resource Strain: Low Risk  (12/29/2021)   Overall Financial Resource Strain (CARDIA)    Difficulty of Paying Living Expenses: Not hard at all  Food Insecurity: No Food Insecurity (12/29/2021)   Hunger Vital Sign    Worried About Running Out of Food in the Last Year: Never true    [place] in the Last Year: Never true  Transportation Needs: No Transportation Needs (12/29/2021)   PRAPARE - Hydrologist (Medical): No    Lack of Transportation (Non-Medical): No  Physical Activity: Sufficiently Active (12/29/2021)   Exercise Vital Sign    Days of Exercise per Week: 3 days    Minutes of Exercise per Session: 60 min  Stress: No Stress Concern Present (12/29/2021)   [place]    Feeling of Stress : Not at all  Social Connections: [place] (12/29/2021)   Social Connection and Isolation Panel [NHANES]    Frequency of Communication with Friends and Family: More than three times a week    Frequency of Social Gatherings with Friends and Family: More than three times a week    Attends Religious Services: More than 4 times per year    Active Member of [employer] or Organizations: Yes    Attends Music therapist: More than 4 times per year    Marital Status: Married      Family History: The patient's family history includes Allergic rhinitis in [place] Santosuosso's brother and mother; Anxiety disorder in [place] Miklas's mother; Arthritis in [place] Sano's mother; Bipolar disorder in [place] Deyoung's mother; Cancer in [place] Deloney's paternal grandmother; Colon cancer in [place] Bounds's paternal aunt; Depression in [place] Metallo's mother; Diabetes in [place] Lull's maternal grandfather, mother, and paternal grandmother; Eating disorder in [place] Glasner's mother; Heart disease in [place] Lanes's mother; Hyperlipidemia in [place] Wojtaszek's mother; Hypertension in [place] Fantini's maternal grandfather, mother, and paternal grandmother; Lung cancer in [place] Lindor's maternal uncle and another family member; Mental retardation in [place] Escoe's mother; Sleep apnea in [place] Gabriel's mother; Stroke in [place] Atienza's mother; Thyroid disease in [place] Keeter's mother. There is no history of Asthma, Eczema, Immunodeficiency, Urticaria, Atopy, Angioedema, Esophageal cancer, Stomach cancer, or Rectal cancer.  ROS:   Please see the history of present illness.    (+) chest pain  All other systems reviewed and are negative.  EKGs/Labs/Other Studies Reviewed:    The following studies were reviewed today:  ETT 08/20:  There was no ST segment deviation noted during stress. The patient walked for 1 minutes and 23 seconds of a standard Bruce protocol treadmill test. She achieved a peak heart rate of 116 which is 69% predicted maximal heart rate. She stopped due to inability to keep up with the treadmill. At peak exercise there were no ST or T wave changes to suggest ischemia. There was also no evidence of QRS widening to suggest flecainide toxicity. This is interpreted as a nondiagnostic exercise test with regard to the question of ischemia. There was no evidence of QRS widening or flecainide toxicity at her maximal exertional effort.  LONG TERM MONITOR 06/2019:  2 hours of Atrial fibrillation noted at night with  rapid ventricular response Occasional PAC's,  premature atrial contractions, and PAT, paroxsymal atrial tachycardia (with brief abberency noted-benign) No pauses   Given CHADSVASC of 1 (Female) she does not require anticoagulation at this time. No CAD on CT of coronaries I would like to try her on Flecainide '50mg'$  PO BID, with Toprol '25mg'$  PO QD to help suppress atrial fibrillation.  Normal EF Please have her come in for treadmill, ETT 1-2 weeks after starting flecainide.  Also, let's set her up for a sleep study (AFIB on monitor happened at night)  Have her come back in and see me in 4 weeks.   CORONARY CT 06/2019:  IMPRESSION: 1. Coronary calcium score of 0. This was 0 percentile for age and sex matched control.   2. Normal coronary origin with right dominance.   3. No evidence of CAD.  CAD-RADS 0.   LEXISCAN 03/20:  The left ventricular ejection fraction is normal (55-65%). Nuclear stress EF: 61%. No T wave inversion was noted during stress. There was no ST segment deviation noted during stress. This is a low risk study. Defect 1: There is a small defect of mild severity at the apex   No reversible ischemia. Small fixed apical artifact, previously seen in 2016. LVEF 61% with normal wall motion. This is a low risk study.  ECHO 06/18:  Impressions:  - Mild LVH with LVEF approximately 55% and normal diastolic    function. Mildly calcified mitral annulus with mild mitral    regurgitation. Mild aortic regurgitation. Trivial tricuspid    regurgitation with PASP 21 mmHg.   EKG:   09/22: sinus bradycardia, HR 57 bpm, non-specific T-wave flattening  Recent Labs: 11/16/2022: ALT 45; BUN 18; Creatinine, Ser 0.93; Hemoglobin 14.2; Platelets 207; Potassium 3.8; Sodium 141 12/15/2022: TSH 0.56  Recent Lipid Panel    Component Value Date/Time   CHOL 233 (H) 12/15/2022 0953   CHOL 202 (H) 12/26/2019 1257   TRIG 221.0 (H) 12/15/2022 0953   HDL 41.80 12/15/2022 0953   HDL 42 12/26/2019  1257   CHOLHDL 6 12/15/2022 0953   VLDL 44.2 (H) 12/15/2022 0953   LDLCALC 125 (H) 12/26/2019 1257   LDLDIRECT 132.0 12/15/2022 0953    Physical Exam:    VS:  BP 122/82   Pulse 67   Ht '5\' 5"'$  (1.651 m)   Wt 248 lb 12.8 oz (112.9 kg)   LMP  (LMP Unknown)   SpO2 99%   BMI 41.40 kg/m     Wt Readings from Last 3 Encounters:  12/28/22 248 lb 12.8 oz (112.9 kg)  12/15/22 251 lb (113.9 kg)  11/10/22 247 lb 9.6 oz (112.3 kg)     GEN:  Well nourished, well developed in no acute distress HEENT: Normal NECK: No JVD; No carotid bruits LYMPHATICS: No lymphadenopathy CARDIAC: RRR, no murmurs, rubs, gallops RESPIRATORY:  Clear to auscultation without rales, wheezing or rhonchi  ABDOMEN: Soft, non-tender, non-distended MUSCULOSKELETAL:  No edema; No deformity  SKIN: Warm and dry NEUROLOGIC:  Alert and oriented x 3 PSYCHIATRIC:  Normal affect   ASSESSMENT:    1. Chest pain of uncertain etiology     PLAN:     Chest pain of uncertain etiology Overall reassuring ECG today, no changes from prior.  Coronary CT scan performed 2020 years ago showed a coronary calcium score of 0 and no coronary artery disease by contrast.  Excellent.  She does have a brother who in his 38s had a myocardial infarction after playing hockey.  Lets check a cardiac PET stress to  look for any evidence of microvascular disease.  Preop cardiovascular exam We are going to get a stress PET scan.  Will wait on results of this before final preop note.  We will follow-up with results of stress test, otherwise 1 year follow-up   Medication Adjustments/Labs and Tests Ordered: Current medicines are reviewed at length with the patient today.  Concerns regarding medicines are outlined above.  Orders Placed This Encounter  Procedures   NM PET CT CARDIAC PERFUSION MULTI W/ABSOLUTE BLOODFLOW   No orders of the defined types were placed in this encounter.   Patient Instructions  Medication Instructions:  The current  medical regimen is effective;  continue present plan and medications.  *If you need a refill on your cardiac medications before your next appointment, please call your pharmacy*  Testing/Procedures: How to Prepare for Your Cardiac PET/CT Stress Test:  1. Please do not take these medications before your test:   Medications that may interfere with the cardiac pharmacological stress agent (ex. nitrates - including erectile dysfunction medications, isosorbide mononitrate or beta-blockers) the day of the exam. (Erectile dysfunction medication should be held for at least 72 hrs prior to test) Theophylline containing medications for 12 hours. Dipyridamole 48 hours prior to the test. Your remaining medications may be taken with water.  2. Nothing to eat or drink, except water, 3 hours prior to arrival time.   NO caffeine/decaffeinated products, or chocolate 12 hours prior to arrival.  3. NO perfume, cologne or lotion  4. Total time is 1 to 2 hours; you may want to bring reading material for the waiting time.  5. Please report to Admitting at the [facility] Entr[facility] 30 minutes early for your test.  [place], [place] [address]  Diabetic Preparation:  Hold oral medications. You may take NPH and Lantus insulin. Do not take Humalog or Humulin R (Regular Insulin) the day of your test. Check blood sugars prior to leaving the house. If able to eat breakfast prior to 3 hour fasting, you may take all medications, including your insulin, Do not worry if you miss your breakfast dose of insulin - start at your next meal.  IF YOU THINK YOU MAY BE PREGNANT, OR ARE NURSING PLEASE INFORM THE TECHNOLOGIST.  In preparation for your appointment, medication and supplies will be purchased.  Appointment availability is limited, so if you need to cancel or reschedule, please call the Radiology Department at [phone]  24 hours in advance to avoid a cancellation fee of  $100.00  What to Expect After you Arrive:  Once you arrive and check in for your appointment, you will be taken to a preparation room within the Radiology Department.  A technologist or Nurse will obtain your medical history, verify that you are correctly prepped for the exam, and explain the procedure.  Afterwards,  an IV will be started in your arm and electrodes will be placed on your skin for EKG monitoring during the stress portion of the exam. Then you will be escorted to the PET/CT scanner.  There, staff will get you positioned on the scanner and obtain a blood pressure and EKG.  During the exam, you will continue to be connected to the EKG and blood pressure machines.  A small, safe amount of a radioactive tracer will be injected in your IV to obtain a series of pictures of your heart along with an injection of a stress agent.    After your Exam:  It is  recommended that you eat a meal and drink a caffeinated beverage to counter act any effects of the stress agent.  Drink plenty of fluids for the remainder of the day and urinate frequently for the first couple of hours after the exam.  Your doctor will inform you of your test results within 7-10 business days.  For questions about your test or how to prepare for your test, please call: Marchia Bond, Cardiac Imaging Nurse Navigator  Gordy Clement, Cardiac Imaging Nurse Navigator Office: [phone]    Follow-Up: At [facility], you and your health needs are our priority.  As part of our continuing mission to provide you with exceptional heart care, we have created designated Provider Care Teams.  These Care Teams include your primary Cardiologist (physician) and Advanced Practice Providers (APPs -  Physician Assistants and Nurse Practitioners) who all work together to provide you with the care you need, when you need it.  We recommend signing up for the patient portal called "MyChart".  Sign up information is provided on this  After Visit Summary.  MyChart is used to connect with patients for Virtual Visits (Telemedicine).  Patients are able to view lab/test results, encounter notes, upcoming appointments, etc.  Non-urgent messages can be sent to your provider as well.   To learn more about what you can do with MyChart, go to [email or web address].    Your next appointment:   1 year(s)  Provider:   Candee Furbish, MD         Ardell Isaacs as a scribe for Candee Furbish, MD.,have documented all relevant documentation on the behalf of Candee Furbish, MD,as directed by  Candee Furbish, MD while in the presence of Candee Furbish, MD.  I, Candee Furbish, MD, have reviewed all documentation for this visit. The documentation on 12/29/22 for the exam, diagnosis, procedures, and orders are all accurate and complete.   Signed, Candee Furbish, MD  12/29/2022 10:58 AM    [facility]

## 2022-12-29 DIAGNOSIS — M1712 Unilateral primary osteoarthritis, left knee: Secondary | ICD-10-CM | POA: Diagnosis not present

## 2022-12-30 NOTE — Addendum Note (Signed)
Addended by: Ronie Spies on: 12/30/2022 08:31 AM   Modules accepted: Orders

## 2023-01-05 ENCOUNTER — Encounter (HOSPITAL_COMMUNITY): Admission: RE | Admit: 2023-01-05 | Payer: Medicare HMO | Source: Ambulatory Visit

## 2023-01-07 ENCOUNTER — Encounter (HOSPITAL_COMMUNITY): Payer: Self-pay | Admitting: Clinical

## 2023-01-07 ENCOUNTER — Ambulatory Visit (INDEPENDENT_AMBULATORY_CARE_PROVIDER_SITE_OTHER): Payer: Medicare HMO | Admitting: Clinical

## 2023-01-07 ENCOUNTER — Encounter (HOSPITAL_COMMUNITY): Payer: Self-pay

## 2023-01-07 DIAGNOSIS — F331 Major depressive disorder, recurrent, moderate: Secondary | ICD-10-CM

## 2023-01-07 DIAGNOSIS — F419 Anxiety disorder, unspecified: Secondary | ICD-10-CM

## 2023-01-07 DIAGNOSIS — F4310 Post-traumatic stress disorder, unspecified: Secondary | ICD-10-CM | POA: Diagnosis not present

## 2023-01-07 DIAGNOSIS — F900 Attention-deficit hyperactivity disorder, predominantly inattentive type: Secondary | ICD-10-CM

## 2023-01-07 DIAGNOSIS — R69 Illness, unspecified: Secondary | ICD-10-CM | POA: Diagnosis not present
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
## 2023-01-12 ENCOUNTER — Other Ambulatory Visit: Payer: Self-pay | Admitting: Family Medicine

## 2023-01-13 ENCOUNTER — Telehealth: Payer: Self-pay | Admitting: Family Medicine

## 2023-01-13 NOTE — Telephone Encounter (Signed)
Spoke with patient to schedule Medicare Annual Wellness Visit (AWV) either virtually or in office.    Pt stated she wanted to check her calendar and would call me back    Last AWV   12/29/21 please schedule with Nurse Health Adviser   45 min for awv-i  in office appointments 30 min for awv-s & awv-i phone/virtual appointments

## 2023-01-15 ENCOUNTER — Telehealth (HOSPITAL_COMMUNITY): Payer: Self-pay | Admitting: *Deleted

## 2023-01-15 NOTE — Telephone Encounter (Signed)
Reaching out to patient to offer assistance regarding upcoming cardiac imaging study; pt verbalizes understanding of appt date/time, parking situation and where to check in, pre-test NPO status and verified current allergies; name and call back number provided for further questions should they arise  Gordy Clement RN Navigator Cardiac Imaging [facility] [phone] office [phone] cell  Patient aware to avoid caffeine 12 hours prior to her cardiac PET scan.

## 2023-01-18 ENCOUNTER — Ambulatory Visit: Admit: 2023-01-18 | Payer: Medicare HMO | Admitting: Orthopedic Surgery

## 2023-01-18 ENCOUNTER — Ambulatory Visit (INDEPENDENT_AMBULATORY_CARE_PROVIDER_SITE_OTHER): Payer: Medicare HMO | Admitting: Clinical

## 2023-01-18 DIAGNOSIS — F4310 Post-traumatic stress disorder, unspecified: Secondary | ICD-10-CM

## 2023-01-18 DIAGNOSIS — R69 Illness, unspecified: Secondary | ICD-10-CM | POA: Diagnosis not present

## 2023-01-18 DIAGNOSIS — F900 Attention-deficit hyperactivity disorder, predominantly inattentive type: Secondary | ICD-10-CM | POA: Diagnosis not present

## 2023-01-18 DIAGNOSIS — F419 Anxiety disorder, unspecified: Secondary | ICD-10-CM

## 2023-01-18 DIAGNOSIS — F331 Major depressive disorder, recurrent, moderate: Secondary | ICD-10-CM

## 2023-01-18 SURGERY — ARTHROPLASTY, KNEE, TOTAL
Anesthesia: Choice | Site: Knee | Laterality: Left
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
## 2023-01-19 ENCOUNTER — Encounter (HOSPITAL_COMMUNITY)
Admission: RE | Admit: 2023-01-19 | Discharge: 2023-01-19 | Disposition: A | Discharge status: Home / Self Care | Payer: Medicare HMO | Source: Ambulatory Visit | Attending: Cardiology | Admitting: Cardiology

## 2023-01-19 DIAGNOSIS — R079 Chest pain, unspecified: Secondary | ICD-10-CM

## 2023-01-19 LAB — NM PET CT CARDIAC PERFUSION MULTI W/ABSOLUTE BLOODFLOW
MBFR: 2.87
Nuc Rest EF: 53 %
Nuc Stress EF: 67 %
Rest MBF: 0.67 ml/g/min
Rest Nuclear Isotope Dose: 29.2 mCi
ST Depression (mm): 0 mm
Stress MBF: 1.92 ml/g/min
Stress Nuclear Isotope Dose: 29.2 mCi
TID: 0.89

## 2023-01-19 MED ORDER — RUBIDIUM RB82 GENERATOR (RUBYFILL)
29.2000 | PACK | Freq: Once | INTRAVENOUS | Status: AC
  Administered 2023-01-19: 29.2 via INTRAVENOUS

## 2023-01-19 MED ORDER — DEXTROSE 5 % IV SOLN
INTRAVENOUS | Status: AC
  Filled 2023-01-19: qty 50

## 2023-01-19 MED ORDER — CAFFEINE CITRATE BASE COMPONENT 10 MG/ML IV SOLN
INTRAVENOUS | Status: AC
  Filled 2023-01-19: qty 3

## 2023-01-19 MED ORDER — REGADENOSON 0.4 MG/5ML IV SOLN: 0.4000 mg | Freq: Once | INTRAVENOUS | Status: AC

## 2023-01-19 MED ORDER — REGADENOSON 0.4 MG/5ML IV SOLN
INTRAVENOUS | Status: AC
  Administered 2023-01-19: 0.4 mg via INTRAVENOUS
  Filled 2023-01-19: qty 5

## 2023-01-19 NOTE — Progress Notes (Signed)
Pre VS

## 2023-01-19 NOTE — Progress Notes (Signed)
Tolerated test well

## 2023-02-01 ENCOUNTER — Encounter (HOSPITAL_COMMUNITY): Payer: Self-pay | Admitting: Clinical

## 2023-02-01 ENCOUNTER — Ambulatory Visit (INDEPENDENT_AMBULATORY_CARE_PROVIDER_SITE_OTHER): Payer: Medicare HMO | Admitting: Clinical

## 2023-02-01 ENCOUNTER — Other Ambulatory Visit (HOSPITAL_COMMUNITY): Payer: Self-pay | Admitting: Psychiatry

## 2023-02-01 DIAGNOSIS — F419 Anxiety disorder, unspecified: Secondary | ICD-10-CM

## 2023-02-01 DIAGNOSIS — R69 Illness, unspecified: Secondary | ICD-10-CM | POA: Diagnosis not present

## 2023-02-01 DIAGNOSIS — F4310 Post-traumatic stress disorder, unspecified: Secondary | ICD-10-CM | POA: Diagnosis not present

## 2023-02-01 DIAGNOSIS — F900 Attention-deficit hyperactivity disorder, predominantly inattentive type: Secondary | ICD-10-CM

## 2023-02-01 DIAGNOSIS — F331 Major depressive disorder, recurrent, moderate: Secondary | ICD-10-CM | POA: Diagnosis not present
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
## 2023-02-11 ENCOUNTER — Telehealth (HOSPITAL_COMMUNITY): Payer: Medicare HMO | Admitting: Psychiatry

## 2023-02-15 ENCOUNTER — Other Ambulatory Visit: Payer: Self-pay | Admitting: Family Medicine

## 2023-02-15 DIAGNOSIS — K219 Gastro-esophageal reflux disease without esophagitis: Secondary | ICD-10-CM

## 2023-02-18 ENCOUNTER — Telehealth (HOSPITAL_BASED_OUTPATIENT_CLINIC_OR_DEPARTMENT_OTHER): Payer: Medicare HMO | Admitting: Psychiatry

## 2023-02-18 DIAGNOSIS — F4310 Post-traumatic stress disorder, unspecified: Secondary | ICD-10-CM

## 2023-02-18 DIAGNOSIS — F331 Major depressive disorder, recurrent, moderate: Secondary | ICD-10-CM

## 2023-02-18 DIAGNOSIS — R632 Polyphagia: Secondary | ICD-10-CM

## 2023-02-18 DIAGNOSIS — F4322 Adjustment disorder with anxiety: Secondary | ICD-10-CM

## 2023-02-18 DIAGNOSIS — F900 Attention-deficit hyperactivity disorder, predominantly inattentive type: Secondary | ICD-10-CM | POA: Diagnosis not present

## 2023-02-18 DIAGNOSIS — R69 Illness, unspecified: Secondary | ICD-10-CM | POA: Diagnosis not present

## 2023-02-18 MED ORDER — ATOMOXETINE HCL 100 MG PO CAPS: 100.0000 mg | ORAL_CAPSULE | Freq: Every day | ORAL | 0 refills | Status: DC

## 2023-02-18 MED ORDER — CLONAZEPAM 0.5 MG PO TABS: 0.5000 mg | ORAL_TABLET | Freq: Three times a day (TID) | ORAL | 1 refills | Status: DC | PRN

## 2023-02-18 MED ORDER — VILAZODONE HCL 40 MG PO TABS: 40.0000 mg | ORAL_TABLET | Freq: Every day | ORAL | 0 refills | Status: DC

## 2023-02-18 NOTE — Progress Notes (Signed)
Virtual Visit via Video Note  I connected with Dawn Jordan on 02/18/23 at  1:45 PM EDT by  a video enabled telemedicine application and verified that I am speaking with the correct person using two identifiers.  Location: Patient: home Provider: office   I discussed the limitations of evaluation and management by telemedicine and the availability of in person appointments. The patient expressed understanding and agreed to proceed.  History of Present Illness: Dawn Jordan shares things in little a crazy right now. Her mom moved in with [name] a week ago. They are all trying to adjust and find the right daily routine. It is a lot of pressure. Her anxiety is high and she is overwhelmed. She has racing thoughts, restlessness and insomnia. 2 weeks prior to her mom moving in with her Dawn Jordan's anxiety is extremely high due to worry that she wouldn't be able to handle her mom's care at home. Her PTSD worsened during this time due to intrusive thoughts. It is not hard to care for her mom but she definitely needs help from her partner. Dawn Jordan has been dealing with a flair of fibromyalgia and RA. It is very painful and it usually happens when she is stressed. Dawn Jordan has been staying up later than usual due to caring for her mom. It takes her hours to asleep.  Dawn Jordan is no longer caring for her friend's child. Dawn Jordan shares that she has been so busy so she hasn't been paying attention to her depression. She is still pushing to do what she needs to.She denies SI/HI.  Her focus is poor but is trying to do better with help from her partner. Dawn Jordan had been eating more and drinking more soda. This week she denies any binging episode and is working with her therapist.    Observations/Objective: Psychiatric Specialty Exam: ROS  There were no vitals taken for this visit.There is no height or weight on file to calculate BMI.  General Appearance: Fairly Groomed and Neat  Eye Contact:  Good  Speech:  Clear and Coherent and Normal Rate   Volume:  Normal  Mood:  Anxious  Affect:  Congruent  Thought Process:  Coherent and Descriptions of Associations: Circumstantial  Orientation:  Full (Time, Place, and Person)  Thought Content:  Rumination  Suicidal Thoughts:  No  Homicidal Thoughts:  No  Memory:  Immediate;   Good  Judgement:  Good  Insight:  Good  Psychomotor Activity:  Normal  Concentration:  Concentration: Good  Recall:  Good  Fund of Knowledge:  Good  Language:  Good  Akathisia:  No  Handed:  Right  AIMS (if indicated):     Assets:  Communication Skills Desire for Improvement Financial Resources/Insurance [place] Talents/Skills Transportation Vocational/Educational  ADL's:  Intact  Cognition:  WNL  Sleep:        Assessment and Plan:     01/07/2023    9:21 AM 12/15/2022    9:54 AM 12/15/2022    9:02 AM 12/15/2022    9:01 AM 11/04/2022    4:13 PM  Depression screen PHQ 2/9  Decreased Interest 0 0 0 0 0  Down, Depressed, Hopeless 2 1 0 0 0  PHQ - 2 Score 2 1 0 0 0  Altered sleeping 3 2 0 0 0  Tired, decreased energy 1 0 0 0 0  Change in appetite 3 3 0 0 0  Feeling bad or failure about yourself  3 0 0 0 0  Trouble concentrating 3 1  0 0 0  Moving slowly or fidgety/restless 3 0 0 0 0  Suicidal thoughts 0 0 0  0  PHQ-9 Score 18 7 0 0 0  Difficult doing work/chores Somewhat difficult Not difficult at all Not difficult at all      [employer] Video Visit from 02/18/2023 in [facility] Counselor from 01/07/2023 in [place] at [facility] ED from 11/16/2022 in [facility] Emergency Department at [place] No Risk No Risk No Risk          Pt is aware that these meds carry a teratogenic risk. Pt will discuss plan of action if she does or plans to become pregnant in the future.  Status of current problems: feeling overwhelmed due to stress of mom moving in. She is  having ADHD symptoms.    Medication management with supportive therapy. Risks and benefits, side effects and alternative treatment options discussed with patient. Pt was given an opportunity to ask questions about medication, illness, and treatment. All current psychiatric medications have been reviewed and discussed with the patient and adjusted as clinically appropriate.  Pt verbalized understanding and verbal consent obtained for treatment.  Meds: Major is going to try taking 1/2 Klonopin when anxious 1. Adjustment disorder with anxious mood  2. Attention deficit hyperactivity disorder (ADHD), predominantly inattentive type - atomoxetine (STRATTERA) 100 MG capsule; Take 1 capsule (100 mg total) by mouth daily.  Dispense: 90 capsule; Refill: 0  3. PTSD (post-traumatic stress disorder) - clonazePAM (KLONOPIN) 0.5 MG tablet; Take 1 tablet (0.5 mg total) by mouth 3 (three) times daily as needed for anxiety.  Dispense: 90 tablet; Refill: 1 - Vilazodone HCl (VIIBRYD) 40 MG TABS; Take 1 tablet (40 mg total) by mouth daily.  Dispense: 90 tablet; Refill: 0  4. MDD (major depressive disorder), recurrent episode, moderate (HCC) - Vilazodone HCl (VIIBRYD) 40 MG TABS; Take 1 tablet (40 mg total) by mouth daily.  Dispense: 90 tablet; Refill: 0  5. Binge eating     Labs: none    Therapy: brief supportive therapy provided. Discussed psychosocial stressors in detail.   Discussed the importance of self care and care giver fatigue.   Collaboration of Care: Other none  Patient/Guardian was advised Release of Information must be obtained prior to any record release in order to collaborate their care with an outside provider. Patient/Guardian was advised if they have not already done so to contact the registration department to sign all necessary forms in order for [name] to release information regarding their care.   Consent: Patient/Guardian gives verbal consent for treatment and assignment of benefits for  services provided during this visit. Patient/Guardian expressed understanding and agreed to proceed.     Follow Up Instructions: Follow up in 2-3 months or sooner if needed    I discussed the assessment and treatment plan with the patient. The patient was provided an opportunity to ask questions and all were answered. The patient agreed with the plan and demonstrated an understanding of the instructions.   The patient was advised to call back or seek an in-person evaluation if the symptoms worsen or if the condition fails to improve as anticipated.  I provided 19 minutes of non-face-to-face time during this encounter.   Charlcie Cradle, MD

## 2023-02-23 ENCOUNTER — Other Ambulatory Visit: Payer: Self-pay

## 2023-02-23 MED ORDER — METOPROLOL SUCCINATE ER 25 MG PO TB24: 25.0000 mg | ORAL_TABLET | Freq: Every day | ORAL | 3 refills | Status: DC

## 2023-03-01 ENCOUNTER — Encounter (HOSPITAL_COMMUNITY): Payer: Self-pay | Admitting: Clinical

## 2023-03-01 ENCOUNTER — Ambulatory Visit (INDEPENDENT_AMBULATORY_CARE_PROVIDER_SITE_OTHER): Payer: Medicare HMO | Admitting: Clinical

## 2023-03-01 DIAGNOSIS — F900 Attention-deficit hyperactivity disorder, predominantly inattentive type: Secondary | ICD-10-CM | POA: Diagnosis not present

## 2023-03-01 DIAGNOSIS — F4310 Post-traumatic stress disorder, unspecified: Secondary | ICD-10-CM

## 2023-03-01 DIAGNOSIS — F331 Major depressive disorder, recurrent, moderate: Secondary | ICD-10-CM

## 2023-03-01 DIAGNOSIS — M1712 Unilateral primary osteoarthritis, left knee: Secondary | ICD-10-CM | POA: Diagnosis not present

## 2023-03-01 DIAGNOSIS — F419 Anxiety disorder, unspecified: Secondary | ICD-10-CM | POA: Diagnosis not present

## 2023-03-01 DIAGNOSIS — R69 Illness, unspecified: Secondary | ICD-10-CM | POA: Diagnosis not present
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
## 2023-03-12 ENCOUNTER — Encounter (HOSPITAL_COMMUNITY): Payer: Self-pay | Admitting: Clinical

## 2023-03-12 ENCOUNTER — Ambulatory Visit (INDEPENDENT_AMBULATORY_CARE_PROVIDER_SITE_OTHER): Payer: Medicare HMO | Admitting: Clinical

## 2023-03-12 DIAGNOSIS — F331 Major depressive disorder, recurrent, moderate: Secondary | ICD-10-CM | POA: Diagnosis not present

## 2023-03-12 DIAGNOSIS — F900 Attention-deficit hyperactivity disorder, predominantly inattentive type: Secondary | ICD-10-CM

## 2023-03-12 DIAGNOSIS — F640 Transsexualism: Secondary | ICD-10-CM | POA: Diagnosis not present

## 2023-03-12 DIAGNOSIS — F4310 Post-traumatic stress disorder, unspecified: Secondary | ICD-10-CM

## 2023-03-12 DIAGNOSIS — R69 Illness, unspecified: Secondary | ICD-10-CM | POA: Diagnosis not present
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
## 2023-03-22 ENCOUNTER — Encounter: Payer: Self-pay | Admitting: *Deleted

## 2023-03-22 DIAGNOSIS — M797 Fibromyalgia: Secondary | ICD-10-CM | POA: Diagnosis not present

## 2023-03-22 DIAGNOSIS — M0609 Rheumatoid arthritis without rheumatoid factor, multiple sites: Secondary | ICD-10-CM | POA: Diagnosis not present

## 2023-03-22 DIAGNOSIS — R7989 Other specified abnormal findings of blood chemistry: Secondary | ICD-10-CM | POA: Diagnosis not present

## 2023-03-22 DIAGNOSIS — Z6841 Body Mass Index (BMI) 40.0 and over, adult: Secondary | ICD-10-CM | POA: Diagnosis not present

## 2023-03-22 DIAGNOSIS — Z79899 Other long term (current) drug therapy: Secondary | ICD-10-CM | POA: Diagnosis not present

## 2023-03-22 DIAGNOSIS — M1991 Primary osteoarthritis, unspecified site: Secondary | ICD-10-CM | POA: Diagnosis not present

## 2023-04-02 ENCOUNTER — Ambulatory Visit (INDEPENDENT_AMBULATORY_CARE_PROVIDER_SITE_OTHER): Payer: Medicare HMO | Admitting: Clinical

## 2023-04-02 ENCOUNTER — Encounter (HOSPITAL_COMMUNITY): Payer: Self-pay | Admitting: Clinical

## 2023-04-02 DIAGNOSIS — F640 Transsexualism: Secondary | ICD-10-CM

## 2023-04-02 DIAGNOSIS — F4522 Body dysmorphic disorder: Secondary | ICD-10-CM | POA: Diagnosis not present

## 2023-04-02 DIAGNOSIS — F900 Attention-deficit hyperactivity disorder, predominantly inattentive type: Secondary | ICD-10-CM

## 2023-04-02 DIAGNOSIS — F4310 Post-traumatic stress disorder, unspecified: Secondary | ICD-10-CM | POA: Diagnosis not present

## 2023-04-02 DIAGNOSIS — F331 Major depressive disorder, recurrent, moderate: Secondary | ICD-10-CM | POA: Diagnosis not present

## 2023-04-02 DIAGNOSIS — R69 Illness, unspecified: Secondary | ICD-10-CM | POA: Diagnosis not present
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
## 2023-04-05 ENCOUNTER — Telehealth: Payer: Self-pay | Admitting: Family Medicine

## 2023-04-05 NOTE — Telephone Encounter (Signed)
Contacted Dawn Jordan to schedule their annual wellness visit. Appointment made for 04/08/23.  [name] AWV direct phone # [phone]

## 2023-04-08 ENCOUNTER — Encounter (INDEPENDENT_AMBULATORY_CARE_PROVIDER_SITE_OTHER): Payer: Medicare HMO | Admitting: Family Medicine

## 2023-04-08 NOTE — Progress Notes (Signed)
Pt requested to cancel per staff.

## 2023-04-12 ENCOUNTER — Encounter (HOSPITAL_COMMUNITY): Payer: Self-pay

## 2023-04-14 ENCOUNTER — Telehealth (HOSPITAL_COMMUNITY): Payer: Self-pay | Admitting: Clinical

## 2023-04-14 ENCOUNTER — Ambulatory Visit (HOSPITAL_COMMUNITY): Payer: Medicare HMO | Admitting: Clinical

## 2023-04-15 ENCOUNTER — Telehealth: Payer: Self-pay | Admitting: Cardiology

## 2023-04-15 DIAGNOSIS — R002 Palpitations: Secondary | ICD-10-CM

## 2023-04-15 NOTE — Telephone Encounter (Signed)
  Per MyChart Scheduling message:   Pt c/o of Chest Pain: STAT if CP now or developed within 24 hours  1. Are you having CP right now?   2. Are you experiencing any other symptoms (ex. SOB, nausea, vomiting, sweating)?   3. How long have you been experiencing CP?   4. Is your CP continuous or coming and going?   5. Have you taken Nitroglycerin?   Still having racing heart rate and pain when leaning or laying on left side. I would like to loose weight and become healthier, I need a safe exercise plan. And clearance for knee injections.   No No No Pain is typically when laying on left side for more than 3 minutes, heart rate goes up, then pain begins. Nitro yes / Clonazpam also as doctors thought symptoms could be panic attacks.   STAT if HR is under 50 or over 120 (normal HR is 60-100 beats per minute)  What is your heart rate?   Do you have a log of your heart rate readings (document readings)?   Do you have any other symptoms?     89, 136. 90 , 89, 91, 124, 103, 87 No

## 2023-04-15 NOTE — Telephone Encounter (Signed)
Spoke with pt who is calling concerned about heart palpitations mostly when she lays on her left side, which then leads to chest discomfort/pain.  Pt reports heart rate will increase during some activities such as bending over to pick up something or carrying grocery inside.  Episodes wake her from her sleep.  They can last from 30 mins to 1 hour.  Sometimes Nitroglycerine helps and sometimes clonazepam does.  She is still taking medications as listed including Metoprolol 25 mg daily, flecainide 50 mg BID and clonazepam 0.5 mg up to TID prn.  Pt has not checked her BP during these episodes.  Pt reports she is also awaiting cortisone injections into her knee but will require cardiac clearance for this.  She will have Dr Alusio's office fax over a request.  Pt is concerned since increased HR/palps seem to happen more when she lays on the left side, that a blood vessel may be pinched or constricted in her neck or shoulder in some way causing the palps.  She asks about there being any type of testing to look into this.  Advised I will have Dr Anne Fu to review and call back with any further comments or orders.  Pt's last heart monitor was completed in 06/2019.

## 2023-04-16 ENCOUNTER — Other Ambulatory Visit (HOSPITAL_COMMUNITY): Payer: Self-pay | Admitting: Psychiatry

## 2023-04-16 ENCOUNTER — Other Ambulatory Visit: Payer: Self-pay | Admitting: Family Medicine

## 2023-04-16 ENCOUNTER — Ambulatory Visit: Payer: Medicare HMO | Attending: Cardiology

## 2023-04-16 DIAGNOSIS — F4310 Post-traumatic stress disorder, unspecified: Secondary | ICD-10-CM

## 2023-04-16 DIAGNOSIS — R002 Palpitations: Secondary | ICD-10-CM

## 2023-04-16 DIAGNOSIS — K219 Gastro-esophageal reflux disease without esophagitis: Secondary | ICD-10-CM

## 2023-04-16 MED ORDER — METOPROLOL SUCCINATE ER 50 MG PO TB24: 50.0000 mg | ORAL_TABLET | Freq: Every day | ORAL | 3 refills | Status: DC

## 2023-04-16 NOTE — Telephone Encounter (Signed)
Pt is aware of orders.  RX for increase dose pf Toprol to be sent into [facility] requested.  Pt reports a previous reaction to the adhesive on the last monitor to wore.  Reviewed with monitor tech who advises to order preventice monitor.

## 2023-04-16 NOTE — Telephone Encounter (Signed)
[facility]io please. Increase Toprol to 50mg  Donato Schultz, MD

## 2023-04-16 NOTE — Progress Notes (Unsigned)
Preventice long Term 14 day monitor enrolled to be mailed to patients home. Sensitive skin requested.

## 2023-04-21 ENCOUNTER — Telehealth: Payer: Self-pay | Admitting: Cardiology

## 2023-04-21 NOTE — Telephone Encounter (Signed)
Returned call to patient according to [employer] shipping monitor should be delivered May 16th by 5:15

## 2023-04-21 NOTE — Telephone Encounter (Signed)
Pt calling for an update on monitor to see if its been mailed out yet

## 2023-04-22 ENCOUNTER — Encounter (HOSPITAL_COMMUNITY): Payer: Self-pay | Admitting: Psychiatry

## 2023-04-22 ENCOUNTER — Telehealth (HOSPITAL_BASED_OUTPATIENT_CLINIC_OR_DEPARTMENT_OTHER): Payer: Medicare HMO | Admitting: Psychiatry

## 2023-04-22 DIAGNOSIS — F331 Major depressive disorder, recurrent, moderate: Secondary | ICD-10-CM | POA: Diagnosis not present

## 2023-04-22 DIAGNOSIS — F5105 Insomnia due to other mental disorder: Secondary | ICD-10-CM | POA: Diagnosis not present

## 2023-04-22 DIAGNOSIS — R632 Polyphagia: Secondary | ICD-10-CM

## 2023-04-22 DIAGNOSIS — F4310 Post-traumatic stress disorder, unspecified: Secondary | ICD-10-CM

## 2023-04-22 DIAGNOSIS — F99 Mental disorder, not otherwise specified: Secondary | ICD-10-CM

## 2023-04-22 DIAGNOSIS — F900 Attention-deficit hyperactivity disorder, predominantly inattentive type: Secondary | ICD-10-CM | POA: Diagnosis not present

## 2023-04-22 MED ORDER — VILAZODONE HCL 40 MG PO TABS: 40.0000 mg | ORAL_TABLET | Freq: Every day | ORAL | 0 refills | Status: DC

## 2023-04-22 MED ORDER — ATOMOXETINE HCL 25 MG PO CAPS: ORAL_CAPSULE | ORAL | 0 refills | Status: DC

## 2023-04-22 MED ORDER — CLONAZEPAM 0.5 MG PO TABS: 0.5000 mg | ORAL_TABLET | Freq: Three times a day (TID) | ORAL | 1 refills | Status: AC | PRN

## 2023-04-22 MED ORDER — DIPHENHYDRAMINE HCL 25 MG PO CAPS: ORAL_CAPSULE | ORAL | 2 refills | Status: DC
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
## 2023-04-23 ENCOUNTER — Telehealth: Payer: Self-pay | Admitting: Cardiology

## 2023-04-23 DIAGNOSIS — R002 Palpitations: Secondary | ICD-10-CM | POA: Diagnosis not present

## 2023-04-23 NOTE — Telephone Encounter (Signed)
Pt instructed to place monitor at her convenience, but aware not to place if she is having any testing in the next couple weeks. Advised that once she mails (via [employer])  it back that it may be several weeks before we call with result findings. Patient verbalized understanding and agreeable to plan.

## 2023-04-23 NOTE — Telephone Encounter (Signed)
Patient states she received her heart monitor and she would like to know how soon she needs to apply it and what date she will need to remove it. Please advise.

## 2023-04-28 ENCOUNTER — Telehealth: Payer: Self-pay | Admitting: Cardiology

## 2023-04-28 ENCOUNTER — Encounter (HOSPITAL_COMMUNITY): Payer: Self-pay | Admitting: Clinical

## 2023-04-28 ENCOUNTER — Ambulatory Visit (INDEPENDENT_AMBULATORY_CARE_PROVIDER_SITE_OTHER): Payer: Medicare HMO | Admitting: Clinical

## 2023-04-28 DIAGNOSIS — F900 Attention-deficit hyperactivity disorder, predominantly inattentive type: Secondary | ICD-10-CM | POA: Diagnosis not present

## 2023-04-28 DIAGNOSIS — F4310 Post-traumatic stress disorder, unspecified: Secondary | ICD-10-CM | POA: Diagnosis not present

## 2023-04-28 DIAGNOSIS — F640 Transsexualism: Secondary | ICD-10-CM | POA: Diagnosis not present

## 2023-04-28 DIAGNOSIS — F331 Major depressive disorder, recurrent, moderate: Secondary | ICD-10-CM

## 2023-04-28 NOTE — Telephone Encounter (Signed)
Pt states she red and blistering on her skin where her monitor is placed. She stated she has two adhesives on but they are still bothering her. She states she has one more adhesive and not sure if she should put it on in a different place or not put it back on at all. Please advise.
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
## 2023-04-29 NOTE — Telephone Encounter (Signed)
Additional Acrylic and Hydrocolloid Preventice electrode strips left at front desk for patient.

## 2023-05-06 ENCOUNTER — Encounter: Payer: Self-pay | Admitting: Cardiology

## 2023-05-12 ENCOUNTER — Encounter (HOSPITAL_COMMUNITY): Payer: Self-pay | Admitting: Clinical

## 2023-05-12 ENCOUNTER — Ambulatory Visit (INDEPENDENT_AMBULATORY_CARE_PROVIDER_SITE_OTHER): Payer: Medicare HMO | Admitting: Clinical

## 2023-05-12 DIAGNOSIS — F4310 Post-traumatic stress disorder, unspecified: Secondary | ICD-10-CM

## 2023-05-12 DIAGNOSIS — F640 Transsexualism: Secondary | ICD-10-CM

## 2023-05-12 DIAGNOSIS — F331 Major depressive disorder, recurrent, moderate: Secondary | ICD-10-CM
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
## 2023-05-31 ENCOUNTER — Ambulatory Visit (INDEPENDENT_AMBULATORY_CARE_PROVIDER_SITE_OTHER): Payer: Medicare HMO

## 2023-05-31 VITALS — Ht 65.0 in | Wt 248.0 lb

## 2023-05-31 DIAGNOSIS — Z0000 Encounter for general adult medical examination without abnormal findings: Secondary | ICD-10-CM

## 2023-05-31 NOTE — Patient Instructions (Addendum)
Dawn Jordan , Thank you for taking time to come for your Medicare Wellness Visit. I appreciate your ongoing commitment to your health goals. Please review the following plan we discussed and let me know if I can assist you in the future.   These are the goals we discussed:  Goals       Exercise 3x per week (30 min per time)      Lose weight (pt-stated)      Weight (lb) < 200 lb (90.7 kg)        This is a list of the screening recommended for you and due dates:  Health Maintenance  Topic Date Due   Pap Smear  Never done   Mammogram  04/14/2020   COVID-19 Vaccine (1) 06/16/2023*   Zoster (Shingles) Vaccine (1 of 2) 08/31/2023*   Flu Shot  07/08/2023   Medicare Annual Wellness Visit  05/30/2024   DTaP/Tdap/Td vaccine (2 - Tdap) 12/07/2025   Colon Cancer Screening  08/05/2027   Hepatitis C Screening  Completed   HIV Screening  Completed   HPV Vaccine  Aged Out  *Topic was postponed. The date shown is not the original due date.    Advanced directives: Advance directive discussed with you today. Even though you declined this today, please call our office should you change your mind, and we can give you the proper paperwork for you to fill out.   Conditions/risks identified: None  Next appointment: Follow up in one year for your annual wellness visit.   Preventive Care 40-64 Years, Female Preventive care refers to lifestyle choices and visits with your health care provider that can promote health and wellness. What does preventive care include? A yearly physical exam. This is also called an annual well check. Dental exams once or twice a year. Routine eye exams. Ask your health care provider how often you should have your eyes checked. Personal lifestyle choices, including: Daily care of your teeth and gums. Regular physical activity. Eating a healthy diet. Avoiding tobacco and drug use. Limiting alcohol use. Practicing safe sex. Taking low-dose aspirin daily starting at age  32. Taking vitamin and mineral supplements as recommended by your health care provider. What happens during an annual well check? The services and screenings done by your health care provider during your annual well check will depend on your age, overall health, lifestyle risk factors, and family history of disease. Counseling  Your health care provider may ask you questions about your: Alcohol use. Tobacco use. Drug use. Emotional well-being. Home and relationship well-being. Sexual activity. Eating habits. Work and work Astronomer. Method of birth control. Menstrual cycle. Pregnancy history. Screening  You may have the following tests or measurements: Height, weight, and BMI. Blood pressure. Lipid and cholesterol levels. These may be checked every 5 years, or more frequently if you are over 26 years old. Skin check. Lung cancer screening. You may have this screening every year starting at age 4 if you have a 30-pack-year history of smoking and currently smoke or have quit within the past 15 years. Fecal occult blood test (FOBT) of the stool. You may have this test every year starting at age 19. Flexible sigmoidoscopy or colonoscopy. You may have a sigmoidoscopy every 5 years or a colonoscopy every 10 years starting at age 34. Hepatitis C blood test. Hepatitis B blood test. Sexually transmitted disease (STD) testing. Diabetes screening. This is done by checking your blood sugar (glucose) after you have not eaten for a while (fasting). You may  have this done every 1-3 years. Mammogram. This may be done every 1-2 years. Talk to your health care provider about when you should start having regular mammograms. This may depend on whether you have a family history of breast cancer. BRCA-related cancer screening. This may be done if you have a family history of breast, ovarian, tubal, or peritoneal cancers. Pelvic exam and Pap test. This may be done every 3 years starting at age 55.  Starting at age 22, this may be done every 5 years if you have a Pap test in combination with an HPV test. Bone density scan. This is done to screen for osteoporosis. You may have this scan if you are at high risk for osteoporosis. Discuss your test results, treatment options, and if necessary, the need for more tests with your health care provider. Vaccines  Your health care provider may recommend certain vaccines, such as: Influenza vaccine. This is recommended every year. Tetanus, diphtheria, and acellular pertussis (Tdap, Td) vaccine. You may need a Td booster every 10 years. Zoster vaccine. You may need this after age 34. Pneumococcal 13-valent conjugate (PCV13) vaccine. You may need this if you have certain conditions and were not previously vaccinated. Pneumococcal polysaccharide (PPSV23) vaccine. You may need one or two doses if you smoke cigarettes or if you have certain conditions. Talk to your health care provider about which screenings and vaccines you need and how often you need them. This information is not intended to replace advice given to you by your health care provider. Make sure you discuss any questions you have with your health care provider. Document Released: 12/20/2015 Document Revised: 08/12/2016 Document Reviewed: 09/24/2015 Elsevier Interactive Patient Education  2017 [place] Prevention in the Home Falls can cause injuries. They can happen to people of all ages. There are many things you can do to make your home safe and to help prevent falls. What can I do on the outside of my home? Regularly fix the edges of walkways and driveways and fix any cracks. Remove anything that might make you trip as you walk through a door, such as a raised step or threshold. Trim any bushes or trees on the path to your home. Use bright outdoor lighting. Clear any walking paths of anything that might make someone trip, such as rocks or tools. Regularly check to see if  handrails are loose or broken. Make sure that both sides of any steps have handrails. Any raised decks and porches should have guardrails on the edges. Have any leaves, snow, or ice cleared regularly. Use sand or salt on walking paths during winter. Clean up any spills in your garage right away. This includes oil or grease spills. What can I do in the bathroom? Use night lights. Install grab bars by the toilet and in the tub and shower. Do not use towel bars as grab bars. Use non-skid mats or decals in the tub or shower. If you need to sit down in the shower, use a plastic, non-slip stool. Keep the floor dry. Clean up any water that spills on the floor as soon as it happens. Remove soap buildup in the tub or shower regularly. Attach bath mats securely with double-sided non-slip rug tape. Do not have throw rugs and other things on the floor that can make you trip. What can I do in the bedroom? Use night lights. Make sure that you have a light by your bed that is easy to reach. Do not  use any sheets or blankets that are too big for your bed. They should not hang down onto the floor. Have a firm chair that has side arms. You can use this for support while you get dressed. Do not have throw rugs and other things on the floor that can make you trip. What can I do in the kitchen? Clean up any spills right away. Avoid walking on wet floors. Keep items that you use a lot in easy-to-reach places. If you need to reach something above you, use a strong step stool that has a grab bar. Keep electrical cords out of the way. Do not use floor polish or wax that makes floors slippery. If you must use wax, use non-skid floor wax. Do not have throw rugs and other things on the floor that can make you trip. What can I do with my stairs? Do not leave any items on the stairs. Make sure that there are handrails on both sides of the stairs and use them. Fix handrails that are broken or loose. Make sure that  handrails are as long as the stairways. Check any carpeting to make sure that it is firmly attached to the stairs. Fix any carpet that is loose or worn. Avoid having throw rugs at the top or bottom of the stairs. If you do have throw rugs, attach them to the floor with carpet tape. Make sure that you have a light switch at the top of the stairs and the bottom of the stairs. If you do not have them, ask someone to add them for you. What else can I do to help prevent falls? Wear shoes that: Do not have high heels. Have rubber bottoms. Are comfortable and fit you well. Are closed at the toe. Do not wear sandals. If you use a stepladder: Make sure that it is fully opened. Do not climb a closed stepladder. Make sure that both sides of the stepladder are locked into place. Ask someone to hold it for you, if possible. Clearly mark and make sure that you can see: Any grab bars or handrails. First and last steps. Where the edge of each step is. Use tools that help you move around (mobility aids) if they are needed. These include: Canes. Walkers. Scooters. Crutches. Turn on the lights when you go into a dark area. Replace any light bulbs as soon as they burn out. Set up your furniture so you have a clear path. Avoid moving your furniture around. If any of your floors are uneven, fix them. If there are any pets around you, be aware of where they are. Review your medicines with your doctor. Some medicines can make you feel dizzy. This can increase your chance of falling. Ask your doctor what other things that you can do to help prevent falls. This information is not intended to replace advice given to you by your health care provider. Make sure you discuss any questions you have with your health care provider. Document Released: 09/19/2009 Document Revised: 04/30/2016 Document Reviewed: 12/28/2014 Elsevier Interactive Patient Education  2017 [employer].

## 2023-05-31 NOTE — Progress Notes (Signed)
Subjective:   Dawn Jordan is a 57 y.o. female who presents for Medicare Annual/Subsequent preventive examination.  Visit Complete: Virtual  I connected with  [name] on 05/31/23 by a audio enabled telemedicine application and verified that I am speaking with the correct person using two identifiers.  Patient Location: Home  Provider Location: Home Office  I discussed the limitations of evaluation and management by telemedicine. The patient expressed understanding and agreed to proceed.  Patient Medicare AWV questionnaire was completed by the patient on ; I have confirmed that all information answered by patient is correct and no changes since this date.  Review of Systems     Cardiac Risk Factors include: advanced age (>16men, >22 women)     Objective:    Today's Vitals   05/31/23 1404  Weight: 248 lb (112.5 kg)  Height: 5\' 5"  (1.651 m)   Body mass index is 41.27 kg/m.     05/31/2023    2:12 PM 11/16/2022    3:28 PM 02/15/2022    2:00 PM 11/13/2021    3:05 PM 12/26/2020    1:36 PM 10/21/2019    9:09 PM 10/30/2018    3:34 PM  Advanced Directives  Does Patient Have a Medical Advance Directive? No No No No No No No  Would patient like information on creating a medical advance directive? No - Patient declined No - Patient declined Yes (ED - Information included in AVS)  No - Patient declined No - Patient declined No - Patient declined    Current Medications (verified) Outpatient Encounter Medications as of 05/31/2023  Medication Sig   acetaminophen (TYLENOL) 650 MG CR tablet Take 1,300 mg by mouth every 8 (eight) hours as needed for pain.   ACTEMRA 162 MG/0.9ML SOSY Inject 162 mg as directed every Tuesday.   atomoxetine (STRATTERA) 25 MG capsule Take 1 capsule (25 mg total) by mouth daily for 14 days, THEN 2 capsules (50 mg total) daily.   clonazePAM (KLONOPIN) 0.5 MG tablet Take 1 tablet (0.5 mg total) by mouth 3 (three) times daily as needed for anxiety.    diphenhydrAMINE (BENADRYL ALLERGY) 25 mg capsule Take 25-50mg  po qHS prn insomnia   EPINEPHrine 0.3 mg/0.3 mL IJ SOAJ injection Use for life threatening allergic reactions (Patient not taking: Reported on 04/22/2023)   flecainide (TAMBOCOR) 50 MG tablet TAKE ONE TABLET BY MOUTH TWICE DAILY   fluticasone (FLONASE) 50 MCG/ACT nasal spray PLACE ONE SPRAY IN EACH NOSTRIL EVERY DAY   fluticasone (FLOVENT HFA) 220 MCG/ACT inhaler INHALE TWO PUFFS INTO THE LUNGS EVERY MORNING & AT BEDTIME   levalbuterol (XOPENEX HFA) 45 MCG/ACT inhaler INHALE TWO PUFFS INTO THE LUNGS EVERY 6 HOURS AS NEEDED FOR WHEEZING   levocetirizine (XYZAL) 5 MG tablet Take 1 tablet (5 mg total) by mouth every evening.   metoprolol succinate (TOPROL-XL) 50 MG 24 hr tablet Take 1 tablet (50 mg total) by mouth daily. Take with or immediately following a meal.   montelukast (SINGULAIR) 10 MG tablet TAKE ONE TABLET BY MOUTH EVERY DAY   nitroGLYCERIN (NITROSTAT) 0.4 MG SL tablet Place 1 tablet (0.4 mg total) under the tongue every 5 (five) minutes as needed for chest pain. (Patient not taking: Reported on 04/22/2023)   Olopatadine HCl 0.2 % SOLN Place 1 drop into both eyes daily as needed.   Oxycodone HCl 10 MG TABS Take 10 mg by mouth daily as needed (pain).   pantoprazole (PROTONIX) 40 MG tablet Take 1 tablet (40 mg total) by  mouth daily.   Polyvinyl Alcohol-Povidone (REFRESH OP) Place 1 drop into both eyes daily.   pregabalin (LYRICA) 200 MG capsule Take 200 mg by mouth at bedtime.   triamcinolone ointment (KENALOG) 0.1 % Apply 1 application topically 2 (two) times daily. (Patient taking differently: Apply 1 application  topically 2 (two) times daily as needed (eczema).)   Vilazodone HCl (VIIBRYD) 40 MG TABS Take 1 tablet (40 mg total) by mouth daily.   Facility-Administered Encounter Medications as of 05/31/2023  Medication   0.9 %  sodium chloride infusion    Allergies (verified) Almond (diagnostic), Egg-derived products, Latex,  Other, Date seed extract [zizyphus jujuba], and Codeine   History: Past Medical History:  Diagnosis Date   A-fib (HCC)    ADD (attention deficit disorder)    Anemia    Anginal pain (HCC)    Anxiety    Asthma    Back pain    Bone spur of foot    Chest pain    Chronic fatigue syndrome    Complication of anesthesia    woke up during sugery once, and also with epidural at [facility] [address] had episode with epidural   Depression    Dyspnea    Dysrhythmia    AFIB   Fibromyalgia    GERD (gastroesophageal reflux disease)    Headache    History of gout    History of kidney stones    Hypertension    IBS (irritable bowel syndrome)    Joint pain    Left ankle pain    Left sciatic nerve pain    Myofascial pain    Neck pain    OSA (obstructive sleep apnea)    cpap   Palpitations    Rheumatoid arthritis (HCC)    Seasonal allergies    Past Surgical History:  Procedure Laterality Date   BILATERAL KNEE ARTHROSCOPY     CARPAL TUNNEL RELEASE Left    CYST EXCISION     "little cysts taken off both hands and left arm" (06/04/2017)   KNEE ARTHROSCOPY Bilateral    "3 on my left; 2 on my right" (06/04/2017)   KNEE SURGERY     LAPAROSCOPIC CHOLECYSTECTOMY     TONSILLECTOMY     TOTAL KNEE ARTHROPLASTY Right 11/24/2021   Procedure: TOTAL KNEE ARTHROPLASTY;  Surgeon: Ollen Gross, MD;  Location: [facility];  Service: Orthopedics;  Laterality: Right;   Family History  Problem Relation Age of Onset   Arthritis Mother    Hyperlipidemia Mother    Hypertension Mother    Stroke Mother    Mental retardation Mother    Diabetes Mother    Allergic rhinitis Mother    Heart disease Mother    Thyroid disease Mother    Depression Mother    Anxiety disorder Mother    Bipolar disorder Mother    Sleep apnea Mother    Eating disorder Mother    Diabetes Maternal Grandfather    Hypertension Maternal Grandfather    Diabetes Paternal Grandmother    Hypertension Paternal Grandmother     Cancer Paternal Grandmother        breast   Allergic rhinitis Brother    Colon cancer Paternal Aunt    Lung cancer Other    Lung cancer Maternal Uncle    Asthma Neg Hx    Eczema Neg Hx    Immunodeficiency Neg Hx    Urticaria Neg Hx    Atopy Neg Hx    Angioedema Neg Hx  Esophageal cancer Neg Hx    Stomach cancer Neg Hx    Rectal cancer Neg Hx    Social History   Socioeconomic History   Marital status: Married    Spouse name: Quiera Diffee   Number of children: 0   Years of education: Not on file   Highest education level: Not on file  Occupational History   Occupation: not employed-disabled  Tobacco Use   Smoking status: Never   Smokeless tobacco: Never  Vaping Use   Vaping Use: Former  Substance and Sexual Activity   Alcohol use: No   Drug use: No   Sexual activity: Never  Other Topics Concern   Not on file  Social History Narrative   Born in [place], [place]   Unemployed, on disability since 1991 for anxiety,    Education: [employer]   Single, no children, lives with her friend (used to live with her mother with stroke, now in ALF)      Social Determinants of Health   Financial Resource Strain: Low Risk  (05/31/2023)   Overall Financial Resource Strain (CARDIA)    Difficulty of Paying Living Expenses: Not hard at all  Food Insecurity: No Food Insecurity (05/31/2023)   Hunger Vital Sign    Worried About Running Out of Food in the Last Year: Never true    Ran Out of Food in the Last Year: Never true  Transportation Needs: No Transportation Needs (05/31/2023)   PRAPARE - Administrator, Civil Service (Medical): No    Lack of Transportation (Non-Medical): No  Physical Activity: Inactive (05/31/2023)   Exercise Vital Sign    Days of Exercise per Week: 0 days    Minutes of Exercise per Session: 0 min  Stress: No Stress Concern Present (05/31/2023)   [employer] of Occupational Health - Occupational Stress  Questionnaire    Feeling of Stress : Not at all  Social Connections: Socially Integrated (05/31/2023)   Social Connection and Isolation Panel [NHANES]    Frequency of Communication with Friends and Family: More than three times a week    Frequency of Social Gatherings with Friends and Family: More than three times a week    Attends Religious Services: More than 4 times per year    Active Member of [employer] or Organizations: Yes    Attends Engineer, structural: More than 4 times per year    Marital Status: Married    Tobacco Counseling Counseling given: Not Answered   Clinical Intake:  Pre-visit preparation completed: No  Pain : No/denies pain     BMI - recorded: 41.27 Nutritional Status: BMI > 30  Obese Nutritional Risks: None Diabetes: No  How often do you need to have someone help you when you read instructions, pamphlets, or other written materials from your doctor or pharmacy?: 1 - Never  Interpreter Needed?: No  Information entered by :: Theresa Mulligan LPN   Activities of Daily Living    05/31/2023    2:10 PM  In your present state of health, do you have any difficulty performing the following activities:  Hearing? 0  Vision? 0  Difficulty concentrating or making decisions? 0  Walking or climbing stairs? 1  Comment Orthopedic issues. Followed by Orthopedic.  Dressing or bathing? 0  Doing errands, shopping? 0  Preparing Food and eating ? N  Using the Toilet? N  In the past six months, have you accidently leaked urine? N  Do you have problems with  loss of bowel control? N  Managing your Medications? N  Managing your Finances? N  Housekeeping or managing your Housekeeping? N    Patient Care Team: [place], Betty G, MD as PCP - General (Family Medicine) Jake Bathe, MD as PCP - Cardiology (Cardiology) Ollen Gross, MD as Consulting Physician (Orthopedic Surgery)  Indicate any recent Medical Services you may have received from other than [facility]  providers in the past year (date may be approximate).     Assessment:   This is a routine wellness examination for Dawn Jordan.  Hearing/Vision screen Hearing Screening - Comments:: Denies hearing difficulties   Vision Screening - Comments:: Wears rx glasses - up to date with routine eye exams with  Dr Clelia Croft  Dietary issues and exercise activities discussed:     Goals Addressed               This Visit's Progress     Lose weight (pt-stated)         Depression Screen    05/31/2023    2:10 PM 04/22/2023    9:17 AM 01/07/2023    9:21 AM 12/15/2022    9:54 AM 12/15/2022    9:02 AM 12/15/2022    9:01 AM 11/04/2022    4:13 PM  PHQ 2/9 Scores  PHQ - 2 Score 0   1 0 0 0  PHQ- 9 Score    7 0 0 0     Information is confidential and restricted. Go to Review Flowsheets to unlock data.    Fall Risk    05/31/2023    2:12 PM 12/15/2022    9:01 AM 12/29/2021    1:00 PM 12/26/2020    1:39 PM 09/18/2019   11:43 AM  Fall Risk   Falls in the past year? 0 0 0 0 1  Number falls in past yr: 0 0 0 0 1  Comment     no recent fall  Injury with Fall? 0 0 0 0 0  Risk for fall due to : No Fall Risks No Fall Risks  No Fall Risks History of fall(s);Impaired balance/gait  Follow up Falls prevention discussed Falls evaluation completed  Falls evaluation completed;Falls prevention discussed     MEDICARE RISK AT HOME:  Medicare Risk at Home - 05/31/23 1420     Any stairs in or around the home? No    If so, are there any without handrails? No    Home free of loose throw rugs in walkways, pet beds, electrical cords, etc? Yes    Adequate lighting in your home to reduce risk of falls? Yes    Life alert? No    Use of a cane, walker or w/c? No    Grab bars in the bathroom? No    Shower chair or bench in shower? Yes    Elevated toilet seat or a handicapped toilet? Yes             TIMED UP AND GO:  Was the test performed?  No    Cognitive Function:        05/31/2023    2:13 PM 12/29/2021    1:04  PM  6CIT Screen  What Year? 0 points 0 points  What month? 0 points 0 points  What time? 0 points 0 points  Count back from 20 0 points 4 points  Months in reverse 4 points   Repeat phrase 0 points   Total Score 4 points     Immunizations Immunization History  Administered Date(s) Administered   Td 12/08/2015    TDAP status: Up to date  Flu Vaccine status: Declined, Education has been provided regarding the importance of this vaccine but patient still declined. Advised may receive this vaccine at local pharmacy or Health Dept. Aware to provide a copy of the vaccination record if obtained from local pharmacy or Health Dept. Verbalized acceptance and understanding.    Covid-19 vaccine status: Declined, Education has been provided regarding the importance of this vaccine but patient still declined. Advised may receive this vaccine at local pharmacy or Health Dept.or vaccine clinic. Aware to provide a copy of the vaccination record if obtained from local pharmacy or Health Dept. Verbalized acceptance and understanding.  Qualifies for Shingles Vaccine? Yes   Zostavax completed No   Shingrix Completed?: No.    Education has been provided regarding the importance of this vaccine. Patient has been advised to call insurance company to determine out of pocket expense if they have not yet received this vaccine. Advised may also receive vaccine at local pharmacy or Health Dept. Verbalized acceptance and understanding.  Screening Tests Health Maintenance  Topic Date Due   PAP SMEAR-Modifier  Never done   MAMMOGRAM  04/14/2020   COVID-19 Vaccine (1) 06/16/2023 (Originally 11/27/1971)   Zoster Vaccines- Shingrix (1 of 2) 08/31/2023 (Originally 11/27/1984)   INFLUENZA VACCINE  07/08/2023   Medicare Annual Wellness (AWV)  05/30/2024   DTaP/Tdap/Td (2 - Tdap) 12/07/2025   Colonoscopy  08/05/2027   Hepatitis C Screening  Completed   HIV Screening  Completed   HPV VACCINES  Aged Out     Health Maintenance  Health Maintenance Due  Topic Date Due   PAP SMEAR-Modifier  Never done   MAMMOGRAM  04/14/2020    Colorectal cancer screening: Type of screening: Colonoscopy. Completed 08/04/17. Repeat every 10 years  Lung Cancer Screening: (Low Dose CT Chest recommended if Age 86-80 years, 20 pack-year currently smoking OR have quit w/in 15years.) does not qualify.     Additional Screening:  Hepatitis C Screening: does qualify; Completed 04/16/20  Vision Screening: Recommended annual ophthalmology exams for early detection of glaucoma and other disorders of the eye. Is the patient up to date with their annual eye exam?  Yes  Who is the provider or what is the name of the office in which the patient attends annual eye exams? Dr Clelia Croft If pt is not established with a provider, would they like to be referred to a provider to establish care? No .   Dental Screening: Recommended annual dental exams for proper oral hygiene  Community Resource Referral / Chronic Care Management:  CRR required this visit?  No   CCM required this visit?  No     Plan:     I have personally reviewed and noted the following in the patient's chart:   Medical and social history Use of alcohol, tobacco or illicit drugs  Current medications and supplements including opioid prescriptions. Patient is not currently taking opioid prescriptions. Functional ability and status Nutritional status Physical activity Advanced directives List of other physicians Hospitalizations, surgeries, and ER visits in previous 12 months Vitals Screenings to include cognitive, depression, and falls Referrals and appointments  In addition, I have reviewed and discussed with patient certain preventive protocols, quality metrics, and best practice recommendations. A written personalized care plan for preventive services as well as general preventive health recommendations were provided to patient.     Tillie Rung, LPN   01/06/8656  After Visit Summary: (MyChart) Due to this being a telephonic visit, the after visit summary with patients personalized plan was offered to patient via MyChart   Nurse Notes: None

## 2023-06-02 ENCOUNTER — Ambulatory Visit (INDEPENDENT_AMBULATORY_CARE_PROVIDER_SITE_OTHER): Payer: Medicare HMO | Admitting: Clinical

## 2023-06-02 ENCOUNTER — Encounter (HOSPITAL_COMMUNITY): Payer: Self-pay | Admitting: Clinical

## 2023-06-02 DIAGNOSIS — F4522 Body dysmorphic disorder: Secondary | ICD-10-CM | POA: Diagnosis not present

## 2023-06-02 DIAGNOSIS — F331 Major depressive disorder, recurrent, moderate: Secondary | ICD-10-CM

## 2023-06-02 DIAGNOSIS — F900 Attention-deficit hyperactivity disorder, predominantly inattentive type: Secondary | ICD-10-CM

## 2023-06-02 DIAGNOSIS — F4310 Post-traumatic stress disorder, unspecified: Secondary | ICD-10-CM | POA: Diagnosis not present

## 2023-06-02 DIAGNOSIS — H5203 Hypermetropia, bilateral: Secondary | ICD-10-CM | POA: Diagnosis not present
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
## 2023-06-03 ENCOUNTER — Telehealth (HOSPITAL_BASED_OUTPATIENT_CLINIC_OR_DEPARTMENT_OTHER): Payer: Medicare HMO | Admitting: Psychiatry

## 2023-06-03 DIAGNOSIS — F5105 Insomnia due to other mental disorder: Secondary | ICD-10-CM

## 2023-06-03 DIAGNOSIS — F331 Major depressive disorder, recurrent, moderate: Secondary | ICD-10-CM | POA: Diagnosis not present

## 2023-06-03 DIAGNOSIS — F900 Attention-deficit hyperactivity disorder, predominantly inattentive type: Secondary | ICD-10-CM | POA: Diagnosis not present

## 2023-06-03 DIAGNOSIS — F99 Mental disorder, not otherwise specified: Secondary | ICD-10-CM | POA: Diagnosis not present

## 2023-06-03 DIAGNOSIS — R632 Polyphagia: Secondary | ICD-10-CM | POA: Diagnosis not present

## 2023-06-03 DIAGNOSIS — F4310 Post-traumatic stress disorder, unspecified: Secondary | ICD-10-CM

## 2023-06-03 MED ORDER — ATOMOXETINE HCL 60 MG PO CAPS
60.0000 mg | ORAL_CAPSULE | Freq: Every day | ORAL | 0 refills | Status: DC
Start: 2023-06-03 — End: 2023-09-27

## 2023-06-03 MED ORDER — VILAZODONE HCL 40 MG PO TABS: 40.0000 mg | ORAL_TABLET | Freq: Every day | ORAL | 0 refills | Status: DC

## 2023-06-03 NOTE — Progress Notes (Signed)
Virtual Visit via Video Note  I connected with Dawn Jordan on 06/03/23 at  9:15 AM EDT by a video enabled telemedicine application and verified that I am speaking with the correct person using two identifiers.  Location: Patient: home Provider: office   I discussed the limitations of evaluation and management by telemedicine and the availability of in person appointments. The patient expressed understanding and agreed to proceed.  History of Present Illness: "I am doing all right". Dawn Jordan shares her depression feels a little better. Her wife got a job and that has helped with the their financial problems but she is in [place]. They plan to move to [facility] in 3 months. In the last 2 weeks she has felt depressed and hopeless for 4 days. Dawn Jordan denies SI/HI. She doesn't lie down until midnight and but it can take hours to fall asleep. Dawn Jordan rarely takes Benadryl because she wants to be able to her hear mom during the night. She has been having some nightmares and intrusive memories.  Dawn Jordan continues to struggle with her focus on a day to day basis. She often finds herself talking continuously. Dawn Jordan is no longer binge eating. Her anxiety comes on randomly and it can overwhelm her. She takes Klonopin 1-2x/month and is fearful of becoming addicted. She tries to use coping skills first.  Dawn Jordan feels her meds are effective and denies SE.     Observations/Objective: Psychiatric Specialty Exam: ROS  There were no vitals taken for this visit.There is no height or weight on file to calculate BMI.  General Appearance: Casual and Neat  Eye Contact:  Good  Speech:  Clear and Coherent and Normal Rate  Volume:  Normal  Mood:  Euthymic  Affect:  Blunt  Thought Process:  Coherent and Descriptions of Associations: Circumstantial  Orientation:  Full (Time, Place, and Person)  Thought Content:  Rumination  Suicidal Thoughts:  No  Homicidal Thoughts:  No  Memory:  Immediate;   Good  Judgement:  Good  Insight:   Good  Psychomotor Activity:  Normal  Concentration:  Concentration: Good  Recall:  Good  Fund of Knowledge:  Good  Language:  Good  Akathisia:  No  Handed:  Right  AIMS (if indicated):     Assets:  Communication Skills Desire for Improvement Financial Resources/Insurance Housing Intimacy Resilience Social Support Talents/Skills Transportation Vocational/Educational  ADL's:  Intact  Cognition:  WNL  Sleep:        Assessment and Plan:     06/03/2023    9:22 AM 05/31/2023    2:10 PM 04/22/2023    9:17 AM 01/07/2023    9:21 AM 12/15/2022    9:54 AM  Depression screen PHQ 2/9  Decreased Interest 1 0 2 0 0  Down, Depressed, Hopeless 1 0 3 2 1   PHQ - 2 Score 2 0 5 2 1   Altered sleeping 3  3 3 2   Tired, decreased energy 3  3 1  0  Change in appetite 0  1 3 3   Feeling bad or failure about yourself  3  3 3  0  Trouble concentrating 3  3 3 1   Moving slowly or fidgety/restless 0  0 3 0  Suicidal thoughts 0  0 0 0  PHQ-9 Score 14  18 18 7   Difficult doing work/chores Very difficult  Very difficult Somewhat difficult Not difficult at all    Flowsheet Row Video Visit [facility]/03/20[facility] in [facility] Video Visit [facility]2023 [facility] Video Visit from 02/18/2023 in [facility]  C-SSRS RISK CATEGORY No Risk No Risk No Risk          Pt is aware that these meds carry a teratogenic risk. Pt will discuss plan of action if she does or plans to become pregnant in the future.  Status of current problems: improvement in depression, ongoing anxiety, PTSD, insomnia and ADHD symptoms   Medication management with supportive therapy. Risks and benefits, side effects and alternative treatment options discussed with patient. Pt was given an opportunity to ask questions about medication, illness, and treatment. All current psychiatric medications have been reviewed and  discussed with the patient and adjusted as clinically appropriate.  Pt verbalized understanding and verbal consent obtained for treatment.  Meds: no refill on Klonopin today as she has enough Increase Straterra 60mg  po qD 1. Attention deficit hyperactivity disorder (ADHD), predominantly inattentive type - atomoxetine (STRATTERA) 60 MG capsule; Take 1 capsule (60 mg total) by mouth daily.  Dispense: 90 capsule; Refill: 0  2. Insomnia due to other mental disorder  3. PTSD (post-traumatic stress disorder) - Vilazodone HCl (VIIBRYD) 40 MG TABS; Take 1 tablet (40 mg total) by mouth daily.  Dispense: 90 tablet; Refill: 0  4. MDD (major depressive disorder), recurrent episode, moderate (HCC) - Vilazodone HCl (VIIBRYD) 40 MG TABS; Take 1 tablet (40 mg total) by mouth daily.  Dispense: 90 tablet; Refill: 0  5. Binge eating - Vilazodone HCl (VIIBRYD) 40 MG TABS; Take 1 tablet (40 mg total) by mouth daily.  Dispense: 90 tablet; Refill: 0     Labs: none    Therapy: brief supportive therapy provided. Discussed psychosocial stressors in detail.     Collaboration of Care: Other encouraged to continue therapy  Patient/Guardian was advised Release of Information must be obtained prior to any record release in order to collaborate their care with an outside provider. Patient/Guardian was advised if they have not already done so to contact the registration department to sign all necessary forms in order for [name] to release information regarding their care.   Consent: Patient/Guardian gives verbal consent for treatment and assignment of benefits for services provided during this visit. Patient/Guardian expressed understanding and agreed to proceed.       Follow Up Instructions: Follow up in 2-3 months or sooner if needed with a new psychiatrist  Patient informed that I am leaving [facility] in 06/2023 and I relayed that they will be getting a new provider after that. Patient verbalized understanding and  agreed with the plan.     I discussed the assessment and treatment plan with the patient. The patient was provided an opportunity to ask questions and all were answered. The patient agreed with the plan and demonstrated an understanding of the instructions.   The patient was advised to call back or seek an in-person evaluation if the symptoms worsen or if the condition fails to improve as anticipated.  I provided 19 minutes of non-face-to-face time during this encounter.   Oletta Darter, MD

## 2023-06-07 ENCOUNTER — Telehealth (HOSPITAL_COMMUNITY): Payer: Self-pay | Admitting: Psychiatry

## 2023-06-11 ENCOUNTER — Other Ambulatory Visit: Payer: Self-pay | Admitting: Family Medicine

## 2023-06-11 ENCOUNTER — Other Ambulatory Visit: Payer: Self-pay | Admitting: Cardiology

## 2023-06-14 NOTE — Telephone Encounter (Signed)
Pt's montelukast/ singulair denied at [facility] [place] he has not been seen since 11/14/2021

## 2023-06-15 ENCOUNTER — Encounter: Payer: Self-pay | Admitting: Cardiology

## 2023-06-15 DIAGNOSIS — I480 Paroxysmal atrial fibrillation: Secondary | ICD-10-CM

## 2023-06-16 NOTE — Telephone Encounter (Signed)
Please see the MyChart message reply(ies) for my assessment and plan.    Sure. We can try to slowly decrease the medications and see how you do.  No atrial fibrillation was seen on the heart monitor.  First let's stop the flecainide 50mg  by going to once a day for a week then stopping.  After that, decrease the Toprol to 25mg  once a day for a week then stop.  Let's see how you do with this.  If atrial fibrillation returns, options include restarting the meds, or talking to our EP (electrician cardiologists) about ablation.  Let me know in a month with a message how you are doing.    This patient gave consent for this Medical Advice Message and is aware that it may result in a bill to [employer], as well as the possibility of receiving a bill for a co-payment or deductible. They are an established patient, but are not seeking medical advice exclusively about a problem treated during an in person or video visit in the last seven days. I did not recommend an in person or video visit within seven days of my reply.    I spent a total of 7 minutes cumulative time within 7 days through [employer].  Donato Schultz, MD

## 2023-06-17 ENCOUNTER — Encounter (HOSPITAL_COMMUNITY): Payer: Self-pay | Admitting: Clinical

## 2023-06-17 ENCOUNTER — Ambulatory Visit (INDEPENDENT_AMBULATORY_CARE_PROVIDER_SITE_OTHER): Payer: Medicare HMO | Admitting: Clinical

## 2023-06-17 DIAGNOSIS — F900 Attention-deficit hyperactivity disorder, predominantly inattentive type: Secondary | ICD-10-CM

## 2023-06-17 DIAGNOSIS — F331 Major depressive disorder, recurrent, moderate: Secondary | ICD-10-CM | POA: Diagnosis not present

## 2023-06-17 DIAGNOSIS — F4310 Post-traumatic stress disorder, unspecified: Secondary | ICD-10-CM | POA: Diagnosis not present

## 2023-06-17 DIAGNOSIS — F4522 Body dysmorphic disorder: Secondary | ICD-10-CM
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
## 2023-06-22 DIAGNOSIS — M1712 Unilateral primary osteoarthritis, left knee: Secondary | ICD-10-CM | POA: Diagnosis not present

## 2023-06-25 ENCOUNTER — Telehealth: Payer: Self-pay | Admitting: Family Medicine

## 2023-06-25 NOTE — Telephone Encounter (Signed)
I spoke with patient. VV scheduled for Monday at 4:30pm.

## 2023-06-25 NOTE — Telephone Encounter (Signed)
Requesting fill of Sumatriptan for migraines. Says it was previously prescribed by PCP. Says she has tried multiple OTCs with no relief.  [id number] [address] [address], [place] - [address] AT [address] Phone: [phone]  Fax: [phone]

## 2023-06-25 NOTE — Telephone Encounter (Signed)
I do not think we have discussed this problem in the past year or so. Please arrange a f/u appt. Thanks, Dawn Jordan

## 2023-06-28 ENCOUNTER — Encounter: Payer: Medicare HMO | Admitting: Family Medicine

## 2023-06-28 ENCOUNTER — Encounter: Payer: Self-pay | Admitting: Family Medicine

## 2023-06-28 NOTE — Progress Notes (Signed)
Virtual Visit via Video Note I connected with Dawn Jordan on 06/28/23 by a video enabled telemedicine application and verified that I am speaking with the correct person using two identifiers. Location patient: home Location provider:work or home office Persons participating in the virtual visit: patient, provider  I discussed the limitations of evaluation and management by telemedicine and the availability of in person appointments. The patient expressed understanding and agreed to proceed.  Chief Complaint  Patient presents with   Headache    HPI:  ROS: See pertinent positives and negatives per HPI.  Past Medical History:  Diagnosis Date   A-fib [facility])    ADD (attention deficit disorder)    Anemia    Anginal pain (HCC)    Anxiety    Asthma    Back pain    Bone spur of foot    Chest pain    Chronic fatigue syndrome    Complication of anesthesia    woke up during sugery once, and also with epidural at [facility] [address] had episode with epidural   Depression    Dyspnea    Dysrhythmia    AFIB   Fibromyalgia    GERD (gastroesophageal reflux disease)    Headache    History of gout    History of kidney stones    Hypertension    IBS (irritable bowel syndrome)    Joint pain    Left ankle pain    Left sciatic nerve pain    Myofascial pain    Neck pain    OSA (obstructive sleep apnea)    cpap   Palpitations    Rheumatoid arthritis (HCC)    Seasonal allergies     Past Surgical History:  Procedure Laterality Date   BILATERAL KNEE ARTHROSCOPY     CARPAL TUNNEL RELEASE Left    CYST EXCISION     "little cysts taken off both hands and left arm" (06/04/2017)   KNEE ARTHROSCOPY Bilateral    "3 on my left; 2 on my right" (06/04/2017)   KNEE SURGERY     LAPAROSCOPIC CHOLECYSTECTOMY     TONSILLECTOMY     TOTAL KNEE ARTHROPLASTY Right 11/24/2021   Procedure: TOTAL KNEE ARTHROPLASTY;  Surgeon: Ollen Gross, MD;  Location: [facility];  Service: Orthopedics;   Laterality: Right;    Family History  Problem Relation Age of Onset   Arthritis Mother    Hyperlipidemia Mother    Hypertension Mother    Stroke Mother    Mental retardation Mother    Diabetes Mother    Allergic rhinitis Mother    Heart disease Mother    Thyroid disease Mother    Depression Mother    Anxiety disorder Mother    Bipolar disorder Mother    Sleep apnea Mother    Eating disorder Mother    Diabetes Maternal Grandfather    Hypertension Maternal Grandfather    Diabetes Paternal Grandmother    Hypertension Paternal Grandmother    Cancer Paternal Grandmother        breast   Allergic rhinitis Brother    Colon cancer Paternal Aunt    Lung cancer Other    Lung cancer Maternal Uncle    Asthma Neg Hx    Eczema Neg Hx    Immunodeficiency Neg Hx    Urticaria Neg Hx    Atopy Neg Hx    Angioedema Neg Hx    Esophageal cancer Neg Hx    Stomach cancer Neg Hx    Rectal  cancer Neg Hx     Social History   Socioeconomic History   Marital status: Married    Spouse name: Maryn Freelove   Number of children: 0   Years of education: Not on file   Highest education level: Not on file  Occupational History   Occupation: not employed-disabled  Tobacco Use   Smoking status: Never   Smokeless tobacco: Never  Vaping Use   Vaping status: Former  Substance and Sexual Activity   Alcohol use: No   Drug use: No   Sexual activity: Never  Other Topics Concern   Not on file  Social History Narrative   Born in [place], [place]   Unemployed, on disability since 1991 for anxiety,    Education: [employer]   Single, no children, lives with her friend (used to live with her mother with stroke, now in ALF)      Social Determinants of Health   Financial Resource Strain: Low Risk  (05/31/2023)   Overall Financial Resource Strain (CARDIA)    Difficulty of Paying Living Expenses: Not hard at all  Food Insecurity: No Food Insecurity (05/31/2023)   Hunger  Vital Sign    Worried About Running Out of Food in the Last Year: Never true    Ran Out of Food in the Last Year: Never true  Transportation Needs: No Transportation Needs (05/31/2023)   PRAPARE - Administrator, Civil Service (Medical): No    Lack of Transportation (Non-Medical): No  Physical Activity: Inactive (05/31/2023)   Exercise Vital Sign    Days of Exercise per Week: 0 days    Minutes of Exercise per Session: 0 min  Stress: No Stress Concern Present (05/31/2023)   [employer] of Occupational Health - Occupational Stress Questionnaire    Feeling of Stress : Not at all  Social Connections: Socially Integrated (05/31/2023)   Social Connection and Isolation Panel [NHANES]    Frequency of Communication with Friends and Family: More than three times a week    Frequency of Social Gatherings with Friends and Family: More than three times a week    Attends Religious Services: More than 4 times per year    Active Member of [employer] or Organizations: Yes    Attends Banker Meetings: More than 4 times per year    Marital Status: Married  Catering manager Violence: Not At Risk (05/31/2023)   Humiliation, Afraid, Rape, and Kick questionnaire    Fear of Current or Ex-Partner: No    Emotionally Abused: No    Physically Abused: No    Sexually Abused: No     Current Outpatient Medications:    acetaminophen (TYLENOL) 650 MG CR tablet, Take 1,300 mg by mouth every 8 (eight) hours as needed for pain., Disp: , Rfl:    ACTEMRA 162 MG/0.9ML SOSY, Inject 162 mg as directed every Tuesday., Disp: , Rfl:    atomoxetine (STRATTERA) 60 MG capsule, Take 1 capsule (60 mg total) by mouth daily., Disp: 90 capsule, Rfl: 0   clonazePAM (KLONOPIN) 0.5 MG tablet, Take 1 tablet (0.5 mg total) by mouth 3 (three) times daily as needed for anxiety., Disp: 90 tablet, Rfl: 1   diphenhydrAMINE (BENADRYL ALLERGY) 25 mg capsule, Take 25-50mg  po qHS prn insomnia, Disp: 60 capsule, Rfl: 2    EPINEPHrine 0.3 mg/0.3 mL IJ SOAJ injection, Use for life threatening allergic reactions, Disp: 1 each, Rfl: 0   flecainide (TAMBOCOR) 50 MG tablet, TAKE ONE TABLET BY MOUTH  TWICE DAILY, Disp: 60 tablet, Rfl: 5   fluticasone (FLONASE) 50 MCG/ACT nasal spray, PLACE ONE SPRAY IN EACH NOSTRIL EVERY DAY, Disp: 16 g, Rfl: 0   fluticasone (FLOVENT HFA) 220 MCG/ACT inhaler, INHALE TWO PUFFS INTO THE LUNGS EVERY MORNING & AT BEDTIME, Disp: 12 g, Rfl: 0   levalbuterol (XOPENEX HFA) 45 MCG/ACT inhaler, INHALE TWO PUFFS INTO THE LUNGS EVERY 6 HOURS AS NEEDED FOR WHEEZING, Disp: 15 g, Rfl: 1   levocetirizine (XYZAL) 5 MG tablet, Take 1 tablet (5 mg total) by mouth every evening., Disp: 30 tablet, Rfl: 5   metoprolol succinate (TOPROL-XL) 50 MG 24 hr tablet, Take 1 tablet (50 mg total) by mouth daily. Take with or immediately following a meal., Disp: 90 tablet, Rfl: 3   montelukast (SINGULAIR) 10 MG tablet, TAKE ONE TABLET BY MOUTH EVERY DAY, Disp: 30 tablet, Rfl: 5   nitroGLYCERIN (NITROSTAT) 0.4 MG SL tablet, Place 1 tablet (0.4 mg total) under the tongue every 5 (five) minutes as needed for chest pain., Disp: 30 tablet, Rfl: 0   Olopatadine HCl 0.2 % SOLN, Place 1 drop into both eyes daily as needed., Disp: 2.5 mL, Rfl: 5   Oxycodone HCl 10 MG TABS, Take 10 mg by mouth daily as needed (pain)., Disp: , Rfl:    pantoprazole (PROTONIX) 40 MG tablet, Take 1 tablet (40 mg total) by mouth daily., Disp: 30 tablet, Rfl: 3   Polyvinyl Alcohol-Povidone (REFRESH OP), Place 1 drop into both eyes daily., Disp: , Rfl:    pregabalin (LYRICA) 200 MG capsule, Take 200 mg by mouth at bedtime., Disp: , Rfl:    triamcinolone ointment (KENALOG) 0.1 %, Apply 1 application topically 2 (two) times daily. (Patient taking differently: Apply 1 application  topically 2 (two) times daily as needed (eczema).), Disp: 60 g, Rfl: 5   Vilazodone HCl (VIIBRYD) 40 MG TABS, Take 1 tablet (40 mg total) by mouth daily., Disp: 90 tablet, Rfl:  0  Current Facility-Administered Medications:    0.9 %  sodium chloride infusion, 500 mL, Intravenous, Continuous, Rachael Fee, MD  EXAM:  VITALS per patient if applicable:  GENERAL: alert, oriented, appears well and in no acute distress  HEENT: atraumatic, conjunctiva clear, no obvious abnormalities on inspection of external nose and ears  NECK: normal movements of the head and neck  LUNGS: on inspection no signs of respiratory distress, breathing rate appears normal, no obvious gross SOB, gasping or wheezing  CV: no obvious cyanosis  MS: moves all visible extremities without noticeable abnormality  PSYCH/NEURO: pleasant and cooperative, no obvious depression or anxiety, speech and thought processing grossly intact  ASSESSMENT AND PLAN:  Discussed the following assessment and plan:  No diagnosis found.  There are no diagnoses linked to this encounter.  We discussed possible serious and likely etiologies, options for evaluation and workup, limitations of telemedicine visit vs in person visit, treatment, treatment risks and precautions. The patient was advised to call back or seek an in-person evaluation if the symptoms worsen or if the condition fails to improve as anticipated. I discussed the assessment and treatment plan with the patient. The patient was provided an opportunity to ask questions and all were answered. The patient agreed with the plan and demonstrated an understanding of the instructions.  No follow-ups on file.

## 2023-06-29 DIAGNOSIS — M797 Fibromyalgia: Secondary | ICD-10-CM | POA: Diagnosis not present

## 2023-06-29 DIAGNOSIS — M5136 Other intervertebral disc degeneration, lumbar region: Secondary | ICD-10-CM | POA: Diagnosis not present

## 2023-06-29 DIAGNOSIS — G894 Chronic pain syndrome: Secondary | ICD-10-CM | POA: Diagnosis not present

## 2023-06-29 DIAGNOSIS — Z79899 Other long term (current) drug therapy: Secondary | ICD-10-CM | POA: Diagnosis not present

## 2023-06-29 DIAGNOSIS — Z5181 Encounter for therapeutic drug level monitoring: Secondary | ICD-10-CM | POA: Diagnosis not present

## 2023-06-29 DIAGNOSIS — M5459 Other low back pain: Secondary | ICD-10-CM | POA: Diagnosis not present

## 2023-06-29 DIAGNOSIS — M5030 Other cervical disc degeneration, unspecified cervical region: Secondary | ICD-10-CM | POA: Diagnosis not present

## 2023-07-14 ENCOUNTER — Other Ambulatory Visit: Payer: Self-pay | Admitting: Family Medicine

## 2023-07-14 ENCOUNTER — Other Ambulatory Visit (HOSPITAL_COMMUNITY): Payer: Self-pay | Admitting: Psychiatry

## 2023-07-14 DIAGNOSIS — F900 Attention-deficit hyperactivity disorder, predominantly inattentive type: Secondary | ICD-10-CM

## 2023-07-15 ENCOUNTER — Encounter (HOSPITAL_COMMUNITY): Payer: Self-pay | Admitting: Clinical

## 2023-07-15 ENCOUNTER — Ambulatory Visit (HOSPITAL_COMMUNITY): Payer: Medicare HMO | Admitting: Clinical

## 2023-07-15 DIAGNOSIS — F4310 Post-traumatic stress disorder, unspecified: Secondary | ICD-10-CM | POA: Diagnosis not present

## 2023-07-15 DIAGNOSIS — F4522 Body dysmorphic disorder: Secondary | ICD-10-CM | POA: Diagnosis not present

## 2023-07-15 DIAGNOSIS — F900 Attention-deficit hyperactivity disorder, predominantly inattentive type: Secondary | ICD-10-CM | POA: Diagnosis not present

## 2023-07-15 DIAGNOSIS — F331 Major depressive disorder, recurrent, moderate: Secondary | ICD-10-CM
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
## 2023-07-23 ENCOUNTER — Ambulatory Visit (HOSPITAL_COMMUNITY): Payer: Medicare HMO | Admitting: Clinical

## 2023-07-23 ENCOUNTER — Encounter (HOSPITAL_COMMUNITY): Payer: Self-pay | Admitting: Clinical

## 2023-07-23 DIAGNOSIS — F900 Attention-deficit hyperactivity disorder, predominantly inattentive type: Secondary | ICD-10-CM | POA: Diagnosis not present

## 2023-07-23 DIAGNOSIS — F331 Major depressive disorder, recurrent, moderate: Secondary | ICD-10-CM

## 2023-07-23 DIAGNOSIS — F4522 Body dysmorphic disorder: Secondary | ICD-10-CM

## 2023-07-23 DIAGNOSIS — F4310 Post-traumatic stress disorder, unspecified: Secondary | ICD-10-CM
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
## 2023-08-02 ENCOUNTER — Encounter (HOSPITAL_COMMUNITY): Payer: Self-pay | Admitting: Clinical

## 2023-08-02 ENCOUNTER — Ambulatory Visit (INDEPENDENT_AMBULATORY_CARE_PROVIDER_SITE_OTHER): Payer: Medicare HMO | Admitting: Clinical

## 2023-08-02 DIAGNOSIS — F331 Major depressive disorder, recurrent, moderate: Secondary | ICD-10-CM | POA: Diagnosis not present

## 2023-08-02 DIAGNOSIS — F4522 Body dysmorphic disorder: Secondary | ICD-10-CM

## 2023-08-02 DIAGNOSIS — F900 Attention-deficit hyperactivity disorder, predominantly inattentive type: Secondary | ICD-10-CM

## 2023-08-02 DIAGNOSIS — F4310 Post-traumatic stress disorder, unspecified: Secondary | ICD-10-CM
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
## 2023-08-18 ENCOUNTER — Ambulatory Visit (INDEPENDENT_AMBULATORY_CARE_PROVIDER_SITE_OTHER): Payer: Medicare HMO | Admitting: Clinical

## 2023-08-18 ENCOUNTER — Encounter (HOSPITAL_COMMUNITY): Payer: Self-pay | Admitting: Clinical

## 2023-08-18 DIAGNOSIS — F4522 Body dysmorphic disorder: Secondary | ICD-10-CM | POA: Diagnosis not present

## 2023-08-18 DIAGNOSIS — F331 Major depressive disorder, recurrent, moderate: Secondary | ICD-10-CM | POA: Diagnosis not present

## 2023-08-18 DIAGNOSIS — F4310 Post-traumatic stress disorder, unspecified: Secondary | ICD-10-CM

## 2023-08-18 DIAGNOSIS — F900 Attention-deficit hyperactivity disorder, predominantly inattentive type: Secondary | ICD-10-CM

## 2023-08-18 DIAGNOSIS — M1712 Unilateral primary osteoarthritis, left knee: Secondary | ICD-10-CM | POA: Diagnosis not present
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
## 2023-08-24 DIAGNOSIS — H6693 Otitis media, unspecified, bilateral: Secondary | ICD-10-CM | POA: Diagnosis not present

## 2023-08-24 DIAGNOSIS — J029 Acute pharyngitis, unspecified: Secondary | ICD-10-CM | POA: Diagnosis not present

## 2023-08-24 DIAGNOSIS — Z20822 Contact with and (suspected) exposure to covid-19: Secondary | ICD-10-CM | POA: Diagnosis not present

## 2023-08-30 ENCOUNTER — Ambulatory Visit (INDEPENDENT_AMBULATORY_CARE_PROVIDER_SITE_OTHER): Payer: Medicare HMO | Admitting: Clinical

## 2023-08-30 DIAGNOSIS — Z91198 Patient's noncompliance with other medical treatment and regimen for other reason: Secondary | ICD-10-CM

## 2023-08-30 NOTE — Progress Notes (Signed)
Patient ID: Dawn Jordan, adult   DOB: Jun 25, 1966, 57 y.o.   MRN: [id number]  Therapy Progress Note  Patient had an appointment scheduled with therapist on 08/30/2023  at 10:00a,.  When zey did not arrive into the virtual session, CSW sent a text to [phone]  at 10:01am and when zey still did not arrive, called.  [name] driving home from [place] where zey took care of wife with COVID.  Mother was in car.  [name] were informed of upcoming appointments with both doctor and therapist on same day 10/23.  [name] asked if CSW had any assignments for zem, were told to engage in self-care.     Encounter Diagnosis  Name Primary?   Failure to attend appointment with reason given Yes     Ambrose Mantle, LCSW 08/30/2023, 10:19 AM

## 2023-09-02 DIAGNOSIS — M1991 Primary osteoarthritis, unspecified site: Secondary | ICD-10-CM | POA: Diagnosis not present

## 2023-09-02 DIAGNOSIS — M797 Fibromyalgia: Secondary | ICD-10-CM | POA: Diagnosis not present

## 2023-09-02 DIAGNOSIS — M0609 Rheumatoid arthritis without rheumatoid factor, multiple sites: Secondary | ICD-10-CM | POA: Diagnosis not present

## 2023-09-02 DIAGNOSIS — Z79899 Other long term (current) drug therapy: Secondary | ICD-10-CM | POA: Diagnosis not present

## 2023-09-02 DIAGNOSIS — R5382 Chronic fatigue, unspecified: Secondary | ICD-10-CM | POA: Diagnosis not present

## 2023-09-02 DIAGNOSIS — R7989 Other specified abnormal findings of blood chemistry: Secondary | ICD-10-CM | POA: Diagnosis not present

## 2023-09-02 DIAGNOSIS — E785 Hyperlipidemia, unspecified: Secondary | ICD-10-CM | POA: Diagnosis not present

## 2023-09-02 DIAGNOSIS — Z6841 Body Mass Index (BMI) 40.0 and over, adult: Secondary | ICD-10-CM | POA: Diagnosis not present

## 2023-09-06 ENCOUNTER — Other Ambulatory Visit: Payer: Self-pay | Admitting: Family Medicine

## 2023-09-07 ENCOUNTER — Ambulatory Visit: Payer: Medicare HMO | Admitting: Family

## 2023-09-20 ENCOUNTER — Ambulatory Visit (HOSPITAL_COMMUNITY): Payer: Medicare HMO | Admitting: Student

## 2023-09-27 ENCOUNTER — Observation Stay (HOSPITAL_BASED_OUTPATIENT_CLINIC_OR_DEPARTMENT_OTHER): Payer: Medicare HMO

## 2023-09-27 ENCOUNTER — Ambulatory Visit (HOSPITAL_COMMUNITY): Payer: Medicare HMO | Admitting: Student

## 2023-09-27 ENCOUNTER — Observation Stay (HOSPITAL_COMMUNITY)
Admission: EM | Admit: 2023-09-27 | Discharge: 2023-09-28 | Disposition: A | Discharge status: Home / Self Care | Payer: Medicare HMO | Attending: Internal Medicine | Admitting: Internal Medicine

## 2023-09-27 ENCOUNTER — Other Ambulatory Visit: Payer: Self-pay

## 2023-09-27 ENCOUNTER — Encounter (HOSPITAL_COMMUNITY): Payer: Self-pay | Admitting: Emergency Medicine

## 2023-09-27 ENCOUNTER — Emergency Department (HOSPITAL_COMMUNITY): Payer: Medicare HMO

## 2023-09-27 DIAGNOSIS — I959 Hypotension, unspecified: Secondary | ICD-10-CM

## 2023-09-27 DIAGNOSIS — Z96651 Presence of right artificial knee joint: Secondary | ICD-10-CM | POA: Diagnosis not present

## 2023-09-27 DIAGNOSIS — J45909 Unspecified asthma, uncomplicated: Secondary | ICD-10-CM

## 2023-09-27 DIAGNOSIS — I4891 Unspecified atrial fibrillation: Secondary | ICD-10-CM | POA: Diagnosis not present

## 2023-09-27 DIAGNOSIS — Z79899 Other long term (current) drug therapy: Secondary | ICD-10-CM | POA: Insufficient documentation

## 2023-09-27 DIAGNOSIS — M797 Fibromyalgia: Secondary | ICD-10-CM | POA: Diagnosis present

## 2023-09-27 DIAGNOSIS — R739 Hyperglycemia, unspecified: Secondary | ICD-10-CM

## 2023-09-27 DIAGNOSIS — K219 Gastro-esophageal reflux disease without esophagitis: Secondary | ICD-10-CM | POA: Diagnosis not present

## 2023-09-27 DIAGNOSIS — M069 Rheumatoid arthritis, unspecified: Secondary | ICD-10-CM | POA: Diagnosis not present

## 2023-09-27 DIAGNOSIS — F419 Anxiety disorder, unspecified: Secondary | ICD-10-CM | POA: Diagnosis not present

## 2023-09-27 DIAGNOSIS — I480 Paroxysmal atrial fibrillation: Principal | ICD-10-CM

## 2023-09-27 DIAGNOSIS — F32A Depression, unspecified: Secondary | ICD-10-CM

## 2023-09-27 DIAGNOSIS — Z91040 Latex allergy status: Secondary | ICD-10-CM | POA: Diagnosis not present

## 2023-09-27 DIAGNOSIS — I499 Cardiac arrhythmia, unspecified: Secondary | ICD-10-CM | POA: Diagnosis not present

## 2023-09-27 DIAGNOSIS — Z743 Need for continuous supervision: Secondary | ICD-10-CM | POA: Diagnosis not present

## 2023-09-27 DIAGNOSIS — R079 Chest pain, unspecified: Secondary | ICD-10-CM | POA: Diagnosis not present

## 2023-09-27 DIAGNOSIS — I10 Essential (primary) hypertension: Secondary | ICD-10-CM | POA: Insufficient documentation

## 2023-09-27 DIAGNOSIS — I213 ST elevation (STEMI) myocardial infarction of unspecified site: Secondary | ICD-10-CM | POA: Diagnosis not present

## 2023-09-27 HISTORY — DX: Depression, unspecified: F32.A

## 2023-09-27 HISTORY — DX: Anxiety disorder, unspecified: F41.9

## 2023-09-27 LAB — BASIC METABOLIC PANEL WITH GFR
Anion gap: 12 (ref 5–15)
BUN: 21 mg/dL — ABNORMAL HIGH (ref 6–20)
CO2: 21 mmol/L — ABNORMAL LOW (ref 22–32)
Calcium: 9.8 mg/dL (ref 8.9–10.3)
Chloride: 109 mmol/L (ref 98–111)
Creatinine, Ser: 0.92 mg/dL (ref 0.44–1.00)
GFR, Estimated: >60 mL/min
Glucose, Bld: 198 mg/dL — ABNORMAL HIGH (ref 70–99)
Potassium: 3.6 mmol/L (ref 3.5–5.1)
Sodium: 142 mmol/L (ref 135–145)

## 2023-09-27 LAB — ECHOCARDIOGRAM COMPLETE
AR max vel: 1.69 cm2
AV Peak grad: 13.7 mm[Hg]
Ao pk vel: 1.85 m/s
Area-P 1/2: 3.72 cm2
S' Lateral: 2.8 cm

## 2023-09-27 LAB — CBC
HCT: 38.7 % (ref 36.0–46.0)
Hemoglobin: 12.7 g/dL (ref 12.0–15.0)
MCH: 29.9 pg (ref 26.0–34.0)
MCHC: 32.8 g/dL (ref 30.0–36.0)
MCV: 91.1 fL (ref 80.0–100.0)
Platelets: 189 10*3/uL (ref 150–400)
RBC: 4.25 MIL/uL (ref 3.87–5.11)
RDW: 12.9 % (ref 11.5–15.5)
WBC: 8.2 10*3/uL (ref 4.0–10.5)
nRBC: 0 % (ref 0.0–0.2)

## 2023-09-27 LAB — D-DIMER, QUANTITATIVE: D-Dimer, Quant: <0.27 ug{FEU}/mL (ref 0.00–0.50)

## 2023-09-27 LAB — T4, FREE: Free T4: 1 ng/dL (ref 0.61–1.12)

## 2023-09-27 LAB — TSH: TSH: 0.135 u[IU]/mL — ABNORMAL LOW (ref 0.350–4.500)

## 2023-09-27 LAB — HEMOGLOBIN A1C
Hgb A1c MFr Bld: 5.2 % (ref 4.8–5.6)
Mean Plasma Glucose: 102.54 mg/dL

## 2023-09-27 LAB — TROPONIN I (HIGH SENSITIVITY)
Troponin I (High Sensitivity): 72 ng/L — ABNORMAL HIGH (ref <18)
Troponin I (High Sensitivity): 61 ng/L — ABNORMAL HIGH (ref <18)

## 2023-09-27 LAB — MAGNESIUM: Magnesium: 2 mg/dL (ref 1.7–2.4)

## 2023-09-27 MED ORDER — ONDANSETRON HCL 4 MG/2ML IJ SOLN: 4.0000 mg | Freq: Four times a day (QID) | INTRAMUSCULAR | Status: DC | PRN

## 2023-09-27 MED ORDER — APIXABAN 5 MG PO TABS
5.0000 mg | ORAL_TABLET | Freq: Two times a day (BID) | ORAL | Status: DC
  Administered 2023-09-27 – 2023-09-28 (×2): 5 mg via ORAL
  Filled 2023-09-27 (×2): qty 1

## 2023-09-27 MED ORDER — GABAPENTIN 300 MG PO CAPS
300.0000 mg | ORAL_CAPSULE | Freq: Every day | ORAL | Status: DC
  Administered 2023-09-27: 300 mg via ORAL
  Filled 2023-09-27: qty 1

## 2023-09-27 MED ORDER — ACETAMINOPHEN 325 MG PO TABS
650.0000 mg | ORAL_TABLET | Freq: Four times a day (QID) | ORAL | Status: DC | PRN
  Administered 2023-09-27: 650 mg via ORAL
  Filled 2023-09-27: qty 2

## 2023-09-27 MED ORDER — MONTELUKAST SODIUM 10 MG PO TABS
10.0000 mg | ORAL_TABLET | Freq: Every day | ORAL | Status: DC
  Administered 2023-09-27 – 2023-09-28 (×2): 10 mg via ORAL
  Filled 2023-09-27 (×2): qty 1

## 2023-09-27 MED ORDER — ALBUTEROL SULFATE (2.5 MG/3ML) 0.083% IN NEBU: 2.5000 mg | INHALATION_SOLUTION | Freq: Four times a day (QID) | RESPIRATORY_TRACT | Status: DC | PRN

## 2023-09-27 MED ORDER — DILTIAZEM HCL-DEXTROSE 125-5 MG/125ML-% IV SOLN (PREMIX)
5.0000 mg/h | INTRAVENOUS | Status: DC
Start: 2023-09-27 — End: 2023-09-27
  Administered 2023-09-27: 5 mg/h via INTRAVENOUS
  Filled 2023-09-27: qty 125

## 2023-09-27 MED ORDER — METOPROLOL SUCCINATE ER 25 MG PO TB24
12.5000 mg | ORAL_TABLET | Freq: Every day | ORAL | Status: DC
  Administered 2023-09-27 – 2023-09-28 (×2): 12.5 mg via ORAL
  Filled 2023-09-27 (×2): qty 1

## 2023-09-27 MED ORDER — PANTOPRAZOLE SODIUM 40 MG PO TBEC
40.0000 mg | DELAYED_RELEASE_TABLET | Freq: Every day | ORAL | Status: DC
Start: 2023-09-27 — End: 2023-09-28
  Administered 2023-09-27 – 2023-09-28 (×2): 40 mg via ORAL
  Filled 2023-09-27 (×2): qty 1

## 2023-09-27 MED ORDER — CLONAZEPAM 0.5 MG PO TABS: 0.5000 mg | ORAL_TABLET | Freq: Three times a day (TID) | ORAL | Status: DC | PRN

## 2023-09-27 MED ORDER — NABUMETONE 500 MG PO TABS
500.0000 mg | ORAL_TABLET | Freq: Every day | ORAL | Status: DC
  Administered 2023-09-27: 500 mg via ORAL
  Filled 2023-09-27 (×2): qty 1

## 2023-09-27 MED ORDER — FLECAINIDE ACETATE 50 MG PO TABS
50.0000 mg | ORAL_TABLET | Freq: Two times a day (BID) | ORAL | Status: DC
  Administered 2023-09-27 – 2023-09-28 (×2): 50 mg via ORAL
  Filled 2023-09-27 (×2): qty 1

## 2023-09-27 MED ORDER — ENOXAPARIN SODIUM 40 MG/0.4ML IJ SOSY
40.0000 mg | PREFILLED_SYRINGE | INTRAMUSCULAR | Status: DC
  Administered 2023-09-27: 40 mg via SUBCUTANEOUS
  Filled 2023-09-27: qty 0.4

## 2023-09-27 MED ORDER — POTASSIUM CHLORIDE CRYS ER 20 MEQ PO TBCR
40.0000 meq | EXTENDED_RELEASE_TABLET | Freq: Once | ORAL | Status: AC
  Administered 2023-09-27: 40 meq via ORAL
  Filled 2023-09-27: qty 2

## 2023-09-27 MED ORDER — LORATADINE 10 MG PO TABS
10.0000 mg | ORAL_TABLET | Freq: Every evening | ORAL | Status: DC
  Administered 2023-09-27: 10 mg via ORAL
  Filled 2023-09-27: qty 1

## 2023-09-27 MED ORDER — ACETAMINOPHEN 650 MG RE SUPP: 650.0000 mg | Freq: Four times a day (QID) | RECTAL | Status: DC | PRN

## 2023-09-27 MED ORDER — ONDANSETRON HCL 4 MG PO TABS: 4.0000 mg | ORAL_TABLET | Freq: Four times a day (QID) | ORAL | Status: DC | PRN

## 2023-09-27 NOTE — Care Management Obs Status (Signed)
MEDICARE OBSERVATION STATUS NOTIFICATION   Patient Details  Name: Dawn Jordan MRN: [id number] Date of Birth: 01/24/1966   Medicare Observation Status Notification Given:  Yes    Ronny Bacon, RN 09/27/2023, 11:25 AM

## 2023-09-27 NOTE — H&P (Signed)
History and Physical    Patient: Dawn Jordan FTD:[id number] DOB: June 08, 1966 DOA: 09/27/2023 DOS: the patient was seen and examined on 09/27/2023 PCP: [place], Betty G, MD  Patient coming from: Home via EMS  Chief Complaint:  Chief Complaint  Patient presents with   Irregular Heart Beat   HPI: Dawn Jordan is a 57 y.o. adult with medical history significant of hypertension, atrial fibrillation previously on flecainide, rheumatoid arthritis, anxiety, depression, GERD, and obesity who presented with complaints of chest pain.  Patient reports that symptoms woke her up this morning out of her sleep.  She reports having a stabbing/squeezing pain that was severe in the left side of her chest with some radiation to the middle.  Noted associated symptoms of shortness of breath, palpitations, and clamminess.  Denies having any nausea or vomiting. She had taken full dose aspirin and 3 sublingual nitroglycerin prior to calling EMS without improvement.   She notes that she had been intermittently having palpitations for which symptoms were initially thought secondary to anxiety.  She currently had been in the process of moving from [place] to [place], [place] and been under a lot of stress.  Patient had been being followed by Dr.Skains of cardiology in outpatient setting.    She had worn a Holter monitor back in July which noted no episodes of atrial fibrillation/VT, brief episodes of atrial tachycardia, and rare PVCs/PACs.  Also had negative PET CT cardiac perfusion study in February of this year.  She previously had been on flecainide and metoprolol, but notes that these medications were advised to be weaned off.  In route with EMS heart rates were noted to be elevated up to 172 and A-fib with RVR.  She had been given Cardizem 20 mg IV and additional 25 mg IV 15 minutes after initial dose with improvement in heart rates.  In the emergency department patient was noted to be in atrial fibrillation with heart  rates elevated up to 141, blood pressures as low as 86/50, and all other vital signs maintained.  Labs noted potassium 3.6, CO2 21, BUN 21, creatinine 0.92, glucose 198, and magnesium 2.  Patient had been started on Cardizem drip and given potassium chloride 40 meq p.o.  Review of Systems: As mentioned in the history of present illness. All other systems reviewed and are negative. Past Medical History:  Diagnosis Date   A-fib [facility])    ADD (attention deficit disorder)    Anemia    Anginal pain (HCC)    Anxiety    Asthma    Back pain    Bone spur of foot    Chest pain    Chronic fatigue syndrome    Complication of anesthesia    woke up during sugery once, and also with epidural at surgery center on [address] had episode with epidural   Depression    Dyspnea    Dysrhythmia    AFIB   Fibromyalgia    GERD (gastroesophageal reflux disease)    Headache    History of gout    History of kidney stones    Hypertension    IBS (irritable bowel syndrome)    Joint pain    Left ankle pain    Left sciatic nerve pain    Myofascial pain    Neck pain    OSA (obstructive sleep apnea)    cpap   Palpitations    Rheumatoid arthritis (HCC)    Seasonal allergies    Past Surgical History:  Procedure Laterality Date  BILATERAL KNEE ARTHROSCOPY     CARPAL TUNNEL RELEASE Left    CYST EXCISION     "little cysts taken off both hands and left arm" (06/04/2017)   KNEE ARTHROSCOPY Bilateral    "3 on my left; 2 on my right" (06/04/2017)   KNEE SURGERY     LAPAROSCOPIC CHOLECYSTECTOMY     TONSILLECTOMY     TOTAL KNEE ARTHROPLASTY Right 11/24/2021   Procedure: TOTAL KNEE ARTHROPLASTY;  Surgeon: Ollen Gross, MD;  Location: [facility];  Service: Orthopedics;  Laterality: Right;   Social History:  reports that Nekeshia has never smoked. Fenet has never used smokeless tobacco. Sylvi reports that Mozella does not drink alcohol and does not use drugs.  Allergies  Allergen Reactions   Almond (Diagnostic)  Anaphylaxis   Egg-Derived Products Diarrhea   Latex Anaphylaxis, Hives and Itching    "Anaphylaxis is only when I am in a closed area (ex: car with balloons)"   Other Other (See Comments)    Sinus headache from new plastics, carpets, etc.   Date Seed Extract [Zizyphus Jujuba] Other (See Comments)    Burning sensation on tongue.    Codeine     Headaches     Family History  Problem Relation Age of Onset   Arthritis Mother    Hyperlipidemia Mother    Hypertension Mother    Stroke Mother    Mental retardation Mother    Diabetes Mother    Allergic rhinitis Mother    Heart disease Mother    Thyroid disease Mother    Depression Mother    Anxiety disorder Mother    Bipolar disorder Mother    Sleep apnea Mother    Eating disorder Mother    Diabetes Maternal Grandfather    Hypertension Maternal Grandfather    Diabetes Paternal Grandmother    Hypertension Paternal Grandmother    Cancer Paternal Grandmother        breast   Allergic rhinitis Brother    Colon cancer Paternal Aunt    Lung cancer Other    Lung cancer Maternal Uncle    Asthma Neg Hx    Eczema Neg Hx    Immunodeficiency Neg Hx    Urticaria Neg Hx    Atopy Neg Hx    Angioedema Neg Hx    Esophageal cancer Neg Hx    Stomach cancer Neg Hx    Rectal cancer Neg Hx     Prior to Admission medications   Medication Sig Start Date End Date Taking? Authorizing Provider  acetaminophen (TYLENOL) 650 MG CR tablet Take 1,300 mg by mouth every 8 (eight) hours as needed for pain.    [provider]  ACTEMRA 162 MG/0.9ML SOSY Inject 162 mg as directed every Tuesday. 10/17/18   [provider]  atomoxetine (STRATTERA) 60 MG capsule Take 1 capsule (60 mg total) by mouth daily. 06/03/23 06/02/24  Oletta Darter, MD  clonazePAM (KLONOPIN) 0.5 MG tablet Take 1 tablet (0.5 mg total) by mouth 3 (three) times daily as needed for anxiety. 04/22/23   Oletta Darter, MD  diphenhydrAMINE (BENADRYL ALLERGY) 25 mg capsule Take  25-50mg  po qHS prn insomnia 04/22/23   Oletta Darter, MD  EPINEPHrine 0.3 mg/0.3 mL IJ SOAJ injection Use for life threatening allergic reactions 11/10/22   Ambs, Norvel Richards, FNP  flecainide (TAMBOCOR) 50 MG tablet TAKE ONE TABLET BY MOUTH TWICE DAILY 06/11/23   Jake Bathe, MD  fluticasone (FLONASE) 50 MCG/ACT nasal spray PLACE ONE SPRAY IN EACH NOSTRIL EVERY  DAY 08/27/22   Hetty Blend, FNP  fluticasone (FLOVENT HFA) 220 MCG/ACT inhaler INHALE TWO PUFFS INTO THE LUNGS EVERY MORNING & AT BEDTIME 06/02/22   Ellamae Sia, DO  levalbuterol [facility] HFA) 45 MCG/ACT inhaler INHALE TWO PUFFS INTO THE LUNGS EVERY 6 HOURS AS NEEDED FOR WHEEZING 03/09/22   Ellamae Sia, DO  levocetirizine (XYZAL) 5 MG tablet Take 1 tablet (5 mg total) by mouth every evening. 11/13/21   Ellamae Sia, DO  metoprolol succinate (TOPROL-XL) 50 MG 24 hr tablet Take 1 tablet (50 mg total) by mouth daily. Take with or immediately following a meal. 04/16/23 07/15/23  Jake Bathe, MD  montelukast (SINGULAIR) 10 MG tablet TAKE ONE TABLET BY MOUTH EVERY DAY 01/13/23   Ambs, Norvel Richards, FNP  nitroGLYCERIN (NITROSTAT) 0.4 MG SL tablet Place 1 tablet (0.4 mg total) under the tongue every 5 (five) minutes as needed for chest pain. 11/17/22   Smitty Knudsen, PA-C  Olopatadine HCl 0.2 % SOLN Place 1 drop into both eyes daily as needed. 11/13/21   Ellamae Sia, DO  Oxycodone HCl 10 MG TABS Take 10 mg by mouth daily as needed (pain).    [provider]  pantoprazole (PROTONIX) 40 MG tablet Take 1 tablet (40 mg total) by mouth daily. 04/16/23   [place], Betty G, MD  Polyvinyl Alcohol-Povidone (REFRESH OP) Place 1 drop into both eyes daily.    [provider]  pregabalin (LYRICA) 200 MG capsule Take 200 mg by mouth at bedtime.    [provider]  triamcinolone ointment (KENALOG) 0.1 % Apply 1 application topically 2 (two) times daily. Patient taking differently: Apply 1 application  topically 2 (two) times daily as needed (eczema). 03/29/20    Hetty Blend, FNP  Vilazodone HCl (VIIBRYD) 40 MG TABS Take 1 tablet (40 mg total) by mouth daily. 06/03/23   Oletta Darter, MD    Physical Exam: Vitals:   09/27/23 0630 09/27/23 0645 09/27/23 0700 09/27/23 0729  BP: (!) 86/50 (!) 99/46 109/87   Pulse: 66 62 79   Resp: 14 14 15    Temp:    97.9 F (36.6 C)  TempSrc:    Oral  SpO2: 98% 98% 99%     Constitutional: Middle-age female currently in no acute distress. Eyes: PERRL, lids and conjunctivae normal ENMT: Mucous membranes are moist. Posterior pharynx clear of any exudate or lesions.Normal dentition.  Neck: normal, supple, no masses, no thyromegaly Respiratory: clear to auscultation bilaterally, no wheezing, no crackles. Normal respiratory effort. No accessory muscle use.  Cardiovascular: Regular rate and rhythm, no murmurs / rubs / gallops. No extremity edema. 2+ pedal pulses. No carotid bruits.  Abdomen: no tenderness, no masses palpated. No hepatosplenomegaly. Bowel sounds positive.  Musculoskeletal: no clubbing / cyanosis. No joint deformity upper and lower extremities. Good ROM, no contractures. Normal muscle tone.  Skin: no rashes, lesions, ulcers. No induration Neurologic: CN 2-12 grossly intact. Sensation intact, DTR normal. Strength 5/5 in all 4.  Psychiatric: Normal judgment and insight. Alert and oriented x 3. Normal mood.   Data Reviewed:  EKG revealed atrial fibrillation at 99 bpm.  Reviewed labs, imaging, and pertinent records as documented.  Assessment and Plan:  Atrial fibrillation with RVR Patient presented with complaints of palpitations and was found to be in A-fib with RVR with heart rates elevated up to 171 with EMS.  Chest x-ray was otherwise noted to be clear.  She had a prior history of atrial fibrillation  for which she had been on flecainide, but was weaned off.  Patient had worn Holter monitor which had not shown any significant episodes of atrial fibrillation or ventricular tachycardia in July.  Patient  had been started on Cardizem drip and given potassium chloride 40 mEq p.o.. CHA2DS2-VASc score equal to at least 2 which anticoagulation could be considered. -Admit to a cardiac telemetry bed -Goal potassium at least 4 and magnesium at least 2.  Continue to monitor and replace electrolytes as needed to goal -Check TSH   -Check echocardiogram -Continue Cardizem drip -Cardiology consulted, will follow-up for any further recommendations  Chest pain Patient had reported complaints of chest pain.  High-sensitivity troponin 72.  She had taken full dose aspirin and 3 sublingual nitroglycerin prior to EMS arrival.  Review of records note she was evaluated by cardiology earlier this year due to complaints of chest pain, and underwent NM PET CT cardiac perfusion study which was noted to be normal and low risk.  Suspect symptoms secondary to rapid atrial fibrillation. -Check cardiac troponins and D-dimer  Transient hypotension Blood pressure noted to be as low as 86/50.  Blood pressures improved without significant intervention. -Goal MAP >65  Hyperglycemia Acute.  Glucose elevated at 198.  Last hemoglobin A1c from 12/15/2022 noted to be 5.6.  Patient does have a history of prediabetes.  Symptoms could be due to acute stress response. -Add-on hemoglobin A1c  Asthma, without acute exacerbation Patient without significant wheezing on physical exam at this time. -Albuterol nebs as needed for shortness of breath/wheezing  Anxiety and depression -Continue Klonopin as needed for anxiety  Rheumatoid arthritis Patient on Actemra in the outpatient setting with the last dose given 10/20. -Continue outpatient medication regimen.   Fibromyalgia -Continue current pain medication regimen.  May need to discontinue Nabumetone if anticoagulation recommended.  GERD -Continue pharmacy substitution of Protonix   DVT prophylaxis: Lovenox Advance Care Planning:   Code Status: Full Code   Consults:  Cardiology  Family Communication: No family requested to be updated when asked.  Patient stated that she was updating her family.  Severity of Illness: The appropriate patient status for this patient is OBSERVATION. Observation status is judged to be reasonable and necessary in order to provide the required intensity of service to ensure the patient's safety. The patient's presenting symptoms, physical exam findings, and initial radiographic and laboratory data in the context of their medical condition is felt to place them at decreased risk for further clinical deterioration. Furthermore, it is anticipated that the patient will be medically stable for discharge from the hospital within 2 midnights of admission.   Author: Clydie Braun, MD 09/27/2023 7:33 AM  For on call review [email or web address].

## 2023-09-27 NOTE — ED Notes (Signed)
Patient ambulated to restroom.  Steady gait noted.  No reports of dizziness.

## 2023-09-27 NOTE — ED Notes (Signed)
ED TO INPATIENT HANDOFF REPORT  ED Nurse Name and Phone #: Grover Canavan [phone]  S Name/Age/Gender Dawn Jordan 57 y.o. adult Room/Bed: 001C/001C  Code Status   Code Status: Full Code  Home/SNF/Other Home Patient oriented to: self, place, time, and situation Is this baseline? Yes   Triage Complete: Triage complete  Chief Complaint Atrial fibrillation with RVR [facility]) [I48.91]  Triage Note Patient BIB EMS for evaluation of chest discomfort and heart palpations.  Reports she was awaken from sleep and felt like her heart was racing.  Reports increased stress at home and currently moving.  Took ASA and nitroglycerin SL x 3 prior to calling EMS.  EMS noted patient with irregular rhythm.  Given Cardizem 20 mg IV and another dose of Cardizem 25 mg IV prior to arrival.   Pt has no chest pain at this time.  States "its only a tightness."   Allergies Allergies  Allergen Reactions   Almond (Diagnostic) Anaphylaxis   Egg-Derived Products Diarrhea and Other (See Comments)    GI intolerance    Latex Anaphylaxis, Hives and Itching    "Anaphylaxis is only when I am in a closed area (ex: car with balloons)"   Other Other (See Comments)    Sinus headache from new plastics, carpets, etc.   Banana Other (See Comments)    Mouth burning   Food Other (See Comments)    Dates - Mouth burning   Kiwi Extract Other (See Comments)    Mouth burning   Norco [Hydrocodone-Acetaminophen] Itching    OK with Benadryl premed   Oxycontin [Oxycodone] Itching    OK with Benadryl premed   Codeine Other (See Comments)    Headaches    Wound Dressing Adhesive Itching and Rash    Blistering rash    Level of Care/Admitting Diagnosis ED Disposition     ED Disposition  Admit   Condition  --   Comment  Hospital Area: [facility] [[id number]]  Level of Care: Telemetry Cardiac [[id number]]  May place patient in observation at [facility] or [facility] if equivalent level of care is available:: No  Covid  Evaluation: Asymptomatic - no recent exposure (last 10 days) testing not required  Diagnosis: Atrial fibrillation with RVR [facility]) [[id number]]  Admitting Physician: Angie Fava [[id number]]  Attending Physician: Angie Fava [[id number]]          B Medical/Surgery History Past Medical History:  Diagnosis Date   A-fib (HCC)    ADD (attention deficit disorder)    Anemia    Anginal pain (HCC)    Anxiety    Asthma    Back pain    Bone spur of foot    Chest pain    Chronic fatigue syndrome    Complication of anesthesia    woke up during sugery once, and also with epidural at [facility] [address] had episode with epidural   Depression    Dyspnea    Dysrhythmia    AFIB   Fibromyalgia    GERD (gastroesophageal reflux disease)    Headache    History of gout    History of kidney stones    Hypertension    IBS (irritable bowel syndrome)    Joint pain    Left ankle pain    Left sciatic nerve pain    Myofascial pain    Neck pain    OSA (obstructive sleep apnea)    cpap   Palpitations    Rheumatoid arthritis (HCC)  Seasonal allergies    Past Surgical History:  Procedure Laterality Date   BILATERAL KNEE ARTHROSCOPY     CARPAL TUNNEL RELEASE Left    CYST EXCISION     "little cysts taken off both hands and left arm" (06/04/2017)   KNEE ARTHROSCOPY Bilateral    "3 on my left; 2 on my right" (06/04/2017)   KNEE SURGERY     LAPAROSCOPIC CHOLECYSTECTOMY     TONSILLECTOMY     TOTAL KNEE ARTHROPLASTY Right 11/24/2021   Procedure: TOTAL KNEE ARTHROPLASTY;  Surgeon: Ollen Gross, MD;  Location: [facility] ORS;  Service: Orthopedics;  Laterality: Right;     A IV Location/Drains/Wounds Patient Lines/Drains/Airways Status     Active Line/Drains/Airways     Name Placement date Placement time Site Days   Peripheral IV 09/27/23 20 G Posterior;Right Forearm 09/27/23  0331  Forearm  less than 1            Intake/Output Last 24 hours  Intake/Output Summary (Last 24  hours) at 09/27/2023 1534 Last data filed at 09/27/2023 1531 Gross per 24 hour  Intake 57.6 ml  Output --  Net 57.6 ml    Labs/Imaging Results for orders placed or performed during the hospital encounter of 09/27/23 (from the past 48 hour(s))  Basic metabolic panel     Status: Abnormal   Collection Time: 09/27/23  3:33 AM  Result Value Ref Range   Sodium 142 135 - 145 mmol/L   Potassium 3.6 3.5 - 5.1 mmol/L   Chloride 109 98 - 111 mmol/L   CO2 21 (L) 22 - 32 mmol/L   Glucose, Bld 198 (H) 70 - 99 mg/dL    Comment: Glucose reference range applies only to samples taken after fasting for at least 8 hours.   BUN 21 (H) 6 - 20 mg/dL   Creatinine, Ser 7.82 0.44 - 1.00 mg/dL   Calcium 9.8 8.9 - 95.6 mg/dL   GFR, Estimated >21 >30 mL/min    Comment: (NOTE) Calculated using the CKD-EPI Creatinine Equation (2021)    Anion gap 12 5 - 15    Comment: Performed at [facility], [address], [place], [place] [address]  Magnesium     Status: None   Collection Time: 09/27/23  3:33 AM  Result Value Ref Range   Magnesium 2.0 1.7 - 2.4 mg/dL    Comment: Performed at [facility], [address], [place], [place] [address]  CBC     Status: None   Collection Time: 09/27/23  3:33 AM  Result Value Ref Range   WBC 8.2 4.0 - 10.5 K/uL   RBC 4.25 3.87 - 5.11 MIL/uL   Hemoglobin 12.7 12.0 - 15.0 g/dL   HCT 95.2 84.1 - 32.4 %   MCV 91.1 80.0 - 100.0 fL   MCH 29.9 26.0 - 34.0 pg   MCHC 32.8 30.0 - 36.0 g/dL   RDW 40.1 02.7 - 25.3 %   Platelets 189 150 - 400 K/uL   nRBC 0.0 0.0 - 0.2 %    Comment: Performed at [facility], [address] [place], [place] [address]  TSH     Status: Abnormal   Collection Time: 09/27/23  3:33 AM  Result Value Ref Range   TSH 0.135 (L) 0.350 - 4.500 uIU/mL    Comment: Performed by a 3rd Generation assay with a functional sensitivity of <=0.01 uIU/mL. Performed at [facility], [address], [place], [place] [address]  Hemoglobin A1c      Status: None   Collection Time: 09/27/23  3:33 AM  Result Value Ref Range   Hgb A1c MFr Bld 5.2 4.8 - 5.6 %    Comment: (NOTE) Pre diabetes:          5.7%-6.4%  Diabetes:              >6.4%  Glycemic control for   <7.0% adults with diabetes    Mean Plasma Glucose 102.54 mg/dL    Comment: Performed at [facility], [address], [place], [place] [address]  Troponin I (High Sensitivity)     Status: Abnormal   Collection Time: 09/27/23  9:04 AM  Result Value Ref Range   Troponin I (High Sensitivity) 72 (H) <18 ng/L    Comment: (NOTE) Elevated high sensitivity troponin I (hsTnI) values and significant  changes across serial measurements may suggest ACS but many other  chronic and acute conditions are known to elevate hsTnI results.  Refer to the "Links" section for chest pain algorithms and additional  guidance. Performed at [facility], [address] [place], [place] [address]   D-dimer, quantitative     Status: None   Collection Time: 09/27/23  9:04 AM  Result Value Ref Range   D-Dimer, Quant <0.27 0.00 - 0.50 ug/mL-FEU    Comment: (NOTE) At the manufacturer cut-off value of 0.5 g/mL FEU, this assay has a negative predictive value of 95-100%.This assay is intended for use in conjunction with a clinical pretest probability (PTP) assessment model to exclude pulmonary embolism (PE) and deep venous thrombosis (DVT) in outpatients suspected of PE or DVT. Results should be correlated with clinical presentation. Performed at [facility], [address] [place], [place] [address]    ECHOCARDIOGRAM COMPLETE  Result Date: 09/27/2023    ECHOCARDIOGRAM REPORT   Patient Name:   Dawn Jordan Date of Exam: 09/27/2023 Medical Rec #:  [id number]   Height:       65.0 in Accession #:    [id number]  Weight:       248.0 lb Date of Birth:  1966-03-03  BSA:          2.168 m Patient Age:    56 years    BP:           119/67 mmHg Patient Gender: F           HR:            67 bpm. Exam Location:  Inpatient Procedure: 2D Echo, Cardiac Doppler and Color Doppler Indications:    Atrial Fibrillation I48.91  History:        Patient has prior history of Echocardiogram examinations, most                 recent 06/05/2017. Arrythmias:Atrial Fibrillation,                 Signs/Symptoms:Chest Pain and Dyspnea; Risk Factors:Sleep Apnea                 and Dyslipidemia.  Sonographer:    Lucendia Herrlich RCS Referring Phys: Angie Fava IMPRESSIONS  1. Left ventricular ejection fraction, by estimation, is 60 to 65%. The left ventricle has normal function. The left ventricle has no regional wall motion abnormalities. Left ventricular diastolic parameters were normal.  2. Right ventricular systolic function is normal. The right ventricular size is normal. There is normal pulmonary artery systolic pressure. The estimated right ventricular  systolic pressure is 21.8 mmHg.  3. The mitral valve is normal in structure. No evidence of mitral valve regurgitation. No evidence of mitral stenosis.  4. The aortic valve is tricuspid. There is mild calcification of the aortic valve. Aortic valve regurgitation is not visualized. No aortic stenosis is present.  5. Aortic dilatation noted. There is borderline dilatation of the ascending aorta, measuring 37 mm.  6. The inferior vena cava is normal in size with greater than 50% respiratory variability, suggesting right atrial pressure of 3 mmHg. FINDINGS  Left Ventricle: Left ventricular ejection fraction, by estimation, is 60 to 65%. The left ventricle has normal function. The left ventricle has no regional wall motion abnormalities. The left ventricular internal cavity size was normal in size. There is  no left ventricular hypertrophy. Left ventricular diastolic parameters were normal. Right Ventricle: The right ventricular size is normal. No increase in right ventricular wall thickness. Right ventricular systolic function is normal. There is normal pulmonary  artery systolic pressure. The tricuspid regurgitant velocity is 2.17 m/s, and  with an assumed right atrial pressure of 3 mmHg, the estimated right ventricular systolic pressure is 21.8 mmHg. Left Atrium: Left atrial size was normal in size. Right Atrium: Right atrial size was normal in size. Pericardium: There is no evidence of pericardial effusion. Mitral Valve: The mitral valve is normal in structure. No evidence of mitral valve regurgitation. No evidence of mitral valve stenosis. Tricuspid Valve: The tricuspid valve is normal in structure. Tricuspid valve regurgitation is trivial. Aortic Valve: The aortic valve is tricuspid. There is mild calcification of the aortic valve. Aortic valve regurgitation is not visualized. No aortic stenosis is present. Aortic valve peak gradient measures 13.7 mmHg. Pulmonic Valve: The pulmonic valve was normal in structure. Pulmonic valve regurgitation is not visualized. Aorta: The aortic root is normal in size and structure and aortic dilatation noted. There is borderline dilatation of the ascending aorta, measuring 37 mm. Venous: The inferior vena cava is normal in size with greater than 50% respiratory variability, suggesting right atrial pressure of 3 mmHg. IAS/Shunts: No atrial level shunt detected by color flow Doppler.  LEFT VENTRICLE PLAX 2D LVIDd:         4.60 cm   Diastology LVIDs:         2.80 cm   LV e' medial:    12.60 cm/s LV PW:         1.10 cm   LV E/e' medial:  7.9 LV IVS:        1.00 cm   LV e' lateral:   11.70 cm/s LVOT diam:     1.90 cm   LV E/e' lateral: 8.5 LV SV:         58 LV SV Index:   27 LVOT Area:     2.84 cm  RIGHT VENTRICLE             IVC RV S prime:     16.50 cm/s  IVC diam: 1.50 cm TAPSE (M-mode): 2.3 cm LEFT ATRIUM             Index        RIGHT ATRIUM           Index LA diam:        3.60 cm 1.66 cm/m   RA Area:     11.50 cm LA Vol (A2C):   43.0 ml 19.82 ml/m  RA Volume:   23.40 ml  10.80 ml/m LA Vol (A4C):   65.3 ml  30.13 ml/m LA Biplane Vol:  60.3 ml 27.82 ml/m  AORTIC VALVE AV Area (Vmax): 1.69 cm AV Vmax:        185.00 cm/s AV Peak Grad:   13.7 mmHg LVOT Vmax:      110.00 cm/s LVOT Vmean:     71.725 cm/s LVOT VTI:       0.205 m  AORTA Ao Root diam: 3.20 cm Ao Asc diam:  3.70 cm MITRAL VALVE               TRICUSPID VALVE MV Area (PHT): 3.72 cm    TR Peak grad:   18.8 mmHg MV Decel Time: 204 msec    TR Vmax:        217.00 cm/s MV E velocity: 99.40 cm/s MV A velocity: 94.50 cm/s  SHUNTS MV E/A ratio:  1.05        Systemic VTI:  0.21 m                            Systemic Diam: 1.90 cm Dalton McleanMD Electronically signed by Wilfred Lacy Signature Date/Time: 09/27/2023/2:10:10 PM    Final    DG Chest 1 View  Result Date: 09/27/2023 CLINICAL DATA:  Atrial fibrillation.  Irregular heartbeat. EXAM: CHEST  1 VIEW COMPARISON:  11/16/2022 FINDINGS: Heart size and pulmonary vascularity are normal for technique. Lungs are clear. No pleural effusions. No pneumothorax. Mediastinal contours appear intact. Degenerative changes in the spine. IMPRESSION: No active disease. Electronically Signed   By: Burman Nieves M.D.   On: 09/27/2023 03:59    Pending Labs Unresulted Labs (From admission, onward)     Start     Ordered   09/28/23 0500  Basic metabolic panel  Tomorrow morning,   R        09/27/23 0745   09/28/23 0500  CBC  Tomorrow morning,   R        09/27/23 0745   09/27/23 1200  T4, free  Once,   R        09/27/23 1159            Vitals/Pain Today's Vitals   09/27/23 1122 09/27/23 1200 09/27/23 1330 09/27/23 1510  BP:  125/66 117/74   Pulse:  71 63   Resp:  14 14   Temp: (!) 97.5 F (36.4 C)   98.2 F (36.8 C)  TempSrc: Oral   Oral  SpO2:  98% 99%   PainSc:        Isolation Precautions No active isolations  Medications Medications  acetaminophen (TYLENOL) tablet 650 mg (has no administration in time range)    Or  acetaminophen (TYLENOL) suppository 650 mg (has no administration in time range)  ondansetron (ZOFRAN)  tablet 4 mg (has no administration in time range)    Or  ondansetron (ZOFRAN) injection 4 mg (has no administration in time range)  albuterol (PROVENTIL) (2.5 MG/3ML) 0.083% nebulizer solution 2.5 mg (has no administration in time range)  nabumetone (RELAFEN) tablet 500 mg (has no administration in time range)  pantoprazole (PROTONIX) EC tablet 40 mg (40 mg Oral Given 09/27/23 1216)  clonazePAM (KLONOPIN) tablet 0.5 mg (has no administration in time range)  gabapentin (NEURONTIN) capsule 300 mg (has no administration in time range)  loratadine (CLARITIN) tablet 10 mg (has no administration in time range)  montelukast (SINGULAIR) tablet 10 mg (10 mg Oral Given 09/27/23 1216)  flecainide (TAMBOCOR) tablet 50 mg (has no administration in  time range)  metoprolol succinate (TOPROL-XL) 24 hr tablet 12.5 mg (has no administration in time range)  apixaban (ELIQUIS) tablet 5 mg (has no administration in time range)  potassium chloride SA (KLOR-CON M) CR tablet 40 mEq (40 mEq Oral Given 09/27/23 0654)    Mobility walks     Focused Assessments Cardiac Assessment Handoff:  Cardiac Rhythm: Normal sinus rhythm Lab Results  Component Value Date   TROPONINI <0.03 06/05/2017   Lab Results  Component Value Date   DDIMER <0.27 09/27/2023   Does the Patient currently have chest pain? No    R Recommendations: See Admitting Provider Note  Report given to:   Additional Notes:

## 2023-09-27 NOTE — Progress Notes (Signed)
Echocardiogram 2D Echocardiogram has been performed.  Dawn Jordan 09/27/2023, 2:04 PM

## 2023-09-27 NOTE — ED Triage Notes (Signed)
Patient BIB EMS for evaluation of chest discomfort and heart palpations.  Reports she was awaken from sleep and felt like her heart was racing.  Reports increased stress at home and currently moving.  Took ASA and nitroglycerin SL x 3 prior to calling EMS.  EMS noted patient with irregular rhythm.  Given Cardizem 20 mg IV and another dose of Cardizem 25 mg IV prior to arrival.   Pt has no chest pain at this time.  States "its only a tightness."

## 2023-09-27 NOTE — Progress Notes (Signed)
  Carryover admission to the Day Admitter.  I discussed this case with the EDP, Dr. Eudelia Bunch.  Per these discussions:   This is a 57 year old female with history of paroxysmal atrial fibrillation, who is being admitted this evening with atrial fibrillation with RVR after presenting with intermittent palpitations over the course of the last 3 days.  She conveys a known history of paroxysmal atrial fibrillation, and states that her prior flecainide and metoprolol were weaned off a few months ago.  She is now not on any antiarrhythmic or AV nodal blocking agents.  In the setting of her ongoing palpitations, contacted EMS this evening, who noted heart rates in the 190s to low 200s, and found to be in A-fib.  Received normal pushes of IV diltiazem via EMS, and was subsequently started on diltiazem drip in the emergency department, with improving heart rates, now into the low 100s.  Blood pressures been tolerating.  Presentation associated with any chest pain.  Most recent echocardiogram occurred in 2018.  Presenting labs notable for serum potassium of 3.6 as well as magnesium level 2.0.    I have placed an order for  Observation to cardiac telemetry for further evaluation management of the above.  I have placed some additional preliminary admit orders via the adult multi-morbid admission order set. I have also ordered continuation of existing diltiazem drip, echocardiogram in the morning, and ordered potassium chloride 40 meq p.o. x 1 dose now.    Newton Pigg, DO Hospitalist

## 2023-09-27 NOTE — Consult Note (Signed)
Cardiology Consultation   Patient ID: Dawn Jordan MRN: [id number]; DOB: May 24, 1966  Admit date: 09/27/2023 Date of Consult: 09/27/2023  PCP:  [place], Betty G, MD   [facility] Providers Cardiologist:  Donato Schultz, MD    Patient Profile:   Dawn Jordan is a 57 y.o. adult with a hx of paroxsymal atrial fibrillation, MDD, anxiety, OSA, RA, obesity who is being seen 09/27/2023 for the evaluation of atrial fibrillation at the request of Dr. Katrinka Blazing.  History of Present Illness:   Dawn Jordan is a 57 yo with PMH noted above who has been followed by Dr. Anne Fu as an outpatient.  Previously followed by cardiology in [place] where they underwent nuclear stress testing which was reassuring.  Underwent stress test 02/2019 which was low risk with no ischemia and LVEF of 61%.  Wore a 7-day monitor with 2 hours of atrial fibrillation with RVR, occasional PVCs and PACs.  CHA2DS2-VASc score was calculated at 1 therefore did not meet criteria for anticoagulation.  No known history of CAD on coronary CT and placed on flecainide 50 mg twice daily as long with metoprolol 25 mg daily to suppress atrial fibrillation.  Underwent exercise tolerance test after 2 weeks of being on flecainide with no EKG changes.  Last seen in the office on 12/2022 with Dr. Anne Fu.  At that visit reported a recent episode of squeezing chest discomfort and palpitations.  EKG was noted to be reassuring in the office that day.  Set up for outpatient PET scan which was negative for ischemia, low risk study.  Wore cardiac monitor 06/2023 which showed no evidence of atrial fibrillation and patient wished to be weaned from flecainide and metoprolol.  Presented to the ED on 10/21 with complaints of chest pain that woke her from sleep. Dawn Jordan reports intense chest pain around 1am, took SL nitroglycerin without relief. Felt heart racing, short of breath. Daughter called 911. On EMS arrival was found to have atrial fibrillation with RVR with  rate of 172 bpm. Dawn Jordan reports HR up to 202, attempted vagal maneuvers without improvement. Given Cardizem 20 mg bolus, followed by an additional 25 mg bolus with improvement in rates.  Labs in the ED showed sodium 142, potassium 3.6, creatinine 0.92, high-sensitivity troponin 72, WBC 8.2, hemoglobin 12.7, TSH 0.135, hemoglobin A1c 5.2.  EKG showed atrial fibrillation, 99 bpm.  Placed on IV diltiazem.  Admitted to internal medicine for further management.  Review of telemetry notes patient back in sinus rhythm as of 7 AM, Dawn Jordan reports feeling much better since conversion.  No further episodes of chest pain, shortness of breath.   Past Medical History:  Diagnosis Date   A-fib [facility])    ADD (attention deficit disorder)    Anemia    Anginal pain (HCC)    Anxiety    Asthma    Back pain    Bone spur of foot    Chest pain    Chronic fatigue syndrome    Complication of anesthesia    woke up during sugery once, and also with epidural at surgery center on [address] had episode with epidural   Depression    Dyspnea    Dysrhythmia    AFIB   Fibromyalgia    GERD (gastroesophageal reflux disease)    Headache    History of gout    History of kidney stones    Hypertension    IBS (irritable bowel syndrome)    Joint pain    Left ankle pain  Left sciatic nerve pain    Myofascial pain    Neck pain    OSA (obstructive sleep apnea)    cpap   Palpitations    Rheumatoid arthritis (HCC)    Seasonal allergies     Past Surgical History:  Procedure Laterality Date   BILATERAL KNEE ARTHROSCOPY     CARPAL TUNNEL RELEASE Left    CYST EXCISION     "little cysts taken off both hands and left arm" (06/04/2017)   KNEE ARTHROSCOPY Bilateral    "3 on my left; 2 on my right" (06/04/2017)   KNEE SURGERY     LAPAROSCOPIC CHOLECYSTECTOMY     TONSILLECTOMY     TOTAL KNEE ARTHROPLASTY Right 11/24/2021   Procedure: TOTAL KNEE ARTHROPLASTY;  Surgeon: Ollen Gross, MD;  Location: [facility];  Service:  Orthopedics;  Laterality: Right;      Inpatient Medications: Scheduled Meds:  enoxaparin (LOVENOX) injection  40 mg Subcutaneous Q24H   gabapentin  300 mg Oral QHS   loratadine  10 mg Oral QPM   montelukast  10 mg Oral Daily   nabumetone  500 mg Oral QHS   pantoprazole  40 mg Oral Daily   Continuous Infusions:  sodium chloride     diltiazem (CARDIZEM) infusion 5 mg/hr (09/27/23 1203)   PRN Meds: acetaminophen **OR** acetaminophen, albuterol, clonazePAM, ondansetron **OR** ondansetron (ZOFRAN) IV  Allergies:    Allergies  Allergen Reactions   Almond (Diagnostic) Anaphylaxis   Egg-Derived Products Diarrhea and Other (See Comments)    GI intolerance    Latex Anaphylaxis, Hives and Itching    "Anaphylaxis is only when I am in a closed area (ex: car with balloons)"   Other Other (See Comments)    Sinus headache from new plastics, carpets, etc.   Banana Other (See Comments)    Mouth burning   Food Other (See Comments)    Dates - Mouth burning   Kiwi Extract Other (See Comments)    Mouth burning   Norco [Hydrocodone-Acetaminophen] Itching    OK with Benadryl premed   Oxycontin [Oxycodone] Itching    OK with Benadryl premed   Codeine Other (See Comments)    Headaches    Wound Dressing Adhesive Itching and Rash    Blistering rash    Social History:   Social History   Socioeconomic History   Marital status: Married    Spouse name: Dawn Jordan   Number of children: 0   Years of education: Not on file   Highest education level: Not on file  Occupational History   Occupation: not employed-disabled  Tobacco Use   Smoking status: Never   Smokeless tobacco: Never  Vaping Use   Vaping status: Former  Substance and Sexual Activity   Alcohol use: No   Drug use: No   Sexual activity: Never  Other Topics Concern   Not on file  Social History Narrative   Born in [place], [place]   Unemployed, on disability since 1991 for anxiety,    Education:  [employer]   Single, no children, lives with her friend (used to live with her mother with stroke, now in ALF)      Social Determinants of Health   Financial Resource Strain: Low Risk  (05/31/2023)   Overall Financial Resource Strain (CARDIA)    Difficulty of Paying Living Expenses: Not hard at all  Food Insecurity: No Food Insecurity (09/27/2023)   Hunger Vital Sign    Worried About Running Out of Food  in the Last Year: Never true    Ran Out of Food in the Last Year: Never true  Transportation Needs: No Transportation Needs (09/27/2023)   PRAPARE - Administrator, Civil Service (Medical): No    Lack of Transportation (Non-Medical): No  Physical Activity: Inactive (05/31/2023)   Exercise Vital Sign    Days of Exercise per Week: 0 days    Minutes of Exercise per Session: 0 min  Stress: No Stress Concern Present (05/31/2023)   [employer] of Occupational Health - Occupational Stress Questionnaire    Feeling of Stress : Not at all  Social Connections: Socially Integrated (05/31/2023)   Social Connection and Isolation Panel [NHANES]    Frequency of Communication with Friends and Family: More than three times a week    Frequency of Social Gatherings with Friends and Family: More than three times a week    Attends Religious Services: More than 4 times per year    Active Member of [employer] or Organizations: Yes    Attends Engineer, structural: More than 4 times per year    Marital Status: Married  Catering manager Violence: Not At Risk (09/27/2023)   Humiliation, Afraid, Rape, and Kick questionnaire    Fear of Current or Ex-Partner: No    Emotionally Abused: No    Physically Abused: No    Sexually Abused: No    Family History:    Family History  Problem Relation Age of Onset   Arthritis Mother    Hyperlipidemia Mother    Hypertension Mother    Stroke Mother    Mental retardation Mother    Diabetes Mother    Allergic rhinitis Mother    Heart  disease Mother    Thyroid disease Mother    Depression Mother    Anxiety disorder Mother    Bipolar disorder Mother    Sleep apnea Mother    Eating disorder Mother    Heart attack Brother 30       After playing hockey   Allergic rhinitis Brother    Diabetes Maternal Grandfather    Hypertension Maternal Grandfather    Diabetes Paternal Grandmother    Hypertension Paternal Grandmother    Cancer Paternal Grandmother        breast   Lung cancer Maternal Uncle    Colon cancer Paternal Aunt    Lung cancer Other    Asthma Neg Hx    Eczema Neg Hx    Immunodeficiency Neg Hx    Urticaria Neg Hx    Atopy Neg Hx    Angioedema Neg Hx    Esophageal cancer Neg Hx    Stomach cancer Neg Hx    Rectal cancer Neg Hx      ROS:  Please see the history of present illness.   All other ROS reviewed and negative.     Physical Exam/Data:   Vitals:   09/27/23 0730 09/27/23 1103 09/27/23 1122 09/27/23 1200  BP: 105/63 107/67  125/66  Pulse: 64 70  71  Resp: 16 19  14   Temp: 98.1 F (36.7 C)  (!) 97.5 F (36.4 C)   TempSrc: Oral  Oral   SpO2: 100% 99%  98%    Intake/Output Summary (Last 24 hours) at 09/27/2023 1506 Last data filed at 09/27/2023 1203 Gross per 24 hour  Intake 40.32 ml  Output --  Net 40.32 ml      05/31/2023    2:04 PM 12/28/2022    3:04 PM 12/15/2022  8:56 AM  Last 3 Weights  Weight (lbs) 248 lb 248 lb 12.8 oz 251 lb  Weight (kg) 112.492 kg 112.855 kg 113.853 kg     There is no height or weight on file to calculate BMI.  General:  Well nourished, well developed, in no acute distress HEENT: normal Neck: no JVD Vascular: No carotid bruits; Distal pulses 2+ bilaterally Cardiac:  normal S1, S2; RRR; no murmur  Lungs:  clear to auscultation bilaterally, no wheezing, rhonchi or rales  Abd: soft, nontender, no hepatomegaly  Ext: no edema Musculoskeletal:  No deformities, BUE and BLE strength normal and equal Skin: warm and dry  Neuro:  CNs 2-12 intact, no focal  abnormalities noted Psych:  Normal affect   EKG:  The EKG was personally reviewed and demonstrates: Atrial fibrillation, 99 bpm Telemetry:  Telemetry was personally reviewed and demonstrates: Sinus rhythm as of 7 AM  Relevant CV Studies:  Echo: 09/2023  IMPRESSIONS     1. Left ventricular ejection fraction, by estimation, is 60 to 65%. The  left ventricle has normal function. The left ventricle has no regional  wall motion abnormalities. Left ventricular diastolic parameters were  normal.   2. Right ventricular systolic function is normal. The right ventricular  size is normal. There is normal pulmonary artery systolic pressure. The  estimated right ventricular systolic pressure is 21.8 mmHg.   3. The mitral valve is normal in structure. No evidence of mitral valve  regurgitation. No evidence of mitral stenosis.   4. The aortic valve is tricuspid. There is mild calcification of the  aortic valve. Aortic valve regurgitation is not visualized. No aortic  stenosis is present.   5. Aortic dilatation noted. There is borderline dilatation of the  ascending aorta, measuring 37 mm.   6. The inferior vena cava is normal in size with greater than 50%  respiratory variability, suggesting right atrial pressure of 3 mmHg.   FINDINGS   Left Ventricle: Left ventricular ejection fraction, by estimation, is 60  to 65%. The left ventricle has normal function. The left ventricle has no  regional wall motion abnormalities. The left ventricular internal cavity  size was normal in size. There is   no left ventricular hypertrophy. Left ventricular diastolic parameters  were normal.   Right Ventricle: The right ventricular size is normal. No increase in  right ventricular wall thickness. Right ventricular systolic function is  normal. There is normal pulmonary artery systolic pressure. The tricuspid  regurgitant velocity is 2.17 m/s, and   with an assumed right atrial pressure of 3 mmHg, the  estimated right  ventricular systolic pressure is 21.8 mmHg.   Left Atrium: Left atrial size was normal in size.   Right Atrium: Right atrial size was normal in size.   Pericardium: There is no evidence of pericardial effusion.   Mitral Valve: The mitral valve is normal in structure. No evidence of  mitral valve regurgitation. No evidence of mitral valve stenosis.   Tricuspid Valve: The tricuspid valve is normal in structure. Tricuspid  valve regurgitation is trivial.   Aortic Valve: The aortic valve is tricuspid. There is mild calcification  of the aortic valve. Aortic valve regurgitation is not visualized. No  aortic stenosis is present. Aortic valve peak gradient measures 13.7 mmHg.   Pulmonic Valve: The pulmonic valve was normal in structure. Pulmonic valve  regurgitation is not visualized.   Aorta: The aortic root is normal in size and structure and aortic  dilatation noted. There is borderline  dilatation of the ascending aorta,  measuring 37 mm.   Venous: The inferior vena cava is normal in size with greater than 50%  respiratory variability, suggesting right atrial pressure of 3 mmHg.   IAS/Shunts: No atrial level shunt detected by color flow Doppler.    Laboratory Data:  High Sensitivity Troponin:   Recent Labs  Lab 09/27/23 0904  TROPONINIHS 72*     Chemistry Recent Labs  Lab 09/27/23 0333  NA 142  K 3.6  CL 109  CO2 21*  GLUCOSE 198*  BUN 21*  CREATININE 0.92  CALCIUM 9.8  MG 2.0  GFRNONAA >60  ANIONGAP 12    No results for input(s): "PROT", "ALBUMIN", "AST", "ALT", "ALKPHOS", "BILITOT" in the last 168 hours. Lipids No results for input(s): "CHOL", "TRIG", "HDL", "LABVLDL", "LDLCALC", "CHOLHDL" in the last 168 hours.  Hematology Recent Labs  Lab 09/27/23 0333  WBC 8.2  RBC 4.25  HGB 12.7  HCT 38.7  MCV 91.1  MCH 29.9  MCHC 32.8  RDW 12.9  PLT 189   Thyroid  Recent Labs  Lab 09/27/23 0333  TSH 0.135*    BNPNo results for input(s):  "BNP", "PROBNP" in the last 168 hours.  DDimer  Recent Labs  Lab 09/27/23 1610  DDIMER <0.27     Radiology/Studies:  ECHOCARDIOGRAM COMPLETE  Result Date: 09/27/2023    ECHOCARDIOGRAM REPORT   Patient Name:   Dawn Jordan Date of Exam: 09/27/2023 Medical Rec #:  [id number]   Height:       65.0 in Accession #:    [id number]  Weight:       248.0 lb Date of Birth:  [birth year]  BSA:          2.168 m Patient Age:    56 years    BP:           119/67 mmHg Patient Gender: F           HR:           67 bpm. Exam Location:  Inpatient Procedure: 2D Echo, Cardiac Doppler and Color Doppler Indications:    Atrial Fibrillation I48.91  History:        Patient has prior history of Echocardiogram examinations, most                 recent 06/05/2017. Arrythmias:Atrial Fibrillation,                 Signs/Symptoms:Chest Pain and Dyspnea; Risk Factors:Sleep Apnea                 and Dyslipidemia.  Sonographer:    Lucendia Herrlich RCS Referring Phys: Angie Fava IMPRESSIONS  1. Left ventricular ejection fraction, by estimation, is 60 to 65%. The left ventricle has normal function. The left ventricle has no regional wall motion abnormalities. Left ventricular diastolic parameters were normal.  2. Right ventricular systolic function is normal. The right ventricular size is normal. There is normal pulmonary artery systolic pressure. The estimated right ventricular systolic pressure is 21.8 mmHg.  3. The mitral valve is normal in structure. No evidence of mitral valve regurgitation. No evidence of mitral stenosis.  4. The aortic valve is tricuspid. There is mild calcification of the aortic valve. Aortic valve regurgitation is not visualized. No aortic stenosis is present.  5. Aortic dilatation noted. There is borderline dilatation of the ascending aorta, measuring 37 mm.  6. The inferior vena cava is normal in size with greater than 50% respiratory variability, suggesting  right atrial pressure of 3 mmHg. FINDINGS  Left  Ventricle: Left ventricular ejection fraction, by estimation, is 60 to 65%. The left ventricle has normal function. The left ventricle has no regional wall motion abnormalities. The left ventricular internal cavity size was normal in size. There is  no left ventricular hypertrophy. Left ventricular diastolic parameters were normal. Right Ventricle: The right ventricular size is normal. No increase in right ventricular wall thickness. Right ventricular systolic function is normal. There is normal pulmonary artery systolic pressure. The tricuspid regurgitant velocity is 2.17 m/s, and  with an assumed right atrial pressure of 3 mmHg, the estimated right ventricular systolic pressure is 21.8 mmHg. Left Atrium: Left atrial size was normal in size. Right Atrium: Right atrial size was normal in size. Pericardium: There is no evidence of pericardial effusion. Mitral Valve: The mitral valve is normal in structure. No evidence of mitral valve regurgitation. No evidence of mitral valve stenosis. Tricuspid Valve: The tricuspid valve is normal in structure. Tricuspid valve regurgitation is trivial. Aortic Valve: The aortic valve is tricuspid. There is mild calcification of the aortic valve. Aortic valve regurgitation is not visualized. No aortic stenosis is present. Aortic valve peak gradient measures 13.7 mmHg. Pulmonic Valve: The pulmonic valve was normal in structure. Pulmonic valve regurgitation is not visualized. Aorta: The aortic root is normal in size and structure and aortic dilatation noted. There is borderline dilatation of the ascending aorta, measuring 37 mm. Venous: The inferior vena cava is normal in size with greater than 50% respiratory variability, suggesting right atrial pressure of 3 mmHg. IAS/Shunts: No atrial level shunt detected by color flow Doppler.  LEFT VENTRICLE PLAX 2D LVIDd:         4.60 cm   Diastology LVIDs:         2.80 cm   LV e' medial:    12.60 cm/s LV PW:         1.10 cm   LV E/e' medial:  7.9  LV IVS:        1.00 cm   LV e' lateral:   11.70 cm/s LVOT diam:     1.90 cm   LV E/e' lateral: 8.5 LV SV:         58 LV SV Index:   27 LVOT Area:     2.84 cm  RIGHT VENTRICLE             IVC RV S prime:     16.50 cm/s  IVC diam: 1.50 cm TAPSE (M-mode): 2.3 cm LEFT ATRIUM             Index        RIGHT ATRIUM           Index LA diam:        3.60 cm 1.66 cm/m   RA Area:     11.50 cm LA Vol (A2C):   43.0 ml 19.82 ml/m  RA Volume:   23.40 ml  10.80 ml/m LA Vol (A4C):   65.3 ml 30.13 ml/m LA Biplane Vol: 60.3 ml 27.82 ml/m  AORTIC VALVE AV Area (Vmax): 1.69 cm AV Vmax:        185.00 cm/s AV Peak Grad:   13.7 mmHg LVOT Vmax:      110.00 cm/s LVOT Vmean:     71.725 cm/s LVOT VTI:       0.205 m  AORTA Ao Root diam: 3.20 cm Ao Asc diam:  3.70 cm MITRAL VALVE  TRICUSPID VALVE MV Area (PHT): 3.72 cm    TR Peak grad:   18.8 mmHg MV Decel Time: 204 msec    TR Vmax:        217.00 cm/s MV E velocity: 99.40 cm/s MV A velocity: 94.50 cm/s  SHUNTS MV E/A ratio:  1.05        Systemic VTI:  0.21 m                            Systemic Diam: 1.90 cm Dawn McleanMD Electronically signed by Wilfred Lacy Signature Date/Time: 09/27/2023/2:10:10 PM    Final    DG Chest 1 View  Result Date: 09/27/2023 CLINICAL DATA:  Atrial fibrillation.  Irregular heartbeat. EXAM: CHEST  1 VIEW COMPARISON:  11/16/2022 FINDINGS: Heart size and pulmonary vascularity are normal for technique. Lungs are clear. No pleural effusions. No pneumothorax. Mediastinal contours appear intact. Degenerative changes in the spine. IMPRESSION: No active disease. Electronically Signed   By: Burman Nieves M.D.   On: 09/27/2023 03:59     Assessment and Plan:   Dawn Jordan is a 57 y.o. adult with a hx of paroxsymal atrial fibrillation, MDD, anxiety, OSA, RA, obesity who is being seen 09/27/2023 for the evaluation of atrial fibrillation at the request of Dr. Katrinka Blazing.  Atrial fibrillation RVR -- Does have history of paroxysmal atrial  fibrillation previously on metoprolol and flecainide --Initially had rapid rates per EMS in the 200 range, has since converted to sinus rhythm on IV diltiazem --TSH 0.135 (which is new) question whether this is contributing.  Also history of OSA but has not been able to tolerate CPAP -- echo with LVEF of 60-65%, normal RV -- Will transition back to flecainide 50 mg twice daily as well as metoprolol 12.5 mg daily -- CHA2DS2-VASc score remains at 1 (female), denies any history of hypertension BPs have been controlled here -- Will plan for Eliquis 5 mg twice daily for 1 month -- Patient is willing to revisit OSA therapy outpatient -- Of note, patient in the process of moving to [facility].  Discussed [facility] will need to follow-up and establish with cardiology there for ongoing management  Abnormal TSH -- TSH 0.135 -- Check free T4  Per primary Fibromyalgia Rheumatoid arthritis GERD Anxiety MDD   Risk Assessment/Risk Scores:   CHA2DS2-VASc Score = 1  This indicates a 0.6% annual risk of stroke. The patient's score is based upon: CHF History: 0 HTN History: 0 Diabetes History: 0 Stroke History: 0 Vascular Disease History: 0 Age Score: 0 Gender Score: 1    For questions or updates, please contact [facility] Please consult [email or web address] for contact info under    Signed, Laverda Page, NP  09/27/2023 3:06 PM

## 2023-09-27 NOTE — ED Provider Notes (Signed)
[place] EMERGENCY DEPARTMENT AT [facility] Provider Note  CSN: [id number] Arrival date & time: 09/27/23 4098  Chief Complaint(s) Irregular Heart Beat  HPI Dawn Jordan is a 57 y.o. adult with a past medical history listed below including atrial fibrillation previously on flecainide and metoprolol who was weaned off by cardiology several months ago presents to the emergency department with chest discomfort and noted to be in A-fib RVR by EMS.  She reports that she has been having intermittent palpitations which initially were thought to be anxiety related.  Patient denies any recent fevers or infections.  No cough or congestion.  No nausea or vomiting.  She did report having increased rheumatoid arthritis pain and reports taking dose of her prednisone yesterday.  Patient took ASA and NTG at home w/o relief.  EMS noted patient's heart rate in the 200s.  Patient was given initially 20 mg of Cardizem with mild improvement.  25 mg of Cardizem given resulting in improved heart rates to the low 100s. Chest pain resolved  HPI  Past Medical History Past Medical History:  Diagnosis Date   A-fib (HCC)    ADD (attention deficit disorder)    Anemia    Anginal pain (HCC)    Anxiety    Asthma    Back pain    Bone spur of foot    Chest pain    Chronic fatigue syndrome    Complication of anesthesia    woke up during sugery once, and also with epidural at surgery center on [address] had episode with epidural   Depression    Dyspnea    Dysrhythmia    AFIB   Fibromyalgia    GERD (gastroesophageal reflux disease)    Headache    History of gout    History of kidney stones    Hypertension    IBS (irritable bowel syndrome)    Joint pain    Left ankle pain    Left sciatic nerve pain    Myofascial pain    Neck pain    OSA (obstructive sleep apnea)    cpap   Palpitations    Rheumatoid arthritis (HCC)    Seasonal allergies    Patient Active Problem List   Diagnosis Date  Noted   Atrial fibrillation with RVR (HCC) 09/27/2023   Prediabetes 12/15/2022   Hyperlipidemia 12/15/2022   MDD (major depressive disorder), recurrent episode, moderate (HCC) 12/15/2022   Acute cough 11/04/2022   OA (osteoarthritis) of knee 11/24/2021   Primary osteoarthritis of right knee 11/24/2021   Preop cardiovascular exam 09/04/2021   Primary osteoarthritis of both knees 04/29/2020   Morbid obesity (HCC) BMI 39 + depression,HLD,RA,GERD 12/04/2019   Myofascial pain dysfunction syndrome 08/31/2019   Paroxysmal atrial fibrillation (HCC) 08/31/2019   History of insect sting allergy 10/21/2018   Fire ant bite 10/21/2018   Seasonal and perennial allergic rhinitis 10/21/2018   Allergic contact dermatitis due to metals 10/21/2018   Asthma 06/04/2017   Chest pain of uncertain etiology 06/04/2017   Special screening for malignant neoplasms, colon 04/06/2017   Gastroesophageal reflux disease 04/06/2017   Abdominal pain, epigastric 04/06/2017   Chronic diarrhea 04/06/2017   OSA (obstructive sleep apnea) 03/30/2017   Major depressive disorder, single episode, moderate (HCC) 03/09/2017   Moderate persistent asthma without complication 01/28/2017   History of food allergy 01/28/2017   Dermatitis, contact 01/28/2017   Allergic rhinitis 12/22/2016   Fibromyalgia syndrome 09/21/2016   Attention deficit hyperactivity disorder (ADHD) 09/21/2016   PTSD (  post-traumatic stress disorder) 09/21/2016   Home Medication(s) Prior to Admission medications   Medication Sig Start Date End Date Taking? Authorizing Provider  acetaminophen (TYLENOL) 650 MG CR tablet Take 1,300 mg by mouth every 8 (eight) hours as needed for pain.    [provider]  ACTEMRA 162 MG/0.9ML SOSY Inject 162 mg as directed every Tuesday. 10/17/18   [provider]  atomoxetine (STRATTERA) 60 MG capsule Take 1 capsule (60 mg total) by mouth daily. 06/03/23 06/02/24  Oletta Darter, MD  clonazePAM (KLONOPIN) 0.5  MG tablet Take 1 tablet (0.5 mg total) by mouth 3 (three) times daily as needed for anxiety. 04/22/23   Oletta Darter, MD  diphenhydrAMINE (BENADRYL ALLERGY) 25 mg capsule Take 25-50mg  po qHS prn insomnia 04/22/23   Oletta Darter, MD  EPINEPHrine 0.3 mg/0.3 mL IJ SOAJ injection Use for life threatening allergic reactions 11/10/22   Ambs, Norvel Richards, FNP  flecainide (TAMBOCOR) 50 MG tablet TAKE ONE TABLET BY MOUTH TWICE DAILY 06/11/23   Jake Bathe, MD  fluticasone (FLONASE) 50 MCG/ACT nasal spray PLACE ONE SPRAY IN EACH NOSTRIL EVERY DAY 08/27/22   Ambs, Norvel Richards, FNP  fluticasone (FLOVENT HFA) 220 MCG/ACT inhaler INHALE TWO PUFFS INTO THE LUNGS EVERY MORNING & AT BEDTIME 06/02/22   Ellamae Sia, DO  levalbuterol [facility] HFA) 45 MCG/ACT inhaler INHALE TWO PUFFS INTO THE LUNGS EVERY 6 HOURS AS NEEDED FOR WHEEZING 03/09/22   Ellamae Sia, DO  levocetirizine (XYZAL) 5 MG tablet Take 1 tablet (5 mg total) by mouth every evening. 11/13/21   Ellamae Sia, DO  metoprolol succinate (TOPROL-XL) 50 MG 24 hr tablet Take 1 tablet (50 mg total) by mouth daily. Take with or immediately following a meal. 04/16/23 07/15/23  Jake Bathe, MD  montelukast (SINGULAIR) 10 MG tablet TAKE ONE TABLET BY MOUTH EVERY DAY 01/13/23   Ambs, Norvel Richards, FNP  nitroGLYCERIN (NITROSTAT) 0.4 MG SL tablet Place 1 tablet (0.4 mg total) under the tongue every 5 (five) minutes as needed for chest pain. 11/17/22   Smitty Knudsen, PA-C  Olopatadine HCl 0.2 % SOLN Place 1 drop into both eyes daily as needed. 11/13/21   Ellamae Sia, DO  Oxycodone HCl 10 MG TABS Take 10 mg by mouth daily as needed (pain).    [provider]  pantoprazole (PROTONIX) 40 MG tablet Take 1 tablet (40 mg total) by mouth daily. 04/16/23   [place], Betty G, MD  Polyvinyl Alcohol-Povidone (REFRESH OP) Place 1 drop into both eyes daily.    [provider]  pregabalin (LYRICA) 200 MG capsule Take 200 mg by mouth at bedtime.    [provider]  triamcinolone  ointment (KENALOG) 0.1 % Apply 1 application topically 2 (two) times daily. Patient taking differently: Apply 1 application  topically 2 (two) times daily as needed (eczema). 03/29/20   Hetty Blend, FNP  Vilazodone HCl (VIIBRYD) 40 MG TABS Take 1 tablet (40 mg total) by mouth daily. 06/03/23   Oletta Darter, MD  Allergies Almond (diagnostic), Egg-derived products, Latex, Other, Date seed extract [zizyphus jujuba], and Codeine  Review of Systems Review of Systems As noted in HPI  Physical Exam Vital Signs  I have reviewed the triage vital signs BP (!) 96/52   Pulse 62   Temp 98.3 F (36.8 C) (Oral)   Resp 12   LMP  (LMP Unknown)   SpO2 99%   Physical Exam Vitals reviewed.  Constitutional:      General: [place] is not in acute distress.    Appearance: [place] is well-developed. Mikia is not diaphoretic.  HENT:     Head: Normocephalic and atraumatic.     Nose: Nose normal.  Eyes:     General: No scleral icterus.       Right eye: No discharge.        Left eye: No discharge.     Conjunctiva/sclera: Conjunctivae normal.     Pupils: Pupils are equal, round, and reactive to light.  Cardiovascular:     Rate and Rhythm: Tachycardia present. Rhythm irregularly irregular.     Heart sounds: No murmur heard.    No friction rub. No gallop.  Pulmonary:     Effort: Pulmonary effort is normal. No respiratory distress.     Breath sounds: Normal breath sounds. No stridor. No rales.  Abdominal:     General: There is no distension.     Palpations: Abdomen is soft.     Tenderness: There is no abdominal tenderness.  Musculoskeletal:        General: No tenderness.     Cervical back: Normal range of motion and neck supple.  Skin:    General: Skin is warm and dry.     Findings: No erythema or rash.  Neurological:     Mental Status: Merve is alert and oriented to  person, place, and time.     ED Results and Treatments Labs (all labs ordered are listed, but only abnormal results are displayed) Labs Reviewed  BASIC METABOLIC PANEL - Abnormal; Notable for the following components:      Result Value   CO2 21 (*)    Glucose, Bld 198 (*)    BUN 21 (*)    All other components within normal limits  MAGNESIUM  CBC                                                                                                                         EKG  EKG Interpretation Date/Time:  Monday September 27 2023 03:23:36 EDT Ventricular Rate:  99 PR Interval:    QRS Duration:  93 QT Interval:  354 QTC Calculation: 455 R Axis:   -27  Text Interpretation: Atrial fibrillation Borderline left axis deviation Abnormal R-wave progression, early transition Borderline T wave abnormalities Confirmed by Drema Pry [phone]) on 09/27/2023 4:25:14 AM       Radiology DG Chest 1 View  Result Date: 09/27/2023 CLINICAL DATA:  Atrial fibrillation.  Irregular heartbeat. EXAM: CHEST  1 VIEW  COMPARISON:  11/16/2022 FINDINGS: Heart size and pulmonary vascularity are normal for technique. Lungs are clear. No pleural effusions. No pneumothorax. Mediastinal contours appear intact. Degenerative changes in the spine. IMPRESSION: No active disease. Electronically Signed   By: Burman Nieves M.D.   On: 09/27/2023 03:59    Medications Ordered in ED Medications  diltiazem (CARDIZEM) 125 mg in dextrose 5% 125 mL (1 mg/mL) infusion (5 mg/hr Intravenous New Bag/Given 09/27/23 0358)  acetaminophen (TYLENOL) tablet 650 mg (has no administration in time range)    Or  acetaminophen (TYLENOL) suppository 650 mg (has no administration in time range)  potassium chloride SA (KLOR-CON M) CR tablet 40 mEq (has no administration in time range)   Procedures .Critical Care  Performed by: Nira Conn, MD Authorized by: Nira Conn, MD   Critical care provider statement:     Critical care time (minutes):  32   Critical care was necessary to treat or prevent imminent or life-threatening deterioration of the following conditions:  Cardiac failure   Critical care was time spent personally by me on the following activities:  Development of treatment plan with patient or surrogate, discussions with consultants, evaluation of patient's response to treatment, examination of patient, ordering and review of laboratory studies, ordering and review of radiographic studies, ordering and performing treatments and interventions, pulse oximetry, re-evaluation of patient's condition and review of old charts   (including critical care time) Medical Decision Making / ED Course   Medical Decision Making Amount and/or Complexity of Data Reviewed Labs: ordered. Decision-making details documented in ED Course. Radiology: ordered and independent interpretation performed. Decision-making details documented in ED Course. ECG/medicine tests: ordered and independent interpretation performed. Decision-making details documented in ED Course.  Risk Prescription drug management. Decision regarding hospitalization.    Patient presents with chest discomfort related to A-fib RVR. No significant electrolyte derangements or anemia.  No leukocytosis.  Chest x-ray without evidence of pneumonia, pneumothorax, pulmonary edema or pleural effusions.  She was started on Cardizem drip for rate control.  On review of records, cardiology mention conversation regarding restarting patient on medication versus ablation.  Apparently patient is in the process of moving to [place].  Will admit patient to have cardiology evaluate to determine management.  Spoke with Dr. Arlean Hopping from the hospitalist service who agreed to admit patient.    Final Clinical Impression(s) / ED Diagnoses Final diagnoses:  Atrial fibrillation with RVR (HCC)    This chart was dictated using voice recognition software.  Despite  best efforts to proofread,  errors can occur which can change the documentation meaning.    Nira Conn, MD 09/27/23 [phone]

## 2023-09-28 ENCOUNTER — Other Ambulatory Visit (HOSPITAL_COMMUNITY): Payer: Self-pay

## 2023-09-28 DIAGNOSIS — I4891 Unspecified atrial fibrillation: Secondary | ICD-10-CM | POA: Diagnosis not present

## 2023-09-28 DIAGNOSIS — I480 Paroxysmal atrial fibrillation: Secondary | ICD-10-CM | POA: Diagnosis not present

## 2023-09-28 LAB — BASIC METABOLIC PANEL
Anion gap: 8 (ref 5–15)
BUN: 16 mg/dL (ref 6–20)
CO2: 24 mmol/L (ref 22–32)
Calcium: 8.9 mg/dL (ref 8.9–10.3)
Chloride: 108 mmol/L (ref 98–111)
Creatinine, Ser: 0.75 mg/dL (ref 0.44–1.00)
GFR, Estimated: >60 mL/min (ref >60)
Glucose, Bld: 123 mg/dL — ABNORMAL HIGH (ref 70–99)
Potassium: 4.2 mmol/L (ref 3.5–5.1)
Sodium: 140 mmol/L (ref 135–145)

## 2023-09-28 LAB — CBC
HCT: 35.9 % — ABNORMAL LOW (ref 36.0–46.0)
Hemoglobin: 12.2 g/dL (ref 12.0–15.0)
MCH: 30.3 pg (ref 26.0–34.0)
MCHC: 34 g/dL (ref 30.0–36.0)
MCV: 89.3 fL (ref 80.0–100.0)
Platelets: 171 10*3/uL (ref 150–400)
RBC: 4.02 MIL/uL (ref 3.87–5.11)
RDW: 13.1 % (ref 11.5–15.5)
WBC: 4.8 10*3/uL (ref 4.0–10.5)
nRBC: 0 % (ref 0.0–0.2)

## 2023-09-28 MED ORDER — APIXABAN 5 MG PO TABS: 5.0000 mg | ORAL_TABLET | Freq: Two times a day (BID) | ORAL | 0 refills | Status: DC

## 2023-09-28 MED ORDER — FLECAINIDE ACETATE 50 MG PO TABS: 50.0000 mg | ORAL_TABLET | Freq: Two times a day (BID) | ORAL | 0 refills | Status: DC

## 2023-09-28 MED ORDER — FLECAINIDE ACETATE 50 MG PO TABS
50.0000 mg | ORAL_TABLET | Freq: Two times a day (BID) | ORAL | 0 refills | Status: DC
  Filled 2023-09-28 (×2): qty 60, 30d supply, fill #0

## 2023-09-28 MED ORDER — METOPROLOL SUCCINATE ER 25 MG PO TB24
12.5000 mg | ORAL_TABLET | Freq: Every day | ORAL | 0 refills | Status: DC
  Filled 2023-09-28: qty 15, 30d supply, fill #0

## 2023-09-28 MED ORDER — APIXABAN 5 MG PO TABS
5.0000 mg | ORAL_TABLET | Freq: Two times a day (BID) | ORAL | 0 refills | Status: DC
  Filled 2023-09-28 (×2): qty 60, 30d supply, fill #0

## 2023-09-28 MED ORDER — METOPROLOL SUCCINATE ER 25 MG PO TB24: 12.5000 mg | ORAL_TABLET | Freq: Every day | ORAL | 0 refills | Status: DC

## 2023-09-28 NOTE — TOC Benefit Eligibility Note (Signed)
Patient Product/process development scientist completed.    The patient is insured through [employer]. Patient has Medicare and is not eligible for a copay card, but may be able to apply for patient assistance, if available.    Ran test claim for Eliquis 5 mg and the current 30 day co-pay is $0.00.   This test claim was processed through [facility]- copay amounts may vary at other pharmacies due to pharmacy/plan contracts, or as the patient moves through the different stages of their insurance plan.     Roland Earl, CPHT Pharmacy Technician III Certified Patient Advocate [facility] Patient Advocate Team Direct Number: [phone]  Fax: [phone]

## 2023-09-28 NOTE — Plan of Care (Signed)

## 2023-09-28 NOTE — Care Management (Signed)
Met with patient in room, provided with Eliquis card, no other needs identified

## 2023-09-28 NOTE — Progress Notes (Signed)
Cardiology Progress Note  Patient ID: Dawn Jordan MRN: [id number] DOB: 28-Feb-1966 Date of Encounter: 09/28/2023  Primary Cardiologist: Donato Schultz, MD  Subjective   Chief Complaint: None.   HPI: In NSR. No symptoms.   ROS:  All other ROS reviewed and negative. Pertinent positives noted in the HPI.     Inpatient Medications  Scheduled Meds:  apixaban  5 mg Oral BID   flecainide  50 mg Oral Q12H   gabapentin  300 mg Oral QHS   loratadine  10 mg Oral QPM   metoprolol succinate  12.5 mg Oral Daily   montelukast  10 mg Oral Daily   nabumetone  500 mg Oral QHS   pantoprazole  40 mg Oral Daily   Continuous Infusions:  PRN Meds: acetaminophen **OR** acetaminophen, albuterol, clonazePAM, ondansetron **OR** ondansetron (ZOFRAN) IV   Vital Signs   Vitals:   09/28/23 0053 09/28/23 0424 09/28/23 0500 09/28/23 0803  BP: 124/71 133/79  123/64  Pulse: 64   63  Resp: 15 20  17   Temp: 97.6 F (36.4 C)   98.2 F (36.8 C)  TempSrc: Oral   Oral  SpO2: 99% 99%  97%  Weight:   106.7 kg   Height:        Intake/Output Summary (Last 24 hours) at 09/28/2023 0854 Last data filed at 09/27/2023 1531 Gross per 24 hour  Intake 57.6 ml  Output --  Net 57.6 ml      09/28/2023    5:00 AM 09/27/2023    4:51 PM 05/31/2023    2:04 PM  Last 3 Weights  Weight (lbs) 235 lb 4.8 oz 245 lb 248 lb  Weight (kg) 106.731 kg 111.131 kg 112.492 kg      Telemetry  Overnight telemetry shows SR60s, which I personally reviewed.   Physical Exam   Vitals:   09/28/23 0053 09/28/23 0424 09/28/23 0500 09/28/23 0803  BP: 124/71 133/79  123/64  Pulse: 64   63  Resp: 15 20  17   Temp: 97.6 F (36.4 C)   98.2 F (36.8 C)  TempSrc: Oral   Oral  SpO2: 99% 99%  97%  Weight:   106.7 kg   Height:        Intake/Output Summary (Last 24 hours) at 09/28/2023 0854 Last data filed at 09/27/2023 1531 Gross per 24 hour  Intake 57.6 ml  Output --  Net 57.6 ml       09/28/2023    5:00 AM 09/27/2023     4:51 PM 05/31/2023    2:04 PM  Last 3 Weights  Weight (lbs) 235 lb 4.8 oz 245 lb 248 lb  Weight (kg) 106.731 kg 111.131 kg 112.492 kg    Body mass index is 39.16 kg/m.  General: Well nourished, well developed, in no acute distress Head: Atraumatic, normal size  Eyes: PEERLA, EOMI  Neck: Supple, no JVD Endocrine: No thryomegaly Cardiac: Normal S1, S2; RRR; no murmurs, rubs, or gallops Lungs: Clear to auscultation bilaterally, no wheezing, rhonchi or rales  Abd: Soft, nontender, no hepatomegaly  Ext: No edema, pulses 2+ Musculoskeletal: No deformities, BUE and BLE strength normal and equal Skin: Warm and dry, no rashes   Neuro: Alert and oriented to person, place, time, and situation, CNII-XII grossly intact, no focal deficits  Psych: Normal mood and affect   Labs  High Sensitivity Troponin:   Recent Labs  Lab 09/27/23 0904 09/27/23 1656  TROPONINIHS 72* 61*     Cardiac EnzymesNo results for input(s): "TROPONINI" in  the last 168 hours. No results for input(s): "TROPIPOC" in the last 168 hours.  Chemistry Recent Labs  Lab 09/27/23 0333 09/28/23 0607  NA 142 140  K 3.6 4.2  CL 109 108  CO2 21* 24  GLUCOSE 198* 123*  BUN 21* 16  CREATININE 0.92 0.75  CALCIUM 9.8 8.9  GFRNONAA >60 >60  ANIONGAP 12 8    Hematology Recent Labs  Lab 09/27/23 0333 09/28/23 0607  WBC 8.2 4.8  RBC 4.25 4.02  HGB 12.7 12.2  HCT 38.7 35.9*  MCV 91.1 89.3  MCH 29.9 30.3  MCHC 32.8 34.0  RDW 12.9 13.1  PLT 189 171   BNPNo results for input(s): "BNP", "PROBNP" in the last 168 hours.  DDimer  Recent Labs  Lab 09/27/23 1610  DDIMER <0.27     Radiology  ECHOCARDIOGRAM COMPLETE  Result Date: 09/27/2023    ECHOCARDIOGRAM REPORT   Patient Name:   Dawn Jordan Date of Exam: 09/27/2023 Medical Rec #:  [id number]   Height:       65.0 in Accession #:    [id number]  Weight:       248.0 lb Date of Birth:  11-Jul-1966  BSA:          2.168 m Patient Age:    56 years    BP:           119/67 mmHg  Patient Gender: F           HR:           67 bpm. Exam Location:  Inpatient Procedure: 2D Echo, Cardiac Doppler and Color Doppler Indications:    Atrial Fibrillation I48.91  History:        Patient has prior history of Echocardiogram examinations, most                 recent 06/05/2017. Arrythmias:Atrial Fibrillation,                 Signs/Symptoms:Chest Pain and Dyspnea; Risk Factors:Sleep Apnea                 and Dyslipidemia.  Sonographer:    Lucendia Herrlich RCS Referring Phys: Angie Fava IMPRESSIONS  1. Left ventricular ejection fraction, by estimation, is 60 to 65%. The left ventricle has normal function. The left ventricle has no regional wall motion abnormalities. Left ventricular diastolic parameters were normal.  2. Right ventricular systolic function is normal. The right ventricular size is normal. There is normal pulmonary artery systolic pressure. The estimated right ventricular systolic pressure is 21.8 mmHg.  3. The mitral valve is normal in structure. No evidence of mitral valve regurgitation. No evidence of mitral stenosis.  4. The aortic valve is tricuspid. There is mild calcification of the aortic valve. Aortic valve regurgitation is not visualized. No aortic stenosis is present.  5. Aortic dilatation noted. There is borderline dilatation of the ascending aorta, measuring 37 mm.  6. The inferior vena cava is normal in size with greater than 50% respiratory variability, suggesting right atrial pressure of 3 mmHg. FINDINGS  Left Ventricle: Left ventricular ejection fraction, by estimation, is 60 to 65%. The left ventricle has normal function. The left ventricle has no regional wall motion abnormalities. The left ventricular internal cavity size was normal in size. There is  no left ventricular hypertrophy. Left ventricular diastolic parameters were normal. Right Ventricle: The right ventricular size is normal. No increase in right ventricular wall thickness. Right ventricular systolic  function is normal. There  is normal pulmonary artery systolic pressure. The tricuspid regurgitant velocity is 2.17 m/s, and  with an assumed right atrial pressure of 3 mmHg, the estimated right ventricular systolic pressure is 21.8 mmHg. Left Atrium: Left atrial size was normal in size. Right Atrium: Right atrial size was normal in size. Pericardium: There is no evidence of pericardial effusion. Mitral Valve: The mitral valve is normal in structure. No evidence of mitral valve regurgitation. No evidence of mitral valve stenosis. Tricuspid Valve: The tricuspid valve is normal in structure. Tricuspid valve regurgitation is trivial. Aortic Valve: The aortic valve is tricuspid. There is mild calcification of the aortic valve. Aortic valve regurgitation is not visualized. No aortic stenosis is present. Aortic valve peak gradient measures 13.7 mmHg. Pulmonic Valve: The pulmonic valve was normal in structure. Pulmonic valve regurgitation is not visualized. Aorta: The aortic root is normal in size and structure and aortic dilatation noted. There is borderline dilatation of the ascending aorta, measuring 37 mm. Venous: The inferior vena cava is normal in size with greater than 50% respiratory variability, suggesting right atrial pressure of 3 mmHg. IAS/Shunts: No atrial level shunt detected by color flow Doppler.  LEFT VENTRICLE PLAX 2D LVIDd:         4.60 cm   Diastology LVIDs:         2.80 cm   LV e' medial:    12.60 cm/s LV PW:         1.10 cm   LV E/e' medial:  7.9 LV IVS:        1.00 cm   LV e' lateral:   11.70 cm/s LVOT diam:     1.90 cm   LV E/e' lateral: 8.5 LV SV:         58 LV SV Index:   27 LVOT Area:     2.84 cm  RIGHT VENTRICLE             IVC RV S prime:     16.50 cm/s  IVC diam: 1.50 cm TAPSE (M-mode): 2.3 cm LEFT ATRIUM             Index        RIGHT ATRIUM           Index LA diam:        3.60 cm 1.66 cm/m   RA Area:     11.50 cm LA Vol (A2C):   43.0 ml 19.82 ml/m  RA Volume:   23.40 ml  10.80 ml/m LA  Vol (A4C):   65.3 ml 30.13 ml/m LA Biplane Vol: 60.3 ml 27.82 ml/m  AORTIC VALVE AV Area (Vmax): 1.69 cm AV Vmax:        185.00 cm/s AV Peak Grad:   13.7 mmHg LVOT Vmax:      110.00 cm/s LVOT Vmean:     71.725 cm/s LVOT VTI:       0.205 m  AORTA Ao Root diam: 3.20 cm Ao Asc diam:  3.70 cm MITRAL VALVE               TRICUSPID VALVE MV Area (PHT): 3.72 cm    TR Peak grad:   18.8 mmHg MV Decel Time: 204 msec    TR Vmax:        217.00 cm/s MV E velocity: 99.40 cm/s MV A velocity: 94.50 cm/s  SHUNTS MV E/A ratio:  1.05        Systemic VTI:  0.21 m  Systemic Diam: 1.90 cm Dalton McleanMD Electronically signed by Wilfred Lacy Signature Date/Time: 09/27/2023/2:10:10 PM    Final    DG Chest 1 View  Result Date: 09/27/2023 CLINICAL DATA:  Atrial fibrillation.  Irregular heartbeat. EXAM: CHEST  1 VIEW COMPARISON:  11/16/2022 FINDINGS: Heart size and pulmonary vascularity are normal for technique. Lungs are clear. No pleural effusions. No pneumothorax. Mediastinal contours appear intact. Degenerative changes in the spine. IMPRESSION: No active disease. Electronically Signed   By: Burman Nieves M.D.   On: 09/27/2023 03:59    Cardiac Studies  TTE 09/27/2023  1. Left ventricular ejection fraction, by estimation, is 60 to 65%. The  left ventricle has normal function. The left ventricle has no regional  wall motion abnormalities. Left ventricular diastolic parameters were  normal.   2. Right ventricular systolic function is normal. The right ventricular  size is normal. There is normal pulmonary artery systolic pressure. The  estimated right ventricular systolic pressure is 21.8 mmHg.   3. The mitral valve is normal in structure. No evidence of mitral valve  regurgitation. No evidence of mitral stenosis.   4. The aortic valve is tricuspid. There is mild calcification of the  aortic valve. Aortic valve regurgitation is not visualized. No aortic  stenosis is present.   5. Aortic  dilatation noted. There is borderline dilatation of the  ascending aorta, measuring 37 mm.   6. The inferior vena cava is normal in size with greater than 50%  respiratory variability, suggesting right atrial pressure of 3 mmHg.   Patient Profile  57 year old adult with history of paroxysmal atrial fibrillation, OSA, obesity, RA admitted on 09/27/2023 with atrial fibrillation with RVR.   Assessment & Plan   # Paroxysmal Afib -Maintaining sinus rhythm.  Echo normal.  Free T4 normal.  Repeat thyroid labs in 4 to 6 weeks. -Continue flecainide 50 mg twice daily and Toprol-XL 12.5 mg daily.  She should go out on this regimen. -Recommend 1 month of Eliquis post cardioversion. -CHA2DS2-VASc equals 1.  No need for long-term anticoagulation. -Needs better treatment of sleep apnea as an outpatient. -The patient plans to move to [place].  She can follow-up in [place] with a new cardiologist.  She will need to establish.  # Chest pain # Demand ischemia -Troponins minimally elevated and flat.  Recent cardiac PET scan negative.  Troponin elevation is secondary to rapid A-fib.  No further workup indicated.   [facility] will sign off.   Medication Recommendations:  AS above Other recommendations (labs, testing, etc):  none Follow up as an outpatient:  PRN. The patient is moving to [place] [employer].   For questions or updates, please contact [facility] Please consult [email or web address] for contact info under        Signed, Gerri Spore T. Flora Lipps, MD, [facility] [place]  [facility]  09/28/2023 8:54 AM

## 2023-09-28 NOTE — Discharge Summary (Signed)
Physician Discharge Summary  Dawn Jordan YQM:[id number] DOB: 03/08/1966 DOA: 09/27/2023  PCP: [place], Betty G, MD  Admit date: 09/27/2023 Discharge date: 09/28/2023  Admitted From: Home Disposition:  Home  Recommendations for Outpatient Follow-up:  Follow up with PCP in 1-2 weeks Follow up with cardiology as scheduled  Home Health:None  Equipment/Devices:None  Discharge Condition:Stable  CODE STATUS:Full  Diet recommendation: Low salt low fat diet    Brief/Interim Summary: Maie Mynatt is a 57 y.o. adult with medical history significant of hypertension, atrial fibrillation previously on flecainide, rheumatoid arthritis, anxiety, depression, GERD, and obesity who presented with complaints of chest pain.   Admitted for cardiology evaluation - likely demand ischemia in the setting of tachycardia. No further workup indicated at this time - continue flecainide, metoprolol, eliquis for 1 month post cardioversion per cardiology. Otherwise stable and agreeable for discharge home.  Discharge Diagnoses:  Principal Problem:   Atrial fibrillation with RVR (HCC) Active Problems:   Chest pain   Transient hypotension   Hyperglycemia   Asthma   Anxiety and depression   Chronic rheumatic arthritis (HCC)   Fibromyalgia syndrome   Gastroesophageal reflux disease    Discharge Instructions   Allergies as of 09/28/2023       Reactions   Almond (diagnostic) Anaphylaxis   Egg-derived Products Diarrhea, Other (See Comments)   GI intolerance    Latex Anaphylaxis, Hives, Itching   "Anaphylaxis is only when I am in a closed area (ex: car with balloons)"   Other Other (See Comments)   Sinus headache from new plastics, carpets, etc.   Banana Other (See Comments)   Mouth burning   Food Other (See Comments)   Dates - Mouth burning   Kiwi Extract Other (See Comments)   Mouth burning   Norco [hydrocodone-acetaminophen] Itching   OK with Benadryl premed   Oxycontin [oxycodone] Itching   OK  with Benadryl premed   Codeine Other (See Comments)   Headaches    Wound Dressing Adhesive Itching, Rash   Blistering rash        Medication List     TAKE these medications    acetaminophen 500 MG tablet Commonly known as: TYLENOL Take 2,000 mg by mouth 2 (two) times daily as needed for moderate pain (pain score 4-6).   Actemra 162 MG/0.9ML Sosy Generic drug: Tocilizumab Inject 162 mg as directed every Sunday.   apixaban 5 MG Tabs tablet Commonly known as: ELIQUIS Take 1 tablet (5 mg total) by mouth 2 (two) times daily.   clonazePAM 0.5 MG tablet Commonly known as: KLONOPIN Take 1 tablet (0.5 mg total) by mouth 3 (three) times daily as needed for anxiety.   diphenhydrAMINE 25 mg capsule Commonly known as: Benadryl Allergy Take 25-50mg  po qHS prn insomnia What changed:  how much to take how to take this when to take this reasons to take this   EPINEPHrine 0.3 mg/0.3 mL Soaj injection Commonly known as: EPI-PEN Use for life threatening allergic reactions   flecainide 50 MG tablet Commonly known as: TAMBOCOR Take 1 tablet (50 mg total) by mouth every 12 (twelve) hours.   fluticasone 220 MCG/ACT inhaler Commonly known as: Flovent HFA INHALE TWO PUFFS INTO THE LUNGS EVERY MORNING & AT BEDTIME What changed:  how much to take how to take this when to take this reasons to take this additional instructions   fluticasone 50 MCG/ACT nasal spray Commonly known as: FLONASE PLACE ONE SPRAY IN EACH NOSTRIL EVERY DAY What changed: See the new instructions.  gabapentin 300 MG capsule Commonly known as: NEURONTIN Take 300 mg by mouth at bedtime.   levalbuterol 45 MCG/ACT inhaler Commonly known as: XOPENEX HFA INHALE TWO PUFFS INTO THE LUNGS EVERY 6 HOURS AS NEEDED FOR WHEEZING   levocetirizine 5 MG tablet Commonly known as: XYZAL Take 1 tablet (5 mg total) by mouth every evening.   metoprolol succinate 25 MG 24 hr tablet Commonly known as: TOPROL-XL Take 0.5  tablets (12.5 mg total) by mouth daily. Start taking on: September 29, 2023   montelukast 10 MG tablet Commonly known as: SINGULAIR TAKE ONE TABLET BY MOUTH EVERY DAY   nabumetone 500 MG tablet Commonly known as: RELAFEN Take 500 mg by mouth at bedtime.   nitroGLYCERIN 0.4 MG SL tablet Commonly known as: NITROSTAT Place 1 tablet (0.4 mg total) under the tongue every 5 (five) minutes as needed for chest pain.   Olopatadine HCl 0.2 % Soln Place 1 drop into both eyes daily as needed.   omeprazole 40 MG capsule Commonly known as: PRILOSEC Take 40 mg by mouth daily.   Oxycodone HCl 10 MG Tabs Take 10 mg by mouth daily as needed (severe pain).   triamcinolone ointment 0.1 % Commonly known as: KENALOG Apply 1 application topically 2 (two) times daily. What changed:  when to take this reasons to take this        Allergies  Allergen Reactions   Almond (Diagnostic) Anaphylaxis   Egg-Derived Products Diarrhea and Other (See Comments)    GI intolerance    Latex Anaphylaxis, Hives and Itching    "Anaphylaxis is only when I am in a closed area (ex: car with balloons)"   Other Other (See Comments)    Sinus headache from new plastics, carpets, etc.   Banana Other (See Comments)    Mouth burning   Food Other (See Comments)    Dates - Mouth burning   Kiwi Extract Other (See Comments)    Mouth burning   Norco [Hydrocodone-Acetaminophen] Itching    OK with Benadryl premed   Oxycontin [Oxycodone] Itching    OK with Benadryl premed   Codeine Other (See Comments)    Headaches    Wound Dressing Adhesive Itching and Rash    Blistering rash    Consultations: Cardiology   Procedures/Studies: ECHOCARDIOGRAM COMPLETE  Result Date: 09/27/2023    ECHOCARDIOGRAM REPORT   Patient Name:   Dawn Jordan Date of Exam: 09/27/2023 Medical Rec #:  [id number]   Height:       65.0 in Accession #:    [id number]  Weight:       248.0 lb Date of Birth:  04/24/1966  BSA:          2.168 m Patient  Age:    57 years    BP:           119/67 mmHg Patient Gender: F           HR:           67 bpm. Exam Location:  Inpatient Procedure: 2D Echo, Cardiac Doppler and Color Doppler Indications:    Atrial Fibrillation I48.91  History:        Patient has prior history of Echocardiogram examinations, most                 recent 06/05/2017. Arrythmias:Atrial Fibrillation,                 Signs/Symptoms:Chest Pain and Dyspnea; Risk Factors:Sleep Apnea  and Dyslipidemia.  Sonographer:    Lucendia Herrlich RCS Referring Phys: Angie Fava IMPRESSIONS  1. Left ventricular ejection fraction, by estimation, is 60 to 65%. The left ventricle has normal function. The left ventricle has no regional wall motion abnormalities. Left ventricular diastolic parameters were normal.  2. Right ventricular systolic function is normal. The right ventricular size is normal. There is normal pulmonary artery systolic pressure. The estimated right ventricular systolic pressure is 21.8 mmHg.  3. The mitral valve is normal in structure. No evidence of mitral valve regurgitation. No evidence of mitral stenosis.  4. The aortic valve is tricuspid. There is mild calcification of the aortic valve. Aortic valve regurgitation is not visualized. No aortic stenosis is present.  5. Aortic dilatation noted. There is borderline dilatation of the ascending aorta, measuring 37 mm.  6. The inferior vena cava is normal in size with greater than 50% respiratory variability, suggesting right atrial pressure of 3 mmHg. FINDINGS  Left Ventricle: Left ventricular ejection fraction, by estimation, is 60 to 65%. The left ventricle has normal function. The left ventricle has no regional wall motion abnormalities. The left ventricular internal cavity size was normal in size. There is  no left ventricular hypertrophy. Left ventricular diastolic parameters were normal. Right Ventricle: The right ventricular size is normal. No increase in right ventricular wall  thickness. Right ventricular systolic function is normal. There is normal pulmonary artery systolic pressure. The tricuspid regurgitant velocity is 2.17 m/s, and  with an assumed right atrial pressure of 3 mmHg, the estimated right ventricular systolic pressure is 21.8 mmHg. Left Atrium: Left atrial size was normal in size. Right Atrium: Right atrial size was normal in size. Pericardium: There is no evidence of pericardial effusion. Mitral Valve: The mitral valve is normal in structure. No evidence of mitral valve regurgitation. No evidence of mitral valve stenosis. Tricuspid Valve: The tricuspid valve is normal in structure. Tricuspid valve regurgitation is trivial. Aortic Valve: The aortic valve is tricuspid. There is mild calcification of the aortic valve. Aortic valve regurgitation is not visualized. No aortic stenosis is present. Aortic valve peak gradient measures 13.7 mmHg. Pulmonic Valve: The pulmonic valve was normal in structure. Pulmonic valve regurgitation is not visualized. Aorta: The aortic root is normal in size and structure and aortic dilatation noted. There is borderline dilatation of the ascending aorta, measuring 37 mm. Venous: The inferior vena cava is normal in size with greater than 50% respiratory variability, suggesting right atrial pressure of 3 mmHg. IAS/Shunts: No atrial level shunt detected by color flow Doppler.  LEFT VENTRICLE PLAX 2D LVIDd:         4.60 cm   Diastology LVIDs:         2.80 cm   LV e' medial:    12.60 cm/s LV PW:         1.10 cm   LV E/e' medial:  7.9 LV IVS:        1.00 cm   LV e' lateral:   11.70 cm/s LVOT diam:     1.90 cm   LV E/e' lateral: 8.5 LV SV:         58 LV SV Index:   27 LVOT Area:     2.84 cm  RIGHT VENTRICLE             IVC RV S prime:     16.50 cm/s  IVC diam: 1.50 cm TAPSE (M-mode): 2.3 cm LEFT ATRIUM  Index        RIGHT ATRIUM           Index LA diam:        3.60 cm 1.66 cm/m   RA Area:     11.50 cm LA Vol (A2C):   43.0 ml 19.82 ml/m  RA  Volume:   23.40 ml  10.80 ml/m LA Vol (A4C):   65.3 ml 30.13 ml/m LA Biplane Vol: 60.3 ml 27.82 ml/m  AORTIC VALVE AV Area (Vmax): 1.69 cm AV Vmax:        185.00 cm/s AV Peak Grad:   13.7 mmHg LVOT Vmax:      110.00 cm/s LVOT Vmean:     71.725 cm/s LVOT VTI:       0.205 m  AORTA Ao Root diam: 3.20 cm Ao Asc diam:  3.70 cm MITRAL VALVE               TRICUSPID VALVE MV Area (PHT): 3.72 cm    TR Peak grad:   18.8 mmHg MV Decel Time: 204 msec    TR Vmax:        217.00 cm/s MV E velocity: 99.40 cm/s MV A velocity: 94.50 cm/s  SHUNTS MV E/A ratio:  1.05        Systemic VTI:  0.21 m                            Systemic Diam: 1.90 cm Dalton McleanMD Electronically signed by Wilfred Lacy Signature Date/Time: 09/27/2023/2:10:10 PM    Final    DG Chest 1 View  Result Date: 09/27/2023 CLINICAL DATA:  Atrial fibrillation.  Irregular heartbeat. EXAM: CHEST  1 VIEW COMPARISON:  11/16/2022 FINDINGS: Heart size and pulmonary vascularity are normal for technique. Lungs are clear. No pleural effusions. No pneumothorax. Mediastinal contours appear intact. Degenerative changes in the spine. IMPRESSION: No active disease. Electronically Signed   By: Burman Nieves M.D.   On: 09/27/2023 03:59     Subjective: No acute issues/events overnight   Discharge Exam: Vitals:   09/28/23 0424 09/28/23 0803  BP: 133/79 123/64  Pulse:  63  Resp: 20 17  Temp:  98.2 F (36.8 C)  SpO2: 99% 97%   Vitals:   09/28/23 0053 09/28/23 0424 09/28/23 0500 09/28/23 0803  BP: 124/71 133/79  123/64  Pulse: 64   63  Resp: 15 20  17   Temp: 97.6 F (36.4 C)   98.2 F (36.8 C)  TempSrc: Oral   Oral  SpO2: 99% 99%  97%  Weight:   106.7 kg   Height:        General: Pt is alert, awake, not in acute distress Cardiovascular: RRR, S1/S2 +, no rubs, no gallops Respiratory: CTA bilaterally, no wheezing, no rhonchi Abdominal: Soft, NT, ND, bowel sounds + Extremities: no edema, no cyanosis    The results of significant  diagnostics from this hospitalization (including imaging, microbiology, ancillary and laboratory) are listed below for reference.     Microbiology: No results found for this or any previous visit (from the past 240 hour(s)).   Labs: BNP (last 3 results) No results for input(s): "BNP" in the last 8760 hours. Basic Metabolic Panel: Recent Labs  Lab 09/27/23 0333 09/28/23 0607  NA 142 140  K 3.6 4.2  CL 109 108  CO2 21* 24  GLUCOSE 198* 123*  BUN 21* 16  CREATININE 0.92 0.75  CALCIUM 9.8 8.9  MG 2.0  --  Liver Function Tests: No results for input(s): "AST", "ALT", "ALKPHOS", "BILITOT", "PROT", "ALBUMIN" in the last 168 hours. No results for input(s): "LIPASE", "AMYLASE" in the last 168 hours. No results for input(s): "AMMONIA" in the last 168 hours. CBC: Recent Labs  Lab 09/27/23 0333 09/28/23 0607  WBC 8.2 4.8  HGB 12.7 12.2  HCT 38.7 35.9*  MCV 91.1 89.3  PLT 189 171   Cardiac Enzymes: No results for input(s): "CKTOTAL", "CKMB", "CKMBINDEX", "TROPONINI" in the last 168 hours. BNP: Invalid input(s): "POCBNP" CBG: No results for input(s): "GLUCAP" in the last 168 hours. D-Dimer Recent Labs    09/27/23 0904  DDIMER <0.27   Hgb A1c Recent Labs    09/27/23 0333  HGBA1C 5.2   Lipid Profile No results for input(s): "CHOL", "HDL", "LDLCALC", "TRIG", "CHOLHDL", "LDLDIRECT" in the last 72 hours. Thyroid function studies Recent Labs    09/27/23 0333  TSH 0.135*   Anemia work up No results for input(s): "VITAMINB12", "FOLATE", "FERRITIN", "TIBC", "IRON", "RETICCTPCT" in the last 72 hours. Urinalysis    Component Value Date/Time   COLORURINE YELLOW 02/15/2022 1348   APPEARANCEUR CLEAR 02/15/2022 1348   LABSPEC 1.023 02/15/2022 1348   PHURINE 5.0 02/15/2022 1348   GLUCOSEU NEGATIVE 02/15/2022 1348   HGBUR SMALL (A) 02/15/2022 1348   BILIRUBINUR NEGATIVE 02/15/2022 1348   KETONESUR NEGATIVE 02/15/2022 1348   PROTEINUR NEGATIVE 02/15/2022 1348   NITRITE  NEGATIVE 02/15/2022 1348   LEUKOCYTESUR NEGATIVE 02/15/2022 1348   Sepsis Labs Recent Labs  Lab 09/27/23 0333 09/28/23 0607  WBC 8.2 4.8   Microbiology No results found for this or any previous visit (from the past 240 hour(s)).   Time coordinating discharge: Over 30 minutes  SIGNED:   Azucena Fallen, DO [facility] 09/28/2023, 9:39 AM Pager   If 7PM-7AM, please contact night-coverage [email or web address]

## 2023-09-29 ENCOUNTER — Encounter (HOSPITAL_COMMUNITY): Payer: Self-pay | Admitting: Clinical

## 2023-09-29 ENCOUNTER — Ambulatory Visit (HOSPITAL_COMMUNITY): Payer: Medicare HMO | Admitting: Clinical

## 2023-09-29 ENCOUNTER — Telehealth: Payer: Self-pay

## 2023-09-29 ENCOUNTER — Ambulatory Visit (HOSPITAL_BASED_OUTPATIENT_CLINIC_OR_DEPARTMENT_OTHER): Payer: Medicare HMO | Admitting: Student

## 2023-09-29 DIAGNOSIS — R632 Polyphagia: Secondary | ICD-10-CM | POA: Diagnosis not present

## 2023-09-29 DIAGNOSIS — F331 Major depressive disorder, recurrent, moderate: Secondary | ICD-10-CM | POA: Diagnosis not present

## 2023-09-29 DIAGNOSIS — F900 Attention-deficit hyperactivity disorder, predominantly inattentive type: Secondary | ICD-10-CM | POA: Diagnosis not present

## 2023-09-29 DIAGNOSIS — F4522 Body dysmorphic disorder: Secondary | ICD-10-CM | POA: Diagnosis not present

## 2023-09-29 DIAGNOSIS — F4310 Post-traumatic stress disorder, unspecified: Secondary | ICD-10-CM | POA: Diagnosis not present
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
## 2023-09-29 NOTE — Transitions of Care (Post Inpatient/ED Visit) (Signed)
09/29/2023  Name: Dawn Jordan MRN: [id number] DOB: 1965/12/21  Today's TOC FU Call Status: Today's TOC FU Call Status:: Successful TOC FU Call Completed TOC FU Call Complete Date: 09/29/23 Patient's Name and Date of Birth confirmed.  Transition Care Management Follow-up Telephone Call Date of Discharge: 09/28/23 Discharge Facility: [facility]) Type of Discharge: Inpatient Admission Primary Inpatient Discharge Diagnosis:: afib How have you been since you were released from the hospital?: Better Any questions or concerns?: No  Items Reviewed: Did you receive and understand the discharge instructions provided?: Yes Medications obtained,verified, and reconciled?: Yes (Medications Reviewed) Any new allergies since your discharge?: No Dietary orders reviewed?: Yes Do you have support at home?: No  Medications Reviewed Today: Medications Reviewed Today     Reviewed by Karena Addison, LPN (Licensed Practical Nurse) on 09/29/23 at 1118  Med List Status: <None>   Medication Order Taking? Sig Documenting Provider Last Dose Status Informant  acetaminophen (TYLENOL) 500 MG tablet [id number] No Take 2,000 mg by mouth 2 (two) times daily as needed for moderate pain (pain score 4-6). [provider] Past Week Active Self, Pharmacy Records  ACTEMRA 162 MG/0.9ML SOSY [id number] No Inject 162 mg as directed every Sunday. [provider] 09/26/2023 Active Self, Pharmacy Records           Med Note (COFFELL, Foye Spurling Sep 27, 2023  7:41 AM)    apixaban (ELIQUIS) 5 MG TABS tablet [id number]  Take 1 tablet (5 mg total) by mouth 2 (two) times daily. Azucena Fallen, MD  Active   clonazePAM Scarlette Calico) 0.5 MG tablet [id number] No Take 1 tablet (0.5 mg total) by mouth 3 (three) times daily as needed for anxiety. Oletta Darter, MD Past Week Active Self, Pharmacy Records  diphenhydrAMINE (BENADRYL ALLERGY) 25 mg capsule [id number] No Take 25-50mg  po qHS prn insomnia  Patient  taking differently: Take 25-50 mg by mouth daily as needed for sleep or itching (premed). Take 25-50mg  po qHS prn insomnia   Oletta Darter, MD Past Month Active Self, Pharmacy Records  EPINEPHrine 0.3 mg/0.3 mL IJ SOAJ injection [id number] No Use for life threatening allergic reactions Ambs, Norvel Richards, FNP NEVER Active Self, Pharmacy Records  flecainide (TAMBOCOR) 50 MG tablet [id number]  Take 1 tablet (50 mg total) by mouth every 12 (twelve) hours. Azucena Fallen, MD  Active   fluticasone [facility]) 50 MCG/ACT nasal spray [id number] No PLACE ONE SPRAY IN EACH NOSTRIL EVERY DAY  Patient taking differently: Place 1 spray into both nostrils daily as needed for allergies.   Hetty Blend, FNP Past Month Active Self, Pharmacy Records  fluticasone [facility] HFA) 220 MCG/ACT inhaler [id number] No INHALE TWO PUFFS INTO THE LUNGS EVERY MORNING & AT BEDTIME  Patient taking differently: Inhale 2 puffs into the lungs 2 (two) times daily as needed (wheezing, shortness of breath).   Ellamae Sia, DO UNK Active Self, Pharmacy Records  gabapentin (NEURONTIN) 300 MG capsule [id number] No Take 300 mg by mouth at bedtime. [provider] Past Week Active Self, Pharmacy Records  levalbuterol [facility] HFA) 45 MCG/ACT inhaler [id number] No INHALE TWO PUFFS INTO THE LUNGS EVERY 6 HOURS AS NEEDED FOR WHEEZING Ellamae Sia, DO Past Month Active Self, Pharmacy Records  levocetirizine (XYZAL) 5 MG tablet [id number] No Take 1 tablet (5 mg total) by mouth every evening. Ellamae Sia, DO Past Week Active Self, Pharmacy Records  metoprolol succinate (TOPROL-XL) 25 MG 24 hr tablet [id number]  Take 0.5 tablets (12.5 mg  total) by mouth daily. Azucena Fallen, MD  Active   montelukast (SINGULAIR) 10 MG tablet [id number] No TAKE ONE TABLET BY MOUTH EVERY DAY Ambs, Norvel Richards, FNP Past Month Active Self, Pharmacy Records  nabumetone (RELAFEN) 500 MG tablet [id number] No Take 500 mg by mouth at bedtime. [provider] Past Week  Active Self, Pharmacy Records  nitroGLYCERIN (NITROSTAT) 0.4 MG SL tablet [id number] No Place 1 tablet (0.4 mg total) under the tongue every 5 (five) minutes as needed for chest pain. Smitty Knudsen, PA-C 09/26/2023 Active Self, Pharmacy Records           Med Note (COFFELL, Marzella Schlein   Mon Sep 27, 2023  9:11 AM) 3 doses last night, 10/20.  Olopatadine HCl 0.2 % SOLN [id number] No Place 1 drop into both eyes daily as needed. Ellamae Sia, DO Past Month Active Self, Pharmacy Records  omeprazole (PRILOSEC) 40 MG capsule [id number] No Take 40 mg by mouth daily. [provider] Past Week Active Self, Pharmacy Records  Oxycodone HCl 10 MG TABS [id number] No Take 10 mg by mouth daily as needed (severe pain). [provider] Past Month Active Self, Pharmacy Records  triamcinolone ointment (KENALOG) 0.1 % [id number] No Apply 1 application topically 2 (two) times daily.  Patient taking differently: Apply 1 application  topically 2 (two) times daily as needed (eczema).   Ambs, Norvel Richards, FNP UNK Active Self, Pharmacy Records            Home Care and Equipment/Supplies: Were Home Health Services Ordered?: NA Any new equipment or medical supplies ordered?: NA  Functional Questionnaire: Do you need assistance with bathing/showering or dressing?: No Do you need assistance with meal preparation?: No Do you need assistance with eating?: No Do you have difficulty maintaining continence: No Do you need assistance with getting out of bed/getting out of a chair/moving?: No Do you have difficulty managing or taking your medications?: No  Follow up appointments reviewed: PCP Follow-up appointment confirmed?: No (patient declined moving to [facility]) MD Provider Line [phone] Given: No Specialist Hospital Follow-up appointment confirmed?: NA Do you need transportation to your follow-up appointment?: No Do you understand care options if your condition(s) worsen?: Yes-patient verbalized  understanding    SIGNATURE Karena Addison, LPN [facility] Nurse Health Advisor Direct Dial [phone]
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
## 2023-10-11 ENCOUNTER — Ambulatory Visit (HOSPITAL_COMMUNITY): Payer: Medicare HMO | Admitting: Clinical

## 2023-10-11 ENCOUNTER — Encounter (HOSPITAL_COMMUNITY): Payer: Self-pay | Admitting: Clinical

## 2023-10-11 DIAGNOSIS — F4310 Post-traumatic stress disorder, unspecified: Secondary | ICD-10-CM

## 2023-10-11 DIAGNOSIS — F331 Major depressive disorder, recurrent, moderate: Secondary | ICD-10-CM

## 2023-10-11 DIAGNOSIS — F900 Attention-deficit hyperactivity disorder, predominantly inattentive type: Secondary | ICD-10-CM | POA: Diagnosis not present

## 2023-10-11 DIAGNOSIS — R632 Polyphagia: Secondary | ICD-10-CM | POA: Diagnosis not present

## 2023-10-11 DIAGNOSIS — F4522 Body dysmorphic disorder: Secondary | ICD-10-CM
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
## 2023-10-25 ENCOUNTER — Encounter (HOSPITAL_COMMUNITY): Payer: Self-pay | Admitting: Clinical

## 2023-10-25 ENCOUNTER — Ambulatory Visit (INDEPENDENT_AMBULATORY_CARE_PROVIDER_SITE_OTHER): Payer: Medicare HMO | Admitting: Clinical

## 2023-10-25 DIAGNOSIS — F4522 Body dysmorphic disorder: Secondary | ICD-10-CM

## 2023-10-25 DIAGNOSIS — F331 Major depressive disorder, recurrent, moderate: Secondary | ICD-10-CM

## 2023-10-25 DIAGNOSIS — F900 Attention-deficit hyperactivity disorder, predominantly inattentive type: Secondary | ICD-10-CM | POA: Diagnosis not present

## 2023-10-25 DIAGNOSIS — F4310 Post-traumatic stress disorder, unspecified: Secondary | ICD-10-CM
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
## 2023-10-26 ENCOUNTER — Encounter: Payer: Self-pay | Admitting: Cardiology

## 2023-10-26 DIAGNOSIS — I480 Paroxysmal atrial fibrillation: Secondary | ICD-10-CM

## 2023-10-28 ENCOUNTER — Telehealth: Payer: Self-pay | Admitting: Cardiology

## 2023-10-28 MED ORDER — FLECAINIDE ACETATE 50 MG PO TABS: 50.0000 mg | ORAL_TABLET | Freq: Two times a day (BID) | ORAL | 0 refills | Status: DC

## 2023-10-28 MED ORDER — METOPROLOL SUCCINATE ER 25 MG PO TB24: 12.5000 mg | ORAL_TABLET | Freq: Every day | ORAL | 0 refills | Status: DC

## 2023-10-28 NOTE — Telephone Encounter (Signed)
 This has been addressed in a separate encounter.  Please see that encounter for complete details.

## 2023-10-28 NOTE — Telephone Encounter (Signed)
Spoke with pt and advised medications have been sent to pharmacy as requested.  Appointment scheduled with Dr Anne Fu for hospital follow up.  Pt verbalizes understanding and agrees with current plan.   Note Please see the MyChart message reply(ies) for my assessment and plan.      You may stop the Eliquis since you have completed 1 month of treatment post conversion to normal rhythm.  Your CHA2DS2-VASc score is 1, does not require long-term anticoagulation.   I would recommend continuing the metoprolol as well as the flecainide since you had a recurrence of atrial fibrillation.   I would also recommend in [place] establishing with a new cardiologist to consider atrial fibrillation ablation. Obviously if you would like [name] to set you up with a consultation with electrophysiology to discuss ablation here we would be more than happy to as well.   This patient gave consent for this Medical Advice Message and is aware that it may result in a bill to [employer], as well as the possibility of receiving a bill for a co-payment or deductible. They are an established patient, but are not seeking medical advice exclusively about a problem treated during an in person or video visit in the last seven days. I did not recommend an in person or video visit within seven days of my reply.

## 2023-10-28 NOTE — Telephone Encounter (Signed)
Please advise on refills. Appears they may have been started in hospital. Thank you

## 2023-10-28 NOTE — Telephone Encounter (Signed)
Please see the MyChart message reply(ies) for my assessment and plan.    You may stop the Eliquis since you have completed 1 month of treatment post conversion to normal rhythm.  Your CHA2DS2-VASc score is 1, does not require long-term anticoagulation.  I would recommend continuing the metoprolol as well as the flecainide since you had a recurrence of atrial fibrillation.  I would also recommend in [place] establishing with a new cardiologist to consider atrial fibrillation ablation. Obviously if you would like [name] to set you up with a consultation with electrophysiology to discuss ablation here we would be more than happy to as well.   This patient gave consent for this Medical Advice Message and is aware that it may result in a bill to [employer], as well as the possibility of receiving a bill for a co-payment or deductible. They are an established patient, but are not seeking medical advice exclusively about a problem treated during an in person or video visit in the last seven days. I did not recommend an in person or video visit within seven days of my reply.    I spent a total of 10 minutes cumulative time within 7 days through [employer].  Donato Schultz, MD

## 2023-10-28 NOTE — Telephone Encounter (Signed)
Pt c/o medication issue:  1. Name of Medication:  apixaban (ELIQUIS) 5 MG TABS tablet  2. How are you currently taking this medication (dosage and times per day)?  3. Are you having a reaction (difficulty breathing--STAT)?   4. What is your medication issue?  Patient states she was prescribed Eliquis in the hospital and she was supposed to take it for 4 weeks, then stop. She would like it know if she need to just stop taking it completely or wean herself off. Please advise.  Patient also mentions that she moved to [facility] [place] and she would like to know if a referral can be sent for a cardiologist in the area.

## 2023-10-28 NOTE — Telephone Encounter (Signed)
*  STAT* If patient is at the pharmacy, call can be transferred to refill team.   1. Which medications need to be refilled? (please list name of each medication and dose if known)  metoprolol succinate (TOPROL-XL) 25 MG 24 hr tablet   flecainide (TAMBOCOR) 50 MG tablet   2. Which pharmacy/location (including street and city if local pharmacy) is medication to be sent to? [facility] - [address] [place], [place] [address]  3. Do they need a 30 day or 90 day supply?   90 day supply

## 2023-10-29 MED ORDER — METOPROLOL SUCCINATE ER 25 MG PO TB24: 12.5000 mg | ORAL_TABLET | Freq: Every day | ORAL | 1 refills | Status: DC

## 2023-10-29 MED ORDER — FLECAINIDE ACETATE 50 MG PO TABS: 50.0000 mg | ORAL_TABLET | Freq: Two times a day (BID) | ORAL | 1 refills | Status: DC

## 2023-11-02 ENCOUNTER — Ambulatory Visit: Payer: Medicare HMO | Attending: Cardiology | Admitting: Cardiology

## 2023-11-02 ENCOUNTER — Encounter: Payer: Self-pay | Admitting: Cardiology

## 2023-11-02 VITALS — BP 122/86 | HR 61 | Ht 65.0 in | Wt 233.6 lb

## 2023-11-02 DIAGNOSIS — I480 Paroxysmal atrial fibrillation: Secondary | ICD-10-CM | POA: Diagnosis not present

## 2023-11-02 DIAGNOSIS — R002 Palpitations: Secondary | ICD-10-CM | POA: Diagnosis not present

## 2023-11-02 NOTE — Progress Notes (Signed)
Cardiology Office Note:  .   Date:  11/02/2023  ID:  [name], DOB 09/26/1966, MRN [id number] PCP: [place], Betty G, MD  [facility] Providers Cardiologist:  Donato Schultz, MD     History of Present Illness: .   Dawn Jordan is a 57 y.o. adult Discussed with the use of AI scribe  History of Present Illness   The patient, a 57 year old with a history of atrial fibrillation, presented with concerns about her recent atrial fibrillation episode. She reported that she had been experiencing palpitations and had previously experienced chest discomfort. The patient had a coronary CT in 2020 with a calcium score of zero and a cardiac PET scan that was normal. Her ejection fraction was reported to be 53%.  The patient reported that she had been feeling well since starting her medication, with no recurrence of the atrial fibrillation symptoms. She had been on Eliquis for a month, which she stopped taking after running out. She was also on metoprolol and flecainide, which were helping to manage her symptoms.  The patient expressed interest in seeing an electrophysiologist for a potential ablation procedure to reduce the risk of further atrial fibrillation episodes. She reported a previous symptomatic episode that required emergency treatment, including two injections and a drip in the hospital.  In addition to her cardiac concerns, the patient reported ongoing knee pain and was considering knee replacement surgery. She also reported stopping her Strattera medication for ADD and was considering starting Cymbalta for depression and nerve pain. She reported experiencing fibromyalgia-like symptoms, describing it as a feeling of a vice grip tightening and then releasing.  The patient was also dealing with the stress of moving to a new county and navigating changes in her insurance coverage. She expressed a preference for staying within the current healthcare system due to her positive experiences.           Studies Reviewed: Marland Kitchen   EKG Interpretation Date/Time:  Tuesday November 02 2023 14:33:29 EST Ventricular Rate:  64 PR Interval:  154 QRS Duration:  98 QT Interval:  404 QTC Calculation: 416 R Axis:   -4  Text Interpretation: Normal sinus rhythm with sinus arrhythmia Normal ECG When compared with ECG of 27-Sep-2023 04:26, No significant change since last tracing Confirmed by Donato Schultz ([id number]) on 11/02/2023 2:44:56 PM    Results RADIOLOGY Coronary CT: Calcium score of 0 (2020) Cardiac PET: Normal (2020)  DIAGNOSTIC Echocardiogram: Normal pump function, EF 53% (01/19/2023) ECG today - normal QRS, NSR (Flecainide)  Risk Assessment/Calculations:            Physical Exam:   VS:  BP 122/86   Pulse 61   Ht 5\' 5"  (1.651 m)   Wt 233 lb 9.6 oz (106 kg)   LMP  (LMP Unknown)   SpO2 97%   BMI 38.87 kg/m    Wt Readings from Last 3 Encounters:  11/02/23 233 lb 9.6 oz (106 kg)  09/28/23 235 lb 4.8 oz (106.7 kg)  05/31/23 248 lb (112.5 kg)    GEN: Well nourished, well developed in no acute distress NECK: No JVD; No carotid bruits CARDIAC: RRR, no murmurs, no rubs, no gallops RESPIRATORY:  Clear to auscultation without rales, wheezing or rhonchi  ABDOMEN: Soft, non-tender, non-distended EXTREMITIES:  No edema; No deformity   ASSESSMENT AND PLAN: .    Assessment and Plan    Atrial Fibrillation   Recurrent atrial fibrillation with symptomatic episodes including palpitations and chest discomfort. Coronary CT in 2020 showed  a calcium score of zero, and a cardiac PET was normal. Normal pump function with EF of 53% as of 01/19/2023. Currently on Eliquis, metoprolol, and flecainide, which have been effective in controlling symptoms. Discussed potential for ablation to reduce recurrence and possibly discontinue medications. Explained that ablation involves a catheter to map and eliminate arrhythmogenic areas in the atria, reducing the risk of future episodes. Patient expressed  interest in ablation and understanding of the procedure.   - Order EKG   - Refer to electrophysiologist for ablation consultation    Knee Osteoarthritis   Severe knee osteoarthritis with significant pain and functional limitation. Planning for knee replacement surgery after addressing atrial fibrillation.   - Proceed with knee replacement surgery after atrial fibrillation management    Depression and Fibromyalgia   Depression and fibromyalgia with nerve pain. Previous treatment with Lyrica was discontinued. Rheumatologist recommended Cymbalta for both depression and nerve pain management.   - Start Cymbalta for depression and fibromyalgia    Attention Deficit Disorder (ADD)   ADD previously managed with Strattera. Patient prefers Strattera over other stimulants like Ritalin and Adderall due to better symptom control.   - Restart Strattera    General Health Maintenance   Discussed the importance of maintaining regular follow-ups and managing chronic conditions. Patient is considering changes to her insurance plan to ensure continued care with current providers.   - Review and adjust insurance plan to ensure coverage with current healthcare providers    Follow-up   - Follow up with electrophysiologist   - Schedule follow-up appointment for EKG results review.               Signed, Donato Schultz, MD

## 2023-11-02 NOTE — Patient Instructions (Signed)

## 2023-11-10 ENCOUNTER — Telehealth (HOSPITAL_COMMUNITY): Payer: Medicare Other | Admitting: Student

## 2023-11-10 DIAGNOSIS — G4733 Obstructive sleep apnea (adult) (pediatric): Secondary | ICD-10-CM

## 2023-11-10 DIAGNOSIS — M797 Fibromyalgia: Secondary | ICD-10-CM

## 2023-11-10 DIAGNOSIS — F900 Attention-deficit hyperactivity disorder, predominantly inattentive type: Secondary | ICD-10-CM

## 2023-11-10 DIAGNOSIS — F4322 Adjustment disorder with anxiety: Secondary | ICD-10-CM

## 2023-11-10 DIAGNOSIS — F419 Anxiety disorder, unspecified: Secondary | ICD-10-CM

## 2023-11-10 DIAGNOSIS — Z8659 Personal history of other mental and behavioral disorders: Secondary | ICD-10-CM

## 2023-11-10 MED ORDER — DULOXETINE HCL 30 MG PO CPEP
30.0000 mg | ORAL_CAPSULE | Freq: Every day | ORAL | 2 refills | Status: DC
Start: 2023-11-10 — End: 2023-12-20
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
## 2023-11-17 ENCOUNTER — Encounter (HOSPITAL_COMMUNITY): Payer: Self-pay | Admitting: Student

## 2023-11-17 DIAGNOSIS — F4322 Adjustment disorder with anxiety: Secondary | ICD-10-CM | POA: Insufficient documentation

## 2023-11-17 DIAGNOSIS — Z8659 Personal history of other mental and behavioral disorders: Secondary | ICD-10-CM | POA: Insufficient documentation

## 2023-11-17 HISTORY — DX: Adjustment disorder with anxiety: F43.22

## 2023-11-17 NOTE — Addendum Note (Signed)
Addended by: Everlena Cooper on: 11/17/2023 09:50 AM   Modules accepted: Level of Service

## 2023-11-19 ENCOUNTER — Encounter (HOSPITAL_COMMUNITY): Payer: Self-pay | Admitting: Clinical

## 2023-11-19 ENCOUNTER — Ambulatory Visit (INDEPENDENT_AMBULATORY_CARE_PROVIDER_SITE_OTHER): Payer: Medicaid Other | Admitting: Clinical

## 2023-11-19 DIAGNOSIS — F4310 Post-traumatic stress disorder, unspecified: Secondary | ICD-10-CM | POA: Diagnosis not present

## 2023-11-19 DIAGNOSIS — F900 Attention-deficit hyperactivity disorder, predominantly inattentive type: Secondary | ICD-10-CM

## 2023-11-19 DIAGNOSIS — F331 Major depressive disorder, recurrent, moderate: Secondary | ICD-10-CM | POA: Diagnosis not present

## 2023-11-19 DIAGNOSIS — R632 Polyphagia: Secondary | ICD-10-CM

## 2023-11-19 DIAGNOSIS — F4522 Body dysmorphic disorder: Secondary | ICD-10-CM | POA: Diagnosis not present
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
## 2023-11-23 ENCOUNTER — Other Ambulatory Visit: Payer: Self-pay | Admitting: Cardiology

## 2023-11-26 ENCOUNTER — Telehealth: Payer: Self-pay

## 2023-11-26 NOTE — Telephone Encounter (Signed)
Callback team please contact requesting provider for guidance on holding Eliquis prior to left total knee arthroplasty.

## 2023-11-26 NOTE — Telephone Encounter (Signed)
   Pre-operative Risk Assessment    Patient Name: Dawn Jordan  DOB: [birth year] MRN: [id number]    LOV: 11/02/23 - DR. SKAINS NOV: 12/13/23 - DR. [facility]   Request for Surgical Clearance    Procedure:   LEFT TOTAL KNEE ARTHOPLASTY  Date of Surgery:  Clearance 03/06/24                                 Surgeon:  DR. Ollen Gross Surgeon's Group or Practice Name:  [facility] Phone number:  [phone] Fax number:  [phone]   Type of Clearance Requested:   - Medical    Type of Anesthesia:   CHOICE    Additional requests/questions:    Sandre Kitty   11/26/2023, 4:49 PM

## 2023-11-29 NOTE — Telephone Encounter (Signed)
Tried to reach office but no one answered. Will try again later.

## 2023-11-30 ENCOUNTER — Telehealth (HOSPITAL_COMMUNITY): Payer: Self-pay | Admitting: *Deleted

## 2023-11-30 NOTE — Telephone Encounter (Signed)
Pt called requesting to restart Strattera. Per Dr. Olene Craven note from 11/10/23 it looks like this was discussed however possibly waiting for EP to sign off on it. Pt does not have a f/u with [name] currently scheduled. Please review and advise.

## 2023-11-30 NOTE — Telephone Encounter (Signed)
Spoke with requesting office and they state that they do not have any recommendations on how long patient should hold Eliquis.The office prefers for the cardiologist to make recommendations and they will follow those instructions

## 2023-11-30 NOTE — Telephone Encounter (Signed)
Preoperative team, patient's apixaban is not prescribed by cardiology.  Recommendations for holding apixaban will need to come from prescribing provider.  Patient has upcoming appointment with Dr. Nelly Laurence on 12/13/2023.  Preoperative cardiac evaluation has already been added to appointment note.  I will remove Ms. Geerts from the preoperative pool.  Thank you for your help.  Thomasene Ripple. Cristal Howatt NP-C     11/30/2023, 9:28 AM [facility] [address] Office [phone] Fax [phone]

## 2023-12-06 ENCOUNTER — Other Ambulatory Visit (HOSPITAL_COMMUNITY): Payer: Self-pay | Admitting: Student

## 2023-12-07 ENCOUNTER — Encounter: Payer: Self-pay | Admitting: Cardiovascular Disease

## 2023-12-07 ENCOUNTER — Telehealth (HOSPITAL_COMMUNITY): Payer: Self-pay | Admitting: *Deleted

## 2023-12-07 NOTE — Telephone Encounter (Signed)
 Pt called with c/o the Cymbalta  30 mg is making her sick with extreme nausea and dry heaves since restarting medication on 11/10/23. Pt denies any other symptoms currently but states that a couple of weeks ago she had some GI distress and at that time she stopped taking the Cymbalta  . Since restarting for several days she says she's had these symptoms. Pt f/u scheduled for 12/20/23. Please review.

## 2023-12-07 NOTE — Telephone Encounter (Signed)
 Thank you! LVM for pt. Estella was encouraged to call back with any questions or concerns.

## 2023-12-09 NOTE — Telephone Encounter (Signed)
 When Cymbalta  first restarted had diarrhea, nausea, and dry heaves. Stopped taking it, but found out spouse was also sick. Believed it to not be attributed to medication, but again became ill again when restarted on 12/29 x 3 days. Spouse did not become ill that time, so did believe attributed to medication. Appetite beginning to restore. Will formally discontinue at this time. Will not restart any agents at this time. Will wait to discuss at next visit. Patient wants to ensure attention is given to focus.   Charmaine Myrtle, MD PGY-3 12/09/2023  5:14 PM [facility]

## 2023-12-10 ENCOUNTER — Other Ambulatory Visit: Payer: Self-pay | Admitting: Family Medicine

## 2023-12-12 IMAGING — CT CT CTA ABD/PEL W/CM AND/OR W/O CM
3 of 9 series · 12 of 46 positions shown, 18 images · IV contrast (APPLIED)
Comparison: Noncontrast CT abdomen and pelvis 02/15/2022

CLINICAL DATA: Aneurysm

EXAM:
CTA ABDOMEN AND PELVIS WITHOUT AND WITH CONTRAST
TECHNIQUE: Multidetector CT imaging of the abdomen and pelvis was performed
using the standard protocol during bolus administration of
intravenous contrast. Multiplanar reconstructed images and MIPs were
obtained and reviewed to evaluate the vascular anatomy.

[Series 4: arterial · axial · arterial · 0.87mm/px · z∈[-503,-287]mm · 5 of 243 slices shown]
[im 27/243  soft-tissue]
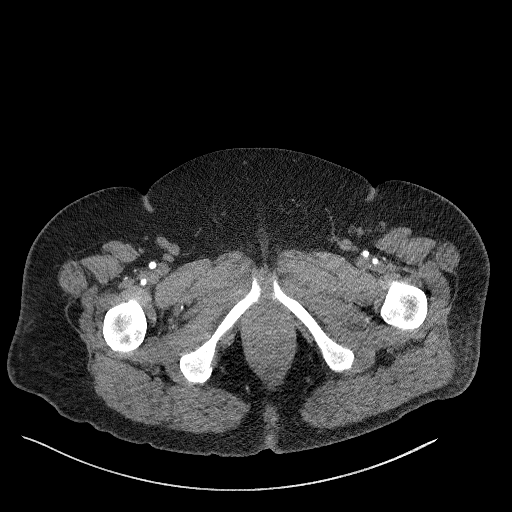
[im 54/243  soft-tissue]
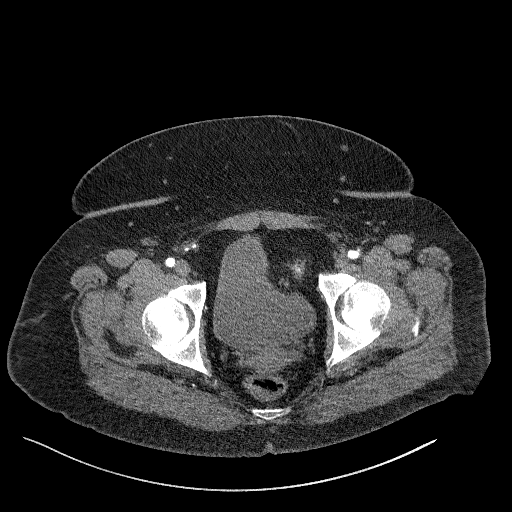
[im 81/243  soft-tissue]
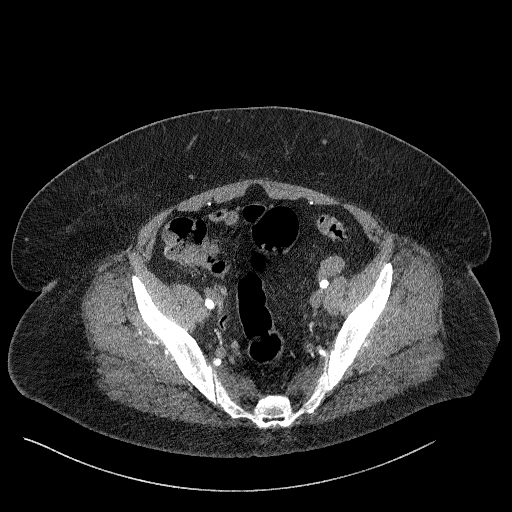
[im 108/243  soft-tissue]
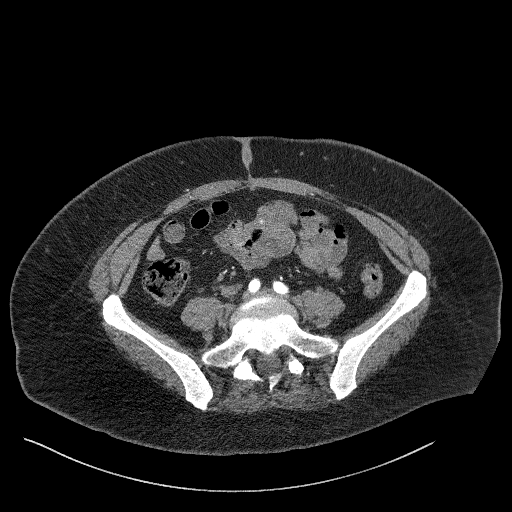
[im 135/243  soft-tissue]
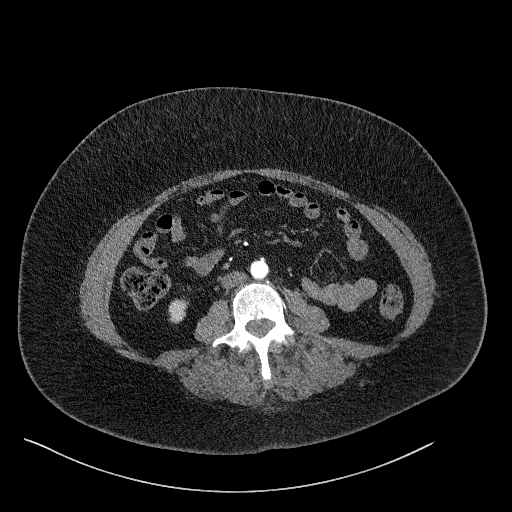

[Series 7: cor art · coronal · 0.93mm/px · 2 of 168 slices shown, 3 images]
[im 56/168  soft-tissue]
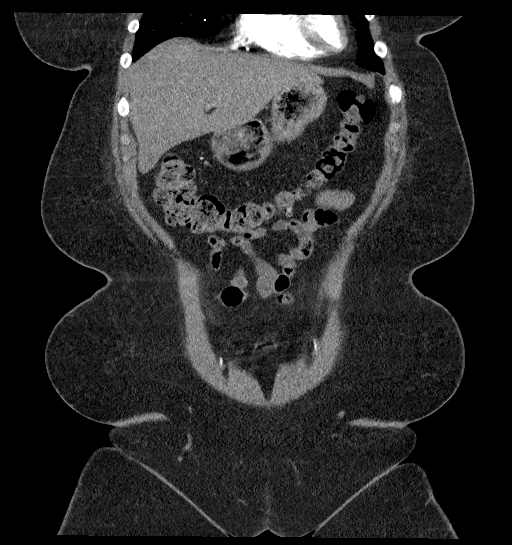
[im 56/168  bone]
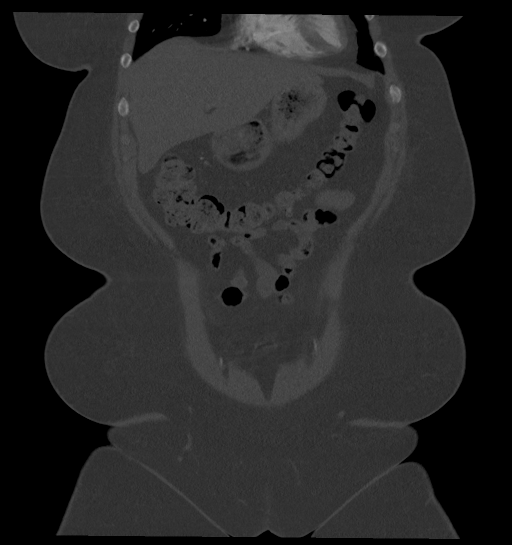
[im 112/168  soft-tissue]
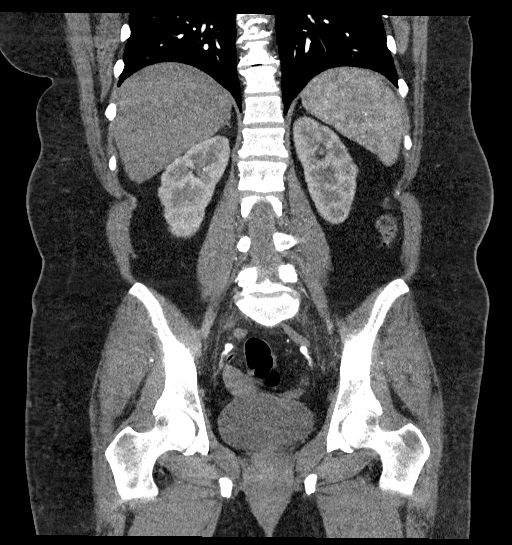

[Series 11: portal venous · axial · portal-venous · 0.87mm/px · z∈[-471,-151]mm · 5 of 97 slices shown, 10 images]
[im 17/97  soft-tissue]
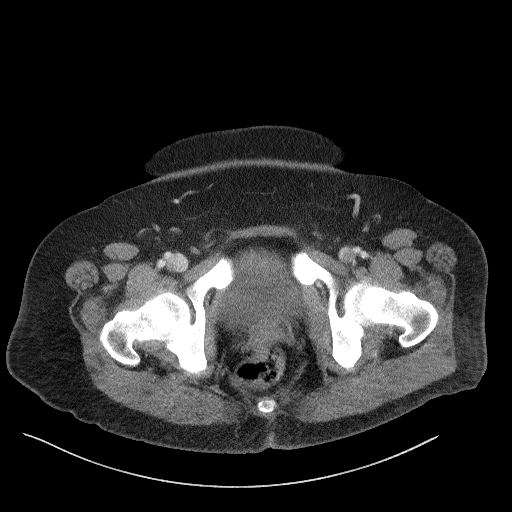
[im 17/97  bone]
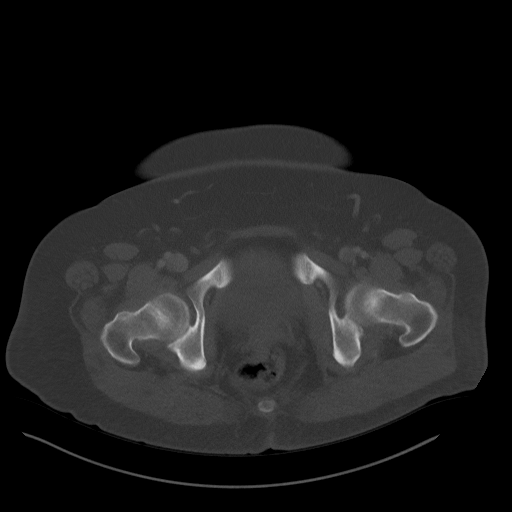
[im 33/97  soft-tissue]
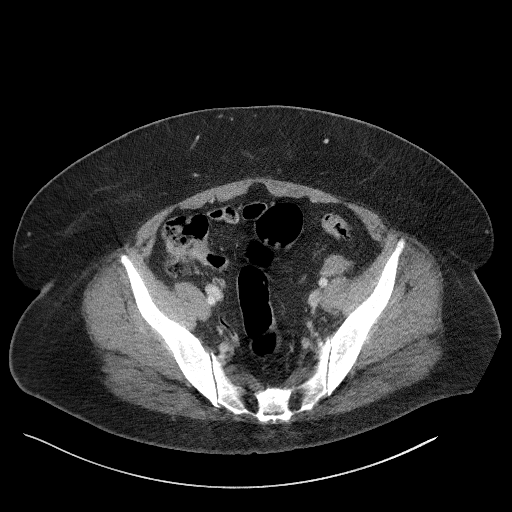
[im 33/97  lung]
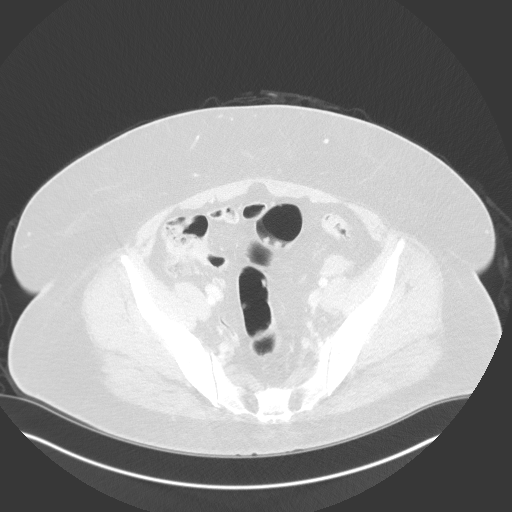
[im 49/97  soft-tissue]
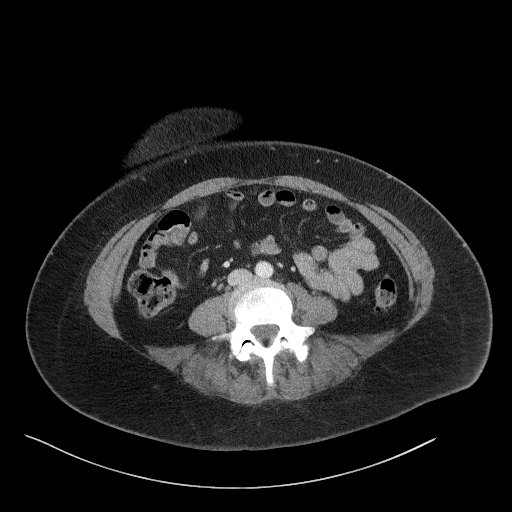
[im 49/97  lung]
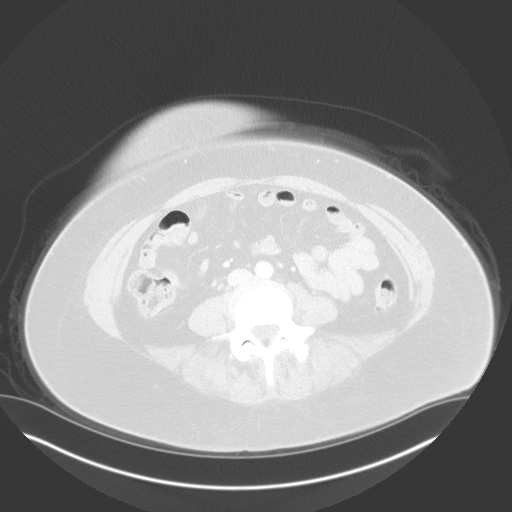
[im 65/97  soft-tissue]
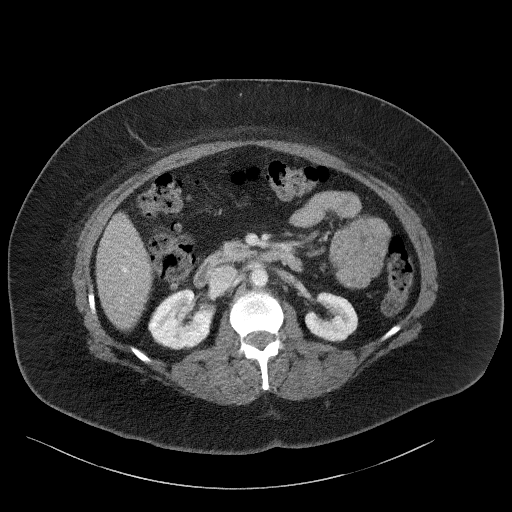
[im 65/97  lung]
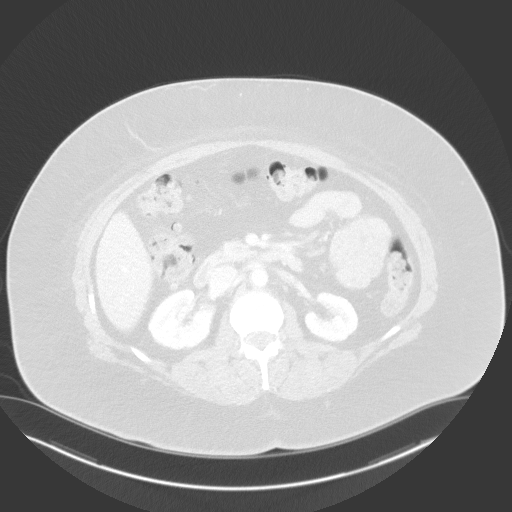
[im 81/97  soft-tissue]
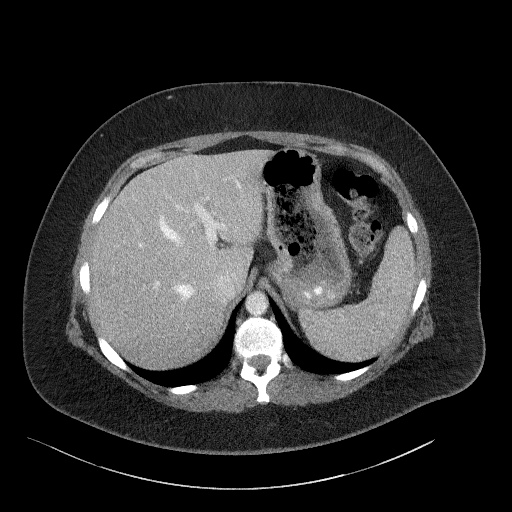
[im 81/97  lung]
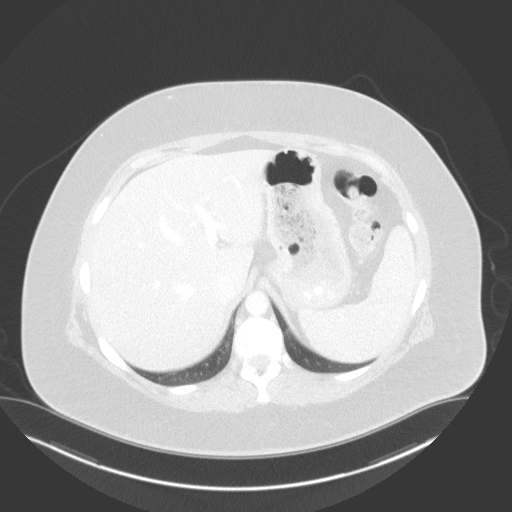

[12 of 46 positions shown; findings below may reference images not displayed]

RADIATION DOSE REDUCTION: This exam was performed according to the
departmental dose-optimization program which includes automated
exposure control, adjustment of the mA and/or kV according to
patient size and/or use of iterative reconstruction technique.

CONTRAST:  100mL OMNIPAQUE IOHEXOL 350 MG/ML SOLN IV
FINDINGS: VASCULAR

Aorta: Normal caliber without aneurysm or dissection. No plaque
formation.

Celiac: Normal caliber, widely patent, no plaque

SMA: Normal caliber, widely patent, no plaque

Renals: BILATERAL renal arteries normal caliber, widely patent, no
plaque

IMA: Normal caliber, widely patent

Inflow: Normal caliber, widely patent, no plaque

Proximal Outflow: Normal caliber, widely patent, no plaque

Veins: Patent

Review of the MIP images confirms the above findings.

NON-VASCULAR

Lower chest: Basilar lung nodules as noted on prior noncontrast CT,
please refer to preceding exam.

Hepatobiliary: Gallbladder surgically absent.  Liver unremarkable.

Pancreas: Atrophic without mass

Spleen: Normal appearance

Adrenals/Urinary Tract: Adrenal glands, kidneys, ureters, and
bladder normal appearance

Stomach/Bowel: Distal colonic diverticulosis without evidence of
diverticulitis. Stomach and remaining bowel loops unremarkable.
Normal appendix.

Lymphatic: No adenopathy.

Reproductive: Unremarkable uterus and ovaries

Other: No free air or free fluid. No hernia or inflammatory process.

Musculoskeletal: Unremarkable
IMPRESSION: No evidence of abdominal aortic aneurysm or dissection.

Distal colonic diverticulosis without evidence of diverticulitis.

No acute intra-abdominal or intrapelvic process.

Multiple pulmonary nodules.

Please refer to preceding noncontrast CT abdomen and pelvis of
02/15/2022 for follow-up recommendations.

## 2023-12-12 IMAGING — CT CT RENAL STONE PROTOCOL
2 of 4 series · 16 of 46 positions shown, 18 images · non-contrast
Comparison: 04/24/2020 ultrasound

CLINICAL DATA: 55-year-old female with acute LEFT flank, abdominal
and pelvic pain for 1 day.



[Series 2: stone full · axial · 0.86mm/px · z∈[-486,-46]mm · 13 of 97 slices shown, 15 images]
[im 5/97  soft-tissue]
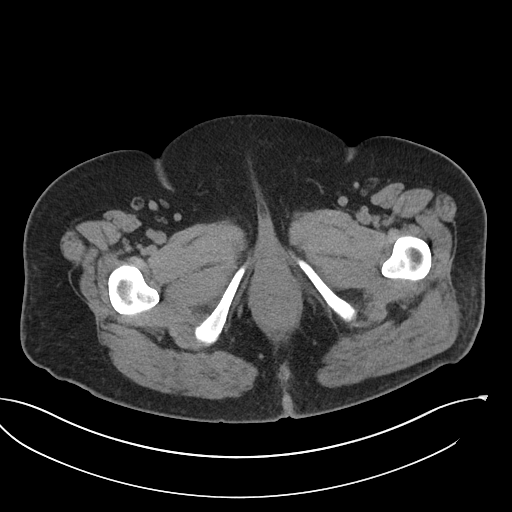
[im 5/97  bone]
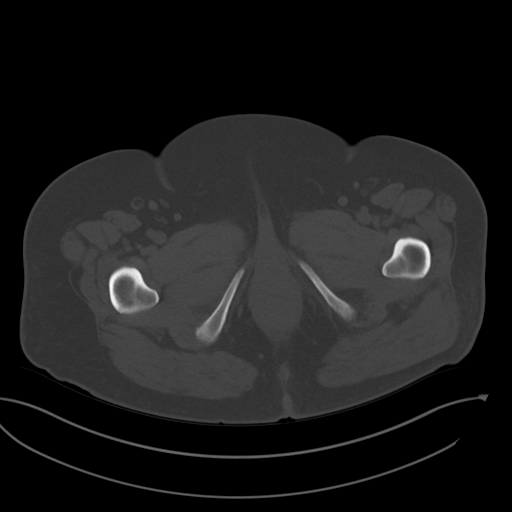
[im 13/97  soft-tissue]
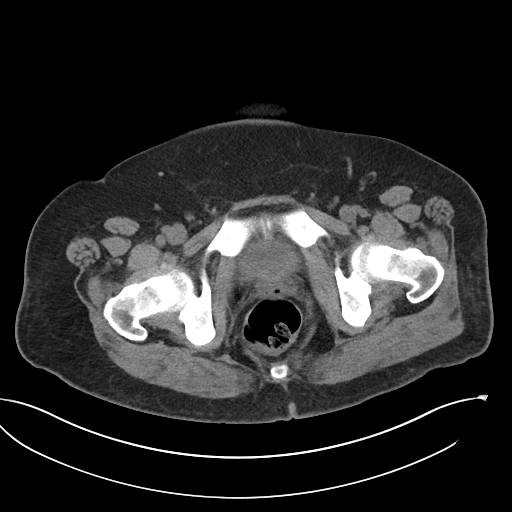
[im 21/97  soft-tissue]
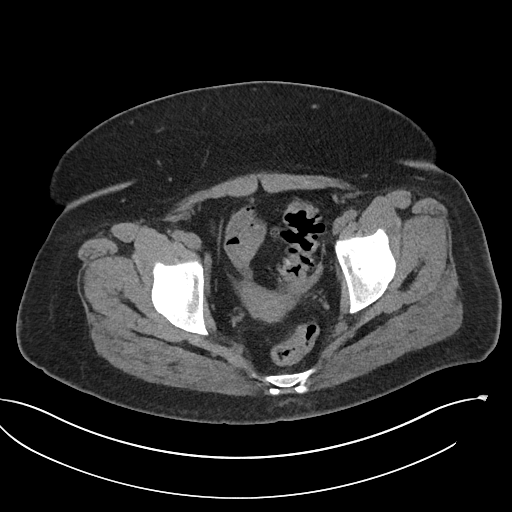
[im 29/97  soft-tissue]
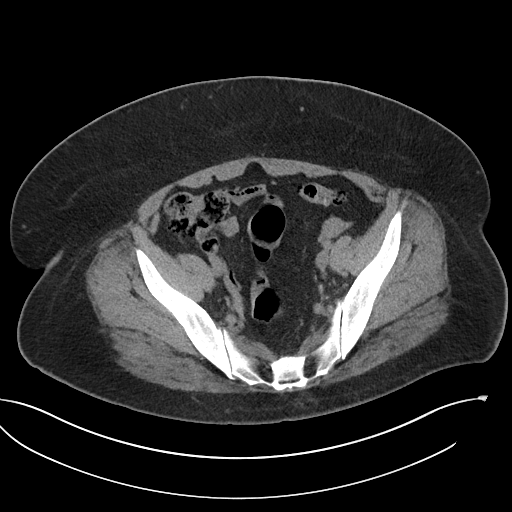
[im 33/97  soft-tissue]
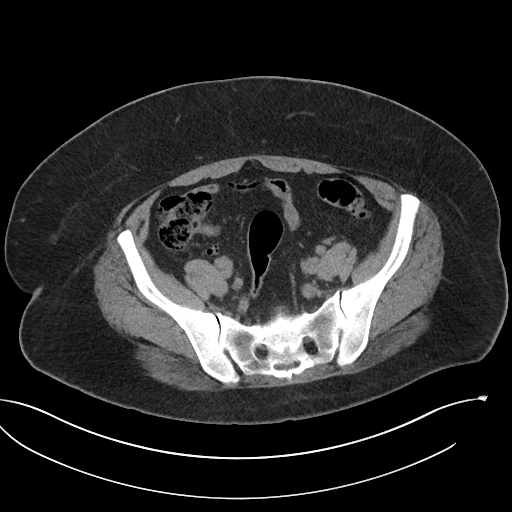
[im 41/97  soft-tissue]
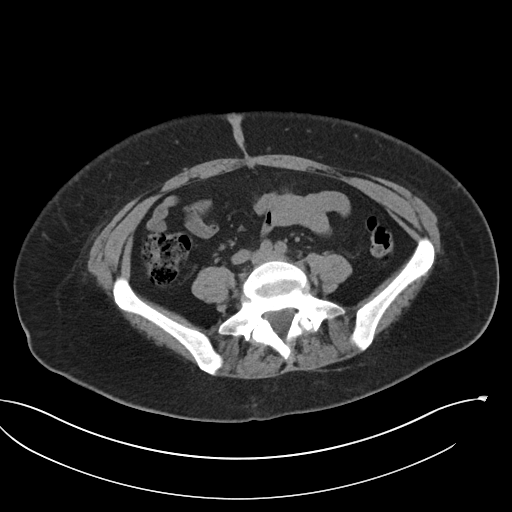
[im 49/97  soft-tissue]
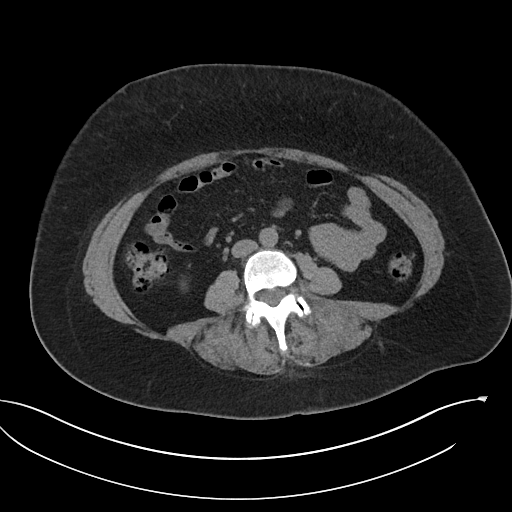
[im 57/97  soft-tissue]
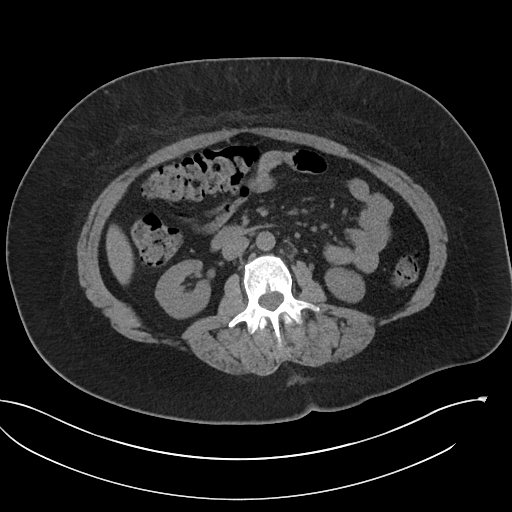
[im 65/97  soft-tissue]
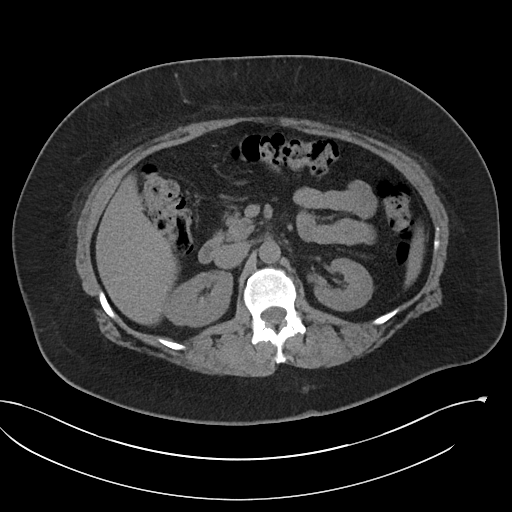
[im 65/97  bone]
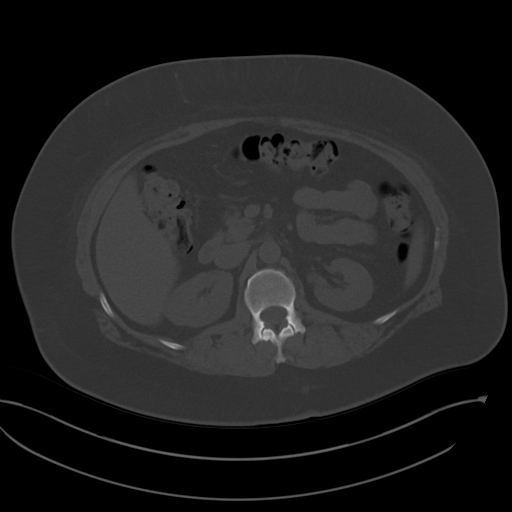
[im 69/97  soft-tissue]
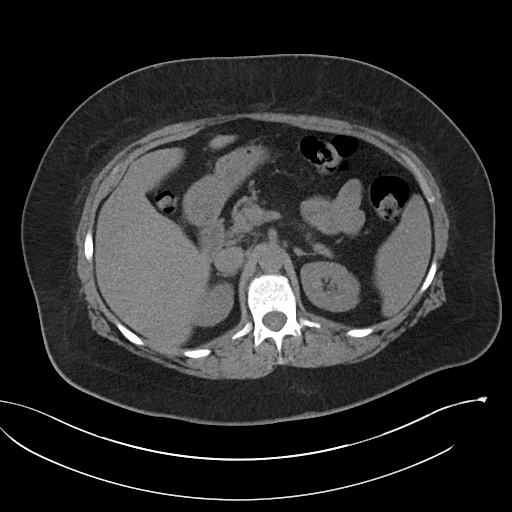
[im 77/97  soft-tissue]
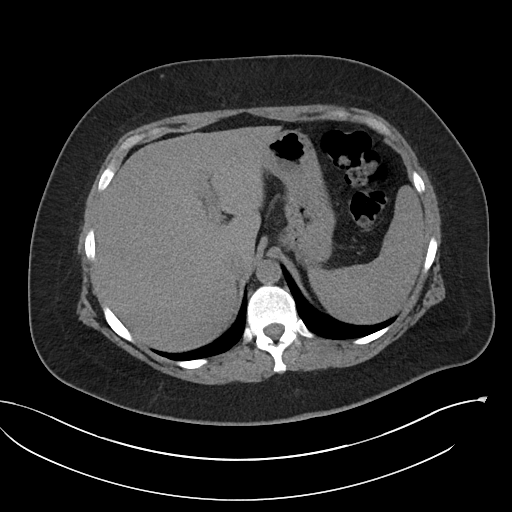
[im 85/97  soft-tissue]
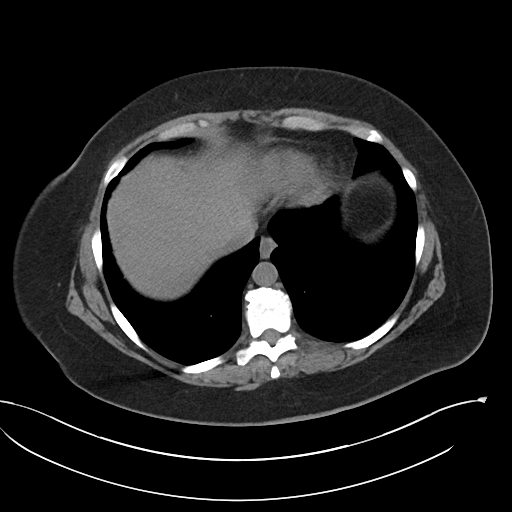
[im 93/97  soft-tissue]
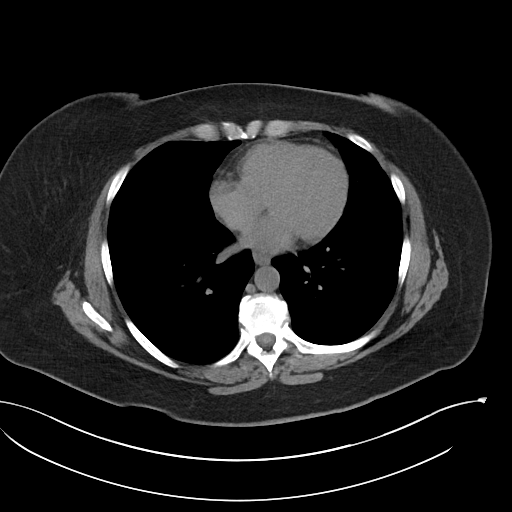

[Series 5: coronal · coronal · 0.90mm/px · 3 of 108 slices shown]
[im 36/108  soft-tissue]
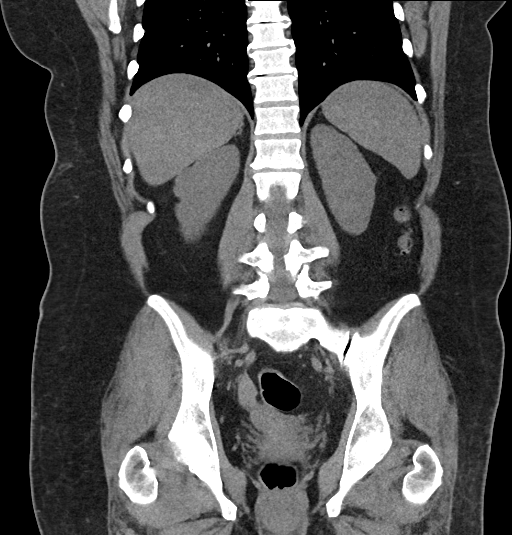
[im 48/108  soft-tissue]
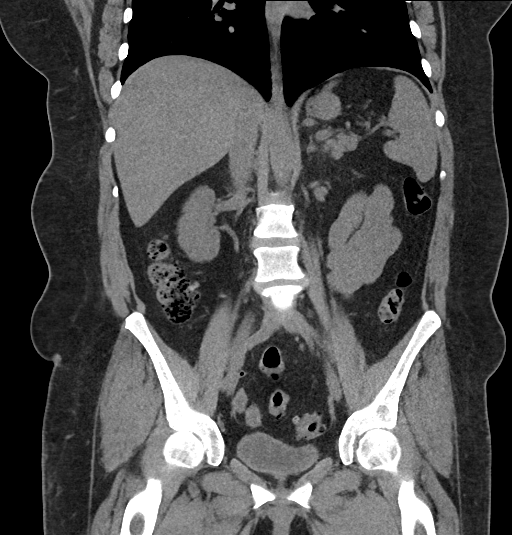
[im 60/108  soft-tissue]
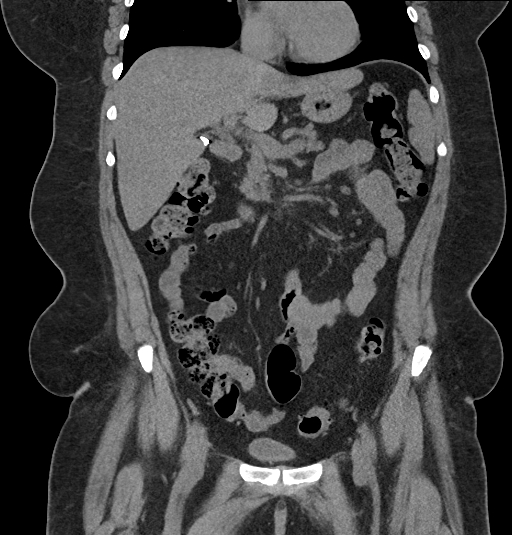

[16 of 46 positions shown; findings below may reference images not displayed]

FINDINGS: Please note that parenchymal and vascular abnormalities may be
missed as intravenous contrast was not administered.

Lower chest: A 6 mm RIGHT LOWER lobe nodule (series 4: Image 5) and
a 4 mm LEFT LOWER lobe ground-glass nodule is present ([DATE]).

No other significant abnormalities.

Hepatobiliary: The liver is unremarkable. The patient is status post
cholecystectomy. There is no evidence of intrahepatic or
extrahepatic biliary dilatation.

Pancreas: Unremarkable

Spleen: Unremarkable

Adrenals/Urinary Tract: The kidneys, adrenal glands and bladder are
unremarkable. No hydronephrosis or obstructing urinary calculi.

Stomach/Bowel: Stomach is within normal limits. Appendix appears
normal. No evidence of bowel wall thickening, distention, or
inflammatory changes.

Vascular/Lymphatic: Aortic atherosclerosis. No enlarged abdominal or
pelvic lymph nodes.

Reproductive: Uterus and bilateral adnexa are unremarkable.

Other: No ascites, focal collection or pneumoperitoneum.

Musculoskeletal: No acute or suspicious bony abnormalities are
noted.
IMPRESSION: 1. No acute abnormality.
2. 6 mm RIGHT LOWER lobe nodule and 4 mm LEFT LOWER lobe
ground-glass nodule. Non-contrast chest CT at 3-6 months is
recommended. If nodules persist, subsequent management will be based
upon the most suspicious nodule(s). This recommendation follows the
consensus statement: Guidelines for Management of Incidental
Pulmonary Nodules Detected on CT Images: From the [HOSPITAL]
3. Aortic Atherosclerosis ([id number]).

## 2023-12-13 ENCOUNTER — Ambulatory Visit: Payer: Medicare Other | Attending: Cardiovascular Disease | Admitting: Cardiovascular Disease

## 2023-12-13 ENCOUNTER — Ambulatory Visit (HOSPITAL_COMMUNITY): Payer: Medicare Other | Admitting: Clinical

## 2023-12-13 ENCOUNTER — Encounter: Payer: Self-pay | Admitting: Cardiovascular Disease

## 2023-12-13 ENCOUNTER — Encounter (HOSPITAL_COMMUNITY): Payer: Self-pay | Admitting: Clinical

## 2023-12-13 VITALS — BP 124/80 | HR 66 | Ht 65.0 in | Wt 233.2 lb

## 2023-12-13 DIAGNOSIS — F4522 Body dysmorphic disorder: Secondary | ICD-10-CM

## 2023-12-13 DIAGNOSIS — I480 Paroxysmal atrial fibrillation: Secondary | ICD-10-CM | POA: Diagnosis not present

## 2023-12-13 DIAGNOSIS — F4310 Post-traumatic stress disorder, unspecified: Secondary | ICD-10-CM | POA: Diagnosis not present

## 2023-12-13 DIAGNOSIS — F900 Attention-deficit hyperactivity disorder, predominantly inattentive type: Secondary | ICD-10-CM

## 2023-12-13 DIAGNOSIS — F331 Major depressive disorder, recurrent, moderate: Secondary | ICD-10-CM | POA: Diagnosis not present

## 2023-12-13 DIAGNOSIS — R632 Polyphagia: Secondary | ICD-10-CM

## 2023-12-13 DIAGNOSIS — R002 Palpitations: Secondary | ICD-10-CM | POA: Diagnosis present
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
## 2023-12-13 NOTE — Patient Instructions (Signed)
 Medication Instructions:  Your physician recommends that you continue on your current medications as directed. Please refer to the Current Medication list given to you today. *If you need a refill on your cardiac medications before your next appointment, please call your pharmacy*   Follow-Up: At [facility], you and your health needs are our priority.  As part of our continuing mission to provide you with exceptional heart care, we have created designated Provider Care Teams.  These Care Teams include your primary Cardiologist (physician) and Advanced Practice Providers (APPs -  Physician Assistants and Nurse Practitioners) who all work together to provide you with the care you need, when you need it.  We recommend signing up for the patient portal called "MyChart".  Sign up information is provided on this After Visit Summary.  MyChart is used to connect with patients for Virtual Visits (Telemedicine).  Patients are able to view lab/test results, encounter notes, upcoming appointments, etc.  Non-urgent messages can be sent to your provider as well.   To learn more about what you can do with MyChart, go to [email or web address].    Your next appointment:   3 month(s)  Provider:   York Pellant, MD

## 2023-12-13 NOTE — Progress Notes (Signed)
 Electrophysiology Office Note:    Date:  12/13/2023   ID:  Dawn Dawn, DOB [birth year], MRN [id number]  PCP:  Dawn, Betty G, MD   [facility] Providers Cardiologist:  Oneil Parchment, MD     Referring MD: Parchment Oneil BROCKS, MD   History of Present Illness:    Dawn Dawn is a 58 y.o. adult with a medical history significant for atrial fibrillation, referred for possible atrial fibrillation ablation.     I discussed the use of AI scribe software for clinical note transcription with the patient, who gave verbal consent to proceed.  The patient, with a history of atrial fibrillation (AFib), presents with intermittent episodes of chest pain, heart palpitations, and shortness of breath. These symptoms have been ongoing for a few years and have been disruptive to the patient's daily life. The patient reports that the symptoms often occur at night, causing them to wake up.  AF was initially difficult to diagnose.  A nocturnal episode lasting about 2 hours was detected on a prior monitor in 2020.  The patient has been on flecainide , which has helped manage the symptoms.  She weaned off the flecainide  and had a recurrence of atrial fibrillation with RVR resulting in chest pressure in October 2024.  ECG documenting AF was captured in the ER.  Flecainide  was resumed and she has remained A-fib free since.  The patient also reports a history of panic attacks, which they differentiate from their AFib symptoms. The patient is scheduled for a knee replacement surgery in the near future and has been actively trying to lose weight.     Today, she reports that she is doing well.  No recent palpitations on flecainide   EKGs/Labs/Other Studies Reviewed Today:     Echocardiogram:  TTE 09/27/2023 EF 60-65%. Normal biatrial size   Monitors:  14 day monitor 04/2023  -- my interpretation Sinus rhythm HR 48-140 bpm, avg 70 No AF detected. Rare ectopy, with brief SVT  Stress testing:  January 19, 2023 No ischemia  Advanced imaging:  Cardiac PET 01/19/2023 Normal, low-risk study   EKG:   EKG Interpretation Date/Time:  Monday December 13 2023 11:09:31 EST Ventricular Rate:  66 PR Interval:  156 QRS Duration:  96 QT Interval:  390 QTC Calculation: 408 R Axis:   -10  Text Interpretation: Normal sinus rhythm Normal ECG When compared with ECG of 02-Nov-2023 14:33, No significant change was found Confirmed by Nancey Scotts [phone]) on 12/13/2023 11:37:11 AM     Physical Exam:    VS:  BP 124/80 (BP Location: Left Arm, Patient Position: Sitting, Cuff Size: Large)   Pulse 66   Ht 5' 5 (1.651 m)   Wt 233 lb 3.2 oz (105.8 kg)   LMP  (LMP Unknown)   SpO2 98%   BMI 38.81 kg/m     Wt Readings from Last 3 Encounters:  12/13/23 233 lb 3.2 oz (105.8 kg)  11/02/23 233 lb 9.6 oz (106 kg)  09/28/23 235 lb 4.8 oz (106.7 kg)     GEN: Well nourished, well developed in no acute distress CARDIAC: RRR, no murmurs, rubs, gallops RESPIRATORY:  Normal work of breathing MUSCULOSKELETAL: no edema    ASSESSMENT & PLAN:     Paroxysmal atrial fibrillation Symptomatic, with RVR and difficult to control rates Appears to be well controlled with flecainide  I think ablation is a reasonable goal, but she does have some increased procedural risk with a BMI of 38. She has a TKA scheduled in March  Continue flecainide  for now.  Will evaluate after her knee surgery.  Secondary hypercoagulable state Continue apixaban  5 mg twice daily  Anticipated TKA From electrophysiology standpoint, I do not see any contraindication for the surgery. She should remain on flecainide  and metoprolol . Apixaban  may be held 2 days prior to surgery.  Obesity We discussed the increased procedural risk of ablation with obesity She is actively losing weight and has plans to continue working on this in the future Her candidacy for ablation will depend upon progress at her next visit    Signed, Eulas FORBES Furbish, MD  12/13/2023 12:02 PM    [facility]

## 2023-12-15 ENCOUNTER — Ambulatory Visit (INDEPENDENT_AMBULATORY_CARE_PROVIDER_SITE_OTHER): Payer: Medicare Other | Admitting: Clinical

## 2023-12-15 ENCOUNTER — Telehealth (HOSPITAL_COMMUNITY): Payer: Self-pay | Admitting: Licensed Clinical Social Worker

## 2023-12-15 ENCOUNTER — Encounter (HOSPITAL_COMMUNITY): Payer: Self-pay | Admitting: Clinical

## 2023-12-15 DIAGNOSIS — F900 Attention-deficit hyperactivity disorder, predominantly inattentive type: Secondary | ICD-10-CM

## 2023-12-15 DIAGNOSIS — F4522 Body dysmorphic disorder: Secondary | ICD-10-CM | POA: Diagnosis not present

## 2023-12-15 DIAGNOSIS — F331 Major depressive disorder, recurrent, moderate: Secondary | ICD-10-CM | POA: Diagnosis not present

## 2023-12-15 DIAGNOSIS — F4310 Post-traumatic stress disorder, unspecified: Secondary | ICD-10-CM

## 2023-12-15 DIAGNOSIS — R632 Polyphagia: Secondary | ICD-10-CM

## 2023-12-15 NOTE — Telephone Encounter (Signed)
 See call intake

## 2023-12-15 NOTE — Progress Notes (Signed)
 Patient ID: Trinity Haun, adult   DOB: Oct 28, 1966, 58 y.o.   MRN: [id number]  Therapy Group Progress Note   12/15/2023   Group Time:  5:00pm-6:00pm  Participation Level:  Active   Behavioral Response: Appropriate, Sharing, and Care-Taking   Type of Therapy: Group Therapy   Check-In:  Denajah Farias shared that she cannot concentrate and is often is very anxious due to overthinking, adding everything creates stress.    Current Concerns Shared:  She said she often self-sabotages as a result.  She asks herself What if, what if, what if constantly.  She was very supportive of others as they shared their current concerns and even offered to be available to them.  Intervention(s):  CSW shared on-screen a pictorial handout about types of boundaries, including porous, rigid, and healthy.  Emphasis was placed on the various ways in which the word no is treated by people who are using these different types.  Porous boundaries tend to lead someone to not be able to say no even when desired/needed, rigid boundaries tend to lead someone to automatically say no regardless of situation, and healthy boundaries lead someone to be able to say no when appropriate.  CSW emphasized that if we wish to change the current boundaries in a relationship, it is only fair to tell the other person what the new expectations are so that they have the opportunity to choose to respect them, if they wish, which would enable us  to then experience positive growth in that relationship.  How our boundaries style also affect how we handle conflict was also described.  Boundaries and staying focused on one activity at a time were both discussed for the remainder of group by all of the patients.   Summary of Progress:  Hye Trawick verbalized fair understanding of concepts about boundaries as presented.  She stayed on-screen more than she usually does during virtual sessions individually.   She stated the group was supportive in her  opinion.  Recommendations:  It is recommended that Pernell Gertz continue considering all brought up by CSW and fellow patients throughout group and return to group at next scheduled time or to individual therapy at next scheduled session.  Progress Towards Goals: Progressing   Encounter Diagnoses  Name Primary?   MDD (major depressive disorder), recurrent episode, moderate (HCC) Yes   PTSD (post-traumatic stress disorder)    Attention deficit hyperactivity disorder (ADHD), predominantly inattentive type    Body dysmorphic disorder    Binge eating      Virtual Visit via Video Note  I connected with Norell Boyson on 12/15/23 at 5:00pm by [employer] Teams and verified that I am speaking with the correct person using two identifiers.  Location: Patient: home Provider: [facility]   I discussed the limitations of evaluation and management by telemedicine and the availability of in person appointments. The patient expressed understanding and agreed to proceed.   I discussed the assessment and treatment plan with the patient. The patient was provided an opportunity to ask questions and all were answered. The patient agreed with the plan and demonstrated an understanding of the instructions.   The patient was advised to call back or seek an in-person evaluation if the symptoms worsen or if the condition fails to improve as anticipated.  I provided 60 minutes of non-face-to-face time during this encounter.  Elgie Crest, LCSW 12/17/2023, 7:54 PM

## 2023-12-20 ENCOUNTER — Telehealth (HOSPITAL_COMMUNITY): Payer: Self-pay | Admitting: *Deleted

## 2023-12-20 ENCOUNTER — Encounter (HOSPITAL_COMMUNITY): Payer: Self-pay | Admitting: Student

## 2023-12-20 ENCOUNTER — Telehealth (HOSPITAL_BASED_OUTPATIENT_CLINIC_OR_DEPARTMENT_OTHER): Payer: Medicare Other | Admitting: Student

## 2023-12-20 DIAGNOSIS — F4310 Post-traumatic stress disorder, unspecified: Secondary | ICD-10-CM

## 2023-12-20 DIAGNOSIS — F331 Major depressive disorder, recurrent, moderate: Secondary | ICD-10-CM | POA: Diagnosis not present

## 2023-12-20 DIAGNOSIS — G4733 Obstructive sleep apnea (adult) (pediatric): Secondary | ICD-10-CM | POA: Diagnosis not present

## 2023-12-20 DIAGNOSIS — F411 Generalized anxiety disorder: Secondary | ICD-10-CM | POA: Diagnosis not present

## 2023-12-20 MED ORDER — SERTRALINE HCL 25 MG PO TABS: 25.0000 mg | ORAL_TABLET | Freq: Every day | ORAL | 1 refills | Status: DC

## 2023-12-20 NOTE — Telephone Encounter (Signed)
 Pt called after she was told by pharmacy that their is "an interaction" between the Zoloft the Tambocor. Please advise.

## 2023-12-20 NOTE — Addendum Note (Signed)
 Addended by: Everlena Cooper on: 12/20/2023 02:58 PM   Modules accepted: Level of Service
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
## 2023-12-20 NOTE — Telephone Encounter (Signed)
 Called patient regarding interactions. Noted that there is a moderate effect that the Zoloft  may have, increasing the effects of the Tambocor .  Advised of adverse effects of which patient should be aware, and to report to the nearest emergency department if these adverse effects are experienced.  Will have slower titration of medication, taking 25 mg x 2 weeks, then increasing to 50 mg daily.  Patient agreeable to this plan.  Dr. Rainelle

## 2023-12-22 ENCOUNTER — Encounter (HOSPITAL_COMMUNITY): Payer: Self-pay | Admitting: Clinical

## 2023-12-22 ENCOUNTER — Ambulatory Visit (HOSPITAL_COMMUNITY): Payer: Medicare Other | Admitting: Clinical

## 2023-12-22 DIAGNOSIS — F4310 Post-traumatic stress disorder, unspecified: Secondary | ICD-10-CM

## 2023-12-22 DIAGNOSIS — F900 Attention-deficit hyperactivity disorder, predominantly inattentive type: Secondary | ICD-10-CM | POA: Diagnosis not present

## 2023-12-22 DIAGNOSIS — F331 Major depressive disorder, recurrent, moderate: Secondary | ICD-10-CM | POA: Diagnosis not present

## 2023-12-22 DIAGNOSIS — F4522 Body dysmorphic disorder: Secondary | ICD-10-CM

## 2023-12-22 DIAGNOSIS — F411 Generalized anxiety disorder: Secondary | ICD-10-CM

## 2023-12-22 DIAGNOSIS — R632 Polyphagia: Secondary | ICD-10-CM

## 2023-12-22 NOTE — Progress Notes (Signed)
 Therapy Group Progress Note   12/22/2023   Group Time:  5:00pm-6:00pm  Participation Level:  Active   Behavioral Response: Sharing and Rationalizing   Type of Therapy: Group Therapy   Check-In:  Dawn Jordan shared that zey are experiencing nausea with initiation of Zoloft , which happened also in the past when zey started this medicine.  [name] were eating at the beginning of group in order to try to ward off the nausea, had also taken some Zofran .    Current Concerns Shared:  Dawn Jordan reported that zey keep getting triggered with jealousy over wife.  [name] were very open with the group about the fact that zey are obsessing over various things that happen surrounding wife, feeling zey have no control over zeir emotions.  [name] stated, "I need to learn how to respond when it rears up."  CBT was used as the basis of ideas provided by CSW.  CSW also recommended writing about it, which zey stated "I will never do, not in this house," and CSW told zem could actually be destroyed afterward.  CSW also recommended writing out what zey wish to say to wife, in order to work on it and make it more pleasant/palable, again stating that it could be destroyed later.    Intervention(s):  CSW reminded group members that much can be gained from a cognitive-behavioral approach.  CSW shared on-screen a simplified pictorial of the CBT process:  situation, thoughts, emotions, behaviors.  We reviewed this with general examples given, then suggestions were made as to how the topics under discussion could be seen more clearly with more positive feelings resulting through challenging our automatic thoughts.  The group responded favorably with several examples of how this could impact their feelings about what is going on in their lives currently.  Group members unanimously agreed that what we think tends to be the opposite of what is actually happening.     Summary of Progress:  Dawn Jordan verbalized full understanding of concepts about how  to challenge thoughts as presented.  [name] took notes about how to do this, and showed notes taken from last week's group as well.  [name] stated that zey have told wife zey are doing a lot of work to try to get better, including the extra therapy.   Recommendations:  It is recommended that Dawn Jordan continue considering all brought up by CSW and fellow patients throughout group and return to group at next scheduled time or to individual therapy at next scheduled session.  Progress Towards Goals: Progressing   Encounter Diagnoses  Name Primary?   MDD (major depressive disorder), recurrent episode, moderate (HCC) Yes   PTSD (post-traumatic stress disorder)    GAD (generalized anxiety disorder)    Attention deficit hyperactivity disorder (ADHD), predominantly inattentive type    Body dysmorphic disorder    Binge eating      Virtual Visit via Video Note  I connected withNAME@ on 12/22/23 at 5:00pm by [employer] Teams and verified that I am speaking with the correct person using two identifiers.  Location: Patient: Home Provider: [facility] Outpatient Therapy Office - [facility]   I discussed the limitations of evaluation and management by telemedicine and the availability of in person appointments. The patient expressed understanding and agreed to proceed.   I discussed the assessment and treatment plan with the patient. The patient was provided an opportunity to ask questions and all were answered. The patient agreed with the plan and demonstrated an understanding of the instructions.  The patient was advised to call back or seek an in-person evaluation if the symptoms worsen or if the condition fails to improve as anticipated.  I provided 60 minutes of non-face-to-face time during this encounter.  Leotha Rang, LCSW 12/22/2023, 12:03 PM

## 2023-12-27 ENCOUNTER — Telehealth (HOSPITAL_COMMUNITY): Payer: Self-pay | Admitting: *Deleted

## 2023-12-27 NOTE — Telephone Encounter (Signed)
Pt called with c/o Zoloft is causing intimacy problems. Pt states that they've never experienced this previously with any other meds. Last visit was on 12/20/23. No f/u scheduled.

## 2023-12-29 ENCOUNTER — Encounter (HOSPITAL_COMMUNITY): Payer: Self-pay | Admitting: Clinical

## 2023-12-29 ENCOUNTER — Ambulatory Visit (HOSPITAL_COMMUNITY): Payer: Medicare Other | Admitting: Clinical

## 2023-12-29 ENCOUNTER — Encounter (HOSPITAL_COMMUNITY): Payer: Self-pay | Admitting: Student

## 2023-12-29 DIAGNOSIS — F900 Attention-deficit hyperactivity disorder, predominantly inattentive type: Secondary | ICD-10-CM

## 2023-12-29 DIAGNOSIS — R632 Polyphagia: Secondary | ICD-10-CM

## 2023-12-29 DIAGNOSIS — F331 Major depressive disorder, recurrent, moderate: Secondary | ICD-10-CM

## 2023-12-29 DIAGNOSIS — F411 Generalized anxiety disorder: Secondary | ICD-10-CM

## 2023-12-29 DIAGNOSIS — F4522 Body dysmorphic disorder: Secondary | ICD-10-CM

## 2023-12-29 DIAGNOSIS — F4310 Post-traumatic stress disorder, unspecified: Secondary | ICD-10-CM

## 2023-12-29 NOTE — Progress Notes (Unsigned)
Therapy Group Progress Note   12/29/2023   Group Time:  5:00pm-6:00pm  Participation Level:  Active   Behavioral Response: Appropriate and Sharing   Type of Therapy: Group Therapy   Check-In:  Patient shared that zey had a bit of an issue with obsessive thinking/jealousy over wife on Sunday, but felt zey quickly got it under control.  [name] did not take medicine at some point for a day or two due to the side-effects, but when this "hiccup" happened, were convinced that the medicine was helping and zey should stay on it.  Current Concerns Shared:  Stated zey are always beating self down for "everything."  Is working to come up with ways to be able to speak positive words to self.  Is also working with some positive results to accept that both others and self can do things for zem that can be accepted graciously and not fought.  Intervention(s):  CSW shared on-screen a lyrics video of the song "I Am Enough" which group listened to then responded to individually, stating in general that is a helpful reminder that we are human and acceptable just as we are.  CSW provided psychoeducation about shame researchers finding that feelings of shame are often brought on by feelings of not being "enough" in one or more areas of life.  Personal experiences of this were processed.  Summary of Progress:  Dawn Jordan verbalized good understanding of concepts about shame as presented.  [name] making attempts to accept self by recently going for a pedicure and by taking care of zeir skin with acceptance that it is okay to spend that time.  [name] also recently laid down after taking care of mom's needs, instead of pushing through the nausea zey were feeling from the medications.  [name] expressed once again that the group setting is very helpful, especially in not feeling alone.  Recommendations:  It is recommended that Dawn Jordan continue considering all brought up by CSW and fellow patients throughout group and return to  group at next scheduled time or to individual therapy at next scheduled session.  Progress Towards Goals: Progressing   Encounter Diagnoses  Name Primary?   MDD (major depressive disorder), recurrent episode, moderate (HCC) Yes   PTSD (post-traumatic stress disorder)    GAD (generalized anxiety disorder)    Attention deficit hyperactivity disorder (ADHD), predominantly inattentive type    Body dysmorphic disorder    Binge eating      Virtual Visit via Video Note  I connected withNAME@ on 12/29/23 at 5:00pm by [employer] Teams and verified that I am speaking with the correct person using two identifiers.  Location: Patient: home Provider: home office   I discussed the limitations of evaluation and management by telemedicine and the availability of in person appointments. The patient expressed understanding and agreed to proceed.   I discussed the assessment and treatment plan with the patient. The patient was provided an opportunity to ask questions and all were answered. The patient agreed with the plan and demonstrated an understanding of the instructions.   The patient was advised to call back or seek an in-person evaluation if the symptoms worsen or if the condition fails to improve as anticipated.  I provided 60 minutes of non-face-to-face time during this encounter.  Ambrose Mantle, LCSW 12/29/2023, 6:33 PM

## 2023-12-29 NOTE — Telephone Encounter (Signed)
Thank you, will reach out.   Dr. Alfonse Flavors

## 2024-01-05 ENCOUNTER — Telehealth (INDEPENDENT_AMBULATORY_CARE_PROVIDER_SITE_OTHER): Payer: Medicare Other | Admitting: Clinical

## 2024-01-05 DIAGNOSIS — Z91199 Patient's noncompliance with other medical treatment and regimen due to unspecified reason: Secondary | ICD-10-CM

## 2024-01-05 NOTE — Progress Notes (Signed)
Therapy Progress Note  Patient was scheduled for group at 5:00pm on 01/05/2024.  She was sent an email with the [employer] Teams link.  When she did not show up, CSW sent her a text message.  There was no response.  Because it is unusual for her to not attend and because it is not yet known why she did not do so, this will be coded as a no-charge.  Encounter Diagnosis  Name Primary?   No-show for appointment Yes     Ambrose Mantle, LCSW 01/05/2024, 6:00 PM

## 2024-01-06 ENCOUNTER — Telehealth (HOSPITAL_COMMUNITY): Payer: Self-pay | Admitting: *Deleted
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
## 2024-01-07 ENCOUNTER — Encounter (HOSPITAL_COMMUNITY): Payer: Self-pay | Admitting: Student

## 2024-01-07 NOTE — Telephone Encounter (Signed)
Attempted to call x 2. VM left. Will send another message through MyChart to follow up, thanks.   Dr. Alfonse Flavors

## 2024-01-12 ENCOUNTER — Telehealth (HOSPITAL_COMMUNITY): Payer: Medicare Other | Admitting: Clinical

## 2024-01-12 ENCOUNTER — Other Ambulatory Visit (HOSPITAL_COMMUNITY): Payer: Self-pay | Admitting: Student

## 2024-01-12 ENCOUNTER — Encounter (HOSPITAL_COMMUNITY): Payer: Self-pay | Admitting: Clinical

## 2024-01-12 DIAGNOSIS — F411 Generalized anxiety disorder: Secondary | ICD-10-CM

## 2024-01-12 DIAGNOSIS — F4310 Post-traumatic stress disorder, unspecified: Secondary | ICD-10-CM | POA: Diagnosis not present

## 2024-01-12 DIAGNOSIS — F900 Attention-deficit hyperactivity disorder, predominantly inattentive type: Secondary | ICD-10-CM | POA: Diagnosis not present

## 2024-01-12 DIAGNOSIS — F4522 Body dysmorphic disorder: Secondary | ICD-10-CM

## 2024-01-12 DIAGNOSIS — R632 Polyphagia: Secondary | ICD-10-CM

## 2024-01-12 DIAGNOSIS — F331 Major depressive disorder, recurrent, moderate: Secondary | ICD-10-CM | POA: Diagnosis not present

## 2024-01-12 NOTE — Progress Notes (Signed)
 Therapy Group Progress Note   01/12/2024   Group Time:  5:00pm-6:00pm  Participation Level:  Active   Behavioral Response: Appropriate and Sharing   Type of Therapy: Group Therapy   Check-In:  Patient shared that photography, drawing and painting are her favorite coping skills that she is trying to increase using right now.  Current Concerns Shared:  Patient shared that her jealousy about wife still remains the biggest problem in her life.  Her wife now feels she has to walk around like on eggshells to avoid the things that trigger patient.  It sounds as though wife may be starting to experience some resentment about this.  Patient has continued to experience side-effects from the medicine and cannot connect with doctor in this practice.  She may start looking for a local psychiatric provider.  Intervention(s):  CSW shared that a commonality among those present seemed to be that everyone was experiencing something in their lives for the first time and this makes a person more vulnerable.  This was then used for the remainder of the group time to talk about the difficulties of being vulnerable, the potential pitfalls, as well as the possible benefits in the long run.   Summary of Progress:  Amberlie Gaillard verbalized full understanding of concepts about vulnerability as presented.  She shared that the most vulnerable she has ever been has been with her wife in telling her about her jealousy and the things that trigger it.  She has been very emotional lately, crying more than usual in front of wife as well.   Recommendations:  It is recommended that Geralene Heckstall continue considering all brought up by CSW and fellow patients throughout group and return to group at next scheduled time or to individual therapy at next scheduled session.  Progress Towards Goals: Progressing   Encounter Diagnoses  Name Primary?   MDD (major depressive disorder), recurrent episode, moderate (HCC) Yes   PTSD  (post-traumatic stress disorder)    GAD (generalized anxiety disorder)    Attention deficit hyperactivity disorder (ADHD), predominantly inattentive type    Body dysmorphic disorder    Binge eating      Virtual Visit via Video Note  I connected withNAME@ on 01/12/24 at 5:00pm by Microsoft Teams and verified that I am speaking with the correct person using two identifiers.  Location: Patient: home Provider: [facility] - [facility]   I discussed the limitations of evaluation and management by telemedicine and the availability of in person appointments. The patient expressed understanding and agreed to proceed.   I discussed the assessment and treatment plan with the patient. The patient was provided an opportunity to ask questions and all were answered. The patient agreed with the plan and demonstrated an understanding of the instructions.   The patient was advised to call back or seek an in-person evaluation if the symptoms worsen or if the condition fails to improve as anticipated.  I provided 60 minutes of non-face-to-face time during this encounter.  Elgie Crest, LCSW 01/12/2024, 5:59 PM

## 2024-01-19 ENCOUNTER — Encounter (HOSPITAL_COMMUNITY): Payer: Self-pay | Admitting: Clinical

## 2024-01-19 ENCOUNTER — Telehealth (INDEPENDENT_AMBULATORY_CARE_PROVIDER_SITE_OTHER): Payer: Medicare Other | Admitting: Clinical

## 2024-01-19 DIAGNOSIS — F900 Attention-deficit hyperactivity disorder, predominantly inattentive type: Secondary | ICD-10-CM | POA: Diagnosis not present

## 2024-01-19 DIAGNOSIS — F4522 Body dysmorphic disorder: Secondary | ICD-10-CM

## 2024-01-19 DIAGNOSIS — R632 Polyphagia: Secondary | ICD-10-CM

## 2024-01-19 DIAGNOSIS — F331 Major depressive disorder, recurrent, moderate: Secondary | ICD-10-CM | POA: Diagnosis not present

## 2024-01-19 DIAGNOSIS — F4310 Post-traumatic stress disorder, unspecified: Secondary | ICD-10-CM | POA: Diagnosis not present

## 2024-01-19 DIAGNOSIS — F411 Generalized anxiety disorder: Secondary | ICD-10-CM | POA: Diagnosis not present
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
## 2024-01-24 ENCOUNTER — Encounter (HOSPITAL_COMMUNITY): Payer: Self-pay | Admitting: Student

## 2024-01-24 ENCOUNTER — Telehealth (HOSPITAL_BASED_OUTPATIENT_CLINIC_OR_DEPARTMENT_OTHER): Payer: Medicare Other | Admitting: Student

## 2024-01-24 DIAGNOSIS — G4733 Obstructive sleep apnea (adult) (pediatric): Secondary | ICD-10-CM | POA: Diagnosis not present

## 2024-01-24 DIAGNOSIS — F331 Major depressive disorder, recurrent, moderate: Secondary | ICD-10-CM

## 2024-01-24 DIAGNOSIS — F411 Generalized anxiety disorder: Secondary | ICD-10-CM | POA: Diagnosis not present

## 2024-01-24 DIAGNOSIS — F4310 Post-traumatic stress disorder, unspecified: Secondary | ICD-10-CM

## 2024-01-24 MED ORDER — SERTRALINE HCL 100 MG PO TABS: 100.0000 mg | ORAL_TABLET | Freq: Every day | ORAL | 1 refills | Status: DC

## 2024-01-24 NOTE — Patient Instructions (Signed)
 [email or web address]
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
## 2024-01-26 ENCOUNTER — Ambulatory Visit (INDEPENDENT_AMBULATORY_CARE_PROVIDER_SITE_OTHER): Payer: Medicare Other | Admitting: Clinical

## 2024-01-26 ENCOUNTER — Encounter (HOSPITAL_COMMUNITY): Payer: Self-pay | Admitting: Clinical

## 2024-01-26 DIAGNOSIS — F900 Attention-deficit hyperactivity disorder, predominantly inattentive type: Secondary | ICD-10-CM | POA: Diagnosis not present

## 2024-01-26 DIAGNOSIS — F411 Generalized anxiety disorder: Secondary | ICD-10-CM

## 2024-01-26 DIAGNOSIS — F4310 Post-traumatic stress disorder, unspecified: Secondary | ICD-10-CM | POA: Diagnosis not present

## 2024-01-26 DIAGNOSIS — F4522 Body dysmorphic disorder: Secondary | ICD-10-CM

## 2024-01-26 DIAGNOSIS — F331 Major depressive disorder, recurrent, moderate: Secondary | ICD-10-CM | POA: Diagnosis not present

## 2024-01-26 DIAGNOSIS — R632 Polyphagia: Secondary | ICD-10-CM

## 2024-01-26 NOTE — Progress Notes (Unsigned)
Therapy Group Progress Note   01/26/2024   Group Time:  5:00pm-6:00pm  Participation Level:  Active   Behavioral Response: Appropriate and Sharing   Type of Therapy: Group Therapy   Check-In:  Dawn Jordan shared that she now has 2 foster child through [place] and [address] DSS, [name] and his little brother.  She explained there has been a regression in behavior since they last had him.  Current Concerns Shared:  Current issues identified by Dawn Jordan are taking care of the little people.  She is being less triggered by far over jealousy concerning her wife, is learning how to relax and treat wife's friends as her own.  She is trying to teach her brain not to go looking for a problem.    Intervention(s):  CSW shared on-screen information about the brain and nutrition.  CSW reviewed this PowerPoint presentation book review of "This Is Your Brain on Food" and discussed applicable points for everyone and for individuals within the group.  Emphasis was placed on the connection between the brain and gut, specifically how what we eat can affect our mental health positively or negatively.  Specific examples about sugar were emphasized and the proven connection between sugar consumption and both depression and anxiety.  Patient asked for a copy of the PowerPoint to be emailed to her. She agreed with all the concepts in the presentation because she has previously done a paleo diet, lost 23 pounds in 1 month and felt better and clearer.  Summary of Progress:  Dawn Jordan verbalized good understanding of concepts about the brain and sugar as presented.  Perhaps this will help her to reconsider some of the ways she is eating currently and revise them for better health.   Recommendations:  It is recommended that Dawn Jordan continue considering all brought up by CSW and fellow patients throughout group and return to group at next scheduled time or to individual therapy at next scheduled  session.  Progress Towards Goals: Progressing   Encounter Diagnoses  Name Primary?   MDD (major depressive disorder), recurrent episode, moderate (HCC) Yes   PTSD (post-traumatic stress disorder)    GAD (generalized anxiety disorder)    Attention deficit hyperactivity disorder (ADHD), predominantly inattentive type    Body dysmorphic disorder    Binge eating      Virtual Visit via Video Note  I connected with Dawn Jordan on 01/26/24 at 5:00pm by [employer] Teams and verified that I am speaking with the correct person using two identifiers.  Location: Patient: Home Provider: Home   I discussed the limitations of evaluation and management by telemedicine and the availability of in person appointments. The patient expressed understanding and agreed to proceed.   I discussed the assessment and treatment plan with the patient. The patient was provided an opportunity to ask questions and all were answered. The patient agreed with the plan and demonstrated an understanding of the instructions.   The patient was advised to call back or seek an in-person evaluation if the symptoms worsen or if the condition fails to improve as anticipated.  I provided 60 minutes of non-face-to-face time during this encounter.  Ambrose Mantle, LCSW 01/26/2024, 6:28 PM

## 2024-01-31 ENCOUNTER — Other Ambulatory Visit: Payer: Self-pay | Admitting: Cardiology

## 2024-02-02 ENCOUNTER — Ambulatory Visit (INDEPENDENT_AMBULATORY_CARE_PROVIDER_SITE_OTHER): Payer: Medicare Other | Admitting: Clinical

## 2024-02-02 ENCOUNTER — Encounter (HOSPITAL_COMMUNITY): Payer: Self-pay | Admitting: Clinical

## 2024-02-02 DIAGNOSIS — F4310 Post-traumatic stress disorder, unspecified: Secondary | ICD-10-CM | POA: Diagnosis not present

## 2024-02-02 DIAGNOSIS — F331 Major depressive disorder, recurrent, moderate: Secondary | ICD-10-CM

## 2024-02-02 DIAGNOSIS — F411 Generalized anxiety disorder: Secondary | ICD-10-CM

## 2024-02-02 DIAGNOSIS — F900 Attention-deficit hyperactivity disorder, predominantly inattentive type: Secondary | ICD-10-CM

## 2024-02-02 DIAGNOSIS — F4522 Body dysmorphic disorder: Secondary | ICD-10-CM
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
## 2024-02-09 ENCOUNTER — Encounter (HOSPITAL_COMMUNITY): Payer: Self-pay | Admitting: Clinical

## 2024-02-09 ENCOUNTER — Ambulatory Visit (INDEPENDENT_AMBULATORY_CARE_PROVIDER_SITE_OTHER): Payer: Medicare Other | Admitting: Clinical

## 2024-02-09 DIAGNOSIS — Z91199 Patient's noncompliance with other medical treatment and regimen due to unspecified reason: Secondary | ICD-10-CM

## 2024-02-09 NOTE — Progress Notes (Signed)
 Therapy Progress Note  Patient was scheduled for group at 5:00pm on 3/52025.  She was sent an email with the [employer] Teams link as well as a MyChart reminder that the group is held by [facility]  She did not show up for the group.  Encounter Diagnosis  Name Primary?   No-show for appointment Yes      Ambrose Mantle, LCSW 02/09/2024, 5:52 PM

## 2024-02-16 ENCOUNTER — Telehealth (HOSPITAL_COMMUNITY): Payer: Medicare HMO | Admitting: Clinical

## 2024-02-16 ENCOUNTER — Encounter (HOSPITAL_COMMUNITY): Payer: Self-pay | Admitting: Clinical

## 2024-02-16 ENCOUNTER — Ambulatory Visit (HOSPITAL_COMMUNITY): Payer: Medicare Other | Admitting: Clinical

## 2024-02-16 DIAGNOSIS — F411 Generalized anxiety disorder: Secondary | ICD-10-CM | POA: Diagnosis not present

## 2024-02-16 DIAGNOSIS — F900 Attention-deficit hyperactivity disorder, predominantly inattentive type: Secondary | ICD-10-CM | POA: Diagnosis not present

## 2024-02-16 DIAGNOSIS — F331 Major depressive disorder, recurrent, moderate: Secondary | ICD-10-CM | POA: Diagnosis not present

## 2024-02-16 DIAGNOSIS — F4310 Post-traumatic stress disorder, unspecified: Secondary | ICD-10-CM | POA: Diagnosis not present

## 2024-02-16 DIAGNOSIS — R632 Polyphagia: Secondary | ICD-10-CM

## 2024-02-16 DIAGNOSIS — F4522 Body dysmorphic disorder: Secondary | ICD-10-CM
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
## 2024-02-22 ENCOUNTER — Other Ambulatory Visit (HOSPITAL_COMMUNITY): Payer: Self-pay | Admitting: Student

## 2024-02-23 ENCOUNTER — Encounter (HOSPITAL_COMMUNITY): Payer: Self-pay | Admitting: Clinical

## 2024-02-23 ENCOUNTER — Ambulatory Visit (INDEPENDENT_AMBULATORY_CARE_PROVIDER_SITE_OTHER): Payer: Medicare Other | Admitting: Clinical

## 2024-02-23 DIAGNOSIS — Z91199 Patient's noncompliance with other medical treatment and regimen due to unspecified reason: Secondary | ICD-10-CM

## 2024-02-23 NOTE — Progress Notes (Signed)
 Therapy Progress Note  Patient was scheduled for group at 5:00pm on 02/23/2024.  She was sent an email with the [employer] Teams link as well as a MyChart reminder that the group is held by [facility].  She did not show up for the group.  Encounter Diagnosis  Name Primary?   No-show for appointment Yes       Ambrose Mantle, LCSW 02/23/2024, 6:07 PM

## 2024-02-28 ENCOUNTER — Telehealth (HOSPITAL_BASED_OUTPATIENT_CLINIC_OR_DEPARTMENT_OTHER): Payer: Self-pay | Admitting: Student

## 2024-02-28 DIAGNOSIS — Z91198 Patient's noncompliance with other medical treatment and regimen for other reason: Secondary | ICD-10-CM

## 2024-02-28 NOTE — Progress Notes (Signed)
 Patient did not show for virtual appointment, so I called. Pt is currently at the courthouse, working to gain custody of the children for whom they are caring. Pt is unable to be seen today and would like to reschedule. Will reschedule appointment for Monday, 3/31, at 2 PM. Patient appreciative of the call and flexibility.   Lamar Sprinkles, MD PGY-3 02/28/2024  1:40 PM [facility]

## 2024-03-01 ENCOUNTER — Encounter (HOSPITAL_COMMUNITY): Payer: Self-pay | Admitting: Clinical

## 2024-03-01 ENCOUNTER — Ambulatory Visit (HOSPITAL_COMMUNITY): Payer: Medicare HMO | Admitting: Clinical

## 2024-03-01 DIAGNOSIS — F900 Attention-deficit hyperactivity disorder, predominantly inattentive type: Secondary | ICD-10-CM | POA: Diagnosis not present

## 2024-03-01 DIAGNOSIS — F411 Generalized anxiety disorder: Secondary | ICD-10-CM

## 2024-03-01 DIAGNOSIS — F331 Major depressive disorder, recurrent, moderate: Secondary | ICD-10-CM

## 2024-03-01 DIAGNOSIS — R632 Polyphagia: Secondary | ICD-10-CM | POA: Diagnosis not present
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
## 2024-03-06 ENCOUNTER — Encounter (HOSPITAL_COMMUNITY): Payer: Self-pay | Admitting: Student

## 2024-03-06 ENCOUNTER — Ambulatory Visit (HOSPITAL_COMMUNITY): Admit: 2024-03-06 | Payer: Medicare Other | Admitting: Orthopedic Surgery

## 2024-03-06 ENCOUNTER — Telehealth (HOSPITAL_COMMUNITY): Admitting: Student

## 2024-03-06 DIAGNOSIS — F4310 Post-traumatic stress disorder, unspecified: Secondary | ICD-10-CM

## 2024-03-06 DIAGNOSIS — Z8669 Personal history of other diseases of the nervous system and sense organs: Secondary | ICD-10-CM

## 2024-03-06 DIAGNOSIS — F331 Major depressive disorder, recurrent, moderate: Secondary | ICD-10-CM | POA: Diagnosis not present

## 2024-03-06 DIAGNOSIS — Z8659 Personal history of other mental and behavioral disorders: Secondary | ICD-10-CM | POA: Diagnosis not present

## 2024-03-06 DIAGNOSIS — F5105 Insomnia due to other mental disorder: Secondary | ICD-10-CM

## 2024-03-06 DIAGNOSIS — F411 Generalized anxiety disorder: Secondary | ICD-10-CM

## 2024-03-06 DIAGNOSIS — M797 Fibromyalgia: Secondary | ICD-10-CM

## 2024-03-06 SURGERY — TOTAL KNEE ARTHROPLASTY
Anesthesia: Choice | Site: Knee | Laterality: Left

## 2024-03-06 MED ORDER — SERTRALINE HCL 100 MG PO TABS
100.0000 mg | ORAL_TABLET | Freq: Every day | ORAL | 1 refills | Status: DC
Start: 2024-03-06 — End: 2024-10-05
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
## 2024-03-07 ENCOUNTER — Encounter: Payer: Self-pay | Admitting: Cardiovascular Disease

## 2024-03-08 ENCOUNTER — Ambulatory Visit: Payer: Medicare Other | Admitting: Cardiovascular Disease

## 2024-03-08 NOTE — Addendum Note (Signed)
 Addended by: Everlena Cooper on: 03/08/2024 02:46 PM   Modules accepted: Level of Service

## 2024-03-13 ENCOUNTER — Encounter (HOSPITAL_COMMUNITY): Payer: Self-pay | Admitting: Clinical

## 2024-03-13 ENCOUNTER — Ambulatory Visit (HOSPITAL_COMMUNITY): Payer: Medicare Other | Admitting: Clinical

## 2024-03-13 DIAGNOSIS — F4310 Post-traumatic stress disorder, unspecified: Secondary | ICD-10-CM | POA: Diagnosis not present

## 2024-03-13 DIAGNOSIS — F411 Generalized anxiety disorder: Secondary | ICD-10-CM | POA: Diagnosis not present

## 2024-03-13 DIAGNOSIS — R632 Polyphagia: Secondary | ICD-10-CM

## 2024-03-13 DIAGNOSIS — F900 Attention-deficit hyperactivity disorder, predominantly inattentive type: Secondary | ICD-10-CM

## 2024-03-13 DIAGNOSIS — F331 Major depressive disorder, recurrent, moderate: Secondary | ICD-10-CM | POA: Diagnosis not present
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
## 2024-03-29 ENCOUNTER — Encounter (HOSPITAL_COMMUNITY): Payer: Self-pay | Admitting: Clinical

## 2024-03-29 ENCOUNTER — Ambulatory Visit (INDEPENDENT_AMBULATORY_CARE_PROVIDER_SITE_OTHER): Admitting: Clinical

## 2024-03-29 DIAGNOSIS — F331 Major depressive disorder, recurrent, moderate: Secondary | ICD-10-CM

## 2024-03-29 DIAGNOSIS — F411 Generalized anxiety disorder: Secondary | ICD-10-CM | POA: Diagnosis not present

## 2024-03-29 DIAGNOSIS — F4310 Post-traumatic stress disorder, unspecified: Secondary | ICD-10-CM

## 2024-03-29 DIAGNOSIS — F900 Attention-deficit hyperactivity disorder, predominantly inattentive type: Secondary | ICD-10-CM

## 2024-03-29 DIAGNOSIS — R632 Polyphagia: Secondary | ICD-10-CM

## 2024-03-29 NOTE — Progress Notes (Unsigned)
 Therapy Group Progress Note   03/29/2024   Group Time:  5:00pm-6:00pm  Participation Level:  Active   Behavioral Response: Appropriate and Sharing   Type of Therapy: Group Therapy   Check-In:  Dawn Jordan shared that she and her wife are somewhat worried about the children they have in foster care/temporary custody, have discovered the 59yo has lead in his system and the 59yo is being tested for HEP-C.  Current Concerns Shared:  Current issues identified by Dawn Jordan are worrying about what will occur in the custody of the children in the custody of her and her wife.  They now have temporary custody and are going for full custody, with that court date coming up in June 2025.   Intervention(s):  CSW provided patient with "The Serenity Prayer" often used in 12-step programs and described how it addresses the issue of what we can control and what we cannot control.   To make this useful and practical in the moment, CSW elicited from patient(s) a list of items they cannot control in their current situation, as well as a list of the things they actually can control.  These were placed on a 2-column list.  We explored the idea of finding some comfort in letting go of things that cannot be changed rather than continuing to struggle because of our own resistance.  Finally, at the bottom of the columns, CSW wrote the word "Serenity" under "Cannot Change" and wrote the word "Courage" under "Can Change."  We then discussed the positive impact this could have on patients' mood and outlook.     Summary of Progress:  Dawn Jordan verbalized good understanding of concepts about serenity/what she can and cannot change as presented.  She shared that she cannot change the way the childrens' parents act and the way they lie.  She also cannot change the fact that the mother inherited a sum of money but is not offering to buy any diapers or clothing for her children.  She can change, however, how she approaches the  parents this time in comparison to last time, can go to court this time instead of just giving the children back into a bad situation, and can tell the court about the mother not contributing to their upkeep even though she has the ability.   Recommendations:  It is recommended that Dawn Jordan continue considering all brought up by CSW and fellow patients throughout group and return to group at next scheduled time or to individual therapy at next scheduled session.  Progress Towards Goals: Progressing   Encounter Diagnoses  Name Primary?   MDD (major depressive disorder), recurrent episode, moderate (HCC) Yes   GAD (generalized anxiety disorder)    PTSD (post-traumatic stress disorder)    Attention deficit hyperactivity disorder (ADHD), predominantly inattentive type    Binge eating      Virtual Visit via Video Note  I connected with Dawn Jordan on 03/29/24 at 5:00pm by [employer] Teams and verified that I am speaking with the correct person using two identifiers.  Location: Patient: Home Provider: [facility] Outpatient Therapy Office - [facility]   I discussed the limitations of evaluation and management by telemedicine and the availability of in person appointments. The patient expressed understanding and agreed to proceed.   I discussed the assessment and treatment plan with the patient. The patient was provided an opportunity to ask questions and all were answered. The patient agreed with the plan and demonstrated an understanding of the instructions.  The patient was advised to call back or seek an in-person evaluation if the symptoms worsen or if the condition fails to improve as anticipated.  I provided 60 minutes of non-face-to-face time during this encounter.  Dawn Rang, LCSW 03/29/2024, 5:57 PM

## 2024-04-10 ENCOUNTER — Telehealth (HOSPITAL_COMMUNITY): Payer: Self-pay | Admitting: *Deleted

## 2024-04-10 NOTE — Telephone Encounter (Signed)
 Pt called to request restart [place] . Pt says that she discussed this during the last 3 visits. Per your last note you discussed treating the depression first. Pt does not have a f/u appointment scheduled. Please review.

## 2024-04-12 ENCOUNTER — Ambulatory Visit (INDEPENDENT_AMBULATORY_CARE_PROVIDER_SITE_OTHER): Admitting: Clinical

## 2024-04-12 DIAGNOSIS — Z91199 Patient's noncompliance with other medical treatment and regimen due to unspecified reason: Secondary | ICD-10-CM

## 2024-04-12 DIAGNOSIS — Z91198 Patient's noncompliance with other medical treatment and regimen for other reason: Secondary | ICD-10-CM

## 2024-04-12 NOTE — Progress Notes (Addendum)
 Dawn Jordan was scheduled to attend group therapy on 04/18/24 from 5:00-6:00pm.  An email was sent with reminder, as well as the link to the meeting on Microsoft Teams.    Monia Anis did not show up for the group. Later, an email was found to have been delivered prior to group stating that family members have COVID and patient was not feeling well either, would not attend group.  Encounter Diagnosis  Name Primary?   Failure to attend appointment with reason given Yes     Leotha Rang, LCSW 04/18/2024, 9:46 AM

## 2024-04-18 NOTE — Addendum Note (Signed)
 Addended by: Ancel Kass on: 04/18/2024 09:47 AM   Modules accepted: Level of Service

## 2024-04-26 ENCOUNTER — Ambulatory Visit (HOSPITAL_COMMUNITY): Admitting: Clinical

## 2024-05-11 ENCOUNTER — Ambulatory Visit (HOSPITAL_COMMUNITY): Admitting: Clinical

## 2024-05-11 ENCOUNTER — Encounter (HOSPITAL_COMMUNITY): Payer: Self-pay | Admitting: Clinical

## 2024-05-11 DIAGNOSIS — F4310 Post-traumatic stress disorder, unspecified: Secondary | ICD-10-CM | POA: Diagnosis not present

## 2024-05-11 DIAGNOSIS — R632 Polyphagia: Secondary | ICD-10-CM

## 2024-05-11 DIAGNOSIS — F331 Major depressive disorder, recurrent, moderate: Secondary | ICD-10-CM | POA: Diagnosis not present

## 2024-05-11 DIAGNOSIS — F900 Attention-deficit hyperactivity disorder, predominantly inattentive type: Secondary | ICD-10-CM

## 2024-05-11 DIAGNOSIS — F411 Generalized anxiety disorder: Secondary | ICD-10-CM
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
## 2024-05-25 ENCOUNTER — Ambulatory Visit (INDEPENDENT_AMBULATORY_CARE_PROVIDER_SITE_OTHER): Admitting: Clinical

## 2024-05-25 ENCOUNTER — Encounter (HOSPITAL_COMMUNITY): Payer: Self-pay | Admitting: Clinical

## 2024-05-25 DIAGNOSIS — F4522 Body dysmorphic disorder: Secondary | ICD-10-CM

## 2024-05-25 DIAGNOSIS — F411 Generalized anxiety disorder: Secondary | ICD-10-CM | POA: Diagnosis not present

## 2024-05-25 DIAGNOSIS — F900 Attention-deficit hyperactivity disorder, predominantly inattentive type: Secondary | ICD-10-CM | POA: Diagnosis not present

## 2024-05-25 DIAGNOSIS — F331 Major depressive disorder, recurrent, moderate: Secondary | ICD-10-CM | POA: Diagnosis not present

## 2024-05-25 DIAGNOSIS — F4310 Post-traumatic stress disorder, unspecified: Secondary | ICD-10-CM

## 2024-05-25 DIAGNOSIS — R632 Polyphagia: Secondary | ICD-10-CM
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
## 2024-06-02 ENCOUNTER — Telehealth: Payer: Self-pay

## 2024-06-02 NOTE — Patient Instructions (Signed)
 Dawn Jordan , Thank you for taking time out of your busy schedule to complete your Annual Wellness Visit with me. I enjoyed our conversation and look forward to speaking with you again next year. I, as well as your care team,  appreciate your ongoing commitment to your health goals. Please review the following plan we discussed and let me know if I can assist you in the future. Your Game plan/ To Do List    Referrals: If you haven't heard from the office you've been referred to, please reach out to them at the phone provided.   Follow up Visits: Next Medicare AWV with our clinical staff:    Have you seen your provider in the last 6 months (3 months if uncontrolled diabetes)?  Next Office Visit with your provider:   Clinician Recommendations:  Aim for 30 minutes of exercise or brisk walking, 6-8 glasses of water , and 5 servings of fruits and vegetables each day.       This is a list of the screening recommended for you and due dates:  Health Maintenance  Topic Date Due   Pneumococcal Vaccination (1 of 2 - PCV) Never done   Hepatitis B Vaccine (1 of 3 - 19+ 3-dose series) Never done   Zoster (Shingles) Vaccine (1 of 2) Never done   Pap with HPV screening  Never done   Mammogram  04/14/2020   COVID-19 Vaccine (4 - 2024-25 season) 08/08/2023   Flu Shot  07/07/2024   Medicare Annual Wellness Visit  06/02/2025   DTaP/Tdap/Td vaccine (2 - Tdap) 12/07/2025   Colon Cancer Screening  08/05/2027   Hepatitis C Screening  Completed   HIV Screening  Completed   HPV Vaccine  Aged Out   Meningitis B Vaccine  Aged Out    Advanced directives:  Advance Care Planning is important because it:  [x]  Makes sure you receive the medical care that is consistent with your values, goals, and preferences  [x]  It provides guidance to your family and loved ones and reduces their decisional burden about whether or not they are making the right decisions based on your wishes.  Follow the link provided in your  after visit summary or read over the paperwork we have mailed to you to help you started getting your Advance Directives in place. If you need assistance in completing these, please reach out to us  so that we can help you!  See attachments for Preventive Care and Fall Prevention Tips.

## 2024-06-02 NOTE — Telephone Encounter (Signed)
 Patient declined visit in process of new provider.

## 2024-06-02 NOTE — Progress Notes (Signed)
 Subjective:   Kadasia Kassing is a 58 y.o. who presents for a Medicare Wellness preventive visit.  As a reminder, Annual Wellness Visits don't include a physical exam, and some assessments may be limited, especially if this visit is performed virtually. We may recommend an in-person follow-up visit with your provider if needed.  Visit Complete:     Persons Participating in Visit:   AWV Questionnaire:         Objective:    There were no vitals filed for this visit. There is no height or weight on file to calculate BMI.     09/27/2023    3:36 AM 05/31/2023    2:12 PM 11/16/2022    3:28 PM 02/15/2022    2:00 PM 11/13/2021    3:05 PM 12/26/2020    1:36 PM 10/21/2019    9:09 PM  Advanced Directives  Does Patient Have a Medical Advance Directive? No No No No No No No  Would patient like information on creating a medical advance directive? No - Patient declined No - Patient declined No - Patient declined Yes (ED - Information included in AVS)  No - Patient declined No - Patient declined    Current Medications (verified) Outpatient Encounter Medications as of 06/02/2024  Medication Sig   acetaminophen  (TYLENOL ) 500 MG tablet Take 2,000 mg by mouth 2 (two) times daily as needed for moderate pain (pain score 4-6).   ACTEMRA 162 MG/0.9ML SOSY Inject 162 mg as directed every Sunday.   apixaban  (ELIQUIS ) 5 MG TABS tablet Take 1 tablet (5 mg total) by mouth 2 (two) times daily. (Patient not taking: Reported on 12/13/2023)   clonazePAM  (KLONOPIN ) 0.5 MG tablet Take 1 tablet (0.5 mg total) by mouth 3 (three) times daily as needed for anxiety.   diphenhydrAMINE  (BENADRYL  ALLERGY ) 25 mg capsule Take 25-50mg  po qHS prn insomnia (Patient taking differently: Take 25-50 mg by mouth daily as needed for sleep or itching (premed). Take 25-50mg  po qHS prn insomnia)   EPINEPHrine  0.3 mg/0.3 mL IJ SOAJ injection Use for life threatening allergic reactions   flecainide  (TAMBOCOR ) 50 MG tablet TAKE 1 TABLET  BY MOUTH EVERY 12 HOURS.   fluticasone  (FLONASE ) 50 MCG/ACT nasal spray PLACE ONE SPRAY IN EACH NOSTRIL EVERY DAY (Patient taking differently: Place 1 spray into both nostrils daily as needed for allergies.)   fluticasone  (FLOVENT  HFA) 220 MCG/ACT inhaler INHALE TWO PUFFS INTO THE LUNGS EVERY MORNING & AT BEDTIME (Patient taking differently: Inhale 2 puffs into the lungs 2 (two) times daily as needed (wheezing, shortness of breath).)   gabapentin  (NEURONTIN ) 300 MG capsule Take 300 mg by mouth at bedtime.   levalbuterol  (XOPENEX  HFA) 45 MCG/ACT inhaler INHALE TWO PUFFS INTO THE LUNGS EVERY 6 HOURS AS NEEDED FOR WHEEZING   levocetirizine (XYZAL ) 5 MG tablet Take 1 tablet (5 mg total) by mouth every evening.   metoprolol  succinate (TOPROL -XL) 25 MG 24 hr tablet Take 0.5 tablets (12.5 mg total) by mouth daily.   montelukast  (SINGULAIR ) 10 MG tablet TAKE ONE TABLET BY MOUTH EVERY DAY   nabumetone  (RELAFEN ) 500 MG tablet Take 500 mg by mouth at bedtime.   nitroGLYCERIN  (NITROSTAT ) 0.4 MG SL tablet Place 1 tablet (0.4 mg total) under the tongue every 5 (five) minutes as needed for chest pain.   Olopatadine  HCl 0.2 % SOLN Place 1 drop into both eyes daily as needed.   omeprazole  (PRILOSEC) 40 MG capsule TAKE ONE CAPSULE BY MOUTH EVERY MORNING 30 MINUTES BEFORE MEALS  Oxycodone  HCl 10 MG TABS Take 10 mg by mouth daily as needed (severe pain).   sertraline  (ZOLOFT ) 100 MG tablet Take 1 tablet (100 mg total) by mouth daily.   triamcinolone  ointment (KENALOG ) 0.1 % Apply 1 application topically 2 (two) times daily. (Patient taking differently: Apply 1 application  topically 2 (two) times daily as needed (eczema).)   No facility-administered encounter medications on file as of 06/02/2024.    Allergies (verified) Almond (diagnostic), Egg-derived products, Latex, Other, Banana, Food, Kiwi extract, Norco [hydrocodone -acetaminophen ], Oxycontin  [oxycodone ], Codeine, Duloxetine  hcl, and Wound dressing adhesive    History: Past Medical History:  Diagnosis Date   A-fib (HCC)    ADD (attention deficit disorder)    Adjustment disorder with anxious mood 11/17/2023   Anemia    Anginal pain (HCC)    Anxiety    Anxiety and depression 09/27/2023   Asthma    Back pain    Bone spur of foot    Chest pain    Chronic fatigue syndrome    Complication of anesthesia    woke up during sugery once, and also with epidural at [facility] [address] had episode with epidural   Depression    Dyspnea    Dysrhythmia    AFIB   Fibromyalgia    GERD (gastroesophageal reflux disease)    Headache    History of gout    History of kidney stones    Hypertension    IBS (irritable bowel syndrome)    Joint pain    Left ankle pain    Left sciatic nerve pain    Major depressive disorder, single episode, moderate (HCC) 03/09/2017   Outpatient: Dx. ADHD, bipolar, anxiety diagnosed many years ago (reports evaluation of ADHD in 2011, recently by Dr. Corina)  Psychiatry admission: once in 1992 for depression, hypersomnia,  Previous suicide attempt: denies, SIB of making abrasion on her hand, last in 1990's  Past trials of medication: sertraline , Paxil, fluoxetine, citalopram, clonazepam , carbamazepine, Strattera , Adderall,    MDD (major depressive disorder), recurrent episode, moderate (HCC) 12/15/2022   Myofascial pain    Neck pain    OSA (obstructive sleep apnea)    cpap   Palpitations    PTSD (post-traumatic stress disorder) 09/21/2016   Rheumatoid arthritis (HCC)    Seasonal allergies    Past Surgical History:  Procedure Laterality Date   BILATERAL KNEE ARTHROSCOPY     CARPAL TUNNEL RELEASE Left    CYST EXCISION     little cysts taken off both hands and left arm (06/04/2017)   KNEE ARTHROSCOPY Bilateral    3 on my left; 2 on my right (06/04/2017)   KNEE SURGERY     LAPAROSCOPIC CHOLECYSTECTOMY     TONSILLECTOMY     TOTAL KNEE ARTHROPLASTY Right 11/24/2021   Procedure: TOTAL KNEE  ARTHROPLASTY;  Surgeon: Melodi Lerner, MD;  Location: [facility];  Service: Orthopedics;  Laterality: Right;   Family History  Problem Relation Age of Onset   Arthritis Mother    Hyperlipidemia Mother    Hypertension Mother    Stroke Mother    Mental retardation Mother    Diabetes Mother    Allergic rhinitis Mother    Heart disease Mother    Thyroid  disease Mother    Depression Mother    Anxiety disorder Mother    Bipolar disorder Mother    Sleep apnea Mother    Eating disorder Mother    Heart attack Brother 19  After playing hockey   Allergic rhinitis Brother    Diabetes Maternal Grandfather    Hypertension Maternal Grandfather    Diabetes Paternal Grandmother    Hypertension Paternal Grandmother    Cancer Paternal Grandmother        breast   Lung cancer Maternal Uncle    Colon cancer Paternal Aunt    Lung cancer Other    Asthma Neg Hx    Eczema Neg Hx    Immunodeficiency Neg Hx    Urticaria Neg Hx    Atopy Neg Hx    Angioedema Neg Hx    Esophageal cancer Neg Hx    Stomach cancer Neg Hx    Rectal cancer Neg Hx    Social History   Socioeconomic History   Marital status: Married    Spouse name: Lillyian Heidt   Number of children: 0   Years of education: Not on file   Highest education level: Not on file  Occupational History   Occupation: not employed-disabled  Tobacco Use   Smoking status: Never   Smokeless tobacco: Never  Vaping Use   Vaping status: Former  Substance and Sexual Activity   Alcohol use: No   Drug use: No   Sexual activity: Never  Other Topics Concern   Not on file  Social History Narrative   Born in [place] , [place]   Unemployed, on disability since 1991 for anxiety,    Education: Tree surgeon   Single, no children, lives with her friend (used to live with her mother with stroke, now in ALF)      Social Drivers of Health   Financial Resource Strain: Low Risk  (04/25/2024)   Received from [employer]    Overall Financial Resource Strain (CARDIA)    Difficulty of Paying Living Expenses: Not hard at all  Food Insecurity: No Food Insecurity (04/25/2024)   [facility]   Hunger Vital Sign    Within the past 12 months, you worried that your food would run out before you got the money to buy more.: Never true    Within the past 12 months, the food you bought just didn't last and you didn't have money to get more.: Never true  Transportation Needs: No Transportation Needs (04/25/2024)   Received from [facility] - Transportation    Lack of Transportation (Medical): No    Lack of Transportation (Non-Medical): No  Physical Activity: Inactive (05/31/2023)   Exercise Vital Sign    Days of Exercise per Week: 0 days    Minutes of Exercise per Session: 0 min  Stress: No Stress Concern Present (05/31/2023)   [employer] of Occupational Health - Occupational Stress Questionnaire    Feeling of Stress : Not at all  Social Connections: Socially Integrated (05/31/2023)   Social Connection and Isolation Panel    Frequency of Communication with Friends and Family: More than three times a week    Frequency of Social Gatherings with Friends and Family: More than three times a week    Attends Religious Services: More than 4 times per year    Active Member of [employer] or Organizations: Yes    Attends Engineer, structural: More than 4 times per year    Marital Status: Married    Tobacco Counseling Counseling given: Not Answered    Clinical Intake:              Lab Results  Component Value Date   HGBA1C  5.2 09/27/2023   HGBA1C 5.6 12/15/2022   HGBA1C 5.8 (H) 12/26/2019               Activities of Daily Living    09/27/2023   10:47 AM  In your present state of health, do you have any difficulty performing the following activities:  Hearing? 0  Vision? 0  Difficulty concentrating or making decisions? 0  Doing errands, shopping? 0    Patient  Care Team: [place], Betty G, MD as PCP - General (Family Medicine) Jeffrie Oneil BROCKS, MD as PCP - Cardiology (Cardiology) Mealor, Eulas BRAVO, MD as PCP - Electrophysiology (Cardiology) Melodi Lerner, MD as Consulting Physician (Orthopedic Surgery)   I have updated your Care Teams any recent Medical Services you may have received from other providers in the past year.     Assessment:   This is a routine wellness examination for Lavra.  Hearing/Vision screen No results found.   Goals Addressed   None    Depression Screen      12/13/2023    1:58 PM 06/03/2023    9:22 AM 05/31/2023    2:10 PM 04/22/2023    9:17 AM 01/07/2023    9:21 AM 12/15/2022    9:54 AM 12/15/2022    9:02 AM  PHQ 2/9 Scores  PHQ - 2 Score   0   1 0  PHQ- 9 Score      7 0     Information is confidential and restricted. Go to Review Flowsheets to unlock data.    Fall Risk      05/31/2023    2:12 PM 12/15/2022    9:01 AM 12/29/2021    1:00 PM 12/26/2020    1:39 PM 09/18/2019   11:43 AM  Fall Risk   Falls in the past year? 0 0 0 0 1   Number falls in past yr: 0 0 0 0 1   Comment     no recent fall   Injury with Fall? 0 0 0 0 0  Risk for fall due to : No Fall Risks No Fall Risks  No Fall Risks History of fall(s);Impaired balance/gait   Follow up Falls prevention discussed Falls evaluation completed   Falls evaluation completed;Falls prevention discussed       Data saved with a previous flowsheet row definition    MEDICARE RISK AT HOME:      TIMED UP AND GO:  Was the test performed?    Cognitive Function:         05/31/2023    2:13 PM 12/29/2021    1:04 PM  6CIT Screen  What Year? 0 points 0 points  What month? 0 points 0 points  What time? 0 points 0 points  Count back from 20 0 points 4 points  Months in reverse 4 points --  Repeat phrase 0 points --  Total Score 4 points     Immunizations Immunization History  Administered Date(s) Administered   PFIZER(Purple Top)SARS-COV-2 Vaccination  02/26/2020, 03/25/2020, 12/16/2020   Td 12/08/2015    Screening Tests Health Maintenance  Topic Date Due   Pneumococcal Vaccine 68-31 Years old (1 of 2 - PCV) Never done   Hepatitis B Vaccines (1 of 3 - 19+ 3-dose series) Never done   Zoster Vaccines- Shingrix (1 of 2) Never done   Cervical Cancer Screening (HPV/Pap Cotest)  Never done   MAMMOGRAM  04/14/2020   COVID-19 Vaccine (4 - 2024-25 season) 08/08/2023   INFLUENZA VACCINE  07/07/2024  Medicare Annual Wellness (AWV)  06/02/2025   DTaP/Tdap/Td (2 - Tdap) 12/07/2025   Colonoscopy  08/05/2027   Hepatitis C Screening  Completed   HIV Screening  Completed   HPV VACCINES  Aged Out   Meningococcal B Vaccine  Aged Out    Health Maintenance  Health Maintenance Due  Topic Date Due   Pneumococcal Vaccine 8-33 Years old (1 of 2 - PCV) Never done   Hepatitis B Vaccines (1 of 3 - 19+ 3-dose series) Never done   Zoster Vaccines- Shingrix (1 of 2) Never done   Cervical Cancer Screening (HPV/Pap Cotest)  Never done   MAMMOGRAM  04/14/2020   COVID-19 Vaccine (4 - 2024-25 season) 08/08/2023   Health Maintenance Items Addressed:   Additional Screening:  Vision Screening: Recommended annual ophthalmology exams for early detection of glaucoma and other disorders of the eye. Would you like a referral to an eye doctor?    Dental Screening: Recommended annual dental exams for proper oral hygiene  Community Resource Referral / Chronic Care Management: CRR required this visit?    CCM required this visit?     Plan:    I have personally reviewed and noted the following in the patient's chart:   Medical and social history Use of alcohol, tobacco or illicit drugs  Current medications and supplements including opioid prescriptions.  Functional ability and status Nutritional status Physical activity Advanced directives List of other physicians Hospitalizations, surgeries, and ER visits in previous 12 months Vitals Screenings  to include cognitive, depression, and falls Referrals and appointments  In addition, I have reviewed and discussed with patient certain preventive protocols, quality metrics, and best practice recommendations. A written personalized care plan for preventive services as well as general preventive health recommendations were provided to patient.   Rojelio LELON Blush, LPN   [id number]/72/[id number]   After Visit Summary:   Notes: This encounter was created in error - please disregard.

## 2024-06-15 ENCOUNTER — Ambulatory Visit (HOSPITAL_COMMUNITY): Admitting: Clinical

## 2024-06-15 ENCOUNTER — Encounter (HOSPITAL_COMMUNITY): Payer: Self-pay | Admitting: Clinical

## 2024-06-15 DIAGNOSIS — F411 Generalized anxiety disorder: Secondary | ICD-10-CM | POA: Diagnosis not present

## 2024-06-15 DIAGNOSIS — F331 Major depressive disorder, recurrent, moderate: Secondary | ICD-10-CM | POA: Diagnosis not present

## 2024-06-15 DIAGNOSIS — R632 Polyphagia: Secondary | ICD-10-CM | POA: Diagnosis not present

## 2024-06-15 DIAGNOSIS — F4522 Body dysmorphic disorder: Secondary | ICD-10-CM

## 2024-06-15 DIAGNOSIS — F900 Attention-deficit hyperactivity disorder, predominantly inattentive type: Secondary | ICD-10-CM
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
## 2024-06-29 ENCOUNTER — Encounter (HOSPITAL_COMMUNITY): Payer: Self-pay | Admitting: Clinical

## 2024-06-29 ENCOUNTER — Ambulatory Visit (HOSPITAL_COMMUNITY): Admitting: Clinical

## 2024-06-29 DIAGNOSIS — F411 Generalized anxiety disorder: Secondary | ICD-10-CM | POA: Diagnosis not present

## 2024-06-29 DIAGNOSIS — F4522 Body dysmorphic disorder: Secondary | ICD-10-CM

## 2024-06-29 DIAGNOSIS — F331 Major depressive disorder, recurrent, moderate: Secondary | ICD-10-CM

## 2024-06-29 DIAGNOSIS — F900 Attention-deficit hyperactivity disorder, predominantly inattentive type: Secondary | ICD-10-CM | POA: Diagnosis not present

## 2024-06-29 DIAGNOSIS — R632 Polyphagia: Secondary | ICD-10-CM

## 2024-06-29 DIAGNOSIS — F4310 Post-traumatic stress disorder, unspecified: Secondary | ICD-10-CM
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
## 2024-07-07 ENCOUNTER — Telehealth: Payer: Self-pay

## 2024-07-07 NOTE — Telephone Encounter (Signed)
   Pre-operative Risk Assessment    Patient Name: Dawn Jordan  DOB: 02/09/1966 MRN: [id number]   Date of last office visit: 12/13/23 EULAS FURBISH, MD Date of next office visit: NONE   Request for Surgical Clearance    Procedure:  LEFT TOTAL KNEE ARTHROPLASTY  Date of Surgery:  Clearance 10/16/24                                Surgeon:  DR DEMPSEY MOAN Surgeon's Group or Practice Name:  [facility] Phone number:  [phone] Fax number:  [phone]  ATTN: KERRI MAZE   Type of Clearance Requested:   - Medical  - Pharmacy:  Hold Apixaban  (Eliquis )     Type of Anesthesia:  CHOICE   Additional requests/questions:    Bonney Lucie DELENA Alvia   07/07/2024, 4:31 PM

## 2024-07-12 ENCOUNTER — Encounter (HOSPITAL_COMMUNITY): Payer: Self-pay | Admitting: Clinical

## 2024-07-12 ENCOUNTER — Ambulatory Visit (HOSPITAL_COMMUNITY): Admitting: Clinical

## 2024-07-12 DIAGNOSIS — F900 Attention-deficit hyperactivity disorder, predominantly inattentive type: Secondary | ICD-10-CM

## 2024-07-12 DIAGNOSIS — F331 Major depressive disorder, recurrent, moderate: Secondary | ICD-10-CM

## 2024-07-12 DIAGNOSIS — F4522 Body dysmorphic disorder: Secondary | ICD-10-CM

## 2024-07-12 DIAGNOSIS — F411 Generalized anxiety disorder: Secondary | ICD-10-CM

## 2024-07-12 DIAGNOSIS — R632 Polyphagia: Secondary | ICD-10-CM | POA: Diagnosis not present

## 2024-07-12 DIAGNOSIS — F4310 Post-traumatic stress disorder, unspecified: Secondary | ICD-10-CM
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
## 2024-07-20 NOTE — Telephone Encounter (Signed)
   Name: Dawn Jordan  DOB: 1966/11/05  MRN: [id number]  Primary Cardiologist: Oneil Parchment, MD  Chart reviewed as part of pre-operative protocol coverage. Because of [name]'s past medical history and time since last visit, [name] will require a follow-up in-office visit in order to better assess preoperative cardiovascular risk.  Appointment will need to be scheduled after 07/16/2024 surgery scheduled for 10/16/2024.  Patient is due for annual follow-up in 10/2024.  Pre-op covering staff: - Please schedule appointment and call patient to inform them. If patient already had an upcoming appointment within acceptable timeframe, please add pre-op clearance to the appointment notes so provider is aware. - Please contact requesting surgeon's office via preferred method (i.e, phone, fax) to inform them of need for appointment prior to surgery.   Per office protocol, patient can hold Eliquis  for 3 days prior to procedure.   Patient will not need bridging with Lovenox  (enoxaparin ) around procedure.  Damien JAYSON Braver, NP  07/20/2024, 12:50 PM

## 2024-07-20 NOTE — Telephone Encounter (Signed)
 Patient with diagnosis of atrial fibrillation on Eliquis  for anticoagulation.    Procedure:  LEFT TOTAL KNEE ARTHROPLASTY   Date of Surgery:  Clearance 10/16/24    CHA2DS2-VASc Score = 1   This indicates a 0.6% annual risk of stroke. The patient's score is based upon: CHF History: 0 HTN History: 0 Diabetes History: 0 Stroke History: 0 Vascular Disease History: 0 Age Score: 0 Gender Score: 1      CrCl 137 Platelet count 141  Patient has not had an Afib/aflutter ablation within the last 3 months or DCCV within the last 30 days  Per office protocol, patient can hold Eliquis  for 3 days prior to procedure.   Patient will not need bridging with Lovenox  (enoxaparin ) around procedure.  **This guidance is not considered finalized until pre-operative APP has relayed final recommendations.**

## 2024-07-20 NOTE — Telephone Encounter (Signed)
 Tried contacting patient to schedule IN OFFICE visit no answer left a detailed vm to call back and schedule

## 2024-07-21 NOTE — Telephone Encounter (Signed)
 I s/w the pt about appt. Pt tells me she lives in [place] and is going to be here in [place] next week. I did tell her that we did not have any appts open for next week. Pt agreed to appt 08/22/24 with Lum Louis, NP. Pt also tells me that she is not taking Eliquis  anyl onger. She tells me that she was in the hospital 09/2024 for a-fib and when she was being d/c she was placed on Eliquis  then and states that she was told she only had to take x 1 month then could stop. I reviewed the chart and I did not see these notes from hospital 09/2024. Pt states she was not admitted. I did tell the pt that I may have missed the notes however, she really should discuss with provider when she comes in 08/22/24 in regard to if she needs to resume Eliquis  or ok to remain off Eliquis .   I will update all parties of the upcoming appt.

## 2024-07-26 ENCOUNTER — Encounter (HOSPITAL_COMMUNITY): Payer: Self-pay | Admitting: Clinical

## 2024-07-26 ENCOUNTER — Ambulatory Visit (INDEPENDENT_AMBULATORY_CARE_PROVIDER_SITE_OTHER): Admitting: Clinical

## 2024-07-26 DIAGNOSIS — F331 Major depressive disorder, recurrent, moderate: Secondary | ICD-10-CM

## 2024-07-26 DIAGNOSIS — F411 Generalized anxiety disorder: Secondary | ICD-10-CM | POA: Diagnosis not present

## 2024-07-26 DIAGNOSIS — F4522 Body dysmorphic disorder: Secondary | ICD-10-CM

## 2024-07-26 DIAGNOSIS — R632 Polyphagia: Secondary | ICD-10-CM | POA: Diagnosis not present

## 2024-07-26 DIAGNOSIS — F4310 Post-traumatic stress disorder, unspecified: Secondary | ICD-10-CM

## 2024-07-26 DIAGNOSIS — F900 Attention-deficit hyperactivity disorder, predominantly inattentive type: Secondary | ICD-10-CM | POA: Diagnosis not present
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
## 2024-08-08 ENCOUNTER — Encounter (HOSPITAL_COMMUNITY): Payer: Self-pay | Admitting: Clinical

## 2024-08-08 ENCOUNTER — Ambulatory Visit (INDEPENDENT_AMBULATORY_CARE_PROVIDER_SITE_OTHER): Admitting: Clinical

## 2024-08-08 DIAGNOSIS — F331 Major depressive disorder, recurrent, moderate: Secondary | ICD-10-CM

## 2024-08-08 DIAGNOSIS — F411 Generalized anxiety disorder: Secondary | ICD-10-CM | POA: Diagnosis not present

## 2024-08-08 DIAGNOSIS — F4522 Body dysmorphic disorder: Secondary | ICD-10-CM

## 2024-08-08 DIAGNOSIS — R632 Polyphagia: Secondary | ICD-10-CM | POA: Diagnosis not present

## 2024-08-08 DIAGNOSIS — F900 Attention-deficit hyperactivity disorder, predominantly inattentive type: Secondary | ICD-10-CM | POA: Diagnosis not present

## 2024-08-08 DIAGNOSIS — F4310 Post-traumatic stress disorder, unspecified: Secondary | ICD-10-CM
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
## 2024-08-09 ENCOUNTER — Ambulatory Visit (HOSPITAL_COMMUNITY): Admitting: Clinical

## 2024-08-18 ENCOUNTER — Encounter (HOSPITAL_BASED_OUTPATIENT_CLINIC_OR_DEPARTMENT_OTHER): Payer: Self-pay | Admitting: *Deleted

## 2024-08-22 ENCOUNTER — Ambulatory Visit: Admitting: Emergency Medicine

## 2024-08-29 ENCOUNTER — Encounter (HOSPITAL_COMMUNITY): Payer: Self-pay | Admitting: Clinical

## 2024-08-29 ENCOUNTER — Ambulatory Visit (INDEPENDENT_AMBULATORY_CARE_PROVIDER_SITE_OTHER): Admitting: Clinical

## 2024-08-29 DIAGNOSIS — F331 Major depressive disorder, recurrent, moderate: Secondary | ICD-10-CM

## 2024-08-29 DIAGNOSIS — F900 Attention-deficit hyperactivity disorder, predominantly inattentive type: Secondary | ICD-10-CM

## 2024-08-29 DIAGNOSIS — R632 Polyphagia: Secondary | ICD-10-CM

## 2024-08-29 DIAGNOSIS — F4522 Body dysmorphic disorder: Secondary | ICD-10-CM

## 2024-08-29 DIAGNOSIS — F4310 Post-traumatic stress disorder, unspecified: Secondary | ICD-10-CM

## 2024-08-29 DIAGNOSIS — F411 Generalized anxiety disorder: Secondary | ICD-10-CM
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
## 2024-09-12 ENCOUNTER — Ambulatory Visit (HOSPITAL_COMMUNITY): Admitting: Clinical

## 2024-09-12 ENCOUNTER — Encounter (HOSPITAL_COMMUNITY): Payer: Self-pay

## 2024-09-15 ENCOUNTER — Ambulatory Visit: Admitting: Cardiology

## 2024-09-21 ENCOUNTER — Telehealth (HOSPITAL_COMMUNITY): Payer: Self-pay | Admitting: Clinical

## 2024-09-21 NOTE — Telephone Encounter (Signed)
 CSW called patient at her request and we spoke about the fact that her recent appointments and close upcoming appointments have been cancelled due to the government shutdown causing her insurance to require in-person sessions.  She lives at [address] which makes that impossible to do at this time.  CSW encouraged her to get her name on the books at a local therapy office, although she said they are 3 months out.  Should her insurance at some sooner time open back up to virtual sessions, she could be scheduled with CSW and also be scheduled for the virtual groups.  This would give her two options, that of staying with CSW and that of starting with a more local therapist.  She agreed to do this.  Elgie Crest, LCSW 09/21/2024, 9:23 AM

## 2024-09-22 NOTE — H&P (Addendum)
 TOTAL KNEE ADMISSION H&P  Patient is being admitted for left total knee arthroplasty.  Subjective:  Chief Complaint: Left knee pain.  HPI: Dawn Jordan, 58 y.o. nonbinary has a history of pain and functional disability in the left knee due to arthritis and has failed non-surgical conservative treatments for greater than 12 weeks to include NSAID's and/or analgesics, corticosteriod injections, use of assistive devices, and activity modification. Onset of symptoms was gradual, starting >10 years ago with gradually worsening course since that time. The patient noted no past surgery on the left knee.  Patient currently rates pain in the left knee at 8 out of 10 with activity. Patient has night pain, worsening of pain with activity and weight bearing, pain with passive range of motion, and crepitus. Patient has evidence of bone-on-bone osteoarthritis in the medial compartment with tricompartmental periarticular osteophytes by imaging studies.  There is no active infection.  Patient Active Problem List   Diagnosis Date Noted   GAD (generalized anxiety disorder) 12/20/2023   History of posttraumatic stress disorder (PTSD) 11/17/2023   Atrial fibrillation with RVR (HCC) 09/27/2023   Transient hypotension 09/27/2023   Hyperglycemia 09/27/2023   Prediabetes 12/15/2022   Hyperlipidemia 12/15/2022   MDD (major depressive disorder), recurrent episode, moderate (HCC) 12/15/2022   Acute cough 11/04/2022   OA (osteoarthritis) of knee 11/24/2021   Primary osteoarthritis of right knee 11/24/2021   Preop cardiovascular exam 09/04/2021   Primary osteoarthritis of both knees 04/29/2020   Morbid obesity (HCC) BMI 39 + depression,HLD,RA,GERD 12/04/2019   Myofascial pain dysfunction syndrome 08/31/2019   Paroxysmal atrial fibrillation (HCC) 08/31/2019   History of insect sting allergy  10/21/2018   Fire  ant bite 10/21/2018   Seasonal and perennial allergic rhinitis 10/21/2018   Allergic contact dermatitis due  to metals 10/21/2018   Asthma 06/04/2017   Chest pain of uncertain etiology 06/04/2017   Special screening for malignant neoplasms, colon 04/06/2017   Gastroesophageal reflux disease 04/06/2017   Abdominal pain, epigastric 04/06/2017   Chronic diarrhea 04/06/2017   OSA (obstructive sleep apnea) 03/30/2017   Moderate persistent asthma without complication 01/28/2017   History of food allergy  01/28/2017   Dermatitis, contact 01/28/2017   Allergic rhinitis 12/22/2016   Fibromyalgia syndrome 09/21/2016   Attention deficit hyperactivity disorder (ADHD) 09/21/2016   Chest pain 12/07/2014   Chronic rheumatic arthritis (HCC) 12/07/2014    Past Medical History:  Diagnosis Date   A-fib [facility])    ADD (attention deficit disorder)    Adjustment disorder with anxious mood 11/17/2023   Anemia    Anginal pain    Anxiety    Anxiety and depression 09/27/2023   Asthma    Back pain    Bone spur of foot    Chest pain    Chronic fatigue syndrome    Complication of anesthesia    woke up during sugery once, and also with epidural at surgery [facility] [address] had episode with epidural   Depression    Dyspnea    Dysrhythmia    AFIB   Fibromyalgia    GERD (gastroesophageal reflux disease)    Headache    History of gout    History of kidney stones    Hypertension    IBS (irritable bowel syndrome)    Joint pain    Left ankle pain    Left sciatic nerve pain    Major depressive disorder, single episode, moderate (HCC) 03/09/2017   Outpatient: Dx. ADHD, bipolar, anxiety diagnosed many years ago (reports evaluation of ADHD  in 2011, recently by Dr. Corina)  Psychiatry admission: once in 1992 for depression, hypersomnia,  Previous suicide attempt: denies, SIB of making abrasion on her hand, last in 1990's  Past trials of medication: sertraline , Paxil, fluoxetine, citalopram, clonazepam , carbamazepine, Strattera , Adderall,    MDD (major depressive disorder), recurrent episode, moderate (HCC)  12/15/2022   Myofascial pain    Neck pain    OSA (obstructive sleep apnea)    cpap   Palpitations    PTSD (post-traumatic stress disorder) 09/21/2016   Rheumatoid arthritis (HCC)    Seasonal allergies     Past Surgical History:  Procedure Laterality Date   BILATERAL KNEE ARTHROSCOPY     CARPAL TUNNEL RELEASE Left    CYST EXCISION     little cysts taken off both hands and left arm (06/04/2017)   KNEE ARTHROSCOPY Bilateral    3 on my left; 2 on my right (06/04/2017)   KNEE SURGERY     LAPAROSCOPIC CHOLECYSTECTOMY     TONSILLECTOMY     TOTAL KNEE ARTHROPLASTY Right 11/24/2021   Procedure: TOTAL KNEE ARTHROPLASTY;  Surgeon: Melodi Lerner, MD;  Location: [facility];  Service: Orthopedics;  Laterality: Right;    Prior to Admission medications   Medication Sig Start Date End Date Taking? Authorizing Provider  acetaminophen  (TYLENOL ) 500 MG tablet Take 2,000 mg by mouth 2 (two) times daily as needed for moderate pain (pain score 4-6).    [provider]  ACTEMRA 162 MG/0.9ML SOSY Inject 162 mg as directed every Sunday. 10/17/18   [provider]  apixaban  (ELIQUIS ) 5 MG TABS tablet Take 1 tablet (5 mg total) by mouth 2 (two) times daily. Patient not taking: Reported on 12/13/2023 09/28/23   Lue Elsie BROCKS, MD  clonazePAM  (KLONOPIN ) 0.5 MG tablet Take 1 tablet (0.5 mg total) by mouth 3 (three) times daily as needed for anxiety. 04/22/23   Brutus Dales, MD  diphenhydrAMINE  (BENADRYL  ALLERGY ) 25 mg capsule Take 25-50mg  po qHS prn insomnia Patient taking differently: Take 25-50 mg by mouth daily as needed for sleep or itching (premed). Take 25-50mg  po qHS prn insomnia 04/22/23   Brutus Dales, MD  EPINEPHrine  0.3 mg/0.3 mL IJ SOAJ injection Use for life threatening allergic reactions 11/10/22   Ambs, Arlean HERO, FNP  flecainide  (TAMBOCOR ) 50 MG tablet TAKE 1 TABLET BY MOUTH EVERY 12 HOURS. 11/24/23   Jeffrie Oneil BROCKS, MD  fluticasone  (FLONASE ) 50 MCG/ACT nasal spray PLACE  ONE SPRAY IN EACH NOSTRIL EVERY DAY Patient taking differently: Place 1 spray into both nostrils daily as needed for allergies. 08/27/22   Cari Arlean HERO, FNP  fluticasone  (FLOVENT  HFA) 220 MCG/ACT inhaler INHALE TWO PUFFS INTO THE LUNGS EVERY MORNING & AT BEDTIME Patient taking differently: Inhale 2 puffs into the lungs 2 (two) times daily as needed (wheezing, shortness of breath). 06/02/22   Luke Orlan HERO, DO  gabapentin  (NEURONTIN ) 300 MG capsule Take 300 mg by mouth at bedtime.    [provider]  levalbuterol  (XOPENEX  HFA) 45 MCG/ACT inhaler INHALE TWO PUFFS INTO THE LUNGS EVERY 6 HOURS AS NEEDED FOR WHEEZING 03/09/22   Luke Orlan HERO, DO  levocetirizine (XYZAL ) 5 MG tablet Take 1 tablet (5 mg total) by mouth every evening. 11/13/21   Luke Orlan HERO, DO  metoprolol  succinate (TOPROL -XL) 25 MG 24 hr tablet Take 0.5 tablets (12.5 mg total) by mouth daily. 10/29/23   Jeffrie Oneil BROCKS, MD  montelukast  (SINGULAIR ) 10 MG tablet TAKE ONE TABLET BY MOUTH EVERY DAY 01/13/23  Cari Arlean HERO, FNP  nabumetone  (RELAFEN ) 500 MG tablet Take 500 mg by mouth at bedtime.    [provider]  nitroGLYCERIN  (NITROSTAT ) 0.4 MG SL tablet Place 1 tablet (0.4 mg total) under the tongue every 5 (five) minutes as needed for chest pain. 11/17/22   Zelaya, Oscar A, PA-C  Olopatadine  HCl 0.2 % SOLN Place 1 drop into both eyes daily as needed. 11/13/21   Luke Orlan HERO, DO  omeprazole  (PRILOSEC) 40 MG capsule TAKE ONE CAPSULE BY MOUTH EVERY MORNING 30 MINUTES BEFORE MEALS 12/13/23   Jordan, Betty G, MD  Oxycodone  HCl 10 MG TABS Take 10 mg by mouth daily as needed (severe pain).    [provider]  sertraline  (ZOLOFT ) 100 MG tablet Take 1 tablet (100 mg total) by mouth daily. 03/06/24 05/05/24  Rainelle Pfeiffer, MD  triamcinolone  ointment (KENALOG ) 0.1 % Apply 1 application topically 2 (two) times daily. Patient taking differently: Apply 1 application  topically 2 (two) times daily as needed (eczema). 03/29/20   Cari Arlean HERO, FNP     Allergies  Allergen Reactions   Almond (Diagnostic) Anaphylaxis   Egg Protein-Containing Drug Products Diarrhea and Other (See Comments)    GI intolerance    Latex Anaphylaxis, Hives and Itching    Anaphylaxis is only when I am in a closed area (ex: car with balloons)   Other Other (See Comments)    Sinus headache from new plastics, carpets, etc.   Banana Other (See Comments)    Mouth burning   Food Other (See Comments)    Dates - Mouth burning   Kiwi Extract Other (See Comments)    Mouth burning   Norco [Hydrocodone -Acetaminophen ] Itching    OK with Benadryl  premed   Oxycontin  [Oxycodone ] Itching    OK with Benadryl  premed   Codeine Other (See Comments)    Headaches    Duloxetine  Hcl Nausea Only   Wound Dressing Adhesive Itching and Rash    Blistering rash    Social History   Socioeconomic History   Marital status: Married    Spouse name: Tenna Lacko   Number of children: 0   Years of education: Not on file   Highest education level: Not on file  Occupational History   Occupation: not employed-disabled  Tobacco Use   Smoking status: Never   Smokeless tobacco: Never  Vaping Use   Vaping status: Former  Substance and Sexual Activity   Alcohol use: No   Drug use: No   Sexual activity: Never  Other Topics Concern   Not on file  Social History Narrative   Born in [place] , [place]   Unemployed, on disability since 1991 for anxiety,    Education: Tree surgeon   Single, no children, lives with her friend (used to live with her mother with stroke, now in ALF)      Social Drivers of Health   Financial Resource Strain: Low Risk  (04/25/2024)   Received from [employer]   Overall Financial Resource Strain (CARDIA)    Difficulty of Paying Living Expenses: Not hard at all  Food Insecurity: No Food Insecurity (04/25/2024)   [facility]   Hunger Vital Sign    Within the past 12 months, you worried that your food  would run out before you got the money to buy more.: Never true    Within the past 12 months, the food you bought just didn't last and you didn't have money to get more.:  Never true  Transportation Needs: No Transportation Needs (04/25/2024)   Received from [facility]   PRAPARE - Transportation    Lack of Transportation (Medical): No    Lack of Transportation (Non-Medical): No  Physical Activity: Inactive (05/31/2023)   Exercise Vital Sign    Days of Exercise per Week: 0 days    Minutes of Exercise per Session: 0 min  Stress: No Stress Concern Present (05/31/2023)   [employer] of Occupational Health - Occupational Stress Questionnaire    Feeling of Stress : Not at all  Social Connections: Socially Integrated (05/31/2023)   Social Connection and Isolation Panel    Frequency of Communication with Friends and Family: More than three times a week    Frequency of Social Gatherings with Friends and Family: More than three times a week    Attends Religious Services: More than 4 times per year    Active Member of [employer] or Organizations: Yes    Attends Engineer, Structural: More than 4 times per year    Marital Status: Married  Catering Manager Violence: Not At Risk (03/31/2024)   Received from [facility]   HITS    Over the last 12 months how often did your partner physically hurt you?: Never    Over the last 12 months how often did your partner insult you or talk down to you?: Never    Over the last 12 months how often did your partner threaten you with physical harm?: Never    Over the last 12 months how often did your partner scream or curse at you?: Never    Tobacco Use: Low Risk  (08/29/2024)   Patient History    Smoking Tobacco Use: Never    Smokeless Tobacco Use: Never    Passive Exposure: Not on file   Social History   Substance and Sexual Activity  Alcohol Use No    Family History  Problem Relation Age of Onset   Arthritis Mother    Hyperlipidemia Mother     Hypertension Mother    Stroke Mother    Mental retardation Mother    Diabetes Mother    Allergic rhinitis Mother    Heart disease Mother    Thyroid  disease Mother    Depression Mother    Anxiety disorder Mother    Bipolar disorder Mother    Sleep apnea Mother    Eating disorder Mother    Heart attack Brother 57       After playing hockey   Allergic rhinitis Brother    Diabetes Maternal Grandfather    Hypertension Maternal Grandfather    Diabetes Paternal Grandmother    Hypertension Paternal Grandmother    Cancer Paternal Grandmother        breast   Lung cancer Maternal Uncle    Colon cancer Paternal Aunt    Lung cancer Other    Asthma Neg Hx    Eczema Neg Hx    Immunodeficiency Neg Hx    Urticaria Neg Hx    Atopy Neg Hx    Angioedema Neg Hx    Esophageal cancer Neg Hx    Stomach cancer Neg Hx    Rectal cancer Neg Hx     Review of Systems  Constitutional:  Negative for chills and fever.  HENT:  Negative for congestion, sore throat and tinnitus.   Eyes:  Negative for double vision, photophobia and pain.  Respiratory:  Negative for cough, shortness of breath and wheezing.   Cardiovascular:  Negative  for chest pain, palpitations and orthopnea.  Gastrointestinal:  Negative for heartburn, nausea and vomiting.  Genitourinary:  Negative for dysuria, frequency and urgency.  Musculoskeletal:  Positive for joint pain.  Neurological:  Negative for dizziness, weakness and headaches.    Objective:  Physical Exam: Well nourished and well developed.  General: Alert and oriented x3, cooperative and pleasant, no acute distress.  Head: normocephalic, atraumatic, neck supple.  Eyes: EOMI.  Musculoskeletal:  Left knee: Range of motion 3-120 degrees. Stable medially and laterally at 0 and 30 degrees of flexion. Calf is soft and nontender. TTP medial lateral joint lines. Crepitus is noted  Calves soft and nontender. Motor function intact in LE. Strength 5/5 LE  bilaterally. Neuro: Distal pulses 2+. Sensation to light touch intact in LE.  Imaging Review Plain radiographs demonstrate severe degenerative joint disease of the left knee. The overall alignment is mild varus. The bone quality appears to be adequate for age and reported activity level.  Assessment/Plan:  End stage arthritis, left knee   The patient history, physical examination, clinical judgment of the provider and imaging studies are consistent with end stage degenerative joint disease of the left knee and total knee arthroplasty is deemed medically necessary. The treatment options including medical management, injection therapy arthroscopy and arthroplasty were discussed at length. The risks and benefits of total knee arthroplasty were presented and reviewed. The risks due to aseptic loosening, infection, stiffness, patella tracking problems, thromboembolic complications and other imponderables were discussed. The patient acknowledged the explanation, agreed to proceed with the plan and consent was signed. Patient is being admitted for inpatient treatment for surgery, pain control, PT, OT, prophylactic antibiotics, VTE prophylaxis, progressive ambulation and ADLs and discharge planning. The patient is planning to be discharged home.   Patient's anticipated LOS is less than 2 midnights, meeting these requirements: - Younger than 40 - Lives within 1 hour of care - Has a competent adult at home to recover with post-op recover - NO history of  - Chronic pain requiring opiods  - Diabetes  - Coronary Artery Disease  - Heart failure  - Heart attack  - Stroke  - DVT/VTE  - Respiratory Failure/COPD  - Renal failure  - Anemia  - Advanced Liver disease  Therapy Plans: Outpatient therapy at [facility] initially. Then will transition to [facility].  Disposition: Home with friend Dawn Jordan). Going to stay in airbnb, lives in [place]. Plans to return after one week.  Planned DVT Prophylaxis: Xarelto   10 mg QD DME Needed: None PCP: Saddie Gainer, MD (calling for appt) Cardiologist: Oneil Parchment, MD (clearance received) TXA: IV Allergies: Nickel, LATEX Anesthesia Concerns: Sleep apnea BMI: 38.9 Last HgbA1c: Not diabetic  Pain Regimen: Dilaudid  Pharmacy: [facility]  Other: - Hx of afib. Oxycodone  not sufficient for pain control with right TKA. - Last dose of actemra this week  - Patient was instructed on what medications to stop prior to surgery. - Follow-up visit in 2 weeks with Dr. Melodi - Begin physical therapy following surgery - Pre-operative lab work as pre-surgical testing - Prescriptions will be provided in hospital at time of discharge  Dawn Mess, PA-C Orthopedic Surgery [facility] [place]
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
## 2024-09-26 ENCOUNTER — Ambulatory Visit (HOSPITAL_COMMUNITY): Admitting: Clinical

## 2024-10-01 NOTE — Progress Notes (Unsigned)
 Cardiology Office Note    Date:  10/02/2024  ID:  Dawn Jordan, Dawn Jordan 02-23-1966, MRN [id number] PCP:  Jordan, Betty G, MD  Cardiologist:  Oneil Parchment, MD  Electrophysiologist:  Eulas FORBES Furbish, MD   Chief Complaint: Preoperative cardiac evaluation   History of Present Illness: .    Dawn Jordan is a 58 y.o. adult with visit-pertinent history of persistent atrial fibrillation.   Cardiac PET stress in 01/2023 was normal and low risk.  Echocardiogram in 09/2023 indicated LVEF 60 to 65%, no RWMA, diastolic parameters were normal, RV systolic function and size was normal, no evidence of mitral valve regurgitation, no evidence of mitral stenosis, aortic valve regurgitation was not visualized, no aortic stenosis present, borderline dilation of the ascending aorta measuring 37 mm.  Patient has history of persistent atrial fibrillation, previously seen by Dr. Furbish in 12/2023 with intermittent episodes of chest pain, heart palpitations and shortness of breath.  She reported that symptoms typically occurred at night.  She previously had a nocturnal episode lasting 2 hours on a prior monitor in 2020.  Patient previously has been on flecainide  which helped to manage her atrial fibrillation however was weaned off and had recurrence of atrial fibrillation with RVR resulting in chest pressure in October 2024.  In the ED at that time she was noted to be in atrial fibrillation, she was resumed on flecainide .  It was noted in January that patient was under undergoing TKA in March, was cleared for procedure recommended continuing on flecainide  with plans to reevaluate atrial fibrillation and possible candidacy for ablation following her surgery.   Today Dawn Jordan presents for preoperative cardiac evaluation for left total knee arthroplasty.  Dawn Jordan reports that they are doing well overall, denies any significant chest pain, shortness of breath, lower extremity edema, orthopnea or PND.  She notes occasional palpitations  with movement such as when bending over to remove laundry from washer and dryer or when laying down flat, overall not bothersome with no associated symptoms, does not feel like previously noted atrial fibrillation.  Did notes that activity is currently limited because of knee pain however is able to keep up with 39-year-old and 13-year-old children, and is able to complete household chores.  Notes that from a cardiac standpoint has been doing well overall. ROS: .   Today they deny chest pain, shortness of breath, lower extremity edema, fatigue, melena, hematuria, hemoptysis, diaphoresis, weakness, presyncope, syncope, orthopnea, and PND.  All other systems are reviewed and otherwise negative. Studies Reviewed: SABRA   EKG:  EKG is ordered today, personally reviewed, demonstrating  EKG Interpretation Date/Time:  Monday October 02 2024 13:32:05 EDT Ventricular Rate:  71 PR Interval:  146 QRS Duration:  86 QT Interval:  384 QTC Calculation: 417 R Axis:   -26  Text Interpretation: Normal sinus rhythm Nonspecific T wave abnormality Confirmed by Dawn Jordan [phone]) on 10/02/2024 5:08:53 PM   CV Studies: Cardiac studies reviewed are outlined and summarized above. Otherwise please see EMR for full report. Cardiac Studies & Procedures   ______________________________________________________________________________________________   STRESS TESTS  NM PET CT CARDIAC PERFUSION MULTI W/ABSOLUTE BLOODFLOW 01/19/2023  Narrative   The study is normal. The study is low risk.   Rest left ventricular function is normal. Rest EF: 53 %. Stress left ventricular function is normal. Stress EF: 67 %. End diastolic cavity size is normal. End systolic cavity size is normal.   Myocardial blood flow was computed to be 0.78ml/g/min at rest and 1.92ml/g/min at stress. Global  myocardial blood flow reserve was 2.87 and was normal.   Coronary calcium was absent on the attenuation correction CT images. Minimal aortic valve  calcification.   Electronically Signed by Dawn Leavens MD  CLINICAL DATA:  This over-read does not include interpretation of cardiac or coronary anatomy or pathology. The cardiac PET-CT interpretation by the cardiologist is attached.  COMPARISON:  Prior cardiac CT 06/07/2019  FINDINGS: Vascular: Normal caliber thoracic aorta.  Mediastinum/Nodes: No mediastinal or hilar mass or lymphadenopathy. The esophagus is grossly normal.  Lungs/Pleura: Breathing motion artifact obvious pulmonary lesions or pleural effusions.  Upper Abdomen: No significant upper abdominal findings.  Musculoskeletal: No significant bony findings.  IMPRESSION: 1. No mediastinal or hilar mass or adenopathy. 2. No obvious pulmonary lesions or pleural effusions. Breathing motion artifact limits evaluation. 3. No acute upper abdominal findings. 4. This over-read does not include interpretation of cardiac or coronary anatomy or pathology.   Electronically Signed By: Dawn Jordan M.D. On: 01/19/2023 12:15   ECHOCARDIOGRAM  ECHOCARDIOGRAM COMPLETE 09/27/2023  Narrative ECHOCARDIOGRAM REPORT    Patient Name:   Dawn Jordan Date of Exam: 09/27/2023 Medical Rec #:  [id number]   Height:       65.0 in Accession #:    [id number]  Weight:       248.0 lb Date of Birth:  15-Feb-1966  BSA:          2.168 m Patient Age:    56 years    BP:           119/67 mmHg Patient Gender: F           HR:           67 bpm. Exam Location:  Inpatient  Procedure: 2D Echo, Cardiac Doppler and Color Doppler  Indications:    Atrial Fibrillation I48.91  History:        Patient has prior history of Echocardiogram examinations, most recent 06/05/2017. Arrythmias:Atrial Fibrillation, Signs/Symptoms:Chest Pain and Dyspnea; Risk Factors:Sleep Apnea and Dyslipidemia.  Sonographer:    Dawn Jordan RCS Referring Phys: Dawn Jordan  IMPRESSIONS   1. Left ventricular ejection fraction, by estimation, is 60 to 65%.  The left ventricle has normal function. The left ventricle has no regional wall motion abnormalities. Left ventricular diastolic parameters were normal. 2. Right ventricular systolic function is normal. The right ventricular size is normal. There is normal pulmonary artery systolic pressure. The estimated right ventricular systolic pressure is 21.8 mmHg. 3. The mitral valve is normal in structure. No evidence of mitral valve regurgitation. No evidence of mitral stenosis. 4. The aortic valve is tricuspid. There is mild calcification of the aortic valve. Aortic valve regurgitation is not visualized. No aortic stenosis is present. 5. Aortic dilatation noted. There is borderline dilatation of the ascending aorta, measuring 37 mm. 6. The inferior vena cava is normal in size with greater than 50% respiratory variability, suggesting right atrial pressure of 3 mmHg.  FINDINGS Left Ventricle: Left ventricular ejection fraction, by estimation, is 60 to 65%. The left ventricle has normal function. The left ventricle has no regional wall motion abnormalities. The left ventricular internal cavity size was normal in size. There is no left ventricular hypertrophy. Left ventricular diastolic parameters were normal.  Right Ventricle: The right ventricular size is normal. No increase in right ventricular wall thickness. Right ventricular systolic function is normal. There is normal pulmonary artery systolic pressure. The tricuspid regurgitant velocity is 2.17 m/s, and with an assumed right atrial pressure of 3 mmHg, the  estimated right ventricular systolic pressure is 21.8 mmHg.  Left Atrium: Left atrial size was normal in size.  Right Atrium: Right atrial size was normal in size.  Pericardium: There is no evidence of pericardial effusion.  Mitral Valve: The mitral valve is normal in structure. No evidence of mitral valve regurgitation. No evidence of mitral valve stenosis.  Tricuspid Valve: The tricuspid valve  is normal in structure. Tricuspid valve regurgitation is trivial.  Aortic Valve: The aortic valve is tricuspid. There is mild calcification of the aortic valve. Aortic valve regurgitation is not visualized. No aortic stenosis is present. Aortic valve peak gradient measures 13.7 mmHg.  Pulmonic Valve: The pulmonic valve was normal in structure. Pulmonic valve regurgitation is not visualized.  Aorta: The aortic root is normal in size and structure and aortic dilatation noted. There is borderline dilatation of the ascending aorta, measuring 37 mm.  Venous: The inferior vena cava is normal in size with greater than 50% respiratory variability, suggesting right atrial pressure of 3 mmHg.  IAS/Shunts: No atrial level shunt detected by color flow Doppler.   LEFT VENTRICLE PLAX 2D LVIDd:         4.60 cm   Diastology LVIDs:         2.80 cm   LV e' medial:    12.60 cm/s LV PW:         1.10 cm   LV E/e' medial:  7.9 LV IVS:        1.00 cm   LV e' lateral:   11.70 cm/s LVOT diam:     1.90 cm   LV E/e' lateral: 8.5 LV SV:         58 LV SV Index:   27 LVOT Area:     2.84 cm   RIGHT VENTRICLE             IVC RV S prime:     16.50 cm/s  IVC diam: 1.50 cm TAPSE (M-mode): 2.3 cm  LEFT ATRIUM             Index        RIGHT ATRIUM           Index LA diam:        3.60 cm 1.66 cm/m   RA Area:     11.50 cm LA Vol (A2C):   43.0 ml 19.82 ml/m  RA Volume:   23.40 ml  10.80 ml/m LA Vol (A4C):   65.3 ml 30.13 ml/m LA Biplane Vol: 60.3 ml 27.82 ml/m AORTIC VALVE AV Area (Vmax): 1.69 cm AV Vmax:        185.00 cm/s AV Peak Grad:   13.7 mmHg LVOT Vmax:      110.00 cm/s LVOT Vmean:     71.725 cm/s LVOT VTI:       0.205 m  AORTA Ao Root diam: 3.20 cm Ao Asc diam:  3.70 cm  MITRAL VALVE               TRICUSPID VALVE MV Area (PHT): 3.72 cm    TR Peak grad:   18.8 mmHg MV Decel Time: 204 msec    TR Vmax:        217.00 cm/s MV E velocity: 99.40 cm/s MV A velocity: 94.50 cm/s  SHUNTS MV E/A  ratio:  1.05        Systemic VTI:  0.21 m Systemic Diam: 1.90 cm  Dawn McleanMD Electronically signed by Dawn Jordan Signature Date/Time: 09/27/2023/2:10:10 PM  Final    MONITORS  LONG TERM MONITOR (3-14 DAYS) 06/08/2023  Narrative   Sinus rhythm avg 75 bpm   No atrial fibrillation   No VT   Brief episodes of atrial tachycardia - benign   Rare PVC and PAC's   Reassuring monitor   CT SCANS  CT CORONARY MORPH W/CTA COR W/SCORE 06/07/2019  Addendum 06/07/2019  1:11 PM ADDENDUM REPORT: 06/07/2019 13:08  EXAM: Cardiac/Coronary  CT  TECHNIQUE: The patient was scanned on a [employer].  FINDINGS: A 120 kV prospective scan was triggered in the descending thoracic aorta at 111 HU's. Axial non-contrast 3 mm slices were carried out through the heart. The data set was analyzed on a dedicated work station and scored using the Agatson method. Gantry rotation speed was 250 msecs and collimation was .6 mm. No beta blockade and 0.8 mg of sl NTG was given. The 3D data set was reconstructed in 5% intervals of the 67-82 % of the R-R cycle. Diastolic phases were analyzed on a dedicated work station using MPR, MIP and VRT modes. The patient received 80 cc of contrast.  Aorta:  Normal size.  No calcifications.  No dissection.  Aortic Valve:  Trileaflet.  No calcifications.  Coronary Arteries:  Normal coronary origin.  Right dominance.  RCA is a large dominant artery that gives rise to PDA and PLB. There is no plaque.  Left main is a large artery that gives rise to LAD and LCX arteries.  LAD is a large vessel that has no plaque. The vessel bifurcates distally in a pitchfork fashion.  LCX is a non-dominant artery that gives rise to a small OM1 branch and a larger distal OM2 branch/bifurcation. There is no plaque.  Other findings:  Normal pulmonary vein drainage into the left atrium.  Normal let atrial appendage without a thrombus.  Normal size of the  pulmonary artery.  IMPRESSION: 1. Coronary calcium score of 0. This was 0 percentile for age and sex matched control.  2. Normal coronary origin with right dominance.  3. No evidence of CAD.  CAD-RADS 0.  Dawn Jordan   Electronically Signed By: Dawn JAYSON Jordan M.D. On: 06/07/2019 13:08  Narrative EXAM: OVER-READ INTERPRETATION  CT CHEST  The following report is an over-read performed by radiologist Dr. Rockey Jordan of [facility] on 06/07/2019. This over-read does not include interpretation of cardiac or coronary anatomy or pathology. The coronary CTA interpretation by the cardiologist is attached.  COMPARISON:  Chest radiograph 06/04/2017  FINDINGS: Vascular: Aortic atherosclerosis. No central pulmonary embolism, on this non-dedicated study.  Mediastinum/Nodes: No imaged thoracic adenopathy.  Lungs/Pleura: No imaged pleural fluid. A 2 mm right lower lobe pulmonary nodule on image 30/12.  Upper Abdomen: Probable hepatic steatosis. Normal imaged portions of the spleen.  Musculoskeletal: Midthoracic spondylosis.  IMPRESSION: 1.  No acute findings in the imaged extracardiac chest. 2.  Aortic Atherosclerosis (ICD10-I70.0). 3. 2 mm right lower lobe pulmonary nodule. No follow-up needed if patient is low-risk. Non-contrast chest CT can be considered in 12 months if patient is high-risk. This recommendation follows the consensus statement: Guidelines for Management of Incidental Pulmonary Nodules Detected on CT Images: From the Fleischner Society 2017; Radiology 2017; 284:228-243.  Electronically Signed: By: Dawn Jordan M.D. On: 06/07/2019 09:19     ______________________________________________________________________________________________       Current Reported Medications:.    Current Meds  Medication Sig   acetaminophen  (TYLENOL ) 500 MG tablet Take 2,000 mg by mouth 2 (two) times daily  as needed for moderate pain (pain score 4-6).    ACTEMRA 162 MG/0.9ML SOSY Inject 162 mg as directed every Sunday.   clonazePAM  (KLONOPIN ) 0.5 MG tablet Take 1 tablet (0.5 mg total) by mouth 3 (three) times daily as needed for anxiety.   diphenhydrAMINE  (BENADRYL  ALLERGY ) 25 mg capsule Take 25-50mg  po qHS prn insomnia (Patient taking differently: Take 25-50 mg by mouth daily as needed for sleep or itching (premed). Take 25-50mg  po qHS prn insomnia Takes children's Benadryl )   EPINEPHrine  0.3 mg/0.3 mL IJ SOAJ injection Use for life threatening allergic reactions   fluticasone  (FLONASE ) 50 MCG/ACT nasal spray PLACE ONE SPRAY IN EACH NOSTRIL EVERY DAY (Patient taking differently: Place 1 spray into both nostrils daily as needed for allergies.)   fluticasone  (FLOVENT  HFA) 220 MCG/ACT inhaler INHALE TWO PUFFS INTO THE LUNGS EVERY MORNING & AT BEDTIME (Patient taking differently: Inhale 2 puffs into the lungs 2 (two) times daily as needed (wheezing, shortness of breath).)   gabapentin  (NEURONTIN ) 300 MG capsule Take 300 mg by mouth at bedtime.   levalbuterol  (XOPENEX  HFA) 45 MCG/ACT inhaler INHALE TWO PUFFS INTO THE LUNGS EVERY 6 HOURS AS NEEDED FOR WHEEZING   levocetirizine (XYZAL ) 5 MG tablet Take 1 tablet (5 mg total) by mouth every evening.   nabumetone  (RELAFEN ) 500 MG tablet Take 500 mg by mouth at bedtime.   Olopatadine  HCl 0.2 % SOLN Place 1 drop into both eyes daily as needed.   omeprazole  (PRILOSEC) 40 MG capsule TAKE ONE CAPSULE BY MOUTH EVERY MORNING 30 MINUTES BEFORE MEALS   Oxycodone  HCl 10 MG TABS Take 10 mg by mouth daily as needed (severe pain).   sertraline  (ZOLOFT ) 100 MG tablet Take 1 tablet (100 mg total) by mouth daily.   triamcinolone  ointment (KENALOG ) 0.1 % Apply 1 application topically 2 (two) times daily. (Patient taking differently: Apply 1 application  topically 2 (two) times daily as needed (eczema).)   [DISCONTINUED] flecainide  (TAMBOCOR ) 50 MG tablet TAKE 1 TABLET BY MOUTH EVERY 12 HOURS.   [DISCONTINUED] metoprolol   succinate (TOPROL -XL) 25 MG 24 hr tablet Take 0.5 tablets (12.5 mg total) by mouth daily.   [DISCONTINUED] nitroGLYCERIN  (NITROSTAT ) 0.4 MG SL tablet Place 1 tablet (0.4 mg total) under the tongue every 5 (five) minutes as needed for chest pain.   Physical Exam:    VS:  BP 134/84   Pulse 71   Ht 5' 4 (1.626 m)   Wt 230 lb 6.4 oz (104.5 kg)   LMP  (LMP Unknown)   SpO2 99%   BMI 39.55 kg/m    Wt Readings from Last 3 Encounters:  10/02/24 230 lb 6.4 oz (104.5 kg)  12/13/23 233 lb 3.2 oz (105.8 kg)  11/02/23 233 lb 9.6 oz (106 kg)    GEN: Well nourished, well developed in no acute distress NECK: No JVD; No carotid bruits CARDIAC: RRR, no murmurs, rubs, gallops RESPIRATORY:  Clear to auscultation without rales, wheezing or rhonchi  ABDOMEN: Soft, non-tender, non-distended EXTREMITIES:  No edema; No acute deformity     Asessement and Plan:.    Preoperative cardiac evaluation: Patient is currently pending left total knee arthroplasty on 10/16/2024 with Dr. Dempsey Jordan. Lanika  Kalan's perioperative risk of a major cardiac event is 0.4% according to the Revised Cardiac Risk Index (RCRI).  Therefore, Gavina is at low risk for perioperative complications.   Kathye's functional capacity is good at 5.38 METs according to the Duke Activity Status Index (DASI). Recommendations:According to ACC/AHA guidelines, no further  cardiovascular testing needed.  The patient may proceed to surgery at acceptable risk.  Antiplatelet and/or Anticoagulation Recommendations: Patient reports that they are no longer taking Eliquis .  Atrial fibrillation: Patient with history of atrial fibrillation, on flecainide .  Notes occasional palpitations that are overall not bothersome and are felt more when bending over such as when removing laundry or when laying down flat sleep at night.  Denies any sustained palpitations or feeling as though heart is racing for prolonged period of time.  Tolerating flecainide  and metoprolol  well,  EKG today indicates sinus rhythm.  Patient notes that they have not been on Eliquis  since November 2024, was instructed to only continue Eliquis  for a few weeks after hospitalization.  Discussed restarting at this time, palpitation deferred for now given CHA2DS2-VASc score of 1, will consider starting postprocedure, discussed that if patient would like to proceed with ablation they will need to be on anticoagulation for a period of time.  They plan to follow-up with Dr. Nancey post procedure. CHA2DS2-VASc Score = 1 [CHF History: 0, HTN History: 0, Diabetes History: 0, Stroke History: 0, Vascular Disease History: 0, Age Score: 0, Gender Score: 1].  Therefore, the patient's annual risk of stroke is 0.6 %.       Disposition: F/u with Dr. Jeffrie in six months.   Signed, Dawn Holck D Kayci [place], NP

## 2024-10-02 ENCOUNTER — Encounter (HOSPITAL_COMMUNITY): Payer: Self-pay

## 2024-10-02 ENCOUNTER — Encounter: Payer: Self-pay | Admitting: Cardiology

## 2024-10-02 ENCOUNTER — Ambulatory Visit: Attending: Cardiology | Admitting: Cardiology

## 2024-10-02 VITALS — BP 134/84 | HR 71 | Ht 64.0 in | Wt 230.4 lb

## 2024-10-02 DIAGNOSIS — R002 Palpitations: Secondary | ICD-10-CM | POA: Diagnosis not present

## 2024-10-02 DIAGNOSIS — Z01810 Encounter for preprocedural cardiovascular examination: Secondary | ICD-10-CM | POA: Insufficient documentation

## 2024-10-02 DIAGNOSIS — I4819 Other persistent atrial fibrillation: Secondary | ICD-10-CM | POA: Insufficient documentation

## 2024-10-02 MED ORDER — FLECAINIDE ACETATE 50 MG PO TABS: 50.0000 mg | ORAL_TABLET | Freq: Two times a day (BID) | ORAL | 3 refills | Status: DC

## 2024-10-02 MED ORDER — NITROGLYCERIN 0.4 MG SL SUBL: 0.4000 mg | SUBLINGUAL_TABLET | SUBLINGUAL | 3 refills | Status: AC | PRN

## 2024-10-02 MED ORDER — METOPROLOL SUCCINATE ER 25 MG PO TB24: 12.5000 mg | ORAL_TABLET | Freq: Every day | ORAL | 3 refills | Status: AC

## 2024-10-02 NOTE — Progress Notes (Addendum)
 PCP - Saddie Gainer, MD [facility] Cardiologist - Oneil Parchment, MD appt. 10-02-24 clearance  PPM/ICD -  Device Orders -  Rep Notified -   Chest x-ray - 09-27-23 EKG - 12-13-23 epic Stress Test - 2020 epic ECHO - 09-26-14 epic Cardiac Cath -  CT coronary 2020 epic Long term monitor 06-11-23  Sleep Study - yes CPAP - No Cpap  Fasting Blood Sugar -  Checks Blood Sugar __n/a___ times a day  Blood Thinner Instructions: n/a  Aspirin  Instructions:n/a  ERAS Protcol - PRE-SURGERY G2-    COVID vaccine -  Activity--Able to complete a flight of stairs with no CP or SOB  Anesthesia review: HTN, OSA, RA, A-fib, pre-DM, Asthma  Patient denies shortness of breath, fever, cough and chest pain at PAT appointment   All instructions explained to the patient, with a verbal understanding of the material. Patient agrees to go over the instructions while at home for a better understanding. Patient also instructed to self quarantine after being tested for COVID-19. The opportunity to ask questions was provided.

## 2024-10-02 NOTE — Patient Instructions (Signed)
 Medication Instructions:  Your physician recommends that you continue on your current medications as directed. Please refer to the Current Medication list given to you today.  *If you need a refill on your cardiac medications before your next appointment, please call your pharmacy*  Lab Work: NONE ordered at this time of appointment   Testing/Procedures: NONE ordered at this time of appointment   Follow-Up: At [facility], you and your health needs are our priority.  As part of our continuing mission to provide you with exceptional heart care, our providers are all part of one team.  This team includes your primary Cardiologist (physician) and Advanced Practice Providers or APPs (Physician Assistants and Nurse Practitioners) who all work together to provide you with the care you need, when you need it.  Your next appointment:   6 month(s)  Provider:   Dorothye Gathers, MD    We recommend signing up for the patient portal called MyChart.  Sign up information is provided on this After Visit Summary.  MyChart is used to connect with patients for Virtual Visits (Telemedicine).  Patients are able to view lab/test results, encounter notes, upcoming appointments, etc.  Non-urgent messages can be sent to your provider as well.   To learn more about what you can do with MyChart, go to [email or web address].

## 2024-10-02 NOTE — Patient Instructions (Addendum)
 SURGICAL WAITING ROOM VISITATION  Patients having surgery or a procedure may have no more than 2 support people in the waiting area - these visitors may rotate.    Children under the age of 33 must have an adult with them who is not the patient.  Visitors with respiratory illnesses are discouraged from visiting and should remain at home.  If the patient needs to stay at the hospital during part of their recovery, the visitor guidelines for inpatient rooms apply. Pre-op nurse will coordinate an appropriate time for 1 support person to accompany patient in pre-op.  This support person may not rotate.    Please refer to the [facility] website for the visitor guidelines for Inpatients (after your surgery is over and you are in a regular room).       Your procedure is scheduled on: 10-16-24   Report to [facility] Main Entrance    Report to admitting at      [phone] AM   Call this number if you have problems the morning of surgery [phone]   Do not eat food :After Midnight.   After Midnight you may have the following liquids until __0625____ AM/ DAY OF SURGERY   then nothing by mouth  Water  Non-Citrus Juices (without pulp, NO RED-Apple, White grape, White cranberry) Black Coffee (NO MILK/CREAM OR CREAMERS, sugar ok)  Clear Tea (NO MILK/CREAM OR CREAMERS, sugar ok) regular and decaf                             Plain Jell-O (NO RED)                                           Fruit ices (not with fruit pulp, NO RED)                                     Popsicles (NO RED)                                                               Sports drinks like Gatorade (NO RED)                   The day of surgery:  Drink ONE (1) Pre-Surgery G2 BY 0625 AM the morning of surgery. Drink in one sitting. Do not sip.  This drink was given to you during your hospital  pre-op appointment visit. Nothing else to drink after completing the  Pre-Surgery  G2.          If you have questions,  please contact your surgeon's office.   FOLLOW ANY ADDITIONAL PRE OP INSTRUCTIONS YOU RECEIVED FROM YOUR SURGEON'S OFFICE!!!     Oral Hygiene is also important to reduce your risk of infection.                                    Remember - BRUSH YOUR TEETH THE MORNING OF SURGERY WITH YOUR REGULAR TOOTHPASTE  DENTURES WILL  BE REMOVED PRIOR TO SURGERY PLEASE DO NOT APPLY Poly grip OR ADHESIVES!!!   Do NOT smoke after Midnight   Stop all vitamins and herbal supplements 7 days before surgery.   Take these medicines the morning of surgery with A SIP OF WATER : Flecainide , oxycodone  if needed, eye drops if needed, clonazepam  if needed, gabapentin , inhaler , metoprolol , bring rescue inhaler with you  DO NOT TAKE ANY ORAL DIABETIC MEDICATIONS DAY OF YOUR SURGERY  Bring CPAP mask and tubing day of surgery.                              You may not have any metal on your body including hair pins, jewelry, and body piercing             Do not wear make-up, lotions, powders, perfumes/cologne, or deodorant  Do not wear nail polish including gel and S&S, artificial/acrylic nails, or any other type of covering on natural nails including finger and toenails. If you have artificial nails, gel coating, etc. that needs to be removed by a nail salon please have this removed prior to surgery or surgery may need to be canceled/ delayed if the surgeon/ anesthesia feels like they are unable to be safely monitored.   Do not shave  4 days prior to surgery.               Men may shave face and neck.   Do not bring valuables to the hospital. [facility] IS NOT             RESPONSIBLE   FOR VALUABLES.   Contacts, glasses, dentures or bridgework may not be worn into surgery.   Bring small overnight bag day of surgery.   DO NOT BRING YOUR HOME MEDICATIONS TO THE HOSPITAL. PHARMACY WILL DISPENSE MEDICATIONS LISTED ON YOUR MEDICATION LIST TO YOU DURING YOUR ADMISSION IN THE HOSPITAL!    Patients discharged  on the day of surgery will not be allowed to drive home.  Someone NEEDS to stay with you for the first 24 hours after anesthesia.   Special Instructions: Bring a copy of your healthcare power of attorney and living will documents the day of surgery if you haven't scanned them before.              Please read over the following fact sheets you were given: IF YOU HAVE QUESTIONS ABOUT YOUR PRE-OP INSTRUCTIONS PLEASE CALL [phone].    If you test positive for Covid or have been in contact with anyone that has tested positive in the last 10 days please notify you surgeon.      Pre-operative 4 CHG Bath Instructions  DYNA-Hex 4 Chlorhexidine  Gluconate 4% Solution Antiseptic 4 fl. oz   You can play a key role in reducing the risk of infection after surgery. Your skin needs to be as free of germs as possible. You can reduce the number of germs on your skin by washing with CHG (chlorhexidine  gluconate) soap before surgery. CHG is an antiseptic soap that kills germs and continues to kill germs even after washing.   DO NOT use if you have an allergy  to chlorhexidine /CHG or antibacterial soaps. If your skin becomes reddened or irritated, stop using the CHG and notify one of our RNs at   Please shower with the CHG soap starting 4 days before surgery using the following schedule:     Please keep in mind the following:  DO NOT shave, including legs and underarms, starting the day of your first shower.   You may shave your face at any point before/day of surgery.  Place clean sheets on your bed the day you start using CHG soap. Use a clean washcloth (not used since being washed) for each shower. DO NOT sleep with pets once you start using the CHG.  CHG Shower Instructions:  If you choose to wash your hair and private area, wash first with your normal shampoo/soap.  After you use shampoo/soap, rinse your hair and body thoroughly to remove shampoo/soap residue.  Turn the water  OFF and apply about 3  tablespoons (45 ml) of CHG soap to a CLEAN washcloth.  Apply CHG soap ONLY FROM YOUR NECK DOWN TO YOUR TOES (washing for 3-5 minutes)  DO NOT use CHG soap on face, private areas, open wounds, or sores.  Pay special attention to the area where your surgery is being performed.  If you are having back surgery, having someone wash your back for you may be helpful. Wait 2 minutes after CHG soap is applied, then you may rinse off the CHG soap.  Pat dry with a clean towel  Put on clean clothes/pajamas   If you choose to wear lotion, please use ONLY the CHG-compatible lotions on the back of this paper.     Additional instructions for the day of surgery: DO NOT APPLY any lotions, deodorants, cologne, or perfumes.   Put on clean/comfortable clothes.  Brush your teeth.  Ask your nurse before applying any prescription medications to the skin.   CHG Compatible Lotions   Aveeno Moisturizing lotion  Cetaphil Moisturizing Cream  Cetaphil Moisturizing Lotion  Clairol Herbal Essence Moisturizing Lotion, Dry Skin  Clairol Herbal Essence Moisturizing Lotion, Extra Dry Skin  Clairol Herbal Essence Moisturizing Lotion, Normal Skin  Curel Age Defying Therapeutic Moisturizing Lotion with Alpha Hydroxy  Curel Extreme Care Body Lotion  Curel Soothing Hands Moisturizing Hand Lotion  Curel Therapeutic Moisturizing Cream, Fragrance-Free  Curel Therapeutic Moisturizing Lotion, Fragrance-Free  Curel Therapeutic Moisturizing Lotion, Original Formula  Eucerin Daily Replenishing Lotion  Eucerin Dry Skin Therapy Plus Alpha Hydroxy Crme  Eucerin Dry Skin Therapy Plus Alpha Hydroxy Lotion  Eucerin Original Crme  Eucerin Original Lotion  Eucerin Plus Crme Eucerin Plus Lotion  Eucerin TriLipid Replenishing Lotion  Keri Anti-Bacterial Hand Lotion  Keri Deep Conditioning Original Lotion Dry Skin Formula Softly Scented  Keri Deep Conditioning Original Lotion, Fragrance Free Sensitive Skin Formula  Keri Lotion Fast  Absorbing Fragrance Free Sensitive Skin Formula  Keri Lotion Fast Absorbing Softly Scented Dry Skin Formula  Keri Original Lotion  Keri Skin Renewal Lotion Keri Silky Smooth Lotion  Keri Silky Smooth Sensitive Skin Lotion  Nivea Body Creamy Conditioning Oil  Nivea Body Extra Enriched Lotion  Nivea Body Original Lotion  Nivea Body Sheer Moisturizing Lotion Nivea Crme  Nivea Skin Firming Lotion  NutraDerm 30 Skin Lotion  NutraDerm Skin Lotion  NutraDerm Therapeutic Skin Cream  NutraDerm Therapeutic Skin Lotion  ProShield Protective Hand Cream   Incentive Spirometer  An incentive spirometer is a tool that can help keep your lungs clear and active. This tool measures how well you are filling your lungs with each breath. Taking long deep breaths may help reverse or decrease the chance of developing breathing (pulmonary) problems (especially infection) following: A long period of time when you are unable to move or be active. BEFORE THE PROCEDURE  If the spirometer includes an indicator to show your best  effort, your nurse or respiratory therapist will set it to a desired goal. If possible, sit up straight or lean slightly forward. Try not to slouch. Hold the incentive spirometer in an upright position. INSTRUCTIONS FOR USE  Sit on the edge of your bed if possible, or sit up as far as you can in bed or on a chair. Hold the incentive spirometer in an upright position. Breathe out normally. Place the mouthpiece in your mouth and seal your lips tightly around it. Breathe in slowly and as deeply as possible, raising the piston or the ball toward the top of the column. Hold your breath for 3-5 seconds or for as long as possible. Allow the piston or ball to fall to the bottom of the column. Remove the mouthpiece from your mouth and breathe out normally. Rest for a few seconds and repeat Steps 1 through 7 at least 10 times every 1-2 hours when you are awake. Take your time and take a few normal  breaths between deep breaths. The spirometer may include an indicator to show your best effort. Use the indicator as a goal to work toward during each repetition. After each set of 10 deep breaths, practice coughing to be sure your lungs are clear. If you have an incision (the cut made at the time of surgery), support your incision when coughing by placing a pillow or rolled up towels firmly against it. Once you are able to get out of bed, walk around indoors and cough well. You may stop using the incentive spirometer when instructed by your caregiver.  RISKS AND COMPLICATIONS Take your time so you do not get dizzy or light-headed. If you are in pain, you may need to take or ask for pain medication before doing incentive spirometry. It is harder to take a deep breath if you are having pain. AFTER USE Rest and breathe slowly and easily. It can be helpful to keep track of a log of your progress. Your caregiver can provide you with a simple table to help with this. If you are using the spirometer at home, follow these instructions: SEEK MEDICAL CARE IF:  You are having difficultly using the spirometer. You have trouble using the spirometer as often as instructed. Your pain medication is not giving enough relief while using the spirometer. You develop fever of 100.5 F (38.1 C) or higher. SEEK IMMEDIATE MEDICAL CARE IF:  You cough up bloody sputum that had not been present before. You develop fever of 102 F (38.9 C) or greater. You develop worsening pain at or near the incision site. MAKE SURE YOU:  Understand these instructions. Will watch your condition. Will get help right away if you are not doing well or get worse. Document Released: 04/05/2007 Document Revised: 02/15/2012 Document Reviewed: 06/06/2007 [facility] Patient Information 2014 [place], [place].   ________________________________________________________________________

## 2024-10-04 ENCOUNTER — Ambulatory Visit (HOSPITAL_COMMUNITY): Admitting: Psychiatry

## 2024-10-06 ENCOUNTER — Encounter (HOSPITAL_COMMUNITY)

## 2024-10-09 ENCOUNTER — Other Ambulatory Visit: Payer: Self-pay

## 2024-10-09 ENCOUNTER — Encounter (HOSPITAL_COMMUNITY): Payer: Self-pay

## 2024-10-09 ENCOUNTER — Encounter (HOSPITAL_COMMUNITY)
Admission: RE | Admit: 2024-10-09 | Discharge: 2024-10-09 | Disposition: A | Discharge status: Home / Self Care | Source: Ambulatory Visit | Attending: Orthopedic Surgery | Admitting: Orthopedic Surgery

## 2024-10-09 VITALS — BP 144/95 | HR 65 | Temp 98.5°F | Resp 16 | Ht 64.0 in | Wt 223.0 lb

## 2024-10-09 DIAGNOSIS — I4891 Unspecified atrial fibrillation: Secondary | ICD-10-CM | POA: Diagnosis not present

## 2024-10-09 DIAGNOSIS — I10 Essential (primary) hypertension: Secondary | ICD-10-CM | POA: Insufficient documentation

## 2024-10-09 DIAGNOSIS — M069 Rheumatoid arthritis, unspecified: Secondary | ICD-10-CM | POA: Diagnosis not present

## 2024-10-09 DIAGNOSIS — Z01812 Encounter for preprocedural laboratory examination: Secondary | ICD-10-CM | POA: Diagnosis present

## 2024-10-09 DIAGNOSIS — G4733 Obstructive sleep apnea (adult) (pediatric): Secondary | ICD-10-CM | POA: Insufficient documentation

## 2024-10-09 DIAGNOSIS — M1712 Unilateral primary osteoarthritis, left knee: Secondary | ICD-10-CM | POA: Insufficient documentation

## 2024-10-09 DIAGNOSIS — Z01818 Encounter for other preprocedural examination: Secondary | ICD-10-CM

## 2024-10-09 HISTORY — DX: Prediabetes: R73.03

## 2024-10-09 LAB — CBC
HCT: 39.6 % (ref 36.0–46.0)
Hemoglobin: 12.8 g/dL (ref 12.0–15.0)
MCH: 28.8 pg (ref 26.0–34.0)
MCHC: 32.3 g/dL (ref 30.0–36.0)
MCV: 89 fL (ref 80.0–100.0)
Platelets: 182 K/uL (ref 150–400)
RBC: 4.45 MIL/uL (ref 3.87–5.11)
RDW: 12.3 % (ref 11.5–15.5)
WBC: 7 K/uL (ref 4.0–10.5)
nRBC: 0 % (ref 0.0–0.2)

## 2024-10-09 LAB — BASIC METABOLIC PANEL WITH GFR
Anion gap: 8 (ref 5–15)
BUN: 21 mg/dL — ABNORMAL HIGH (ref 6–20)
CO2: 27 mmol/L (ref 22–32)
Calcium: 9.6 mg/dL (ref 8.9–10.3)
Chloride: 106 mmol/L (ref 98–111)
Creatinine, Ser: 0.96 mg/dL (ref 0.44–1.00)
GFR, Estimated: >60 mL/min (ref >60)
Glucose, Bld: 101 mg/dL — ABNORMAL HIGH (ref 70–99)
Potassium: 4.6 mmol/L (ref 3.5–5.1)
Sodium: 141 mmol/L (ref 135–145)

## 2024-10-09 LAB — SURGICAL PCR SCREEN
MRSA, PCR: NEGATIVE
Staphylococcus aureus: NEGATIVE

## 2024-10-10 ENCOUNTER — Ambulatory Visit (HOSPITAL_COMMUNITY): Admitting: Clinical

## 2024-10-10 NOTE — Anesthesia Preprocedure Evaluation (Addendum)
 Anesthesia Evaluation  Patient identified by MRN, date of birth, ID band Patient awake    Reviewed: Allergy  & Precautions, NPO status , Patient's Chart, lab work & pertinent test results, reviewed documented beta blocker date and time   Airway Mallampati: III  TM Distance: >3 FB Neck ROM: Full    Dental  (+) Teeth Intact, Dental Advisory Given   Pulmonary asthma , sleep apnea    Pulmonary exam normal breath sounds clear to auscultation       Cardiovascular hypertension, Pt. on medications and Pt. on home beta blockers (-) angina (-) Past MI Normal cardiovascular exam+ dysrhythmias Atrial Fibrillation  Rhythm:Regular Rate:Normal     Neuro/Psych  Headaches PSYCHIATRIC DISORDERS Anxiety Depression     Neuromuscular disease    GI/Hepatic Neg liver ROS,GERD  ,,  Endo/Other  Obesity   Renal/GU negative Renal ROS     Musculoskeletal  (+) Arthritis , Osteoarthritis,  Fibromyalgia -  Abdominal   Peds  (+) ATTENTION DEFICIT DISORDER WITHOUT HYPERACTIVITY Hematology negative hematology ROS (+) Plt 182k   Anesthesia Other Findings Day of surgery medications reviewed with the patient.  Reproductive/Obstetrics                              Anesthesia Physical Anesthesia Plan  ASA: 3  Anesthesia Plan: Spinal   Post-op Pain Management: Tylenol  PO (pre-op)* and Regional block*   Induction: Intravenous  PONV Risk Score and Plan: 2 and TIVA, Midazolam , Dexamethasone  and Ondansetron   Airway Management Planned: Natural Airway and Simple Face Mask  Additional Equipment:   Intra-op Plan:   Post-operative Plan:   Informed Consent: I have reviewed the patients History and Physical, chart, labs and discussed the procedure including the risks, benefits and alternatives for the proposed anesthesia with the patient or authorized representative who has indicated his/her understanding and acceptance.      Dental advisory given  Plan Discussed with: CRNA  Anesthesia Plan Comments: (See PAT note 10/09/24)         Anesthesia Quick Evaluation

## 2024-10-10 NOTE — Progress Notes (Signed)
 Anesthesia Chart Review   Case: [id number] Date/Time: 10/16/24 0910   Procedure: ARTHROPLASTY, KNEE, TOTAL (Left: Knee)   Anesthesia type: Choice   Pre-op diagnosis: Left knee osteoarthritis   Location: [facility] ROOM 09 / [facility]   Surgeons: Melodi Lerner, MD       DISCUSSION:58 y.o. never smoker with h/o OSA, HTN, a-fib, RA, left knee OA scheduled for above procedure 10/16/2024 with Dr. Lerner Melodi.   Low risk stress test 01/2023.   Per cardiology preoperative evaluation 10/02/24, Patient is currently pending left total knee arthroplasty on 10/16/2024 with Dr. Lerner Melodi. Dawn Jordan's perioperative risk of a major cardiac event is 0.4% according to the Revised Cardiac Risk Index (RCRI).  Therefore, Dawn Jordan is at low risk for perioperative complications.   Dawn Jordan's functional capacity is good at 5.38 METs according to the [facility] Activity Status Index (DASI). Recommendations:According to ACC/AHA guidelines, no further cardiovascular testing needed.  The patient may proceed to surgery at acceptable risk.  Antiplatelet and/or Anticoagulation Recommendations: Patient reports that they are no longer taking Eliquis .  VS: BP (!) 144/95   Pulse 65   Temp 36.9 C (Oral)   Resp 16   Ht 5' 4 (1.626 m)   Wt 101.2 kg   LMP  (LMP Unknown)   SpO2 97%   BMI 38.28 kg/m   PROVIDERS: Eluterio Saddie Caldron, MD is PCP   Cardiologist - Oneil Parchment, MD  LABS: Labs reviewed: Acceptable for surgery. (all labs ordered are listed, but only abnormal results are displayed)  Labs Reviewed  BASIC METABOLIC PANEL WITH GFR - Abnormal; Notable for the following components:      Result Value   Glucose, Bld 101 (*)    BUN 21 (*)    All other components within normal limits  SURGICAL PCR SCREEN  CBC     IMAGES:   EKG:   CV: Echo 09/27/23  1. Left ventricular ejection fraction, by estimation, is 60 to 65%. The  left ventricle has normal function. The left ventricle has no regional  wall motion abnormalities.  Left ventricular diastolic parameters were  normal.   2. Right ventricular systolic function is normal. The right ventricular  size is normal. There is normal pulmonary artery systolic pressure. The  estimated right ventricular systolic pressure is 21.8 mmHg.   3. The mitral valve is normal in structure. No evidence of mitral valve  regurgitation. No evidence of mitral stenosis.   4. The aortic valve is tricuspid. There is mild calcification of the  aortic valve. Aortic valve regurgitation is not visualized. No aortic  stenosis is present.   5. Aortic dilatation noted. There is borderline dilatation of the  ascending aorta, measuring 37 mm.   6. The inferior vena cava is normal in size with greater than 50%  respiratory variability, suggesting right atrial pressure of 3 mmHg.  Past Medical History:  Diagnosis Date   A-fib [facility])    ADD (attention deficit disorder)    Adjustment disorder with anxious mood 11/17/2023   Anemia    Anginal pain    Anxiety    Anxiety and depression 09/27/2023   Asthma    Back pain    Bone spur of foot    Chest pain    Chronic fatigue syndrome    Complication of anesthesia    woke up during sugery once, and also with epidural at surgery center on [address] had episode with epidural   Depression    Dyspnea    Dysrhythmia  AFIB   Fibromyalgia    GERD (gastroesophageal reflux disease)    Headache    History of gout    History of kidney stones    Hypertension    IBS (irritable bowel syndrome)    Joint pain    Left ankle pain    Left sciatic nerve pain    Major depressive disorder, single episode, moderate (HCC) 03/09/2017   Outpatient: Dx. ADHD, bipolar, anxiety diagnosed many years ago (reports evaluation of ADHD in 2011, recently by Dr. Corina)  Psychiatry admission: once in 1992 for depression, hypersomnia,  Previous suicide attempt: denies, SIB of making abrasion on her hand, last in 1990's  Past trials of medication: sertraline , Paxil,  fluoxetine, citalopram, clonazepam , carbamazepine, Strattera , Adderall,    MDD (major depressive disorder), recurrent episode, moderate (HCC) 12/15/2022   Myofascial pain    Neck pain    OSA (obstructive sleep apnea)    no cpap at this time   Palpitations    Pre-diabetes    PTSD (post-traumatic stress disorder) 09/21/2016   Rheumatoid arthritis (HCC)    Seasonal allergies     Past Surgical History:  Procedure Laterality Date   CYST EXCISION     little cysts taken off both hands and left arm (06/04/2017)   KNEE ARTHROSCOPY Bilateral    3 on my left; 2 on my right (06/04/2017)   LAPAROSCOPIC CHOLECYSTECTOMY     TONSILLECTOMY     TOTAL KNEE ARTHROPLASTY Right 11/24/2021   Procedure: TOTAL KNEE ARTHROPLASTY;  Surgeon: Melodi Lerner, MD;  Location: [facility];  Service: Orthopedics;  Laterality: Right;    MEDICATIONS:  acetaminophen  (TYLENOL ) 500 MG tablet   ACTEMRA 162 MG/0.9ML SOSY   atomoxetine  (STRATTERA ) 25 MG capsule   Cannabidiol POWD   clonazePAM  (KLONOPIN ) 0.5 MG tablet   diphenhydrAMINE  (BENADRYL ) 12.5 MG/5ML elixir   EPINEPHrine  0.3 mg/0.3 mL IJ SOAJ injection   flecainide  (TAMBOCOR ) 50 MG tablet   fluticasone  (FLONASE ) 50 MCG/ACT nasal spray   fluticasone  (FLOVENT  HFA) 220 MCG/ACT inhaler   gabapentin  (NEURONTIN ) 300 MG capsule   Ibuprofen -Acetaminophen  (DUAL ACTION PAIN RELIEF) 125-250 MG TABS   levalbuterol  (XOPENEX  HFA) 45 MCG/ACT inhaler   levocetirizine (XYZAL ) 5 MG tablet   Menthol -Methyl Salicylate (SALONPAS PAIN RELIEF PATCH EX)   metoprolol  succinate (TOPROL -XL) 25 MG 24 hr tablet   nabumetone  (RELAFEN ) 500 MG tablet   nitroGLYCERIN  (NITROSTAT ) 0.4 MG SL tablet   Olopatadine  HCl 0.2 % SOLN   omeprazole  (PRILOSEC) 40 MG capsule   Oxycodone  HCl 10 MG TABS   sodium chloride  (OCEAN) 0.65 % SOLN nasal spray   triamcinolone  ointment (KENALOG ) 0.1 %   No current facility-administered medications for this encounter.      Dawn Hoots Ward, PA-C [facility]  Pre-Surgical Testing [phone]

## 2024-10-16 ENCOUNTER — Encounter (HOSPITAL_COMMUNITY): Payer: Self-pay | Admitting: Orthopedic Surgery

## 2024-10-16 ENCOUNTER — Encounter (HOSPITAL_COMMUNITY)
Admission: RE | Disposition: A | Discharge status: Home / Self Care | Payer: Self-pay | Source: Home / Self Care | Attending: Orthopedic Surgery

## 2024-10-16 ENCOUNTER — Other Ambulatory Visit: Payer: Self-pay

## 2024-10-16 ENCOUNTER — Ambulatory Visit (HOSPITAL_COMMUNITY): Admitting: Anesthesiology

## 2024-10-16 ENCOUNTER — Ambulatory Visit (HOSPITAL_COMMUNITY): Payer: Self-pay | Admitting: Physician Assistant

## 2024-10-16 ENCOUNTER — Observation Stay (HOSPITAL_COMMUNITY)
Admission: RE | Admit: 2024-10-16 | Discharge: 2024-10-18 | Disposition: A | Discharge status: Home / Self Care | Attending: Orthopedic Surgery | Admitting: Orthopedic Surgery

## 2024-10-16 DIAGNOSIS — Z79899 Other long term (current) drug therapy: Secondary | ICD-10-CM | POA: Insufficient documentation

## 2024-10-16 DIAGNOSIS — M179 Osteoarthritis of knee, unspecified: Principal | ICD-10-CM | POA: Diagnosis present

## 2024-10-16 DIAGNOSIS — M170 Bilateral primary osteoarthritis of knee: Secondary | ICD-10-CM

## 2024-10-16 DIAGNOSIS — M25562 Pain in left knee: Secondary | ICD-10-CM | POA: Diagnosis present

## 2024-10-16 DIAGNOSIS — M1712 Unilateral primary osteoarthritis, left knee: Secondary | ICD-10-CM | POA: Diagnosis not present

## 2024-10-16 DIAGNOSIS — G4733 Obstructive sleep apnea (adult) (pediatric): Secondary | ICD-10-CM

## 2024-10-16 DIAGNOSIS — J4540 Moderate persistent asthma, uncomplicated: Secondary | ICD-10-CM | POA: Diagnosis not present

## 2024-10-16 DIAGNOSIS — F418 Other specified anxiety disorders: Secondary | ICD-10-CM | POA: Diagnosis not present

## 2024-10-16 DIAGNOSIS — Z91040 Latex allergy status: Secondary | ICD-10-CM | POA: Insufficient documentation

## 2024-10-16 DIAGNOSIS — I10 Essential (primary) hypertension: Secondary | ICD-10-CM | POA: Insufficient documentation

## 2024-10-16 HISTORY — PX: TOTAL KNEE ARTHROPLASTY: SHX125

## 2024-10-16 SURGERY — ARTHROPLASTY, KNEE, TOTAL
Anesthesia: Spinal | Site: Knee | Laterality: Left

## 2024-10-16 MED ORDER — BUPIVACAINE LIPOSOME 1.3 % IJ SUSP
INTRAMUSCULAR | Status: AC
  Filled 2024-10-16: qty 20

## 2024-10-16 MED ORDER — ONDANSETRON HCL 4 MG/2ML IJ SOLN: 4.0000 mg | Freq: Four times a day (QID) | INTRAMUSCULAR | Status: DC | PRN

## 2024-10-16 MED ORDER — DIPHENHYDRAMINE HCL 12.5 MG/5ML PO ELIX
12.5000 mg | ORAL_SOLUTION | ORAL | Status: DC | PRN
  Administered 2024-10-17: 25 mg via ORAL

## 2024-10-16 MED ORDER — ONDANSETRON HCL 4 MG/2ML IJ SOLN
INTRAMUSCULAR | Status: DC | PRN
  Administered 2024-10-16: 4 mg via INTRAVENOUS

## 2024-10-16 MED ORDER — METOCLOPRAMIDE HCL 5 MG PO TABS: 5.0000 mg | ORAL_TABLET | Freq: Three times a day (TID) | ORAL | Status: DC | PRN

## 2024-10-16 MED ORDER — PROPOFOL 1000 MG/100ML IV EMUL
INTRAVENOUS | Status: AC
  Filled 2024-10-16: qty 100

## 2024-10-16 MED ORDER — METOPROLOL SUCCINATE ER 25 MG PO TB24
12.5000 mg | ORAL_TABLET | Freq: Every day | ORAL | Status: DC
  Administered 2024-10-17 – 2024-10-18 (×2): 12.5 mg via ORAL
  Filled 2024-10-16 (×2): qty 1

## 2024-10-16 MED ORDER — MIDAZOLAM HCL 2 MG/2ML IJ SOLN
INTRAMUSCULAR | Status: AC
  Filled 2024-10-16: qty 2

## 2024-10-16 MED ORDER — ATOMOXETINE HCL 25 MG PO CAPS
25.0000 mg | ORAL_CAPSULE | Freq: Two times a day (BID) | ORAL | Status: DC
  Administered 2024-10-17 – 2024-10-18 (×3): 25 mg via ORAL
  Filled 2024-10-16 (×6): qty 1

## 2024-10-16 MED ORDER — METHOCARBAMOL 1000 MG/10ML IJ SOLN
500.0000 mg | Freq: Four times a day (QID) | INTRAMUSCULAR | Status: DC | PRN
Start: 2024-10-16 — End: 2024-10-18

## 2024-10-16 MED ORDER — RIVAROXABAN 10 MG PO TABS
10.0000 mg | ORAL_TABLET | Freq: Every day | ORAL | Status: DC
  Administered 2024-10-17 – 2024-10-18 (×2): 10 mg via ORAL
  Filled 2024-10-16 (×2): qty 1

## 2024-10-16 MED ORDER — CHLORHEXIDINE GLUCONATE 0.12 % MT SOLN
15.0000 mL | Freq: Once | OROMUCOSAL | Status: AC
  Administered 2024-10-16: 15 mL via OROMUCOSAL

## 2024-10-16 MED ORDER — FLUTICASONE PROPIONATE 50 MCG/ACT NA SUSP: 1.0000 | Freq: Every day | NASAL | Status: DC | PRN

## 2024-10-16 MED ORDER — CEFAZOLIN SODIUM-DEXTROSE 2-4 GM/100ML-% IV SOLN
2.0000 g | Freq: Four times a day (QID) | INTRAVENOUS | Status: AC
  Administered 2024-10-16 (×2): 2 g via INTRAVENOUS
  Filled 2024-10-16 (×2): qty 100

## 2024-10-16 MED ORDER — PHENYLEPHRINE 80 MCG/ML (10ML) SYRINGE FOR IV PUSH (FOR BLOOD PRESSURE SUPPORT)
PREFILLED_SYRINGE | INTRAVENOUS | Status: AC
  Filled 2024-10-16: qty 10

## 2024-10-16 MED ORDER — HYDROMORPHONE HCL 2 MG PO TABS
2.0000 mg | ORAL_TABLET | ORAL | Status: DC | PRN
  Administered 2024-10-16 – 2024-10-17 (×2): 2 mg via ORAL
  Filled 2024-10-16 (×3): qty 1

## 2024-10-16 MED ORDER — ROPIVACAINE HCL 5 MG/ML IJ SOLN
INTRAMUSCULAR | Status: DC | PRN
  Administered 2024-10-16: 20 mL via PERINEURAL

## 2024-10-16 MED ORDER — TRANEXAMIC ACID-NACL 1000-0.7 MG/100ML-% IV SOLN
1000.0000 mg | INTRAVENOUS | Status: DC
  Filled 2024-10-16: qty 100

## 2024-10-16 MED ORDER — METOCLOPRAMIDE HCL 5 MG/ML IJ SOLN: 5.0000 mg | Freq: Three times a day (TID) | INTRAMUSCULAR | Status: DC | PRN

## 2024-10-16 MED ORDER — GABAPENTIN 300 MG PO CAPS
300.0000 mg | ORAL_CAPSULE | Freq: Three times a day (TID) | ORAL | Status: DC
  Administered 2024-10-16 – 2024-10-18 (×6): 300 mg via ORAL
  Filled 2024-10-16 (×6): qty 1

## 2024-10-16 MED ORDER — ACETAMINOPHEN 10 MG/ML IV SOLN
1000.0000 mg | Freq: Four times a day (QID) | INTRAVENOUS | Status: DC
  Filled 2024-10-16: qty 100

## 2024-10-16 MED ORDER — FLECAINIDE ACETATE 50 MG PO TABS
50.0000 mg | ORAL_TABLET | Freq: Two times a day (BID) | ORAL | Status: DC
  Administered 2024-10-16 – 2024-10-18 (×4): 50 mg via ORAL
  Filled 2024-10-16 (×4): qty 1

## 2024-10-16 MED ORDER — FENTANYL CITRATE (PF) 50 MCG/ML IJ SOSY
PREFILLED_SYRINGE | INTRAMUSCULAR | Status: AC
  Filled 2024-10-16: qty 2

## 2024-10-16 MED ORDER — DIPHENHYDRAMINE HCL 12.5 MG/5ML PO ELIX
12.5000 mg | ORAL_SOLUTION | Freq: Every day | ORAL | Status: DC | PRN
  Filled 2024-10-16: qty 10

## 2024-10-16 MED ORDER — LEVOCETIRIZINE DIHYDROCHLORIDE 5 MG PO TABS: 5.0000 mg | ORAL_TABLET | Freq: Every evening | ORAL | Status: DC

## 2024-10-16 MED ORDER — TRANEXAMIC ACID-NACL 1000-0.7 MG/100ML-% IV SOLN
INTRAVENOUS | Status: DC | PRN
  Administered 2024-10-16: 1000 mg via INTRAVENOUS

## 2024-10-16 MED ORDER — SODIUM CHLORIDE 0.9 % IV SOLN: INTRAVENOUS | Status: DC

## 2024-10-16 MED ORDER — LACTATED RINGERS IV SOLN: INTRAVENOUS | Status: DC | PRN

## 2024-10-16 MED ORDER — FENTANYL CITRATE (PF) 50 MCG/ML IJ SOSY
PREFILLED_SYRINGE | INTRAMUSCULAR | Status: AC
  Filled 2024-10-16: qty 1

## 2024-10-16 MED ORDER — DOCUSATE SODIUM 100 MG PO CAPS
100.0000 mg | ORAL_CAPSULE | Freq: Two times a day (BID) | ORAL | Status: DC
  Administered 2024-10-16 – 2024-10-18 (×4): 100 mg via ORAL
  Filled 2024-10-16 (×4): qty 1

## 2024-10-16 MED ORDER — PROPOFOL 500 MG/50ML IV EMUL
INTRAVENOUS | Status: DC | PRN
  Administered 2024-10-16: 50 mg via INTRAVENOUS
  Administered 2024-10-16: 80 ug/kg/min via INTRAVENOUS

## 2024-10-16 MED ORDER — LACTATED RINGERS IV SOLN: INTRAVENOUS | Status: DC

## 2024-10-16 MED ORDER — METHOCARBAMOL 500 MG PO TABS
500.0000 mg | ORAL_TABLET | Freq: Four times a day (QID) | ORAL | Status: DC | PRN
  Administered 2024-10-16 – 2024-10-18 (×4): 500 mg via ORAL
  Filled 2024-10-16 (×5): qty 1

## 2024-10-16 MED ORDER — BUPIVACAINE LIPOSOME 1.3 % IJ SUSP: 20.0000 mL | Freq: Once | INTRAMUSCULAR | Status: DC

## 2024-10-16 MED ORDER — ACETAMINOPHEN 500 MG PO TABS
1000.0000 mg | ORAL_TABLET | Freq: Four times a day (QID) | ORAL | Status: AC
  Administered 2024-10-16 – 2024-10-17 (×3): 1000 mg via ORAL
  Filled 2024-10-16 (×3): qty 2

## 2024-10-16 MED ORDER — MENTHOL 3 MG MT LOZG: 1.0000 | LOZENGE | OROMUCOSAL | Status: DC | PRN

## 2024-10-16 MED ORDER — FENTANYL CITRATE (PF) 50 MCG/ML IJ SOSY
50.0000 ug | PREFILLED_SYRINGE | INTRAMUSCULAR | Status: DC
  Administered 2024-10-16: 50 ug via INTRAVENOUS
  Filled 2024-10-16: qty 2

## 2024-10-16 MED ORDER — NITROGLYCERIN 0.4 MG SL SUBL: 0.4000 mg | SUBLINGUAL_TABLET | SUBLINGUAL | Status: DC | PRN

## 2024-10-16 MED ORDER — SODIUM CHLORIDE (PF) 0.9 % IJ SOLN
INTRAMUSCULAR | Status: AC
  Filled 2024-10-16: qty 30

## 2024-10-16 MED ORDER — ONDANSETRON HCL 4 MG/2ML IJ SOLN: 4.0000 mg | Freq: Once | INTRAMUSCULAR | Status: DC | PRN

## 2024-10-16 MED ORDER — PANTOPRAZOLE SODIUM 40 MG PO TBEC
80.0000 mg | DELAYED_RELEASE_TABLET | Freq: Every day | ORAL | Status: DC
  Administered 2024-10-16 – 2024-10-17 (×2): 80 mg via ORAL
  Filled 2024-10-16 (×2): qty 2

## 2024-10-16 MED ORDER — SODIUM CHLORIDE (PF) 0.9 % IJ SOLN
INTRAMUSCULAR | Status: DC | PRN
Start: 2024-10-16 — End: 2024-10-16
  Administered 2024-10-16: 60 mL

## 2024-10-16 MED ORDER — PHENYLEPHRINE HCL (PRESSORS) 10 MG/ML IV SOLN
INTRAVENOUS | Status: AC
  Filled 2024-10-16: qty 1

## 2024-10-16 MED ORDER — 0.9 % SODIUM CHLORIDE (POUR BTL) OPTIME
TOPICAL | Status: DC | PRN
Start: 2024-10-16 — End: 2024-10-16
  Administered 2024-10-16: 1000 mL

## 2024-10-16 MED ORDER — PHENOL 1.4 % MT LIQD: 1.0000 | OROMUCOSAL | Status: DC | PRN

## 2024-10-16 MED ORDER — POVIDONE-IODINE 10 % EX SWAB: 2.0000 | Freq: Once | CUTANEOUS | Status: DC

## 2024-10-16 MED ORDER — BUDESONIDE 0.25 MG/2ML IN SUSP
0.2500 mg | Freq: Two times a day (BID) | RESPIRATORY_TRACT | Status: DC
  Administered 2024-10-17 – 2024-10-18 (×2): 0.25 mg via RESPIRATORY_TRACT
  Filled 2024-10-16 (×3): qty 2

## 2024-10-16 MED ORDER — ONDANSETRON HCL 4 MG/2ML IJ SOLN
INTRAMUSCULAR | Status: AC
  Filled 2024-10-16: qty 2

## 2024-10-16 MED ORDER — DEXAMETHASONE SOD PHOSPHATE PF 10 MG/ML IJ SOLN: 8.0000 mg | Freq: Once | INTRAMUSCULAR | Status: DC

## 2024-10-16 MED ORDER — FENTANYL CITRATE (PF) 50 MCG/ML IJ SOSY
25.0000 ug | PREFILLED_SYRINGE | INTRAMUSCULAR | Status: DC | PRN
  Administered 2024-10-16 (×3): 50 ug via INTRAVENOUS

## 2024-10-16 MED ORDER — LIDOCAINE HCL (PF) 2 % IJ SOLN
INTRAMUSCULAR | Status: AC
  Filled 2024-10-16: qty 5

## 2024-10-16 MED ORDER — PHENYLEPHRINE 80 MCG/ML (10ML) SYRINGE FOR IV PUSH (FOR BLOOD PRESSURE SUPPORT)
PREFILLED_SYRINGE | INTRAVENOUS | Status: DC | PRN
  Administered 2024-10-16: 80 ug via INTRAVENOUS

## 2024-10-16 MED ORDER — ONDANSETRON HCL 4 MG PO TABS: 4.0000 mg | ORAL_TABLET | Freq: Four times a day (QID) | ORAL | Status: DC | PRN

## 2024-10-16 MED ORDER — BISACODYL 10 MG RE SUPP: 10.0000 mg | Freq: Every day | RECTAL | Status: DC | PRN

## 2024-10-16 MED ORDER — HYDROMORPHONE HCL 1 MG/ML IJ SOLN
0.5000 mg | INTRAMUSCULAR | Status: DC | PRN
  Administered 2024-10-16 – 2024-10-17 (×3): 1 mg via INTRAVENOUS
  Filled 2024-10-16 (×3): qty 1

## 2024-10-16 MED ORDER — FENTANYL CITRATE (PF) 100 MCG/2ML IJ SOLN
INTRAMUSCULAR | Status: DC | PRN
  Administered 2024-10-16 (×2): 50 ug via INTRAVENOUS

## 2024-10-16 MED ORDER — SALINE SPRAY 0.65 % NA SOLN: 1.0000 | NASAL | Status: DC | PRN

## 2024-10-16 MED ORDER — SODIUM CHLORIDE 0.9 % IR SOLN
Status: DC | PRN
  Administered 2024-10-16: 1000 mL

## 2024-10-16 MED ORDER — ALBUTEROL SULFATE (2.5 MG/3ML) 0.083% IN NEBU
2.5000 mg | INHALATION_SOLUTION | Freq: Four times a day (QID) | RESPIRATORY_TRACT | Status: DC
  Administered 2024-10-16 – 2024-10-17 (×2): 2.5 mg via RESPIRATORY_TRACT
  Filled 2024-10-16 (×2): qty 3

## 2024-10-16 MED ORDER — HYDROMORPHONE HCL 2 MG PO TABS
1.0000 mg | ORAL_TABLET | ORAL | Status: DC | PRN
  Administered 2024-10-16: 2 mg via ORAL

## 2024-10-16 MED ORDER — STERILE WATER FOR IRRIGATION IR SOLN
Status: DC | PRN
  Administered 2024-10-16: 1000 mL

## 2024-10-16 MED ORDER — LORATADINE 10 MG PO TABS
10.0000 mg | ORAL_TABLET | Freq: Every day | ORAL | Status: DC
  Administered 2024-10-17 – 2024-10-18 (×2): 10 mg via ORAL
  Filled 2024-10-16 (×2): qty 1

## 2024-10-16 MED ORDER — FENTANYL CITRATE (PF) 100 MCG/2ML IJ SOLN
INTRAMUSCULAR | Status: AC
  Filled 2024-10-16: qty 2

## 2024-10-16 MED ORDER — BUPIVACAINE LIPOSOME 1.3 % IJ SUSP
INTRAMUSCULAR | Status: DC | PRN
  Administered 2024-10-16: 20 mL

## 2024-10-16 MED ORDER — MIDAZOLAM HCL (PF) 2 MG/2ML IJ SOLN
1.0000 mg | INTRAMUSCULAR | Status: DC
  Administered 2024-10-16: 2 mg via INTRAVENOUS
  Filled 2024-10-16: qty 2

## 2024-10-16 MED ORDER — MIDAZOLAM HCL (PF) 2 MG/2ML IJ SOLN
INTRAMUSCULAR | Status: DC | PRN
  Administered 2024-10-16: 2 mg via INTRAVENOUS

## 2024-10-16 MED ORDER — CEFAZOLIN SODIUM-DEXTROSE 2-4 GM/100ML-% IV SOLN
2.0000 g | INTRAVENOUS | Status: AC
  Administered 2024-10-16: 2 g via INTRAVENOUS
  Filled 2024-10-16: qty 100

## 2024-10-16 MED ORDER — DEXAMETHASONE SOD PHOSPHATE PF 10 MG/ML IJ SOLN
10.0000 mg | Freq: Once | INTRAMUSCULAR | Status: AC
  Administered 2024-10-17: 10 mg via INTRAVENOUS

## 2024-10-16 MED ORDER — AMISULPRIDE (ANTIEMETIC) 5 MG/2ML IV SOLN: 10.0000 mg | Freq: Once | INTRAVENOUS | Status: DC | PRN

## 2024-10-16 MED ORDER — ORAL CARE MOUTH RINSE: 15.0000 mL | Freq: Once | OROMUCOSAL | Status: AC

## 2024-10-16 MED ORDER — POLYETHYLENE GLYCOL 3350 17 G PO PACK: 17.0000 g | PACK | Freq: Every day | ORAL | Status: DC | PRN

## 2024-10-16 MED ORDER — FLEET ENEMA RE ENEM: 1.0000 | ENEMA | Freq: Once | RECTAL | Status: DC | PRN

## 2024-10-16 MED ORDER — OLOPATADINE HCL 0.1 % OP SOLN
1.0000 [drp] | Freq: Two times a day (BID) | OPHTHALMIC | Status: DC
  Administered 2024-10-17 – 2024-10-18 (×3): 1 [drp] via OPHTHALMIC
  Filled 2024-10-16: qty 5

## 2024-10-16 MED ORDER — LEVALBUTEROL TARTRATE 45 MCG/ACT IN AERO: 2.0000 | INHALATION_SPRAY | Freq: Four times a day (QID) | RESPIRATORY_TRACT | Status: DC

## 2024-10-16 MED ORDER — BUPIVACAINE IN DEXTROSE 0.75-8.25 % IT SOLN
INTRATHECAL | Status: DC | PRN
  Administered 2024-10-16: 1.6 mL via INTRATHECAL

## 2024-10-16 SURGICAL SUPPLY — 42 items
AUGMENT FEM CMT STD 8 LT (Joint) IMPLANT
BAG COUNTER SPONGE SURGICOUNT (BAG) IMPLANT
BAG ZIPLOCK 12X15 (MISCELLANEOUS) ×1 IMPLANT
BLADE SAG 18X100X1.27 (BLADE) ×1 IMPLANT
BLADE SAW SGTL 11.0X1.19X90.0M (BLADE) ×1 IMPLANT
BNDG ELASTIC 6INX 5YD STR LF (GAUZE/BANDAGES/DRESSINGS) ×1 IMPLANT
BOWL SMART MIX CTS (DISPOSABLE) ×1 IMPLANT
CEMENT HV SMART SET (Cement) ×2 IMPLANT
COVER SURGICAL LIGHT HANDLE (MISCELLANEOUS) ×1 IMPLANT
CUFF TRNQT CYL 34X4.125X (TOURNIQUET CUFF) ×1 IMPLANT
DERMABOND ADVANCED .7 DNX12 (GAUZE/BANDAGES/DRESSINGS) ×1 IMPLANT
DRAPE U-SHAPE 47X51 STRL (DRAPES) ×1 IMPLANT
DRSG AQUACEL AG ADV 3.5X10 (GAUZE/BANDAGES/DRESSINGS) ×1 IMPLANT
DURAPREP 26ML APPLICATOR (WOUND CARE) ×1 IMPLANT
ELECT REM PT RETURN 15FT ADLT (MISCELLANEOUS) ×1 IMPLANT
GLOVE BIO SURGEON STRL SZ 6.5 (GLOVE) IMPLANT
GLOVE BIO SURGEON STRL SZ8 (GLOVE) ×1 IMPLANT
GLOVE BIOGEL PI IND STRL 7.0 (GLOVE) ×1 IMPLANT
GLOVE BIOGEL PI IND STRL 8 (GLOVE) ×1 IMPLANT
GOWN STRL REUS W/ TWL LRG LVL3 (GOWN DISPOSABLE) ×1 IMPLANT
HOLDER FOLEY CATH W/STRAP (MISCELLANEOUS) ×1 IMPLANT
IMMOBILIZER KNEE 20 THIGH 36 (SOFTGOODS) ×1 IMPLANT
INSERT TIBIAL PLY L EF6-9X10 (Knees) IMPLANT
KIT TURNOVER KIT A (KITS) ×1 IMPLANT
MANIFOLD NEPTUNE II (INSTRUMENTS) ×1 IMPLANT
NS IRRIG 1000ML POUR BTL (IV SOLUTION) ×1 IMPLANT
PACK TOTAL KNEE CUSTOM (KITS) ×1 IMPLANT
PADDING CAST COTTON 6X4 STRL (CAST SUPPLIES) ×2 IMPLANT
PENCIL SMOKE EVACUATOR (MISCELLANEOUS) ×1 IMPLANT
PIN DRILL HDLS TROCAR 75 4PK (PIN) IMPLANT
PROTECTOR NERVE ULNAR (MISCELLANEOUS) ×1 IMPLANT
SCREW FEMALE HEX FIX 25X2.5 (ORTHOPEDIC DISPOSABLE SUPPLIES) IMPLANT
SET HNDPC FAN SPRY TIP SCT (DISPOSABLE) ×1 IMPLANT
STEM POLY PAT PLY 32M KNEE (Knees) IMPLANT
STEM TIBIA 5 DEG SZ E L KNEE (Knees) IMPLANT
SUT MNCRL AB 4-0 PS2 18 (SUTURE) ×1 IMPLANT
SUT VIC AB 2-0 CT1 TAPERPNT 27 (SUTURE) ×3 IMPLANT
SUTURE STRATFX 0 PDS 27 VIOLET (SUTURE) ×1 IMPLANT
TOWEL GREEN STERILE FF (TOWEL DISPOSABLE) ×1 IMPLANT
TRAY FOLEY MTR SLVR 16FR STAT (SET/KITS/TRAYS/PACK) ×1 IMPLANT
TUBE SUCTION HIGH CAP CLEAR NV (SUCTIONS) ×1 IMPLANT
WRAP KNEE MAXI GEL POST OP (GAUZE/BANDAGES/DRESSINGS) ×1 IMPLANT

## 2024-10-16 NOTE — Anesthesia Procedure Notes (Signed)
 Anesthesia Regional Block: Adductor canal block   Pre-Anesthetic Checklist: , timeout performed,  Correct Patient, Correct Site, Correct Laterality,  Correct Procedure, Correct Position, site marked,  Risks and benefits discussed,  Surgical consent,  Pre-op evaluation,  At surgeon's request and post-op pain management  Laterality: Left  Prep: chloraprep       Needles:  Injection technique: Single-shot  Needle Type: Echogenic Needle     Needle Length: 9cm  Needle Gauge: 21     Additional Needles:   Procedures:,,,, ultrasound used (permanent image in chart),,    Narrative:  Start time: 10/16/2024 8:25 AM End time: 10/16/2024 8:33 AM Injection made incrementally with aspirations every 5 mL.  Performed by: Personally  Anesthesiologist: Corinne Garnette BRAVO, MD  Additional Notes: No pain on injection. No increased resistance to injection. Injection made in 5cc increments.  Good needle visualization.  Patient tolerated procedure well.

## 2024-10-16 NOTE — Progress Notes (Signed)
 Orthopedic Tech Progress Note Patient Details:  Jaide Hillenburg [birth year] [phone] Applied CPM per order. Will remove at 4pm.  CPM Left Knee CPM Left Knee: On Left Knee Flexion (Degrees): 40 Left Knee Extension (Degrees): 10  Post Interventions Patient Tolerated: Well Instructions Provided: Adjustment of device, Care of device, Poper ambulation with device Ortho Devices Type of Ortho Device: CPM padding Ortho Device/Splint Location: LLE Ortho Device/Splint Interventions: Ordered, Application, Adjustment   Post Interventions Patient Tolerated: Well Instructions Provided: Adjustment of device, Care of device, Poper ambulation with device  Morna Pink 10/16/2024, 12:01 PM

## 2024-10-16 NOTE — Anesthesia Procedure Notes (Signed)
 Spinal  Patient location during procedure: OR Start time: 10/16/2024 9:13 AM End time: 10/16/2024 9:16 AM Reason for block: surgical anesthesia Staffing Performed: anesthesiologist  Anesthesiologist: Corinne Garnette BRAVO, MD Performed by: Corinne Garnette BRAVO, MD Authorized by: Corinne Garnette BRAVO, MD   Preanesthetic Checklist Completed: patient identified, IV checked, risks and benefits discussed, surgical consent, monitors and equipment checked, pre-op evaluation and timeout performed Spinal Block Patient position: sitting Prep: DuraPrep and site prepped and draped Patient monitoring: continuous pulse ox and blood pressure Approach: midline Location: L3-4 Injection technique: single-shot Needle Needle type: Pencan  Needle gauge: 24 G Assessment Events: CSF return Additional Notes Functioning IV was confirmed and monitors were applied. Sterile prep and drape, including hand hygiene, mask and sterile gloves were used. The patient was positioned and the spine was prepped. The skin was anesthetized with lidocaine .  Free flow of clear CSF was obtained prior to injecting local anesthetic into the CSF.  The spinal needle aspirated freely following injection.  The needle was carefully withdrawn.  The patient tolerated the procedure well. Consent was obtained prior to procedure with all questions answered and concerns addressed. Risks including but not limited to bleeding, infection, nerve damage, paralysis, failed block, inadequate analgesia, allergic reaction, high spinal, itching and headache were discussed and the patient wished to proceed.   Garnette Corinne, MD

## 2024-10-16 NOTE — Discharge Instructions (Addendum)
 Frank Aluisio, MD Total Joint Specialist [facility] [address] [place], [place] [address] [phone]  TOTAL KNEE REPLACEMENT POSTOPERATIVE DIRECTIONS    Knee Rehabilitation, Guidelines Following Surgery  Results after knee surgery are often greatly improved when you follow the exercise, range of motion and muscle strengthening exercises prescribed by your doctor. Safety measures are also important to protect the knee from further injury. If any of these exercises cause you to have increased pain or swelling in your knee joint, decrease the amount until you are comfortable again and slowly increase them. If you have problems or questions, call your caregiver or physical therapist for advice.    BLOOD CLOT PREVENTION Take a 10 mg Xarelto once a day for three weeks following surgery. Then take an 81 mg Aspirin once a day for three weeks. Then discontinue Aspirin. You may resume your vitamins/supplements once you have discontinued the Xarelto. Do not take any NSAIDs (Advil, Aleve, Ibuprofen, Meloxicam, etc.) until you have discontinued the Xarelto.    HOME CARE INSTRUCTIONS  Remove items at home which could result in a fall. This includes throw rugs or furniture in walking pathways.  ICE to the affected knee as much as tolerated. Icing helps control swelling. If the swelling is well controlled you will be more comfortable and rehab easier. Continue to use ice on the knee for pain and swelling from surgery. You may notice swelling that will progress down to the foot and ankle. This is normal after surgery. Elevate the leg when you are not up walking on it.    Continue to use the breathing machine which will help keep your temperature down. It is common for your temperature to cycle up and down following surgery, especially at night when you are not up moving around and exerting yourself. The breathing machine keeps your lungs expanded and your temperature  down. Do not place pillow under the operative knee, focus on keeping the knee straight while resting  DIET You may resume your previous home diet once you are discharged from the hospital.  DRESSING / WOUND CARE / SHOWERING Keep your bulky bandage on for 2 days. On the third post-operative day you may remove the Ace bandage and gauze. There is a waterproof adhesive bandage on your skin which will stay in place until your first follow-up appointment. Once you remove this you will not need to place another bandage You may begin showering 3 days following surgery, but do not submerge the incision under water.  ACTIVITY For the first 5 days, the key is rest and control of pain and swelling Do your home exercises twice a day starting on post-operative day 3. On the days you go to physical therapy, just do the home exercises once that day. You should rest, ice and elevate the leg for 50 minutes out of every hour. Get up and walk/stretch for 10 minutes per hour. After 5 days you can increase your activity slowly as tolerated. Walk with your walker as instructed. Use the walker until you are comfortable transitioning to a cane. Walk with the cane in the opposite hand of the operative leg. You may discontinue the cane once you are comfortable and walking steadily. Avoid periods of inactivity such as sitting longer than an hour when not asleep. This helps prevent blood clots.  You may discontinue the knee immobilizer once you are able to perform a straight leg raise while lying down. You may resume a sexual relationship in one   month or when given the OK by your doctor.  You may return to work once you are cleared by your doctor.  Do not drive a car for 6 weeks or until released by your surgeon.  Do not drive while taking narcotics.  TED HOSE STOCKINGS Wear the elastic stockings on both legs for three weeks following surgery during the day. You may remove them at night for sleeping.  WEIGHT  BEARING Weight bearing as tolerated with assist device (walker, cane, etc) as directed, use it as long as suggested by your surgeon or therapist, typically at least 4-6 weeks.  POSTOPERATIVE CONSTIPATION PROTOCOL Constipation - defined medically as fewer than three stools per week and severe constipation as less than one stool per week.  One of the most common issues patients have following surgery is constipation.  Even if you have a regular bowel pattern at home, your normal regimen is likely to be disrupted due to multiple reasons following surgery.  Combination of anesthesia, postoperative narcotics, change in appetite and fluid intake all can affect your bowels.  In order to avoid complications following surgery, here are some recommendations in order to help you during your recovery period.  Colace (docusate) - Pick up an over-the-counter form of Colace or another stool softener and take twice a day as long as you are requiring postoperative pain medications.  Take with a full glass of water daily.  If you experience loose stools or diarrhea, hold the colace until you stool forms back up. If your symptoms do not get better within 1 week or if they get worse, check with your doctor. Dulcolax (bisacodyl) - Pick up over-the-counter and take as directed by the product packaging as needed to assist with the movement of your bowels.  Take with a full glass of water.  Use this product as needed if not relieved by Colace only.  MiraLax (polyethylene glycol) - Pick up over-the-counter to have on hand. MiraLax is a solution that will increase the amount of water in your bowels to assist with bowel movements.  Take as directed and can mix with a glass of water, juice, soda, coffee, or tea. Take if you go more than two days without a movement. Do not use MiraLax more than once per day. Call your doctor if you are still constipated or irregular after using this medication for 7 days in a row.  If you continue  to have problems with postoperative constipation, please contact the office for further assistance and recommendations.  If you experience "the worst abdominal pain ever" or develop nausea or vomiting, please contact the office immediatly for further recommendations for treatment.  ITCHING If you experience itching with your medications, try taking only a single pain pill, or even half a pain pill at a time.  You can also use Benadryl over the counter for itching or also to help with sleep.   MEDICATIONS See your medication summary on the "After Visit Summary" that the nursing staff will review with you prior to discharge.  You may have some home medications which will be placed on hold until you complete the course of blood thinner medication.  It is important for you to complete the blood thinner medication as prescribed by your surgeon.  Continue your approved medications as instructed at time of discharge.  PRECAUTIONS If you experience chest pain or shortness of breath - call 911 immediately for transfer to the hospital emergency department.  If you develop a fever greater that 101   F, purulent drainage from wound, increased redness or drainage from wound, foul odor from the wound/dressing, or calf pain - CONTACT YOUR SURGEON.                                                   FOLLOW-UP APPOINTMENTS Make sure you keep all of your appointments after your operation with your surgeon and caregivers. You should call the office at the above phone number and make an appointment for approximately two weeks after the date of your surgery or on the date instructed by your surgeon outlined in the "After Visit Summary".  RANGE OF MOTION AND STRENGTHENING EXERCISES  Rehabilitation of the knee is important following a knee injury or an operation. After just a few days of immobilization, the muscles of the thigh which control the knee become weakened and shrink (atrophy). Knee exercises are designed to build up  the tone and strength of the thigh muscles and to improve knee motion. Often times heat used for twenty to thirty minutes before working out will loosen up your tissues and help with improving the range of motion but do not use heat for the first two weeks following surgery. These exercises can be done on a training (exercise) mat, on the floor, on a table or on a bed. Use what ever works the best and is most comfortable for you Knee exercises include:  Leg Lifts - While your knee is still immobilized in a splint or cast, you can do straight leg raises. Lift the leg to 60 degrees, hold for 3 sec, and slowly lower the leg. Repeat 10-20 times 2-3 times daily. Perform this exercise against resistance later as your knee gets better.  Quad and Hamstring Sets - Tighten up the muscle on the front of the thigh (Quad) and hold for 5-10 sec. Repeat this 10-20 times hourly. Hamstring sets are done by pushing the foot backward against an object and holding for 5-10 sec. Repeat as with quad sets.  Leg Slides: Lying on your back, slowly slide your foot toward your buttocks, bending your knee up off the floor (only go as far as is comfortable). Then slowly slide your foot back down until your leg is flat on the floor again. Angel Wings: Lying on your back spread your legs to the side as far apart as you can without causing discomfort.  A rehabilitation program following serious knee injuries can speed recovery and prevent re-injury in the future due to weakened muscles. Contact your doctor or a physical therapist for more information on knee rehabilitation.   POST-OPERATIVE OPIOID TAPER INSTRUCTIONS: It is important to wean off of your opioid medication as soon as possible. If you do not need pain medication after your surgery it is ok to stop day one. Opioids include: Codeine, Hydrocodone(Norco, Vicodin), Oxycodone(Percocet, oxycontin) and hydromorphone amongst others.  Long term and even short term use of opiods can  cause: Increased pain response Dependence Constipation Depression Respiratory depression And more.  Withdrawal symptoms can include Flu like symptoms Nausea, vomiting And more Techniques to manage these symptoms Hydrate well Eat regular healthy meals Stay active Use relaxation techniques(deep breathing, meditating, yoga) Do Not substitute Alcohol to help with tapering If you have been on opioids for less than two weeks and do not have pain than it is ok to stop all together.    Plan to wean off of opioids This plan should start within one week post op of your joint replacement. Maintain the same interval or time between taking each dose and first decrease the dose.  Cut the total daily intake of opioids by one tablet each day Next start to increase the time between doses. The last dose that should be eliminated is the evening dose.   IF YOU ARE TRANSFERRED TO A SKILLED REHAB FACILITY If the patient is transferred to a skilled rehab facility following release from the hospital, a list of the current medications will be sent to the facility for the patient to continue.  When discharged from the skilled rehab facility, please have the facility set up the patient's Home Health Physical Therapy prior to being released. Also, the skilled facility will be responsible for providing the patient with their medications at time of release from the facility to include their pain medication, the muscle relaxants, and their blood thinner medication. If the patient is still at the rehab facility at time of the two week follow up appointment, the skilled rehab facility will also need to assist the patient in arranging follow up appointment in our office and any transportation needs.  MAKE SURE YOU:  Understand these instructions.  Get help right away if you are not doing well or get worse.   DENTAL ANTIBIOTICS:  In most cases prophylactic antibiotics for Dental procdeures after total joint surgery are  not necessary.  Exceptions are as follows:  1. History of prior total joint infection  2. Severely immunocompromised (Organ Transplant, cancer chemotherapy, Rheumatoid biologic meds such as Humera)  3. Poorly controlled diabetes (A1C &gt; 8.0, blood glucose over 200)  If you have one of these conditions, contact your surgeon for an antibiotic prescription, prior to your dental procedure.    Pick up stool softner and laxative for home use following surgery while on pain medications. Do not submerge incision under water. Please use good hand washing techniques while changing dressing each day. May shower starting three days after surgery. Please use a clean towel to pat the incision dry following showers. Continue to use ice for pain and swelling after surgery. Do not use any lotions or creams on the incision until instructed by your surgeon.      Information on my medicine - XARELTO (Rivaroxaban)  This medication education was reviewed with me or my healthcare representative as part of my discharge preparation.    Why was Xarelto prescribed for you? Xarelto was prescribed for you to reduce the risk of blood clots forming after orthopedic surgery. The medical term for these abnormal blood clots is venous thromboembolism (VTE).  What do you need to know about xarelto ? Take your Xarelto ONCE DAILY at the same time every day. You may take it either with or without food.  If you have difficulty swallowing the tablet whole, you may crush it and mix in applesauce just prior to taking your dose.  Take Xarelto exactly as prescribed by your doctor and DO NOT stop taking Xarelto without talking to the doctor who prescribed the medication.  Stopping without other VTE prevention medication to take the place of Xarelto may increase your risk of developing a clot.  After discharge, you should have regular check-up appointments with your healthcare provider that is prescribing your  Xarelto.    What do you do if you miss a dose? If you miss a dose, take it as soon as you remember on the same   day then continue your regularly scheduled once daily regimen the next day. Do not take two doses of Xarelto on the same day.   Important Safety Information A possible side effect of Xarelto is bleeding. You should call your healthcare provider right away if you experience any of the following: Bleeding from an injury or your nose that does not stop. Unusual colored urine (red or dark brown) or unusual colored stools (red or black). Unusual bruising for unknown reasons. A serious fall or if you hit your head (even if there is no bleeding).  Some medicines may interact with Xarelto and might increase your risk of bleeding while on Xarelto. To help avoid this, consult your healthcare provider or pharmacist prior to using any new prescription or non-prescription medications, including herbals, vitamins, non-steroidal anti-inflammatory drugs (NSAIDs) and supplements.  This website has more information on Xarelto: www.xarelto.com.   

## 2024-10-16 NOTE — Op Note (Signed)
 OPERATIVE REPORT-TOTAL KNEE ARTHROPLASTY   Pre-operative diagnosis- Osteoarthritis  Left knee(s)  Post-operative diagnosis- Osteoarthritis Left knee(s)  Procedure-  Left  Total Knee Arthroplasty (Zimmer [employer] knee secondary to nickel allergy )  Surgeon- Dempsey GAILS. Worley Radermacher, MD  Assistant- [name], PA-C   Anesthesia-  Adductor canal block and spinal  EBL- 25 ml   Drains None  Tourniquet time-  Total Tourniquet Time Documented: Thigh (Left) - 46 minutes Total: Thigh (Left) - 46 minutes     Complications- None  Condition-PACU - hemodynamically stable.   Brief Clinical Note  Dawn Jordan is a 58 y.o. year old adult with end stage OA of her left knee with progressively worsening pain and dysfunction. She has constant pain, with activity and at rest and significant functional deficits with difficulties even with ADLs. She has had extensive non-op management including analgesics, injections of cortisone and viscosupplements, and home exercise program, but remains in significant pain with significant dysfunction. Radiographs show bone on bone arthritis medial and patellofemoral. She presents now for left Total Knee Arthroplasty.     Procedure in detail---   The patient is brought into the operating room and positioned supine on the operating table. After successful administration of  Adductor canal block and spinal,   a tourniquet is placed high on the  Left thigh(s) and the lower extremity is prepped and draped in the usual sterile fashion. Time out is performed by the operating team and then the  Left lower extremity is wrapped in Esmarch, knee flexed and the tourniquet inflated to 300 mmHg.       A midline incision is made with a ten blade through the subcutaneous tissue to the level of the extensor mechanism. A fresh blade is used to make a medial parapatellar arthrotomy. Soft tissue over the proximal medial tibia is subperiosteally elevated to the joint line with a  knife and into the semimembranosus bursa with a Cobb elevator. Soft tissue over the proximal lateral tibia is elevated with attention being paid to avoiding the patellar tendon on the tibial tubercle. The patella is everted, knee flexed 90 degrees and the ACL and PCL are removed. Findings are bone on bone medial and patellofemoral with large global osteophytes        The drill is used to create a starting hole in the distal femur and the canal is thoroughly irrigated with sterile saline to remove the fatty contents. The 5 degree Left  valgus alignment guide is placed into the femoral canal and the distal femoral cutting block is pinned to remove 10 mm off the distal femur. Resection is made with an oscillating saw.      The tibia is subluxed forward and the menisci are removed. The extramedullary alignment guide is placed referencing proximally at the medial aspect of the tibial tubercle and distally along the second metatarsal axis and tibial crest. The block is pinned to remove 2mm off the more deficient medial  side. Resection is made with an oscillating saw. Size E is the most appropriate size for the tibia and the proximal tibia is prepared with the modular drill and keel punch for that size.      The femoral sizing guide is placed and size 8 is most appropriate. Rotation is marked off the epicondylar axis and confirmed by creating a rectangular flexion gap at 90 degrees. The size 8 cutting block is pinned in this rotation and the anterior, posterior and chamfer cuts are made with the oscillating saw. The intercondylar  block is then placed and that cut is made.      Trial size E tibial component, trial size 8 posterior stabilized femur and a 10  mm posterior stabilized fixed bearing insert trial is placed. Full extension is achieved with excellent varus/valgus and anterior/posterior balance throughout full range of motion. The patella is everted and thickness measured to be 22  mm. Free hand resection is  taken to 12 mm, a 32 template is placed, lug holes are drilled, trial patella is placed, and it tracks normally. Osteophytes are removed off the posterior femur with the trial in place. All trials are removed and the cut bone surfaces prepared with pulsatile lavage. Cement is mixed and once ready for implantation, the size E tibial implant, size  8 posterior stabilized femoral component, and the size 32 patella are cemented in place and the patella is held with the clamp. The trial insert is placed and the knee held in full extension. The Exparel  (20 ml mixed with 60 ml saline) is injected into the extensor mechanism, posterior capsule, medial and lateral gutters and subcutaneous tissues.  All extruded cement is removed and once the cement is hard the permanent 10 mm posterior stabilized fixed bearing insert is placed into the tibial tray.      The wound is copiously irrigated with saline solution and the extensor mechanism closed with # 0 Stratofix suture. The tourniquet is released for a total tourniquet time of 46  minutes. Flexion against gravity is 140 degrees and the patella tracks normally. Subcutaneous tissue is closed with 2.0 vicryl and subcuticular with running 4.0 Monocryl. The incision is cleaned and dried and steri-strips and a bulky sterile dressing are applied. The limb is placed into a knee immobilizer and the patient is awakened and transported to recovery in stable condition.      Please note that a surgical assistant was a medical necessity for this procedure in order to perform it in a safe and expeditious manner. Surgical assistant was necessary to retract the ligaments and vital neurovascular structures to prevent injury to them and also necessary for proper positioning of the limb to allow for anatomic placement of the prosthesis.   Dempsey ROCKFORD Romey Cohea, MD    10/16/2024, 10:25 AM

## 2024-10-16 NOTE — Evaluation (Signed)
 Physical Therapy Evaluation Patient Details Name: Dawn Jordan MRN: [id number] DOB: 04-04-1966 Today's Date: 10/16/2024  History of Present Illness  58 yo female presents to therapy s/p L TKA on 10/16/2024 due to failure of conservative measures. Pt PMH includes but is not limited to: GAD, PTSD, A-fib with RVR, hypotension, HLD, OA, myofascial pain dysfunction syndrome, asthma, angina, GERD, OSA, RA, fibromyalgia, ADD, and R TKA (2022).  Clinical Impression    Dawn Jordan is a 58 y.o. female  POD 0 s/p L TKA. Patient reports IND with mobility at baseline. Patient is now limited by functional impairments (see PT problem list below) and requires CGA for bed mobility and CGA for transfers. Patient was able to ambulate 45 feet with RW and CGA level of assist. Patient instructed in exercise to facilitate ROM and circulation to manage edema. Patient will benefit from continued skilled PT interventions to address impairments and progress towards PLOF. Acute PT will follow to progress mobility and stair training in preparation for safe discharge home with family support and OPPT services.       If plan is discharge home, recommend the following: A little help with walking and/or transfers;A little help with bathing/dressing/bathroom;Assistance with cooking/housework;Help with stairs or ramp for entrance;Assist for transportation   Can travel by private vehicle        Equipment Recommendations None recommended by PT  Recommendations for Other Services       Functional Status Assessment Patient has had a recent decline in their functional status and demonstrates the ability to make significant improvements in function in a reasonable and predictable amount of time.     Precautions / Restrictions Precautions Precautions: Knee;Fall Restrictions Weight Bearing Restrictions Per Provider Order: No      Mobility  Bed Mobility Overal bed mobility: Needs Assistance Bed Mobility: Supine to Sit      Supine to sit: Contact guard, HOB elevated, Used rails     General bed mobility comments: min cues    Transfers Overall transfer level: Needs assistance Equipment used: Rolling walker (2 wheels) Transfers: Sit to/from Stand Sit to Stand: Contact guard assist           General transfer comment: min cues  and pull to stand    Ambulation/Gait Ambulation/Gait assistance: Contact guard assist Gait Distance (Feet): 45 Feet Assistive device: Rolling walker (2 wheels) Gait Pattern/deviations: Step-to pattern, Decreased stance time - left, Antalgic, Trunk flexed Gait velocity: decreased     General Gait Details: slight trunk flexion with B UE support at RW with min cues for safety and posture  Stairs            Wheelchair Mobility     Tilt Bed    Modified Rankin (Stroke Patients Only)       Balance Overall balance assessment: Needs assistance Sitting-balance support: Feet supported Sitting balance-Leahy Scale: Good     Standing balance support: Bilateral upper extremity supported, During functional activity, Reliant on assistive device for balance Standing balance-Leahy Scale: Poor                               Pertinent Vitals/Pain Pain Assessment Pain Assessment: 0-10 Pain Score: 9  Pain Location: L knee and LE Pain Descriptors / Indicators: Aching, Constant, Discomfort, Grimacing, Moaning, Operative site guarding Pain Intervention(s): Limited activity within patient's tolerance, Monitored during session, Premedicated before session, Repositioned, Ice applied, Patient requesting pain meds-RN notified    Home Living Family/patient  expects to be discharged to:: Other (Comment) (pt is staying at an [employer] B and B with friend prior to returning to [facility]) Living Arrangements: Spouse/significant other                 Additional Comments: [facility] B and B has 3 steps to enter with B handrails, pt typically resides in an apartment no steps to  enter    Prior Function Prior Level of Function : Independent/Modified Independent;Driving             Mobility Comments: IND no AD for all ADLs, self care tasks and IADLs pt has 2 children 2 and 4       Extremity/Trunk Assessment             Cervical / Trunk Assessment Cervical / Trunk Assessment: Normal  Communication   Communication Communication: No apparent difficulties    Cognition Arousal: Alert Behavior During Therapy: WFL for tasks assessed/performed   PT - Cognitive impairments: No apparent impairments                         Following commands: Intact       Cueing       General Comments      Exercises Total Joint Exercises Ankle Circles/Pumps: AROM, Both, 10 reps   Assessment/Plan    PT Assessment Patient needs continued PT services  PT Problem List Decreased strength;Decreased range of motion;Decreased activity tolerance;Decreased balance;Decreased mobility;Decreased coordination;Pain       PT Treatment Interventions DME instruction;Gait training;Stair training;Functional mobility training;Therapeutic activities;Therapeutic exercise;Balance training;Neuromuscular re-education;Patient/family education;Modalities    PT Goals (Current goals can be found in the Care Plan section)  Acute Rehab PT Goals Patient Stated Goal: to get stronger, walk no pain and uneven surfaces PT Goal Formulation: With patient Time For Goal Achievement: 10/30/24 Potential to Achieve Goals: Good    Frequency 7X/week     Co-evaluation               AM-PAC PT 6 Clicks Mobility  Outcome Measure Help needed turning from your back to your side while in a flat bed without using bedrails?: None Help needed moving from lying on your back to sitting on the side of a flat bed without using bedrails?: A Little Help needed moving to and from a bed to a chair (including a wheelchair)?: A Little Help needed standing up from a chair using your arms (e.g.,  wheelchair or bedside chair)?: A Little Help needed to walk in hospital room?: A Little Help needed climbing 3-5 steps with a railing? : A Lot 6 Click Score: 18    End of Session Equipment Utilized During Treatment: Gait belt Activity Tolerance: Patient tolerated treatment well;Patient limited by pain Patient left: in chair;with call bell/phone within reach;with chair alarm set Nurse Communication: Mobility status PT Visit Diagnosis: Unsteadiness on feet (R26.81);Difficulty in walking, not elsewhere classified (R26.2);Other abnormalities of gait and mobility (R26.89);Muscle weakness (generalized) (M62.81);Pain Pain - Right/Left: Left Pain - part of body: Knee;Leg    Time: 1530-1550 PT Time Calculation (min) (ACUTE ONLY): 20 min   Charges:   PT Evaluation $PT Eval Low Complexity: 1 Low   PT General Charges $$ ACUTE PT VISIT: 1 Visit         [place], PT Acute Rehab   [place] [name] Drone 10/16/2024, 7:11 PM

## 2024-10-16 NOTE — Transfer of Care (Signed)
 Immediate Anesthesia Transfer of Care Note  Patient: Dawn Jordan  Procedure(s) Performed: ARTHROPLASTY, KNEE, TOTAL (Left: Knee)  Patient Location: PACU  Anesthesia Type:Spinal  Level of Consciousness: drowsy and patient cooperative  Airway & Oxygen Therapy: Patient Spontanous Breathing and Patient connected to face mask oxygen  Post-op Assessment: Report given to RN and Post -op Vital signs reviewed and stable  Post vital signs: Reviewed and stable  Last Vitals:  Vitals Value Taken Time  BP 127/65 10/16/24 10:52  Temp    Pulse 57 10/16/24 10:54  Resp 14 10/16/24 10:54  SpO2 100 % 10/16/24 10:54  Vitals shown include unfiled device data.  Last Pain:  Vitals:   10/16/24 9287  TempSrc: Oral  PainSc: 5          Complications: No notable events documented.

## 2024-10-16 NOTE — Interval H&P Note (Signed)
 History and Physical Interval Note:  10/16/2024 7:00 AM  Dawn Jordan  has presented today for surgery, with the diagnosis of Left knee osteoarthritis.  The various methods of treatment have been discussed with the patient and family. After consideration of risks, benefits and other options for treatment, the patient has consented to  Procedure(s): ARTHROPLASTY, KNEE, TOTAL (Left) as a surgical intervention.  The patient's history has been reviewed, patient examined, no change in status, stable for surgery.  I have reviewed the patient's chart and labs.  Questions were answered to the patient's satisfaction.     Dempsey Pellegrino Kennard

## 2024-10-16 NOTE — Anesthesia Postprocedure Evaluation (Signed)
 Anesthesia Post Note  Patient: Anabia Weatherwax  Procedure(s) Performed: ARTHROPLASTY, KNEE, TOTAL (Left: Knee)     Patient location during evaluation: Nursing Unit Anesthesia Type: Spinal Level of consciousness: awake, awake and alert and oriented Pain management: pain level controlled Vital Signs Assessment: post-procedure vital signs reviewed and stable Respiratory status: spontaneous breathing, nonlabored ventilation and respiratory function stable Cardiovascular status: blood pressure returned to baseline and stable Postop Assessment: no headache, no backache, spinal receding and no apparent nausea or vomiting Anesthetic complications: no   No notable events documented.  Last Vitals:  Vitals:   10/16/24 1254 10/16/24 1426  BP: 134/74   Pulse: (!) 58   Resp: 12   Temp:    SpO2: 100% 100%    Last Pain:  Vitals:   10/16/24 1645  TempSrc:   PainSc: 10-Worst pain ever                 Garnette FORBES Skillern

## 2024-10-16 NOTE — Anesthesia Procedure Notes (Signed)
 Procedure Name: MAC Date/Time: 10/16/2024 9:16 AM  Performed by: Obadiah Reyes BROCKS, CRNAPre-anesthesia Checklist: Patient identified, Emergency Drugs available, Suction available, Patient being monitored and Timeout performed Patient Re-evaluated:Patient Re-evaluated prior to induction Oxygen Delivery Method: Simple face mask Preoxygenation: Pre-oxygenation with 100% oxygen Induction Type: IV induction Airway Equipment and Method: Oral airway

## 2024-10-17 ENCOUNTER — Encounter (HOSPITAL_COMMUNITY): Payer: Self-pay | Admitting: Orthopedic Surgery

## 2024-10-17 ENCOUNTER — Other Ambulatory Visit (HOSPITAL_COMMUNITY): Payer: Self-pay

## 2024-10-17 DIAGNOSIS — M1712 Unilateral primary osteoarthritis, left knee: Secondary | ICD-10-CM | POA: Diagnosis not present

## 2024-10-17 LAB — BASIC METABOLIC PANEL WITH GFR
Anion gap: 5 (ref 5–15)
BUN: 21 mg/dL — ABNORMAL HIGH (ref 6–20)
CO2: 28 mmol/L (ref 22–32)
Calcium: 8.8 mg/dL — ABNORMAL LOW (ref 8.9–10.3)
Chloride: 108 mmol/L (ref 98–111)
Creatinine, Ser: 0.97 mg/dL (ref 0.44–1.00)
GFR, Estimated: >60 mL/min (ref >60)
Glucose, Bld: 140 mg/dL — ABNORMAL HIGH (ref 70–99)
Potassium: 4.6 mmol/L (ref 3.5–5.1)
Sodium: 141 mmol/L (ref 135–145)

## 2024-10-17 LAB — CBC
HCT: 33.6 % — ABNORMAL LOW (ref 36.0–46.0)
Hemoglobin: 10.9 g/dL — ABNORMAL LOW (ref 12.0–15.0)
MCH: 30.1 pg (ref 26.0–34.0)
MCHC: 32.4 g/dL (ref 30.0–36.0)
MCV: 92.8 fL (ref 80.0–100.0)
Platelets: 157 K/uL (ref 150–400)
RBC: 3.62 MIL/uL — ABNORMAL LOW (ref 3.87–5.11)
RDW: 12.5 % (ref 11.5–15.5)
WBC: 8.5 K/uL (ref 4.0–10.5)
nRBC: 0 % (ref 0.0–0.2)

## 2024-10-17 MED ORDER — ONDANSETRON HCL 4 MG PO TABS
4.0000 mg | ORAL_TABLET | Freq: Four times a day (QID) | ORAL | 0 refills | Status: AC | PRN
  Filled 2024-10-17: qty 20, 5d supply, fill #0

## 2024-10-17 MED ORDER — HYDROMORPHONE HCL 2 MG PO TABS
2.0000 mg | ORAL_TABLET | ORAL | 0 refills | Status: AC | PRN
  Filled 2024-10-17: qty 42, 7d supply, fill #0

## 2024-10-17 MED ORDER — METHOCARBAMOL 500 MG PO TABS
500.0000 mg | ORAL_TABLET | Freq: Four times a day (QID) | ORAL | 0 refills | Status: AC | PRN
  Filled 2024-10-17: qty 40, 10d supply, fill #0

## 2024-10-17 MED ORDER — HYDROMORPHONE HCL 2 MG PO TABS
2.0000 mg | ORAL_TABLET | ORAL | Status: DC | PRN
  Administered 2024-10-18: 2 mg via ORAL
  Filled 2024-10-17: qty 1

## 2024-10-17 MED ORDER — RIVAROXABAN 10 MG PO TABS
10.0000 mg | ORAL_TABLET | Freq: Every day | ORAL | 0 refills | Status: AC
  Filled 2024-10-17: qty 20, 20d supply, fill #0

## 2024-10-17 MED ORDER — HYDROMORPHONE HCL 2 MG PO TABS
4.0000 mg | ORAL_TABLET | ORAL | Status: DC | PRN
  Administered 2024-10-17: 6 mg via ORAL
  Administered 2024-10-17: 4 mg via ORAL
  Administered 2024-10-17 (×2): 6 mg via ORAL
  Administered 2024-10-18: 4 mg via ORAL
  Administered 2024-10-18: 6 mg via ORAL
  Filled 2024-10-17 (×3): qty 3
  Filled 2024-10-17: qty 2
  Filled 2024-10-17 (×2): qty 3

## 2024-10-17 MED ORDER — ALBUTEROL SULFATE (2.5 MG/3ML) 0.083% IN NEBU: 2.5000 mg | INHALATION_SOLUTION | Freq: Four times a day (QID) | RESPIRATORY_TRACT | Status: DC | PRN

## 2024-10-17 NOTE — Progress Notes (Signed)
 Physical Therapy Treatment Patient Details Name: Dawn Jordan MRN: [id number] DOB: [birth year] Today's Date: 10/17/2024   History of Present Illness 58 yo female presents to therapy s/p L TKA on 10/16/2024 due to failure of conservative measures. Pt PMH includes but is not limited to: GAD, PTSD, A-fib with RVR, hypotension, HLD, OA, myofascial pain dysfunction syndrome, asthma, angina, GERD, OSA, RA, fibromyalgia, ADD, and R TKA (2022).    PT Comments   Dawn Jordan is a 58 y.o. female POD 1 s/p L TKA. Patient reports Mod I with mobility at baseline. Patient is now limited by functional impairments (see PT problem list below) and requires S for bed mobility and CGA to close S for transfers. Patient was able to ambulate 20 and 15 feet with RW and CGA level of assist. Patient instructed in exercise to facilitate ROM and circulation to manage edema. Pt limited today due to pain. PT returned 1 hr following initial trail for tx session pt agreeable and indicated the pain was down some 6/10 and at end of tx session pt had returned to 8/10. PT communicated with nurse per optimizing pain management in order to limit barriers to progression with pt goals and hospital d/c.  Patient will benefit from continued skilled PT interventions to address impairments and progress towards PLOF. Acute PT will follow to progress mobility and stair training in preparation for safe discharge home with family support and OPPT services.    If plan is discharge home, recommend the following: A little help with walking and/or transfers;A little help with bathing/dressing/bathroom;Assistance with cooking/housework;Help with stairs or ramp for entrance;Assist for transportation   Can travel by private vehicle        Equipment Recommendations  None recommended by PT    Recommendations for Other Services       Precautions / Restrictions Precautions Precautions: Knee;Fall Restrictions Weight Bearing Restrictions Per Provider  Order: No     Mobility  Bed Mobility Overal bed mobility: Needs Assistance Bed Mobility: Supine to Sit     Supine to sit: HOB elevated, Used rails, Supervision     General bed mobility comments: min cues    Transfers Overall transfer level: Needs assistance Equipment used: Rolling walker (2 wheels) Transfers: Sit to/from Stand Sit to Stand: Contact guard assist, Supervision           General transfer comment: min cues for bed, commode and recliner transfers with CGA to close S    Ambulation/Gait Ambulation/Gait assistance: Contact guard assist Gait Distance (Feet): 20 Feet Assistive device: Rolling walker (2 wheels) Gait Pattern/deviations: Step-to pattern, Decreased stance time - left, Antalgic, Trunk flexed Gait velocity: decreased     General Gait Details: slight trunk flexion with B UE support at RW with min cues for safety and posture   Stairs             Wheelchair Mobility     Tilt Bed    Modified Rankin (Stroke Patients Only)       Balance Overall balance assessment: Needs assistance Sitting-balance support: Feet supported Sitting balance-Leahy Scale: Good     Standing balance support: Bilateral upper extremity supported, During functional activity, Reliant on assistive device for balance Standing balance-Leahy Scale: Fair Standing balance comment: static standing no UE support                            Communication Communication Communication: No apparent difficulties  Cognition Arousal: Alert Behavior During Therapy: [facility]  for tasks assessed/performed   PT - Cognitive impairments: No apparent impairments                         Following commands: Intact      Cueing    Exercises Total Joint Exercises Ankle Circles/Pumps: AROM, Both, 10 reps Quad Sets: AROM, Left, 5 reps Short Arc Quad: Left, AAROM, 5 reps Heel Slides: AROM, Left, 5 reps Hip ABduction/ADduction: AROM, Left, 5 reps Straight Leg  Raises: AROM, Left, 5 reps Knee Flexion: AROM, Left, 5 reps, Seated Goniometric ROM: grossly 5-30 in supine, pt limited by pain    General Comments        Pertinent Vitals/Pain Pain Assessment Pain Assessment: 0-10 Pain Score: 8  Pain Location: L knee and LE Pain Descriptors / Indicators: Aching, Constant, Discomfort, Grimacing, Moaning, Operative site guarding Pain Intervention(s): Limited activity within patient's tolerance, Monitored during session, Premedicated before session, Repositioned, Ice applied    Home Living                          Prior Function            PT Goals (current goals can now be found in the care plan section) Acute Rehab PT Goals Patient Stated Goal: to get stronger, walk no pain and uneven surfaces PT Goal Formulation: With patient Time For Goal Achievement: 10/30/24 Potential to Achieve Goals: Good Progress towards PT goals: Progressing toward goals (slow limited by pain)    Frequency    7X/week      PT Plan      Co-evaluation              AM-PAC PT 6 Clicks Mobility   Outcome Measure  Help needed turning from your back to your side while in a flat bed without using bedrails?: None Help needed moving from lying on your back to sitting on the side of a flat bed without using bedrails?: A Little Help needed moving to and from a bed to a chair (including a wheelchair)?: A Little Help needed standing up from a chair using your arms (e.g., wheelchair or bedside chair)?: A Little Help needed to walk in hospital room?: A Little Help needed climbing 3-5 steps with a railing? : A Lot 6 Click Score: 18    End of Session Equipment Utilized During Treatment: Gait belt Activity Tolerance: Patient limited by pain Patient left: in chair;with call bell/phone within reach;with chair alarm set Nurse Communication: Mobility status PT Visit Diagnosis: Unsteadiness on feet (R26.81);Difficulty in walking, not elsewhere classified  (R26.2);Other abnormalities of gait and mobility (R26.89);Muscle weakness (generalized) (M62.81);Pain Pain - Right/Left: Left Pain - part of body: Knee;Leg     Time: 8885-8857 PT Time Calculation (min) (ACUTE ONLY): 28 min  Charges:    $Gait Training: 8-22 mins $Therapeutic Exercise: 8-22 mins PT General Charges $$ ACUTE PT VISIT: 1 Visit                     [place], PT Acute Rehab    [place] [name] Drone 10/17/2024, 1:22 PM

## 2024-10-17 NOTE — TOC Transition Note (Signed)
 Transition of Care [facility]) - Discharge Note   Patient Details  Name: Dawn Jordan MRN: [id number] Date of Birth: 12-01-1966  [facility]) CM/SW Contact:  Alfonse JONELLE Rex, RN Phone Number: 10/17/2024, 1:15 PM   Clinical Narrative:   Met with patient at bedside to review dc therapy and home equipment needs, patient confirmed OPPT- EO then transition to [place], has RW. MOON completed.     Final next level of care: OP Rehab Barriers to Discharge: No Barriers Identified   Patient Goals and CMS Choice Patient states their goals for this hospitalization and ongoing recovery are:: return home          Discharge Placement                       Discharge Plan and Services Additional resources added to the After Visit Summary for                                       Social Drivers of Health (SDOH) Interventions SDOH Screenings   Food Insecurity: No Food Insecurity (10/16/2024)  Housing: Low Risk  (10/16/2024)  Transportation Needs: No Transportation Needs (10/16/2024)  Utilities: Not At Risk (10/16/2024)  Alcohol Screen: Low Risk  (05/31/2023)  Depression (PHQ2-9): Low Risk  (08/08/2024)  Financial Resource Strain: Low Risk  (10/06/2024)   Received from [facility]  Physical Activity: Insufficiently Active (10/06/2024)   Received from [facility]  Social Connections: Moderately Integrated (10/06/2024)   Received from [facility]  Stress: No Stress Concern Present (10/06/2024)   Received from [facility]  Tobacco Use: Low Risk  (10/16/2024)     Readmission Risk Interventions     No data to display

## 2024-10-17 NOTE — Progress Notes (Signed)
 Physical Therapy Treatment Patient Details Name: Dawn Jordan MRN: [id number] DOB: 1966/10/28 Today's Date: 10/17/2024   History of Present Illness 58 yo female presents to therapy s/p L TKA on 10/16/2024 due to failure of conservative measures. Pt PMH includes but is not limited to: GAD, PTSD, A-fib with RVR, hypotension, HLD, OA, myofascial pain dysfunction syndrome, asthma, angina, GERD, OSA, RA, fibromyalgia, ADD, and R TKA (2022).    PT Comments  PM session for POD#1 Pt admitted with above diagnosis.  Pt currently with functional limitations due to the deficits listed below (see PT Problem List). Pt seated in recliner when PT arrived. PT coordinated with nursing staff to maximize benefits of pharmaceuticals for pain management with pt administered 6 mg of dilaudid  1 hr prior to PT intervention with minimal relief. Pt required CGA to close S for functional transfer tasks from a variety of surfaces with minimal cues and RW, gait tasks in hallway with RW, min cues, standing therapeutic rest breaks for 120 x 2, step navigation x 3 with B handrail and CGA with cues for safety, sequencing and step to technique. Pt elected to return to bed and required min A for L LE. Pt, nurse and PT communication per pt readiness from functional mobility status for d/c however pain is not managed at this time, nurse reached out to PA for further recommendations. Pt in bed, all needs in place, CP on L knee and nurse present.    Pt will benefit from acute skilled PT to increase their independence and safety with mobility to allow discharge.      If plan is discharge home, recommend the following: A little help with walking and/or transfers;A little help with bathing/dressing/bathroom;Assistance with cooking/housework;Help with stairs or ramp for entrance;Assist for transportation   Can travel by private vehicle        Equipment Recommendations  None recommended by PT    Recommendations for Other Services        Precautions / Restrictions Precautions Precautions: Knee;Fall Restrictions Weight Bearing Restrictions Per Provider Order: No     Mobility  Bed Mobility Overal bed mobility: Needs Assistance Bed Mobility: Sit to Supine     Supine to sit: HOB elevated, Used rails, Supervision Sit to supine: Min assist   General bed mobility comments: min cues, min A for LLE with sit to supine ed provided on use of gait belt as a leg lifter if needed once d/c    Transfers Overall transfer level: Needs assistance Equipment used: Rolling walker (2 wheels) Transfers: Sit to/from Stand Sit to Stand: Contact guard assist, Supervision           General transfer comment: min cues for bed, commode, mat table and recliner transfers with CGA to close S    Ambulation/Gait Ambulation/Gait assistance: Contact guard assist Gait Distance (Feet): 120 Feet Assistive device: Rolling walker (2 wheels) Gait Pattern/deviations: Step-to pattern, Decreased stance time - left, Antalgic, Trunk flexed Gait velocity: decreased     General Gait Details: slight trunk flexion with B UE support at RW with min cues for safety and posture, pt requiring standing theraputic rest breaks during gait bout   Stairs             Wheelchair Mobility     Tilt Bed    Modified Rankin (Stroke Patients Only)       Balance Overall balance assessment: Needs assistance Sitting-balance support: Feet supported Sitting balance-Leahy Scale: Good     Standing balance support: Bilateral upper extremity supported,  During functional activity, Reliant on assistive device for balance Standing balance-Leahy Scale: Fair Standing balance comment: static standing no UE support                            Communication Communication Communication: No apparent difficulties  Cognition Arousal: Alert Behavior During Therapy: WFL for tasks assessed/performed   PT - Cognitive impairments: No apparent impairments                          Following commands: Intact      Cueing    Exercises Total Joint Exercises Ankle Circles/Pumps: AROM, Both, 10 reps Quad Sets: AROM, Left, 5 reps Short Arc Quad: Left, AAROM, 5 reps Heel Slides: AROM, Left, 5 reps Hip ABduction/ADduction: AROM, Left, 5 reps Straight Leg Raises: AROM, Left, 5 reps Knee Flexion: AROM, Left, 5 reps, Seated Goniometric ROM: grossly 5-35 degrees in supine AROM    General Comments        Pertinent Vitals/Pain Pain Assessment Pain Assessment: 0-10 Pain Score: 9  Pain Location: L knee and LE Pain Descriptors / Indicators: Aching, Constant, Discomfort, Grimacing, Moaning, Operative site guarding Pain Intervention(s): Limited activity within patient's tolerance, Monitored during session, Premedicated before session, Repositioned, Ice applied (pt reported having 6/10 pain at rest and pain increased with mobility tasks)    Home Living                          Prior Function            PT Goals (current goals can now be found in the care plan section) Acute Rehab PT Goals Patient Stated Goal: to get stronger, walk no pain and uneven surfaces PT Goal Formulation: With patient Time For Goal Achievement: 10/30/24 Potential to Achieve Goals: Good Progress towards PT goals: Progressing toward goals (limited due to pain report)    Frequency    7X/week      PT Plan      Co-evaluation              AM-PAC PT 6 Clicks Mobility   Outcome Measure  Help needed turning from your back to your side while in a flat bed without using bedrails?: None Help needed moving from lying on your back to sitting on the side of a flat bed without using bedrails?: A Little Help needed moving to and from a bed to a chair (including a wheelchair)?: A Little Help needed standing up from a chair using your arms (e.g., wheelchair or bedside chair)?: A Little Help needed to walk in hospital room?: A Little Help needed  climbing 3-5 steps with a railing? : A Little 6 Click Score: 19    End of Session Equipment Utilized During Treatment: Gait belt Activity Tolerance: Patient limited by pain Patient left: with call bell/phone within reach;in bed;with nursing/sitter in room Nurse Communication: Mobility status PT Visit Diagnosis: Unsteadiness on feet (R26.81);Difficulty in walking, not elsewhere classified (R26.2);Other abnormalities of gait and mobility (R26.89);Muscle weakness (generalized) (M62.81);Pain Pain - Right/Left: Left Pain - part of body: Knee;Leg     Time: 8491-8459 PT Time Calculation (min) (ACUTE ONLY): 32 min  Charges:    $Gait Training: 8-22 mins $Therapeutic Exercise: 8-22 mins $Therapeutic Activity: 8-22 mins PT General Charges $$ ACUTE PT VISIT: 1 Visit  [place], PT Acute Rehab    [place] [name] Drone 10/17/2024, 3:45 PM

## 2024-10-17 NOTE — Plan of Care (Signed)
  Problem: Education: Goal: Knowledge of General Education information will improve Description: Including pain rating scale, medication(s)/side effects and non-pharmacologic comfort measures Outcome: Adequate for Discharge   Problem: Health Behavior/Discharge Planning: Goal: Ability to manage health-related needs will improve Outcome: Adequate for Discharge   Problem: Clinical Measurements: Goal: Ability to maintain clinical measurements within normal limits will improve Outcome: Progressing Goal: Will remain free from infection Outcome: Progressing Goal: Diagnostic test results will improve Outcome: Progressing Goal: Respiratory complications will improve Outcome: Progressing Goal: Cardiovascular complication will be avoided Outcome: Progressing   Problem: Activity: Goal: Risk for activity intolerance will decrease Outcome: Adequate for Discharge   Problem: Coping: Goal: Level of anxiety will decrease Outcome: Progressing   Problem: Elimination: Goal: Will not experience complications related to bowel motility Outcome: Progressing Goal: Will not experience complications related to urinary retention Outcome: Completed/Met   Problem: Pain Managment: Goal: General experience of comfort will improve and/or be controlled Outcome: Progressing   Problem: Safety: Goal: Ability to remain free from injury will improve Outcome: Progressing   Problem: Skin Integrity: Goal: Risk for impaired skin integrity will decrease Outcome: Adequate for Discharge   Problem: Education: Goal: Knowledge of the prescribed therapeutic regimen will improve Outcome: Adequate for Discharge   Problem: Activity: Goal: Ability to avoid complications of mobility impairment will improve Outcome: Progressing Goal: Range of joint motion will improve Outcome: Adequate for Discharge   Problem: Clinical Measurements: Goal: Postoperative complications will be avoided or minimized Outcome:  Progressing   Problem: Pain Management: Goal: Pain level will decrease with appropriate interventions Outcome: Progressing   Problem: Skin Integrity: Goal: Will show signs of wound healing Outcome: Progressing

## 2024-10-17 NOTE — Progress Notes (Signed)
 PT Cancellation Note  Patient Details Name: Dawn Jordan MRN: [id number] DOB: [birth year]   Cancelled Treatment:    Reason Eval/Treat Not Completed: Pain limiting ability to participate. PT arrived 1014 and pt reported just having something for pain, rating L knee pain at 8/10 requesting PT return later. PT to return as schedule allows and continue to follow acutely.   [place], PT Acute Rehab   [place] [name] Drone 10/17/2024, 10:18 AM

## 2024-10-17 NOTE — Care Management Obs Status (Signed)
 MEDICARE OBSERVATION STATUS NOTIFICATION   Patient Details  Name: Moni Rothrock MRN: [id number] Date of Birth: July 16, 1966   Medicare Observation Status Notification Given:       Alfonse JONELLE Rex, RN 10/17/2024, 10:14 AM

## 2024-10-17 NOTE — Plan of Care (Signed)
  Problem: Education: Goal: Knowledge of General Education information will improve Description: Including pain rating scale, medication(s)/side effects and non-pharmacologic comfort measures Outcome: Progressing   Problem: Health Behavior/Discharge Planning: Goal: Ability to manage health-related needs will improve Outcome: Adequate for Discharge   Problem: Clinical Measurements: Goal: Ability to maintain clinical measurements within normal limits will improve Outcome: Progressing Goal: Will remain free from infection Outcome: Progressing Goal: Respiratory complications will improve Outcome: Progressing Goal: Cardiovascular complication will be avoided Outcome: Progressing   Problem: Activity: Goal: Risk for activity intolerance will decrease Outcome: Adequate for Discharge   Problem: Nutrition: Goal: Adequate nutrition will be maintained Outcome: Completed/Met   Problem: Coping: Goal: Level of anxiety will decrease Outcome: Progressing   Problem: Elimination: Goal: Will not experience complications related to bowel motility Outcome: Progressing Goal: Will not experience complications related to urinary retention Outcome: Progressing   Problem: Pain Managment: Goal: General experience of comfort will improve and/or be controlled Outcome: Progressing   Problem: Safety: Goal: Ability to remain free from injury will improve Outcome: Progressing   Problem: Skin Integrity: Goal: Risk for impaired skin integrity will decrease Outcome: Progressing   Problem: Education: Goal: Knowledge of the prescribed therapeutic regimen will improve Outcome: Progressing Goal: Individualized Educational Video(s) Outcome: Completed/Met   Problem: Activity: Goal: Ability to avoid complications of mobility impairment will improve Outcome: Progressing Goal: Range of joint motion will improve Outcome: Adequate for Discharge   Problem: Clinical Measurements: Goal: Postoperative  complications will be avoided or minimized Outcome: Progressing   Problem: Pain Management: Goal: Pain level will decrease with appropriate interventions Outcome: Progressing   Problem: Skin Integrity: Goal: Will show signs of wound healing Outcome: Progressing

## 2024-10-17 NOTE — Progress Notes (Signed)
 Subjective: 1 Day Post-Op Procedure(s) (LRB): ARTHROPLASTY, KNEE, TOTAL (Left) Patient reports pain as moderate.   Patient seen in rounds by Dr. Melodi. Patient had issues with pain control yesterday, increased dilaudid  dosage with improvement.  Denies chest pain, SOB, or calf pain. Foley catheter removed this AM.  We will continue therapy today, ambulated 45' yesterday.   Objective: Vital signs in last 24 hours: Temp:  [96.8 F (36 C)-97.8 F (36.6 C)] 97.7 F (36.5 C) (11/11 0528) Pulse Rate:  [48-73] 73 (11/11 0528) Resp:  [12-19] 18 (11/11 0528) BP: (103-134)/(60-95) 113/72 (11/11 0528) SpO2:  [92 %-100 %] 100 % (11/11 0528)  Intake/Output from previous day:  Intake/Output Summary (Last 24 hours) at 10/17/2024 0756 Last data filed at 10/17/2024 0600 Gross per 24 hour  Intake 2708.71 ml  Output 1350 ml  Net 1358.71 ml     Intake/Output this shift: No intake/output data recorded.  Labs: Recent Labs    10/17/24 0402  HGB 10.9*   Recent Labs    10/17/24 0402  WBC 8.5  RBC 3.62*  HCT 33.6*  PLT 157   Recent Labs    10/17/24 0402  NA 141  K 4.6  CL 108  CO2 28  BUN 21*  CREATININE 0.97  GLUCOSE 140*  CALCIUM 8.8*   No results for input(s): LABPT, INR in the last 72 hours.  Exam: General - Patient is Alert and Oriented Extremity - Neurologically intact Neurovascular intact Sensation intact distally Dorsiflexion/Plantar flexion intact Dressing - dressing C/D/I Motor Function - intact, moving foot and toes well on exam.   Past Medical History:  Diagnosis Date   A-fib (HCC)    ADD (attention deficit disorder)    Adjustment disorder with anxious mood 11/17/2023   Anemia    Anginal pain    Anxiety    Anxiety and depression 09/27/2023   Asthma    Back pain    Bone spur of foot    Chest pain    Chronic fatigue syndrome    Complication of anesthesia    woke up during sugery once, and also with epidural at surgery [facility] on [place]  had episode with epidural   Depression    Dyspnea    Dysrhythmia    AFIB   Fibromyalgia    GERD (gastroesophageal reflux disease)    Headache    History of gout    History of kidney stones    Hypertension    IBS (irritable bowel syndrome)    Joint pain    Left ankle pain    Left sciatic nerve pain    Major depressive disorder, single episode, moderate (HCC) 03/09/2017   Outpatient: Dx. ADHD, bipolar, anxiety diagnosed many years ago (reports evaluation of ADHD in 2011, recently by Dr. Corina)  Psychiatry admission: once in 1992 for depression, hypersomnia,  Previous suicide attempt: denies, SIB of making abrasion on her hand, last in 1990's  Past trials of medication: sertraline , Paxil, fluoxetine, citalopram, clonazepam , carbamazepine, Strattera , Adderall,    MDD (major depressive disorder), recurrent episode, moderate (HCC) 12/15/2022   Myofascial pain    Neck pain    OSA (obstructive sleep apnea)    no cpap at this time   Palpitations    Pre-diabetes    PTSD (post-traumatic stress disorder) 09/21/2016   Rheumatoid arthritis (HCC)    Seasonal allergies     Assessment/Plan: 1 Day Post-Op Procedure(s) (LRB): ARTHROPLASTY, KNEE, TOTAL (Left) Principal Problem:   OA (osteoarthritis) of knee Active Problems:  Primary osteoarthritis of left knee  Estimated body mass index is 38.22 kg/m as calculated from the following:   Height as of this encounter: 5' 4 (1.626 m).   Weight as of this encounter: 101 kg. Advance diet Up with therapy D/C IV fluids   Patient's anticipated LOS is less than 2 midnights, meeting these requirements:  - Lives within 1 hour of care - Has a competent adult at home to recover with post-op recover - NO history of  - Chronic pain requiring opioids  - Diabetes  - Coronary Artery Disease  - Heart failure  - Heart attack  - Stroke  - DVT/VTE  - Respiratory Failure/COPD  - Renal failure  - Anemia  - Advanced Liver disease   DVT  Prophylaxis - Xarelto  Weight bearing as tolerated. Continue therapy.  Plan is to go Home after hospital stay. Plan for discharge later today if progresses with therapy and meeting goals. Scheduled for OPPT at [facility] x 1 week, then will transition to [facility] in [place] where patient lives. Follow-up in the office in 2 weeks.  The PDMP database was reviewed today prior to any opioid medications being prescribed to this patient.  [name], PA-C Orthopedic Surgery [phone] 10/17/2024, 7:56 AM

## 2024-10-18 ENCOUNTER — Other Ambulatory Visit (HOSPITAL_COMMUNITY): Payer: Self-pay

## 2024-10-18 DIAGNOSIS — M1712 Unilateral primary osteoarthritis, left knee: Secondary | ICD-10-CM | POA: Diagnosis not present

## 2024-10-18 LAB — CBC
HCT: 33.1 % — ABNORMAL LOW (ref 36.0–46.0)
Hemoglobin: 11.2 g/dL — ABNORMAL LOW (ref 12.0–15.0)
MCH: 30.4 pg (ref 26.0–34.0)
MCHC: 33.8 g/dL (ref 30.0–36.0)
MCV: 89.9 fL (ref 80.0–100.0)
Platelets: 206 K/uL (ref 150–400)
RBC: 3.68 MIL/uL — ABNORMAL LOW (ref 3.87–5.11)
RDW: 12.3 % (ref 11.5–15.5)
WBC: 17.9 K/uL — ABNORMAL HIGH (ref 4.0–10.5)
nRBC: 0 % (ref 0.0–0.2)

## 2024-10-18 NOTE — Progress Notes (Signed)
 Physical Therapy Treatment Patient Details Name: Dawn Jordan MRN: [id number] DOB: 09/23/1966 Today's Date: 10/18/2024   History of Present Illness 58 yo female presents to therapy s/p L TKA on 10/16/2024 due to failure of conservative measures. Pt PMH includes but is not limited to: GAD, PTSD, A-fib with RVR, hypotension, HLD, OA, myofascial pain dysfunction syndrome, asthma, angina, GERD, OSA, RA, fibromyalgia, ADD, and R TKA (2022).    PT Comments   Dawn Jordan is a 57 y.o. female POD 2 s/p L TKA. Patient reports INd with mobility at baseline. Patient is now limited by functional impairments (see PT problem list below) and requires S for transfers and gait with RW. Patient was able to ambulate 125 feet x2 with RW and S and cues for safe walker management. Patient educated on safe sequencing for stair mobility, fall risk prevention, use of RW, CP and ice, pain management and goal, use of gait belt as leg lifter as well as slowly increasing activity levels pt verbalized understanding.  Patient instructed in exercises to facilitate ROM and circulation reviewed and HO provided. Patient will benefit from continued skilled PT interventions to address impairments and progress towards PLOF. Patient has met mobility goals at adequate level for discharge home with family and social support and OPPT services scheduled for 11/13; will continue to follow if pt continues acute stay to progress towards Mod I goals.    If plan is discharge home, recommend the following: A little help with walking and/or transfers;A little help with bathing/dressing/bathroom;Assistance with cooking/housework;Help with stairs or ramp for entrance;Assist for transportation   Can travel by private vehicle        Equipment Recommendations  None recommended by PT    Recommendations for Other Services       Precautions / Restrictions Precautions Precautions: Knee;Fall Restrictions Weight Bearing Restrictions Per Provider  Order: No     Mobility  Bed Mobility Overal bed mobility: Needs Assistance Bed Mobility: Sit to Supine     Supine to sit: HOB elevated, Used rails, Supervision     General bed mobility comments: min cues and pt able to perform return demonstration of use of gait belt as leg lifter    Transfers Overall transfer level: Needs assistance Equipment used: Rolling walker (2 wheels) Transfers: Sit to/from Stand Sit to Stand: Supervision           General transfer comment: min cues    Ambulation/Gait Ambulation/Gait assistance: Supervision Gait Distance (Feet): 125 Feet Assistive device: Rolling walker (2 wheels) Gait Pattern/deviations: Step-to pattern, Decreased stance time - left, Antalgic, Trunk flexed Gait velocity: decreased     General Gait Details: slight trunk flexion with B UE support at RW with min cues for safety and posture   Stairs Stairs: Yes Stairs assistance: Supervision Stair Management: Two rails Number of Stairs: 3 General stair comments: min cues for safety and sequencing   Wheelchair Mobility     Tilt Bed    Modified Rankin (Stroke Patients Only)       Balance Overall balance assessment: Needs assistance Sitting-balance support: Feet supported Sitting balance-Leahy Scale: Good     Standing balance support: Bilateral upper extremity supported, During functional activity, Reliant on assistive device for balance Standing balance-Leahy Scale: Fair Standing balance comment: static standing no UE support                            Communication Communication Communication: No apparent difficulties  Cognition Arousal: Alert  Behavior During Therapy: WFL for tasks assessed/performed   PT - Cognitive impairments: No apparent impairments                         Following commands: Intact      Cueing    Exercises Total Joint Exercises Ankle Circles/Pumps: AROM, Both, 10 reps Quad Sets: AROM, Left, 5 reps Short  Arc Quad: Left, 5 reps, AROM Heel Slides: AROM, Left, 5 reps Hip ABduction/ADduction: AROM, Left, 5 reps Straight Leg Raises: Left, 5 reps, AAROM Knee Flexion: AROM, Left, 5 reps, Seated    General Comments        Pertinent Vitals/Pain Pain Assessment Pain Assessment: 0-10 Pain Score: 5  Pain Location: L knee and LE Pain Descriptors / Indicators: Aching, Constant, Discomfort, Grimacing, Moaning, Operative site guarding Pain Intervention(s): Limited activity within patient's tolerance, Monitored during session, Premedicated before session, Repositioned, Ice applied    Home Living                          Prior Function            PT Goals (current goals can now be found in the care plan section) Acute Rehab PT Goals Patient Stated Goal: to get stronger, walk no pain and uneven surfaces PT Goal Formulation: With patient Time For Goal Achievement: 10/30/24 Potential to Achieve Goals: Good Progress towards PT goals: Progressing toward goals    Frequency    7X/week      PT Plan      Co-evaluation              AM-PAC PT 6 Clicks Mobility   Outcome Measure  Help needed turning from your back to your side while in a flat bed without using bedrails?: None Help needed moving from lying on your back to sitting on the side of a flat bed without using bedrails?: A Little Help needed moving to and from a bed to a chair (including a wheelchair)?: A Little Help needed standing up from a chair using your arms (e.g., wheelchair or bedside chair)?: A Little Help needed to walk in hospital room?: A Little Help needed climbing 3-5 steps with a railing? : A Little 6 Click Score: 19    End of Session Equipment Utilized During Treatment: Gait belt Activity Tolerance: Patient limited by pain Patient left: with call bell/phone within reach;in chair;with chair alarm set Nurse Communication: Mobility status PT Visit Diagnosis: Unsteadiness on feet  (R26.81);Difficulty in walking, not elsewhere classified (R26.2);Other abnormalities of gait and mobility (R26.89);Muscle weakness (generalized) (M62.81);Pain Pain - Right/Left: Left Pain - part of body: Knee;Leg     Time: 8881-8850 PT Time Calculation (min) (ACUTE ONLY): 31 min  Charges:    $Gait Training: 8-22 mins $Therapeutic Exercise: 8-22 mins PT General Charges $$ ACUTE PT VISIT: 1 Visit                     [place], PT Acute Rehab    [place] [name] Drone 10/18/2024, 12:22 PM

## 2024-10-18 NOTE — Plan of Care (Signed)
  Problem: Education: Goal: Knowledge of General Education information will improve Description: Including pain rating scale, medication(s)/side effects and non-pharmacologic comfort measures Outcome: Adequate for Discharge   Problem: Health Behavior/Discharge Planning: Goal: Ability to manage health-related needs will improve Outcome: Adequate for Discharge   Problem: Clinical Measurements: Goal: Ability to maintain clinical measurements within normal limits will improve Outcome: Adequate for Discharge Goal: Will remain free from infection Outcome: Adequate for Discharge Goal: Diagnostic test results will improve Outcome: Adequate for Discharge Goal: Respiratory complications will improve Outcome: Adequate for Discharge Goal: Cardiovascular complication will be avoided Outcome: Adequate for Discharge   Problem: Activity: Goal: Risk for activity intolerance will decrease Outcome: Adequate for Discharge   Problem: Coping: Goal: Level of anxiety will decrease Outcome: Adequate for Discharge   Problem: Elimination: Goal: Will not experience complications related to bowel motility Outcome: Adequate for Discharge   Problem: Pain Managment: Goal: General experience of comfort will improve and/or be controlled Outcome: Adequate for Discharge   Problem: Safety: Goal: Ability to remain free from injury will improve Outcome: Adequate for Discharge   Problem: Skin Integrity: Goal: Risk for impaired skin integrity will decrease Outcome: Adequate for Discharge   Problem: Education: Goal: Knowledge of the prescribed therapeutic regimen will improve Outcome: Adequate for Discharge   Problem: Activity: Goal: Ability to avoid complications of mobility impairment will improve Outcome: Adequate for Discharge Goal: Range of joint motion will improve Outcome: Adequate for Discharge   Problem: Clinical Measurements: Goal: Postoperative complications will be avoided or  minimized Outcome: Adequate for Discharge   Problem: Pain Management: Goal: Pain level will decrease with appropriate interventions Outcome: Adequate for Discharge   Problem: Skin Integrity: Goal: Will show signs of wound healing Outcome: Adequate for Discharge

## 2024-10-18 NOTE — Progress Notes (Signed)
 Discharge medications delivered to patient at the bedside in a secure bag.

## 2024-10-18 NOTE — Progress Notes (Signed)
 Subjective: 2 Days Post-Op Procedure(s) (LRB): ARTHROPLASTY, KNEE, TOTAL (Left) Patient reports pain as mild.   Patient seen in rounds by Dr. Melodi. Patient had issues with pain control yesterday. Improved this morning.  Objective: Vital signs in last 24 hours: Temp:  [97.5 F (36.4 C)-98.9 F (37.2 C)] 98.3 F (36.8 C) (11/12 0533) Pulse Rate:  [66-87] 81 (11/12 0533) Resp:  [16-17] 16 (11/12 0533) BP: (104-140)/(60-95) 140/77 (11/12 0533) SpO2:  [97 %-100 %] 100 % (11/12 0533)  Intake/Output from previous day:  Intake/Output Summary (Last 24 hours) at 10/18/2024 0823 Last data filed at 10/18/2024 0600 Gross per 24 hour  Intake 1000 ml  Output 1300 ml  Net -300 ml    Intake/Output this shift: No intake/output data recorded.  Labs: Recent Labs    10/17/24 0402 10/18/24 0350  HGB 10.9* 11.2*   Recent Labs    10/17/24 0402 10/18/24 0350  WBC 8.5 17.9*  RBC 3.62* 3.68*  HCT 33.6* 33.1*  PLT 157 206   Recent Labs    10/17/24 0402  NA 141  K 4.6  CL 108  CO2 28  BUN 21*  CREATININE 0.97  GLUCOSE 140*  CALCIUM 8.8*   No results for input(s): LABPT, INR in the last 72 hours.  Exam: General - Patient is Alert and Oriented Extremity - Neurologically intact Neurovascular intact Sensation intact distally Dorsiflexion/Plantar flexion intact Dressing/Incision - clean, dry intact Motor Function - intact, moving foot and toes well on exam.   Past Medical History:  Diagnosis Date   A-fib (HCC)    ADD (attention deficit disorder)    Adjustment disorder with anxious mood 11/17/2023   Anemia    Anginal pain    Anxiety    Anxiety and depression 09/27/2023   Asthma    Back pain    Bone spur of foot    Chest pain    Chronic fatigue syndrome    Complication of anesthesia    woke up during sugery once, and also with epidural at [facility] [place] had episode with epidural   Depression    Dyspnea    Dysrhythmia    AFIB    Fibromyalgia    GERD (gastroesophageal reflux disease)    Headache    History of gout    History of kidney stones    Hypertension    IBS (irritable bowel syndrome)    Joint pain    Left ankle pain    Left sciatic nerve pain    Major depressive disorder, single episode, moderate (HCC) 03/09/2017   Outpatient: Dx. ADHD, bipolar, anxiety diagnosed many years ago (reports evaluation of ADHD in 2011, recently by Dr. Corina)  Psychiatry admission: once in 1992 for depression, hypersomnia,  Previous suicide attempt: denies, SIB of making abrasion on her hand, last in 1990's  Past trials of medication: sertraline , Paxil, fluoxetine, citalopram, clonazepam , carbamazepine, Strattera , Adderall,    MDD (major depressive disorder), recurrent episode, moderate (HCC) 12/15/2022   Myofascial pain    Neck pain    OSA (obstructive sleep apnea)    no cpap at this time   Palpitations    Pre-diabetes    PTSD (post-traumatic stress disorder) 09/21/2016   Rheumatoid arthritis (HCC)    Seasonal allergies     Assessment/Plan: 2 Days Post-Op Procedure(s) (LRB): ARTHROPLASTY, KNEE, TOTAL (Left) Principal Problem:   OA (osteoarthritis) of knee Active Problems:   Primary osteoarthritis of left knee  Estimated body mass index is 38.22 kg/m as calculated  from the following:   Height as of this encounter: 5' 4 (1.626 m).   Weight as of this encounter: 101 kg. Up with therapy  DVT Prophylaxis - Xarelto  Weight-bearing as tolerated  Discharge to home once cleared with PT  [name], PA-C Orthopedic Surgery [phone] 10/18/2024, 8:23 AM

## 2024-10-24 ENCOUNTER — Ambulatory Visit (INDEPENDENT_AMBULATORY_CARE_PROVIDER_SITE_OTHER): Admitting: Clinical

## 2024-10-24 ENCOUNTER — Encounter (HOSPITAL_COMMUNITY): Payer: Self-pay | Admitting: Clinical

## 2024-10-24 DIAGNOSIS — F331 Major depressive disorder, recurrent, moderate: Secondary | ICD-10-CM

## 2024-10-24 DIAGNOSIS — F411 Generalized anxiety disorder: Secondary | ICD-10-CM | POA: Diagnosis not present

## 2024-10-24 NOTE — Discharge Summary (Signed)
 Patient ID: Dawn Jordan MRN: [id number] DOB/AGE: 10-Jun-1966 58 y.o.  Admit date: 10/16/2024 Discharge date: 10/18/2024  Admission Diagnoses:  Principal Problem:   OA (osteoarthritis) of knee Active Problems:   Primary osteoarthritis of left knee   Discharge Diagnoses:  Same  Past Medical History:  Diagnosis Date   A-fib [facility])    ADD (attention deficit disorder)    Adjustment disorder with anxious mood 11/17/2023   Anemia    Anginal pain    Anxiety    Anxiety and depression 09/27/2023   Asthma    Back pain    Bone spur of foot    Chest pain    Chronic fatigue syndrome    Complication of anesthesia    woke up during sugery once, and also with epidural at surgery center on [address] had episode with epidural   Depression    Dyspnea    Dysrhythmia    AFIB   Fibromyalgia    GERD (gastroesophageal reflux disease)    Headache    History of gout    History of kidney stones    Hypertension    IBS (irritable bowel syndrome)    Joint pain    Left ankle pain    Left sciatic nerve pain    Major depressive disorder, single episode, moderate (HCC) 03/09/2017   Outpatient: Dx. ADHD, bipolar, anxiety diagnosed many years ago (reports evaluation of ADHD in 2011, recently by Dr. Corina)  Psychiatry admission: once in 1992 for depression, hypersomnia,  Previous suicide attempt: denies, SIB of making abrasion on her hand, last in 1990's  Past trials of medication: sertraline , Paxil, fluoxetine, citalopram, clonazepam , carbamazepine, Strattera , Adderall,    MDD (major depressive disorder), recurrent episode, moderate (HCC) 12/15/2022   Myofascial pain    Neck pain    OSA (obstructive sleep apnea)    no cpap at this time   Palpitations    Pre-diabetes    PTSD (post-traumatic stress disorder) 09/21/2016   Rheumatoid arthritis (HCC)    Seasonal allergies     Surgeries: Procedure(s): ARTHROPLASTY, KNEE, TOTAL on 10/16/2024   Consultants:   Discharged Condition:  Improved  Hospital Course: Dawn Jordan is an 58 y.o. adult who was admitted 10/16/2024 for operative treatment ofOA (osteoarthritis) of knee. Patient has severe unremitting pain that affects sleep, daily activities, and work/hobbies. After pre-op clearance the patient was taken to the operating room on 10/16/2024 and underwent  Procedure(s): ARTHROPLASTY, KNEE, TOTAL.    Patient was given perioperative antibiotics:  Anti-infectives (From admission, onward)    Start     Dose/Rate Route Frequency Ordered Stop   10/16/24 1500  ceFAZolin  (ANCEF ) IVPB 2g/100 mL premix        2 g 200 mL/hr over 30 Minutes Intravenous Every 6 hours 10/16/24 1251 10/16/24 2200   10/16/24 0700  ceFAZolin  (ANCEF ) IVPB 2g/100 mL premix        2 g 200 mL/hr over 30 Minutes Intravenous On call to O.R. 10/16/24 9341 10/16/24 0917        Patient was given sequential compression devices, early ambulation, and chemoprophylaxis to prevent DVT.  Patient benefited maximally from hospital stay and there were no complications.    Recent vital signs: No data found.   Recent laboratory studies: No results for input(s): WBC, HGB, HCT, PLT, NA, K, CL, CO2, BUN, CREATININE, GLUCOSE, INR, CALCIUM in the last 72 hours.  Invalid input(s): PT, 2   Discharge Medications:   Allergies as of 10/18/2024  Reactions   Almond (diagnostic) Anaphylaxis   Egg Protein-containing Drug Products Diarrhea, Other (See Comments)   GI intolerance    Latex Anaphylaxis, Hives, Itching   Anaphylaxis is only when I am in a closed area (ex: car with balloons)   Other Other (See Comments)   Sinus headache from new plastics, carpets, etc.   Banana Other (See Comments)   Mouth burning   Black Walnut Pollen Allergy  Skin Test Other (See Comments)   Mouth burning   Food Other (See Comments)   Dates - Mouth burning   Kiwi Extract Other (See Comments)   Mouth burning   Norco [hydrocodone -acetaminophen ] Itching    OK with Benadryl  premed   Oxycontin  [oxycodone ] Itching   OK with Benadryl  premed   Codeine Other (See Comments)   Headaches    Duloxetine  Hcl Nausea Only   Wound Dressing Adhesive Itching, Rash   Blistering rash        Medication List     STOP taking these medications    Cannabidiol Powd   Dual Action Pain Relief 125-250 MG Tabs Generic drug: Ibuprofen -Acetaminophen    nabumetone  500 MG tablet Commonly known as: RELAFEN    Oxycodone  HCl 10 MG Tabs   SALONPAS PAIN RELIEF PATCH EX       TAKE these medications    acetaminophen  500 MG tablet Commonly known as: TYLENOL  Take 2,000 mg by mouth 2 (two) times daily as needed for moderate pain (pain score 4-6).   Actemra 162 MG/0.9ML Sosy Generic drug: Tocilizumab Inject 162 mg as directed every Monday.   atomoxetine  25 MG capsule Commonly known as: STRATTERA  Take 25 mg by mouth in the morning and at bedtime.   clonazePAM  0.5 MG tablet Commonly known as: KLONOPIN  Take 1 tablet (0.5 mg total) by mouth 3 (three) times daily as needed for anxiety.   diphenhydrAMINE  12.5 MG/5ML elixir Commonly known as: BENADRYL  Take 12.5-25 mg by mouth daily as needed for itching (with oxycodone ).   EPINEPHrine  0.3 mg/0.3 mL Soaj injection Commonly known as: EPI-PEN Use for life threatening allergic reactions   flecainide  50 MG tablet Commonly known as: TAMBOCOR  Take 1 tablet (50 mg total) by mouth every 12 (twelve) hours.   fluticasone  220 MCG/ACT inhaler Commonly known as: Flovent  HFA INHALE TWO PUFFS INTO THE LUNGS EVERY MORNING & AT BEDTIME What changed:  how much to take how to take this when to take this reasons to take this additional instructions   fluticasone  50 MCG/ACT nasal spray Commonly known as: FLONASE  PLACE ONE SPRAY IN EACH NOSTRIL EVERY DAY What changed: See the new instructions.   gabapentin  300 MG capsule Commonly known as: NEURONTIN  Take 300 mg by mouth 2 (two) times daily.   HYDROmorphone  2  MG tablet Commonly known as: DILAUDID  Take 1 tablet (2 mg total) by mouth every 4 (four) hours as needed for severe pain (pain score 7-10).   levalbuterol  45 MCG/ACT inhaler Commonly known as: XOPENEX  HFA INHALE TWO PUFFS INTO THE LUNGS EVERY 6 HOURS AS NEEDED FOR WHEEZING   levocetirizine 5 MG tablet Commonly known as: XYZAL  Take 1 tablet (5 mg total) by mouth every evening.   methocarbamol  500 MG tablet Commonly known as: ROBAXIN  Take 1 tablet (500 mg total) by mouth every 6 (six) hours as needed for muscle spasms.   metoprolol  succinate 25 MG 24 hr tablet Commonly known as: TOPROL -XL Take 0.5 tablets (12.5 mg total) by mouth daily.   nitroGLYCERIN  0.4 MG SL tablet Commonly known as: NITROSTAT  Place 1  tablet (0.4 mg total) under the tongue every 5 (five) minutes as needed for chest pain.   Olopatadine  HCl 0.2 % Soln Place 1 drop into both eyes daily as needed. What changed: reasons to take this   omeprazole  40 MG capsule Commonly known as: PRILOSEC TAKE ONE CAPSULE BY MOUTH EVERY MORNING 30 MINUTES BEFORE MEALS What changed: See the new instructions.   ondansetron  4 MG tablet Commonly known as: ZOFRAN  Take 1 tablet (4 mg total) by mouth every 6 (six) hours as needed for nausea.   sodium chloride  0.65 % Soln nasal spray Commonly known as: OCEAN Place 1 spray into both nostrils as needed for congestion.   triamcinolone  ointment 0.1 % Commonly known as: KENALOG  Apply 1 application topically 2 (two) times daily. What changed:  when to take this reasons to take this   Xarelto  10 MG Tabs tablet Generic drug: rivaroxaban  Take 1 tablet (10 mg total) by mouth daily with breakfast for 20 days. Then take one 81 mg aspirin  once a day for three weeks. Then discontinue aspirin .               Discharge Care Instructions  (From admission, onward)           Start     Ordered   10/17/24 0000  Weight bearing as tolerated        10/17/24 0801   10/17/24 0000   Change dressing       Comments: You may remove the bulky bandage (ACE wrap and gauze) two days after surgery. You will have an adhesive waterproof bandage underneath. Leave this in place until your first follow-up appointment.   10/17/24 0801            Diagnostic Studies: No results found.  Disposition: Discharge disposition: 01-Home or Self Care       Discharge Instructions     Call MD / Call 911   Complete by: As directed    If you experience chest pain or shortness of breath, CALL 911 and be transported to the hospital emergency room.  If you develope a fever above 101 F, pus (white drainage) or increased drainage or redness at the wound, or calf pain, call your surgeon's office.   Change dressing   Complete by: As directed    You may remove the bulky bandage (ACE wrap and gauze) two days after surgery. You will have an adhesive waterproof bandage underneath. Leave this in place until your first follow-up appointment.   Constipation Prevention   Complete by: As directed    Drink plenty of fluids.  Prune juice may be helpful.  You may use a stool softener, such as Colace (over the counter) 100 mg twice a day.  Use MiraLax  (over the counter) for constipation as needed.   Diet - low sodium heart healthy   Complete by: As directed    Do not put a pillow under the knee. Place it under the heel.   Complete by: As directed    Driving restrictions   Complete by: As directed    No driving for two weeks   Post-operative opioid taper instructions:   Complete by: As directed    POST-OPERATIVE OPIOID TAPER INSTRUCTIONS: It is important to wean off of your opioid medication as soon as possible. If you do not need pain medication after your surgery it is ok to stop day one. Opioids include: Codeine, Hydrocodone (Norco, Vicodin), Oxycodone (Percocet, oxycontin ) and hydromorphone  amongst others.  Long term and even short  term use of opiods can cause: Increased pain  response Dependence Constipation Depression Respiratory depression And more.  Withdrawal symptoms can include Flu like symptoms Nausea, vomiting And more Techniques to manage these symptoms Hydrate well Eat regular healthy meals Stay active Use relaxation techniques(deep breathing, meditating, yoga) Do Not substitute Alcohol to help with tapering If you have been on opioids for less than two weeks and do not have pain than it is ok to stop all together.  Plan to wean off of opioids This plan should start within one week post op of your joint replacement. Maintain the same interval or time between taking each dose and first decrease the dose.  Cut the total daily intake of opioids by one tablet each day Next start to increase the time between doses. The last dose that should be eliminated is the evening dose.      TED hose   Complete by: As directed    Use stockings (TED hose) for three weeks on both leg(s).  You may remove them at night for sleeping.   Weight bearing as tolerated   Complete by: As directed         Follow-up Information     Aluisio, Dempsey, MD. Schedule an appointment as soon as possible for a visit in 2 week(s).   Specialty: Orthopedic Surgery Contact information: [address] [place] [address] [phone]                  Signed: [name] 10/24/2024, 8:23 AM
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
## 2024-11-06 ENCOUNTER — Ambulatory Visit (HOSPITAL_COMMUNITY): Admitting: Psychiatry

## 2024-11-29 ENCOUNTER — Encounter (HOSPITAL_COMMUNITY): Payer: Self-pay | Admitting: Clinical

## 2024-11-29 ENCOUNTER — Ambulatory Visit (HOSPITAL_COMMUNITY): Admitting: Clinical

## 2024-11-29 DIAGNOSIS — F331 Major depressive disorder, recurrent, moderate: Secondary | ICD-10-CM | POA: Diagnosis not present

## 2024-11-29 DIAGNOSIS — F4310 Post-traumatic stress disorder, unspecified: Secondary | ICD-10-CM

## 2024-11-29 DIAGNOSIS — F4522 Body dysmorphic disorder: Secondary | ICD-10-CM | POA: Diagnosis not present

## 2024-11-29 DIAGNOSIS — F411 Generalized anxiety disorder: Secondary | ICD-10-CM

## 2024-11-29 DIAGNOSIS — R632 Polyphagia: Secondary | ICD-10-CM | POA: Diagnosis not present

## 2024-11-29 DIAGNOSIS — F900 Attention-deficit hyperactivity disorder, predominantly inattentive type: Secondary | ICD-10-CM | POA: Diagnosis not present
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
## 2024-12-28 ENCOUNTER — Other Ambulatory Visit: Payer: Self-pay

## 2025-01-02 ENCOUNTER — Encounter (HOSPITAL_COMMUNITY): Payer: Self-pay | Admitting: Clinical

## 2025-01-02 ENCOUNTER — Ambulatory Visit (INDEPENDENT_AMBULATORY_CARE_PROVIDER_SITE_OTHER): Admitting: Clinical

## 2025-01-02 DIAGNOSIS — F900 Attention-deficit hyperactivity disorder, predominantly inattentive type: Secondary | ICD-10-CM | POA: Diagnosis not present

## 2025-01-02 DIAGNOSIS — F4522 Body dysmorphic disorder: Secondary | ICD-10-CM | POA: Diagnosis not present

## 2025-01-02 DIAGNOSIS — R632 Polyphagia: Secondary | ICD-10-CM

## 2025-01-02 DIAGNOSIS — F4310 Post-traumatic stress disorder, unspecified: Secondary | ICD-10-CM | POA: Diagnosis not present

## 2025-01-02 DIAGNOSIS — F411 Generalized anxiety disorder: Secondary | ICD-10-CM | POA: Diagnosis not present

## 2025-01-02 DIAGNOSIS — F331 Major depressive disorder, recurrent, moderate: Secondary | ICD-10-CM | POA: Diagnosis not present
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
## 2025-01-04 NOTE — Telephone Encounter (Signed)
 At last CMP on 08/02/24 AST was in Normal Range. ALT was abnormal. Magnesium Overdue  In accordance with refill protocols, please review and address the following requirements before this medication refill can be authorized:  Labs

## 2025-01-08 MED ORDER — FLECAINIDE ACETATE 50 MG PO TABS: 50.0000 mg | ORAL_TABLET | Freq: Two times a day (BID) | ORAL | 0 refills | Status: AC

## 2025-01-19 ENCOUNTER — Ambulatory Visit (HOSPITAL_COMMUNITY): Admitting: Clinical

## 2025-02-02 ENCOUNTER — Ambulatory Visit (HOSPITAL_COMMUNITY): Admitting: Clinical

## 2025-02-16 ENCOUNTER — Ambulatory Visit (HOSPITAL_COMMUNITY): Admitting: Clinical

## 2025-03-02 ENCOUNTER — Ambulatory Visit (HOSPITAL_COMMUNITY): Admitting: Clinical
